# Patient Record
Sex: Female | Born: 1950
Health system: Southern US, Community
[De-identification: ages and names within clinical notes are randomized; demographics above are authoritative.]

## PROBLEM LIST (undated history)

## (undated) DIAGNOSIS — K859 Acute pancreatitis without necrosis or infection, unspecified: Secondary | ICD-10-CM

## (undated) DIAGNOSIS — Z8709 Personal history of other diseases of the respiratory system: Secondary | ICD-10-CM

## (undated) DIAGNOSIS — Z8719 Personal history of other diseases of the digestive system: Secondary | ICD-10-CM

## (undated) DIAGNOSIS — M1990 Unspecified osteoarthritis, unspecified site: Secondary | ICD-10-CM

## (undated) DIAGNOSIS — T7840XA Allergy, unspecified, initial encounter: Secondary | ICD-10-CM

## (undated) DIAGNOSIS — E119 Type 2 diabetes mellitus without complications: Secondary | ICD-10-CM

## (undated) DIAGNOSIS — G4730 Sleep apnea, unspecified: Secondary | ICD-10-CM

## (undated) DIAGNOSIS — W19XXXA Unspecified fall, initial encounter: Secondary | ICD-10-CM

## (undated) DIAGNOSIS — K219 Gastro-esophageal reflux disease without esophagitis: Secondary | ICD-10-CM

## (undated) DIAGNOSIS — M8580 Other specified disorders of bone density and structure, unspecified site: Secondary | ICD-10-CM

## (undated) DIAGNOSIS — F32A Depression, unspecified: Secondary | ICD-10-CM

## (undated) DIAGNOSIS — F329 Major depressive disorder, single episode, unspecified: Secondary | ICD-10-CM

## (undated) DIAGNOSIS — Z8781 Personal history of (healed) traumatic fracture: Secondary | ICD-10-CM

## (undated) DIAGNOSIS — F419 Anxiety disorder, unspecified: Secondary | ICD-10-CM

## (undated) DIAGNOSIS — Z973 Presence of spectacles and contact lenses: Secondary | ICD-10-CM

## (undated) DIAGNOSIS — R296 Repeated falls: Secondary | ICD-10-CM

## (undated) DIAGNOSIS — K635 Polyp of colon: Secondary | ICD-10-CM

## (undated) DIAGNOSIS — E785 Hyperlipidemia, unspecified: Secondary | ICD-10-CM

## (undated) DIAGNOSIS — I10 Essential (primary) hypertension: Secondary | ICD-10-CM

## (undated) DIAGNOSIS — K222 Esophageal obstruction: Secondary | ICD-10-CM

## (undated) DIAGNOSIS — R351 Nocturia: Secondary | ICD-10-CM

## (undated) HISTORY — DX: Sleep apnea, unspecified: G47.30

## (undated) HISTORY — PX: UPPER GI ENDOSCOPY: SHX6162

## (undated) HISTORY — DX: Esophageal obstruction: K22.2

## (undated) HISTORY — DX: Major depressive disorder, single episode, unspecified: F32.9

## (undated) HISTORY — PX: DILATION AND CURETTAGE OF UTERUS: SHX78

## (undated) HISTORY — DX: Anxiety disorder, unspecified: F41.9

## (undated) HISTORY — PX: BLADDER REPAIR: SHX76

## (undated) HISTORY — PX: JOINT REPLACEMENT: SHX530

## (undated) HISTORY — DX: Hyperlipidemia, unspecified: E78.5

## (undated) HISTORY — DX: Type 2 diabetes mellitus without complications: E11.9

## (undated) HISTORY — PX: DIAGNOSTIC LAPAROSCOPY: SUR761

## (undated) HISTORY — PX: COLONOSCOPY: SHX174

## (undated) HISTORY — DX: Depression, unspecified: F32.A

## (undated) HISTORY — DX: Gastro-esophageal reflux disease without esophagitis: K21.9

## (undated) HISTORY — DX: Acute pancreatitis without necrosis or infection, unspecified: K85.90

## (undated) HISTORY — DX: Allergy, unspecified, initial encounter: T78.40XA

## (undated) HISTORY — PX: MULTIPLE TOOTH EXTRACTIONS: SHX2053

## (undated) HISTORY — PX: TUBAL LIGATION: SHX77

## (undated) HISTORY — PX: TONSILLECTOMY: SUR1361

## (undated) HISTORY — DX: Essential (primary) hypertension: I10

## (undated) HISTORY — PX: ESOPHAGEAL DILATION: SHX303

## (undated) HISTORY — DX: Other specified disorders of bone density and structure, unspecified site: M85.80

## (undated) HISTORY — PX: UPPER GASTROINTESTINAL ENDOSCOPY: SHX188

---

## 1998-10-26 ENCOUNTER — Other Ambulatory Visit: Admission: RE | Admit: 1998-10-26 | Discharge: 1998-10-26 | Payer: Self-pay | Admitting: Family Medicine

## 1998-12-01 ENCOUNTER — Encounter: Admission: RE | Admit: 1998-12-01 | Discharge: 1998-12-01 | Payer: Self-pay | Admitting: Family Medicine

## 1998-12-01 ENCOUNTER — Encounter: Payer: Self-pay | Admitting: Family Medicine

## 1999-11-24 ENCOUNTER — Other Ambulatory Visit: Admission: RE | Admit: 1999-11-24 | Discharge: 1999-11-24 | Payer: Self-pay | Admitting: Family Medicine

## 1999-12-29 ENCOUNTER — Encounter: Admission: RE | Admit: 1999-12-29 | Discharge: 1999-12-29 | Payer: Self-pay | Admitting: Family Medicine

## 1999-12-29 ENCOUNTER — Encounter: Payer: Self-pay | Admitting: Family Medicine

## 2000-01-02 DIAGNOSIS — K5730 Diverticulosis of large intestine without perforation or abscess without bleeding: Secondary | ICD-10-CM | POA: Insufficient documentation

## 2001-01-09 ENCOUNTER — Encounter: Payer: Self-pay | Admitting: Family Medicine

## 2001-01-09 ENCOUNTER — Encounter: Admission: RE | Admit: 2001-01-09 | Discharge: 2001-01-09 | Payer: Self-pay | Admitting: Family Medicine

## 2001-12-05 ENCOUNTER — Other Ambulatory Visit: Admission: RE | Admit: 2001-12-05 | Discharge: 2001-12-05 | Payer: Self-pay | Admitting: Family Medicine

## 2002-01-10 ENCOUNTER — Encounter: Payer: Self-pay | Admitting: Family Medicine

## 2002-01-10 ENCOUNTER — Encounter: Admission: RE | Admit: 2002-01-10 | Discharge: 2002-01-10 | Payer: Self-pay | Admitting: Family Medicine

## 2002-04-15 ENCOUNTER — Encounter: Payer: Self-pay | Admitting: Family Medicine

## 2002-04-15 ENCOUNTER — Encounter: Admission: RE | Admit: 2002-04-15 | Discharge: 2002-04-15 | Payer: Self-pay | Admitting: Family Medicine

## 2002-05-16 ENCOUNTER — Encounter: Payer: Self-pay | Admitting: Gastroenterology

## 2002-05-16 ENCOUNTER — Ambulatory Visit (HOSPITAL_COMMUNITY): Admission: RE | Admit: 2002-05-16 | Discharge: 2002-05-16 | Payer: Self-pay | Admitting: Gastroenterology

## 2003-02-27 ENCOUNTER — Encounter: Admission: RE | Admit: 2003-02-27 | Discharge: 2003-02-27 | Payer: Self-pay | Admitting: Family Medicine

## 2003-03-03 ENCOUNTER — Other Ambulatory Visit: Admission: RE | Admit: 2003-03-03 | Discharge: 2003-03-03 | Payer: Self-pay | Admitting: Family Medicine

## 2003-12-22 ENCOUNTER — Ambulatory Visit: Payer: Self-pay | Admitting: Family Medicine

## 2004-01-31 DIAGNOSIS — Z8781 Personal history of (healed) traumatic fracture: Secondary | ICD-10-CM

## 2004-01-31 HISTORY — DX: Personal history of (healed) traumatic fracture: Z87.81

## 2004-03-02 ENCOUNTER — Encounter: Admission: RE | Admit: 2004-03-02 | Discharge: 2004-03-02 | Payer: Self-pay | Admitting: Family Medicine

## 2004-04-04 ENCOUNTER — Other Ambulatory Visit: Admission: RE | Admit: 2004-04-04 | Discharge: 2004-04-04 | Payer: Self-pay | Admitting: Family Medicine

## 2004-04-04 ENCOUNTER — Ambulatory Visit: Payer: Self-pay | Admitting: Family Medicine

## 2004-04-19 ENCOUNTER — Ambulatory Visit: Payer: Self-pay | Admitting: Family Medicine

## 2004-11-19 ENCOUNTER — Emergency Department: Payer: Self-pay | Admitting: General Practice

## 2005-03-07 ENCOUNTER — Ambulatory Visit: Payer: Self-pay | Admitting: Gastroenterology

## 2005-03-21 ENCOUNTER — Ambulatory Visit: Payer: Self-pay | Admitting: Gastroenterology

## 2005-03-21 LAB — HM COLONOSCOPY: HM Colonoscopy: NORMAL

## 2005-03-27 ENCOUNTER — Encounter: Admission: RE | Admit: 2005-03-27 | Discharge: 2005-03-27 | Payer: Self-pay | Admitting: Family Medicine

## 2005-04-13 ENCOUNTER — Other Ambulatory Visit: Admission: RE | Admit: 2005-04-13 | Discharge: 2005-04-13 | Payer: Self-pay | Admitting: Family Medicine

## 2005-04-13 ENCOUNTER — Ambulatory Visit: Payer: Self-pay | Admitting: Family Medicine

## 2005-04-13 ENCOUNTER — Encounter: Payer: Self-pay | Admitting: Family Medicine

## 2005-04-25 ENCOUNTER — Encounter: Payer: Self-pay | Admitting: Family Medicine

## 2005-04-27 ENCOUNTER — Ambulatory Visit: Payer: Self-pay | Admitting: Family Medicine

## 2005-06-13 ENCOUNTER — Ambulatory Visit: Payer: Self-pay | Admitting: Family Medicine

## 2005-07-04 ENCOUNTER — Ambulatory Visit: Payer: Self-pay | Admitting: Gastroenterology

## 2005-07-14 ENCOUNTER — Ambulatory Visit: Payer: Self-pay | Admitting: Gastroenterology

## 2005-09-22 ENCOUNTER — Ambulatory Visit: Payer: Self-pay | Admitting: Family Medicine

## 2006-01-01 ENCOUNTER — Ambulatory Visit: Payer: Self-pay | Admitting: Family Medicine

## 2006-03-29 ENCOUNTER — Ambulatory Visit: Payer: Self-pay | Admitting: Family Medicine

## 2006-03-29 LAB — CONVERTED CEMR LAB
Alkaline Phosphatase: 74 units/L (ref 39–117)
Direct LDL: 141.1 mg/dL
Total CHOL/HDL Ratio: 4.2
Triglycerides: 195 mg/dL — ABNORMAL HIGH (ref 0–149)

## 2006-04-02 ENCOUNTER — Ambulatory Visit: Payer: Self-pay | Admitting: Family Medicine

## 2006-04-18 ENCOUNTER — Encounter: Payer: Self-pay | Admitting: Family Medicine

## 2006-04-18 DIAGNOSIS — K644 Residual hemorrhoidal skin tags: Secondary | ICD-10-CM | POA: Insufficient documentation

## 2006-04-18 DIAGNOSIS — R748 Abnormal levels of other serum enzymes: Secondary | ICD-10-CM | POA: Insufficient documentation

## 2006-04-18 DIAGNOSIS — R319 Hematuria, unspecified: Secondary | ICD-10-CM | POA: Insufficient documentation

## 2006-04-18 DIAGNOSIS — F418 Other specified anxiety disorders: Secondary | ICD-10-CM | POA: Insufficient documentation

## 2006-04-18 DIAGNOSIS — Z8619 Personal history of other infectious and parasitic diseases: Secondary | ICD-10-CM

## 2006-04-18 DIAGNOSIS — Z8719 Personal history of other diseases of the digestive system: Secondary | ICD-10-CM | POA: Insufficient documentation

## 2006-04-18 DIAGNOSIS — N318 Other neuromuscular dysfunction of bladder: Secondary | ICD-10-CM | POA: Insufficient documentation

## 2006-04-18 DIAGNOSIS — E785 Hyperlipidemia, unspecified: Secondary | ICD-10-CM | POA: Insufficient documentation

## 2006-04-18 DIAGNOSIS — K219 Gastro-esophageal reflux disease without esophagitis: Secondary | ICD-10-CM

## 2006-04-18 DIAGNOSIS — K449 Diaphragmatic hernia without obstruction or gangrene: Secondary | ICD-10-CM | POA: Insufficient documentation

## 2006-06-01 ENCOUNTER — Encounter: Admission: RE | Admit: 2006-06-01 | Discharge: 2006-06-01 | Payer: Self-pay | Admitting: Family Medicine

## 2006-06-04 ENCOUNTER — Encounter: Payer: Self-pay | Admitting: Family Medicine

## 2006-07-05 ENCOUNTER — Ambulatory Visit: Payer: Self-pay | Admitting: Family Medicine

## 2006-07-06 LAB — CONVERTED CEMR LAB
AST: 29 units/L (ref 0–37)
Direct LDL: 128.9 mg/dL
Total CHOL/HDL Ratio: 4.4
Triglycerides: 189 mg/dL — ABNORMAL HIGH (ref 0–149)

## 2006-08-22 ENCOUNTER — Ambulatory Visit: Payer: Self-pay | Admitting: Family Medicine

## 2006-08-22 DIAGNOSIS — N39498 Other specified urinary incontinence: Secondary | ICD-10-CM | POA: Insufficient documentation

## 2006-09-13 ENCOUNTER — Encounter: Payer: Self-pay | Admitting: Family Medicine

## 2006-10-04 ENCOUNTER — Ambulatory Visit: Payer: Self-pay | Admitting: Family Medicine

## 2006-10-05 ENCOUNTER — Ambulatory Visit: Payer: Self-pay | Admitting: Internal Medicine

## 2006-10-05 ENCOUNTER — Encounter: Payer: Self-pay | Admitting: Family Medicine

## 2006-10-08 LAB — CONVERTED CEMR LAB
ALT: 30 units/L (ref 0–35)
AST: 27 units/L (ref 0–37)
Triglycerides: 146 mg/dL (ref 0–149)
VLDL: 29 mg/dL (ref 0–40)

## 2006-10-22 DIAGNOSIS — M899 Disorder of bone, unspecified: Secondary | ICD-10-CM

## 2006-10-22 DIAGNOSIS — M949 Disorder of cartilage, unspecified: Secondary | ICD-10-CM

## 2006-12-03 ENCOUNTER — Telehealth: Payer: Self-pay | Admitting: Family Medicine

## 2006-12-07 ENCOUNTER — Ambulatory Visit: Payer: Self-pay | Admitting: Family Medicine

## 2006-12-25 ENCOUNTER — Ambulatory Visit (HOSPITAL_BASED_OUTPATIENT_CLINIC_OR_DEPARTMENT_OTHER): Admission: RE | Admit: 2006-12-25 | Discharge: 2006-12-25 | Payer: Self-pay | Admitting: Urology

## 2007-04-04 ENCOUNTER — Telehealth: Payer: Self-pay | Admitting: Family Medicine

## 2007-07-26 ENCOUNTER — Encounter: Admission: RE | Admit: 2007-07-26 | Discharge: 2007-07-26 | Payer: Self-pay | Admitting: Family Medicine

## 2007-07-30 ENCOUNTER — Encounter (INDEPENDENT_AMBULATORY_CARE_PROVIDER_SITE_OTHER): Payer: Self-pay | Admitting: *Deleted

## 2007-10-22 ENCOUNTER — Telehealth: Payer: Self-pay | Admitting: Family Medicine

## 2007-11-05 ENCOUNTER — Ambulatory Visit: Payer: Self-pay | Admitting: Family Medicine

## 2008-01-16 ENCOUNTER — Ambulatory Visit: Payer: Self-pay | Admitting: Family Medicine

## 2008-04-02 ENCOUNTER — Telehealth: Payer: Self-pay | Admitting: Family Medicine

## 2008-04-16 ENCOUNTER — Ambulatory Visit: Payer: Self-pay | Admitting: Family Medicine

## 2008-04-16 ENCOUNTER — Other Ambulatory Visit: Admission: RE | Admit: 2008-04-16 | Discharge: 2008-04-16 | Payer: Self-pay | Admitting: Family Medicine

## 2008-04-16 ENCOUNTER — Encounter: Payer: Self-pay | Admitting: Family Medicine

## 2008-04-16 ENCOUNTER — Telehealth: Payer: Self-pay | Admitting: Gastroenterology

## 2008-04-17 DIAGNOSIS — K222 Esophageal obstruction: Secondary | ICD-10-CM

## 2008-04-17 LAB — CONVERTED CEMR LAB
ALT: 40 units/L — ABNORMAL HIGH (ref 0–35)
AST: 37 units/L (ref 0–37)
BUN: 10 mg/dL (ref 6–23)
Direct LDL: 132.2 mg/dL
Eosinophils Relative: 2.5 % (ref 0.0–5.0)
GFR calc non Af Amer: 60.17 mL/min (ref >60)
HCT: 40.1 % (ref 36.0–46.0)
HDL: 59.4 mg/dL (ref >39.00)
Hemoglobin: 13.7 g/dL (ref 12.0–15.0)
Lymphocytes Relative: 43 % (ref 12.0–46.0)
MCHC: 34.1 g/dL (ref 30.0–36.0)
MCV: 89.7 fL (ref 78.0–100.0)
RBC: 4.47 M/uL (ref 3.87–5.11)
Total Bilirubin: 0.7 mg/dL (ref 0.3–1.2)
Total Protein: 7.5 g/dL (ref 6.0–8.3)
WBC: 4.6 10*3/uL (ref 4.5–10.5)

## 2008-04-20 ENCOUNTER — Ambulatory Visit: Payer: Self-pay | Admitting: Gastroenterology

## 2008-04-20 LAB — CONVERTED CEMR LAB: Vit D, 25-Hydroxy: 31 ng/mL (ref 30–89)

## 2008-04-21 ENCOUNTER — Encounter (INDEPENDENT_AMBULATORY_CARE_PROVIDER_SITE_OTHER): Payer: Self-pay | Admitting: *Deleted

## 2008-04-23 ENCOUNTER — Encounter: Payer: Self-pay | Admitting: Gastroenterology

## 2008-04-30 ENCOUNTER — Ambulatory Visit: Payer: Self-pay | Admitting: Gastroenterology

## 2008-07-16 ENCOUNTER — Ambulatory Visit: Payer: Self-pay | Admitting: Family Medicine

## 2008-07-17 LAB — CONVERTED CEMR LAB
ALT: 22 units/L (ref 0–35)
Cholesterol: 192 mg/dL (ref 0–200)
Glucose, Bld: 96 mg/dL (ref 70–99)

## 2008-10-16 ENCOUNTER — Ambulatory Visit: Payer: Self-pay | Admitting: Internal Medicine

## 2008-10-16 ENCOUNTER — Encounter: Payer: Self-pay | Admitting: Family Medicine

## 2008-10-16 LAB — CONVERTED CEMR LAB
Basophils Relative: 0.2 % (ref 0.0–3.0)
CRP, High Sensitivity: 2.4 (ref 0.00–5.00)
Eosinophils Relative: 2.4 % (ref 0.0–5.0)
HCT: 41.1 % (ref 36.0–46.0)
Hemoglobin: 13.9 g/dL (ref 12.0–15.0)
MCV: 90.7 fL (ref 78.0–100.0)
Monocytes Relative: 8.5 % (ref 3.0–12.0)
Neutro Abs: 2.8 10*3/uL (ref 1.4–7.7)
Platelets: 230 10*3/uL (ref 150.0–400.0)
RDW: 13.3 % (ref 11.5–14.6)
Uric Acid, Serum: 4.9 mg/dL (ref 2.4–7.0)
WBC: 5.3 10*3/uL (ref 4.5–10.5)

## 2008-11-10 ENCOUNTER — Ambulatory Visit: Payer: Self-pay | Admitting: Family Medicine

## 2008-11-13 ENCOUNTER — Encounter: Admission: RE | Admit: 2008-11-13 | Discharge: 2008-11-13 | Payer: Self-pay | Admitting: Family Medicine

## 2008-11-13 ENCOUNTER — Ambulatory Visit: Payer: Self-pay | Admitting: Internal Medicine

## 2008-11-13 ENCOUNTER — Encounter: Payer: Self-pay | Admitting: Family Medicine

## 2008-11-17 ENCOUNTER — Encounter (INDEPENDENT_AMBULATORY_CARE_PROVIDER_SITE_OTHER): Payer: Self-pay | Admitting: *Deleted

## 2008-12-07 ENCOUNTER — Encounter (INDEPENDENT_AMBULATORY_CARE_PROVIDER_SITE_OTHER): Payer: Self-pay | Admitting: *Deleted

## 2009-05-03 ENCOUNTER — Encounter: Payer: Self-pay | Admitting: Family Medicine

## 2009-05-03 ENCOUNTER — Telehealth: Payer: Self-pay | Admitting: Family Medicine

## 2009-08-19 ENCOUNTER — Ambulatory Visit: Payer: Self-pay | Admitting: Family Medicine

## 2009-09-10 ENCOUNTER — Encounter: Payer: Self-pay | Admitting: Family Medicine

## 2009-10-15 ENCOUNTER — Encounter: Payer: Self-pay | Admitting: Family Medicine

## 2009-11-24 ENCOUNTER — Ambulatory Visit: Payer: Self-pay | Admitting: Family Medicine

## 2009-12-27 ENCOUNTER — Telehealth: Payer: Self-pay | Admitting: Family Medicine

## 2010-01-05 ENCOUNTER — Telehealth: Payer: Self-pay | Admitting: Family Medicine

## 2010-01-05 ENCOUNTER — Ambulatory Visit: Payer: Self-pay | Admitting: Family Medicine

## 2010-01-19 ENCOUNTER — Ambulatory Visit: Payer: Self-pay | Admitting: Family Medicine

## 2010-01-25 ENCOUNTER — Ambulatory Visit
Admission: RE | Admit: 2010-01-25 | Discharge: 2010-01-25 | Payer: Self-pay | Source: Home / Self Care | Attending: Internal Medicine | Admitting: Internal Medicine

## 2010-03-01 NOTE — Assessment & Plan Note (Signed)
Summary: hip pain/alc   Vital Signs:  Patient profile:   60 year old female Height:      65.5 inches Weight:      179.50 pounds BMI:     29.52 Temp:     98.3 degrees F oral Pulse rate:   88 / minute Pulse rhythm:   regular BP sitting:   130 / 84  (left arm) Cuff size:   regular  Vitals Entered By: Lewanda Rife LPN (August 19, 2009 10:21 AM) CC: Rt hip pain started in Feb. Pain on and off. Sometimes pain is sharp and sometimes dull. Now pain level is 5.   History of Present Illness: r hip started hurting bad to walk with feeling of instability  some groint pain or buttock pain (less) also gets numb down to ankle at times varies day to day better in the spring and then worse again  no new exercise is caring for a new grandchild   sometimes when she walks - leg feels a bit weak  no problems with back in her past  hip problems all of her life- hips hurt with pregnancy has some arthritis in hands   never had x ray   has been taking some 600 mg ibup- no GI problems , take 3 times per day not helping a whole lot   is very stiff after inactivity  Allergies (verified): No Known Drug Allergies  Past History:  Past Medical History: Last updated: 05/02/2008 Depression GERD Hyperlipidemia Pancreatitis, hx of osteopenia esophageal stricture   GI- Dr Arlyce Dice  Past Surgical History: Last updated: 05/02/2008 Tubal ligation Tonsillectomy EGD 09/99, 01/02, 06/07 colonoscopy 11/01, 02/07 DEXA 12/02,  02/05 Fractured shoulder 2006 CT of abd, pelvis 05/04- negative ultrasound 03/04- fatty liver Bladder Repair 3/10 EGD- esoph stricture/dilated   Family History: Last updated: Sep 06, 2006 Father: Deceased, cirrhosis of the liver Mother: Colon cancer, HTN, CVA Siblings: One sister, three brothers son- drug addiction  Social History: Last updated: 04/20/2008 Marital Status: Married Children:1  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 Eyes:  Denies blurring and eye irritation. CV:  Denies chest pain or discomfort, lightheadness, and palpitations. Resp:  Denies cough, shortness of breath, and wheezing. GI:  Denies abdominal pain, diarrhea, indigestion, and nausea. MS:  Complains of joint pain, low back pain, and stiffness; denies joint redness, joint swelling, and muscle weakness. Derm:  Denies itching, lesion(s), poor wound healing, and rash. Neuro:  Denies numbness and tingling. Psych:  Denies anxiety and depression. Heme:  Denies abnormal bruising.  Physical Exam  General:  overweight but generally well appearing  Head:  normocephalic, atraumatic, and no abnormalities observed.   Mouth:  pharynx pink and moist.   Neck:  supple with full rom and no masses or thyromegally, no JVD or carotid bruit  Lungs:  Normal respiratory effort, chest expands symmetrically. Lungs are clear to auscultation, no crackles or wheezes. Heart:  Normal rate and regular rhythm. S1 and S2 normal without gallop, murmur, click, rub or other extra sounds. Abdomen:  no suprapubic tenderness or fullness felt  Msk:  R hip - no troch tenderness painful to flex and int/ ext rotate with pain in groin area slr causes buttock but not leg pain on that side  L hip nl  some mild R perilumbar muscular tenderness over SI area  gait favors  the L leg nl rom LS  Pulses:  R and L carotid,radial,femoral,dorsalis pedis and posterior tibial pulses are full and equal bilaterally Extremities:  No clubbing, cyanosis, edema, or deformity noted with normal full range of motion of all joints.   Neurologic:  strength normal in all extremities, sensation intact to light touch, gait normal, and DTRs symmetrical and normal.   Skin:  Intact without suspicious lesions or rashes Inguinal Nodes:  No significant adenopathy Psych:   normal affect, talkative and pleasant    Impression & Recommendations:  Problem # 1:  HIP PAIN, RIGHT (ICD-719.45) Assessment New with pain on flexation and rotation- with rad to groin and no troch tenderness suspect poss hip OA x ray today and update  change nsaid to mobic in meantime to see if this helps more  rec relative rest - then will rec further  The following medications were removed from the medication list:    Ibuprofen 600 Mg Tabs (Ibuprofen) ..... One tablet by mouth every eight hours as needed for pain. Her updated medication list for this problem includes:    Mobic 15 Mg Tabs (Meloxicam) .Marland Kitchen... 1 by mouth once daily with food as needed pain - stop if any gi upset  Orders: T-Hip Comp Right Min 2 views (73510TC) T-Lumbar Spine 2 Views (72100TC) Prescription Created Electronically [phone])  Problem # 2:  BACK PAIN, LUMBAR (ICD-724.2) Assessment: New this is mild and seems to be assoc with the hip pain- but also with some poss radicular symptoms nl rom  will x ray LS as well and udate The following medications were removed from the medication list:    Ibuprofen 600 Mg Tabs (Ibuprofen) ..... One tablet by mouth every eight hours as needed for pain. Her updated medication list for this problem includes:    Mobic 15 Mg Tabs (Meloxicam) .Marland Kitchen... 1 by mouth once daily with food as needed pain - stop if any gi upset  Orders: T-Hip Comp Right Min 2 views (73510TC) T-Lumbar Spine 2 Views (72100TC) Prescription Created Electronically [phone])  Complete Medication List: 1)  Prozac 20 Mg Caps (Fluoxetine hcl) .Marland Kitchen.. 1 by mouth once daily 2)  Pravachol 80 Mg Tabs (Pravastatin sodium) .Marland Kitchen.. 1 by mouth once daily 3)  Calcium 500/d 500-200 Mg-unit Tabs (Calcium carbonate-vitamin d) .... Once daily 4)  Benadryl Allergy/sinus Headach 12.5-5-325 Mg Tabs (Diphenhydramine-pe-apap) .... At bedtime 5)  Vitamin D (ergocalciferol) 50000 Unit Caps (Ergocalciferol) .... Take 1 by mouth once  weekly 6)  Protonix 40 Mg Tbec (Pantoprazole sodium) .... Take 1 tablet by mouth once a day 7)  Mobic 15 Mg Tabs (Meloxicam) .Marland Kitchen.. 1 by mouth once daily with food as needed pain - stop if any gi upset  Patient Instructions: 1)  x rays today on way out  2)  we will call to update you with results  3)  use mobic for pain with food (stop the ibuprofen) Prescriptions: MOBIC 15 MG TABS (MELOXICAM) 1 by mouth once daily with food as needed pain - stop if any GI upset  #30 x 1   Entered and Authorized by:   Judith Part MD   Signed by:   Judith Part MD on 08/19/2009   Method used:   Electronically to        [facility] [address] (retail)       [address], [address]       [place], [place]  [address]  Ph: [phone]       Fax: [phone]   RxID:   [id number]   Current Allergies (reviewed today): No known allergies

## 2010-03-01 NOTE — Assessment & Plan Note (Signed)
Summary: ?SINUS INFECTION/CLE   Vital Signs:  Patient profile:   60 year old female Height:      65.5 inches Weight:      176.75 pounds BMI:     29.07 Temp:     97.7 degrees F oral Pulse rate:   84 / minute Pulse rhythm:   regular BP sitting:   136 / 82  (left arm) Cuff size:   regular  Vitals Entered By: Lewanda Rife LPN (November 24, 2009 10:22 AM) CC: ?sinus infection, head congested and nonproductive cough   History of Present Illness: started symptoms 1 week ago (grandson had it) now is dizzy  has a hard / deep cough - non prod  this am R side of face is hurting and pressure  really congested -- cannot get out  all post nasal drip- has a yellow/ green color  no fever now  a little yesterday - got hot   otc cough med not helping   Allergies (verified): No Known Drug Allergies  Past History:  Past Medical History: Last updated: 05/02/2008 Depression GERD Hyperlipidemia Pancreatitis, hx of osteopenia esophageal stricture   GI- Dr Arlyce Dice  Past Surgical History: Last updated: 05/02/2008 Tubal ligation Tonsillectomy EGD 09/99, 01/02, 06/07 colonoscopy 11/01, 02/07 DEXA 12/02,  02/05 Fractured shoulder 2006 CT of abd, pelvis 05/04- negative ultrasound 03/04- fatty liver Bladder Repair 3/10 EGD- esoph stricture/dilated   Family History: Last updated: August 24, 2006 Father: Deceased, cirrhosis of the liver Mother: Colon cancer, HTN, CVA Siblings: One sister, three brothers son- drug addiction  Social History: Last updated: 04/20/2008 Marital Status: Married Children:1  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 Eyes:  Denies blurring and eye irritation. ENT:  Complains of nasal congestion, postnasal drainage, sinus pressure, and sore throat. CV:  Denies chest  pain or discomfort and lightheadness. Resp:  Complains of cough and sputum productive; denies pleuritic and shortness of breath. GI:  Denies abdominal pain, diarrhea, nausea, and vomiting. Derm:  Denies rash.  Physical Exam  General:  overweight but generally well appearing  Head:  mild max sinus tenderness normocephalic, atraumatic, and no abnormalities observed.   Eyes:  vision grossly intact, pupils equal, pupils round, pupils reactive to light, and no injection.   Ears:  R ear normal and L ear normal.   Nose:  nares are injected and congested bilaterally  Mouth:  pharynx pink and moist, no erythema, and no exudates.  some clear post nasal drip  Neck:  supple with full rom and no masses or thyromegally, no JVD or carotid bruit  Chest Wall:  No deformities, masses, or tenderness noted. Lungs:  harsh bs throughout with wheeze on forced exp occ faint rhonchi, no rales  Heart:  Normal rate and regular rhythm. S1 and S2 normal without gallop, murmur, click, rub or other extra sounds. Skin:  Intact without suspicious lesions or rashes Cervical Nodes:  No lymphadenopathy noted Psych:  normal affect, talkative and pleasant    Impression & Recommendations:  Problem # 1:  BRONCHITIS- ACUTE (ICD-466.0) Assessment New  with worsening cough after a week will cover with zithromax  chertussin as needed with caution proair if wheeze recommend sympt care- see pt instructions   pt advised to update me if symptoms worsen or do not improve  Her updated medication list for this problem  includes:    Benadryl Allergy/sinus Headach 12.5-5-325 Mg Tabs (Diphenhydramine-pe-apap) .Marland Kitchen... At bedtime    Zithromax Z-pak 250 Mg Tabs (Azithromycin) .Marland Kitchen... Take by mouth as directed    Cheratussin Ac 100-10 Mg/48ml Syrp (Guaifenesin-codeine) .Marland Kitchen... 1-2 teaspoons by mouth up to every 4-6 hours as needed cough    Proair Hfa 108 (90 Base) Mcg/act Aers (Albuterol sulfate) .Marland Kitchen... 2 puffs up to every 4 hours as needed  wheeze  Orders: Prescription Created Electronically [phone])  Complete Medication List: 1)  Prozac 20 Mg Caps (Fluoxetine hcl) .Marland Kitchen.. 1 by mouth once daily 2)  Pravachol 80 Mg Tabs (Pravastatin sodium) .Marland Kitchen.. 1 by mouth once daily 3)  Calcium 500/d 500-200 Mg-unit Tabs (Calcium carbonate-vitamin d) .... Once daily 4)  Benadryl Allergy/sinus Headach 12.5-5-325 Mg Tabs (Diphenhydramine-pe-apap) .... At bedtime 5)  Vitamin D (ergocalciferol) 50000 Unit Caps (Ergocalciferol) .... Take 1 by mouth once weekly 6)  Protonix 40 Mg Tbec (Pantoprazole sodium) .... Take 1 tablet by mouth once a day 7)  Mobic 15 Mg Tabs (Meloxicam) .Marland Kitchen.. 1 by mouth once daily with food as needed pain - stop if any gi upset 8)  Zithromax Z-pak 250 Mg Tabs (Azithromycin) .... Take by mouth as directed 9)  Cheratussin Ac 100-10 Mg/61ml Syrp (Guaifenesin-codeine) .Marland Kitchen.. 1-2 teaspoons by mouth up to every 4-6 hours as needed cough 10)  Proair Hfa 108 (90 Base) Mcg/act Aers (Albuterol sulfate) .... 2 puffs up to every 4 hours as needed wheeze  Patient Instructions: 1)  nasal saline spray for congestion 2)  tylenol over the counter as directed may help with aches, headache and fever 3)  call if symptoms worsen or if not improved in 4-5 days  4)  take zithromax as directed  5)  use chertussin with caution for cough  6)  use the inhaler only if you need it for wheezing  Prescriptions: PROAIR HFA 108 (90 BASE) MCG/ACT AERS (ALBUTEROL SULFATE) 2 puffs up to every 4 hours as needed wheeze  #1 mdi x 0   Entered and Authorized by:   Judith Part MD   Signed by:   Judith Part MD on 11/24/2009   Method used:   Print then Give to Patient   RxID:   [id number] CHERATUSSIN AC 100-10 MG/5ML SYRP (GUAIFENESIN-CODEINE) 1-2 teaspoons by mouth up to every 4-6 hours as needed cough  #120cc x 0   Entered and Authorized by:   Judith Part MD   Signed by:   Judith Part MD on 11/24/2009   Method used:   Print then Give to Patient    RxID:   [id number] ZITHROMAX Z-PAK 250 MG TABS (AZITHROMYCIN) take by mouth as directed  #1 pack x 0   Entered and Authorized by:   Judith Part MD   Signed by:   Judith Part MD on 11/24/2009   Method used:   Electronically to        [facility] [address] (retail)       [address], [address]       [place], [place]  [address]       Ph: [phone]       Fax: [phone]   RxID:   [id number]    Orders Added: 1)  Est. Patient Level III [[address]] 2)  Prescription Created Electronically [phone]    Current Allergies (reviewed today): No known allergies

## 2010-03-01 NOTE — Assessment & Plan Note (Signed)
Summary: FLU SHOT / LFW   Nurse Visit   Allergies: No Known Drug Allergies  Orders Added: 1)  Admin 1st Vaccine [90471] 2)  Flu Vaccine 78yrs + [94174]          Flu Vaccine Consent Questions     Do you have a history of severe allergic reactions to this vaccine? no    Any prior history of allergic reactions to egg and/or gelatin? no    Do you have a sensitivity to the preservative Thimersol? no    Do you have a past history of Guillan-Barre Syndrome? no    Do you currently have an acute febrile illness? no    Have you ever had a severe reaction to latex? no    Vaccine information given and explained to patient? yes    Are you currently pregnant? no    Lot Number:[id number]   Exp Date:07/30/2010   Site Given  Left Deltoid IM Lewanda Rife LPN  January 05, 2010 10:19 AM

## 2010-03-01 NOTE — Progress Notes (Signed)
Summary: Prozac and Protonix refill  Phone Note Refill Request Call back at [phone] Message from:  [facility] [address] on May 03, 2009 10:48 AM  Refills Requested: Medication #1:  PROZAC 20 MG CAPS 1 by mouth once daily   Last Refilled: 03/30/2009 [facility] [address] request refill on Prozac and Protonix 40mg  take one by mouth each am. Lat refilled 03/30/2009. Protonix is not on med list. Dr. Arlyce Dice put on Zegerid 04/20/08. Please advise.    Method Requested: Telephone to Pharmacy Initial call taken by: Lewanda Rife LPN,  May 03, 2009 10:50 AM  Follow-up for Phone Call        px written on EMR for call in for prozac  clarify protonix issue- I think she is no longer on it   Follow-up by: Judith Part MD,  May 03, 2009 11:39 AM  Additional Follow-up for Phone Call Additional follow up Details #1::        Pt said she stopped Zegerid because needs generic med and Pantoprazole 40mg  works good for her and is less expensive. I added to med list. Lewanda Rife LPN  May 04, 4538 12:02 PM   thanks px written on EMR for call in  Additional Follow-up by: Judith Part MD,  May 03, 2009 12:15 PM    Additional Follow-up for Phone Call Additional follow up Details #2::    Medication phoned to [facility] [address] pharmacy as instructed. Lewanda Rife LPN  May 03, 9809 2:55 PM   New/Updated Medications: PROZAC 20 MG CAPS (FLUOXETINE HCL) 1 by mouth once daily Prescriptions: PROTONIX 40 MG TBEC (PANTOPRAZOLE SODIUM) Take 1 tablet by mouth once a day  #30 x 11   Entered and Authorized by:   Judith Part MD   Signed by:   Lewanda Rife LPN on 91/47/8295   Method used:   Telephoned to ...       [facility] [address] ([facility]ail)       [address]       [place], [place]  [address]       Ph: [phone]       Fax: [phone]   RxID:   [id number] PROZAC 20 MG CAPS (FLUOXETINE HCL) 1 by mouth once daily  #30 x 11   Entered and Authorized by:    Judith Part MD   Signed by:   Lewanda Rife LPN on 42/59/5638   Method used:   Telephoned to ...       [facility] [address] ([facility]ail)       [address]       [place], [place]  [address]       Ph: [phone]       Fax: [phone]   RxID:   [id number]

## 2010-03-01 NOTE — Letter (Signed)
Summary: [facility]  [facility]   Imported By: Lanelle Bal 10/26/2009 08:50:31  _____________________________________________________________________  External Attachment:    Type:   Image     Comment:   External Document

## 2010-03-01 NOTE — Letter (Signed)
Summary: [facility]  [facility]   Imported By: Maryln Gottron 09/17/2009 10:11:26  _____________________________________________________________________  External Attachment:    Type:   Image     Comment:   External Document

## 2010-03-01 NOTE — Progress Notes (Signed)
Summary: pt has vomiting and diarrhea  Phone Note Call from Patient Call back at [phone]   Caller: Patient Call For: Judith Part MD Summary of Call: Pt has had vomiting and diarrhea since this morning.  No fever, no bad abd pain.  Advised pt to take frequent small sips of clear fluids, no solid foods, no dairy products.  She will call back tomorrow morning if not better. Initial call taken by: Lowella Petties CMA, AAMA,  December 27, 2009 3:33 PM  Follow-up for Phone Call        agree with above  if high fever or abdominal pain after hours - go to ER (also signs of dehydration -- dry mouth/ rapid heartbeat)  Follow-up by: Judith Part MD,  December 27, 2009 4:14 PM  Additional Follow-up for Phone Call Additional follow up Details #1::        Patient notified as instructed by telephone. Lewanda Rife LPN  December 27, 2009 4:53 PM

## 2010-03-01 NOTE — Progress Notes (Signed)
Summary: Meloxicam 15mg   Phone Note Refill Request Call back at [phone] Message from:  [facility] [address] on January 05, 2010 9:30 AM  Refills Requested: Medication #1:  MOBIC 15 MG TABS 1 by mouth once daily with food as needed pain - stop if any GI upset   Last Refilled: 09/16/2009 [facility] [address] electronically request refill on Meloxicam 15mg . Last refill date 09/16/09.Please advise.    Method Requested: Electronic Initial call taken by: Lewanda Rife LPN,  January 05, 2010 9:31 AM  Follow-up for Phone Call        Medication phoned to [facility] [address] pharmacy as instructed. Lewanda Rife LPN  January 05, 2010 10:02 AM     Prescriptions: MOBIC 15 MG TABS (MELOXICAM) 1 by mouth once daily with food as needed pain - stop if any GI upset  #30 x 11   Entered and Authorized by:   Judith Part MD   Signed by:   Lewanda Rife LPN on 78/24/2353   Method used:   Telephoned to ...       [facility] [address] (retail)       [address]       [place], [place]  [address]       Ph: [phone]       Fax: [phone]   RxID:   [id number]

## 2010-03-01 NOTE — Miscellaneous (Signed)
Summary: protonix update  Medications Added PROTONIX 40 MG TBEC (PANTOPRAZOLE SODIUM) Take 1 tablet by mouth once a day       Clinical Lists Changes  Medications: Added new medication of PROTONIX 40 MG TBEC (PANTOPRAZOLE SODIUM) Take 1 tablet by mouth once a day Removed medication of ZEGERID 40-1100 MG CAPS (OMEPRAZOLE-SODIUM BICARBONATE) Take one tab daily     Current Allergies: No known allergies

## 2010-03-03 NOTE — Assessment & Plan Note (Signed)
Summary: COUGH/CLE   Vital Signs:  Patient profile:   60 year old female Weight:      176.75 pounds Temp:     99.0 degrees F oral Pulse rate:   88 / minute Pulse rhythm:   regular BP sitting:   142 / 86  (left arm) Cuff size:   regular  Vitals Entered By: Selena Batten Dance CMA Duncan Dull) (January 25, 2010 2:01 PM) CC: Cough is no better   History of Present Illness: CC: f/u cough  seen 7 days ago with dx viral bronchitis and some RAD component, treated with albuterol and cheratussin.  Cheratussin isn't really helping cough.  Finished bottle as well as delsym.  Albuterol seems to help some.  + HA with cough.  Feels tired from coughing so much.  + hoarse.  Cough seems dry.  total  ~13 d illness.  Bad coughing fit christmas.  post tussive emesis.  + fever blister.  No abd pain, nausea, diarrhea, rashes, myalgias, arthralgias, CP/tightness, SOB.  No fevers/chills.  Has 85mo granddaughter and 8yo grandson at home babysitting today.  + sick contacts.  No smokers at home.  No asthma/allergies.  Current Medications (verified): 1)  Prozac 20 Mg Caps (Fluoxetine Hcl) .Marland Kitchen.. 1 By Mouth Once Daily 2)  Pravachol 80 Mg  Tabs (Pravastatin Sodium) .Marland Kitchen.. 1 By Mouth Once Daily 3)  Calcium 500/d 500-200 Mg-Unit Tabs (Calcium Carbonate-Vitamin D) .... Once Daily 4)  Benadryl Allergy/sinus Headach 12.5-5-325 Mg Tabs (Diphenhydramine-Pe-Apap) .... At Bedtime 5)  Protonix 40 Mg Tbec (Pantoprazole Sodium) .... Take 1 Tablet By Mouth Once A Day 6)  Mobic 15 Mg Tabs (Meloxicam) .Marland Kitchen.. 1 By Mouth Once Daily With Food As Needed Pain - Stop If Any Gi Upset 7)  Proair Hfa 108 (90 Base) Mcg/act Aers (Albuterol Sulfate) .... 2 Puffs Up To Every 4 Hours As Needed Wheeze 8)  Delsym 30 Mg/33ml Lqcr (Dextromethorphan Polistirex) .... Otc As Directed.  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  + dry cough Head:  normocephalic, atraumatic, and no abnormalities observed.  no sinus tenderness Eyes:  vision grossly intact, pupils equal, pupils round, and pupils reactive to light.  no conjunctival pallor, injection or icterus  Ears:  R ear normal and L ear normal.   Nose:  nares are boggy and congested  Mouth:  erythematous pharynx.  no exudates Neck:  supple with full rom and no masses or thyromegally, no JVD or carotid bruit  Lungs:  Normal respiratory effort, chest expands symmetrically. Lungs are clear to auscultation, no crackles or wheezes.  harsh cough Heart:  Normal rate and regular rhythm. S1 and S2 normal without gallop, murmur, click, rub or other extra sounds. Pulses:  2+ rad pulses Extremities:  No clubbing, cyanosis, edema, or deformity noted with normal full range of motion of all joints.   Skin:  Intact without suspicious lesions or rashes   Impression & Recommendations:  Problem # 1:  BRONCHITIS- ACUTE (ICD-466.0) continued despite alb and cheratussin.  titrate up to hydromet.  no findings on exam or story to suggest PNA.  treat  with Azithro to cover bronchitis as going on for >12days now.  advised to update if red flags (fevers, worsening cough or productive cough, trouble breathing, or more SOB.)  continue albuterol.  The following medications were removed from the medication list:    Cheratussin Ac 100-10 Mg/41ml Syrp (Guaifenesin-codeine) .Marland Kitchen... 1-2 teaspoons by mouth up to every 4-6 hours as needed cough Her updated medication list for this problem includes:    Benadryl Allergy/sinus Headach 12.5-5-325 Mg Tabs (Diphenhydramine-pe-apap) .Marland Kitchen... At bedtime     Proair Hfa 108 (90 Base) Mcg/act Aers (Albuterol sulfate) .Marland Kitchen... 2 puffs up to every 4 hours as needed wheeze    Delsym 30 Mg/42ml Lqcr (Dextromethorphan polistirex) ..... Otc as directed.    Zithromax Z-pak 250 Mg Tabs (Azithromycin) ..... Use as directed    Hydromet 5-1.5 Mg/40ml Syrp (Hydrocodone-homatropine) ..... One teaspoon at bedtime as needed cough  Complete Medication List: 1)  Prozac 20 Mg Caps (Fluoxetine hcl) .Marland Kitchen.. 1 by mouth once daily 2)  Pravachol 80 Mg Tabs (Pravastatin sodium) .Marland Kitchen.. 1 by mouth once daily 3)  Calcium 500/d 500-200 Mg-unit Tabs (Calcium carbonate-vitamin d) .... Once daily 4)  Benadryl Allergy/sinus Headach 12.5-5-325 Mg Tabs (Diphenhydramine-pe-apap) .... At bedtime 5)  Protonix 40 Mg Tbec (Pantoprazole sodium) .... Take 1 tablet by mouth once a day 6)  Mobic 15 Mg Tabs (Meloxicam) .Marland Kitchen.. 1 by mouth once daily with food as needed pain - stop if any gi upset 7)  Proair Hfa 108 (90 Base) Mcg/act Aers (Albuterol sulfate) .... 2 puffs up to every 4 hours as needed wheeze 8)  Delsym 30 Mg/62ml Lqcr (Dextromethorphan polistirex) .... Otc as directed. 9)  Zithromax Z-pak 250 Mg Tabs (Azithromycin) .... Use as directed 10)  Hydromet 5-1.5 Mg/21ml Syrp (Hydrocodone-homatropine) .... One teaspoon at bedtime as needed cough  Patient Instructions: 1)  continued bronchitis.   2)  use hydromet for cough at night.  3)  a zpack for cough as well as going on for so long. 4)  update [place] if not improved. 5)  if you develop much worse productive cough or fever - please let me know  6)  continue inhaler. Prescriptions: HYDROMET 5-1.5 MG/5ML SYRP (HYDROCODONE-HOMATROPINE) one teaspoon at bedtime as needed cough  #100cc x 0   Entered and Authorized by:   Eustaquio Boyden  MD   Signed by:   Eustaquio Boyden  MD on 01/25/2010   Method used:   Print then Give to Patient   RxID:   [id number] JYNWGNFAO Z-PAK 250 MG TABS (AZITHROMYCIN) use as directed  #1 x 0   Entered and Authorized  by:   Eustaquio Boyden  MD   Signed by:   Eustaquio Boyden  MD on 01/25/2010   Method used:   Electronically to        [facility] [address] (retail)       [address], [address]       [place], [place]  [address]       Ph: [phone]       Fax: [phone]   RxID:   [id number]    Orders Added: 1)  Est. Patient Level III [59563]    Current Allergies (reviewed today): No known allergies   Appended Document: COUGH/CLE pulse ox 95% on RA.

## 2010-03-03 NOTE — Assessment & Plan Note (Signed)
Summary: COLD/CLE   Vital Signs:  Patient profile:   60 year old female Height:      65.5 inches Weight:      177 pounds BMI:     29.11 Temp:     98.3 degrees F oral Pulse rate:   80 / minute Pulse rhythm:   regular BP sitting:   130 / 76  (left arm) Cuff size:   regular  Vitals Entered By: Lewanda Rife LPN (January 19, 2010 12:03 PM) CC: cold, non productive cough, Back Pain   History of Present Illness: has a cold and really bad cough again  started friday afternoon -- and was not too bad got better and then worse  now much worse with bad cough- dry and not productive  nasal congestion with clear d/c - runs at times  no fever -- no chills or aches   is drinking lots of water   ears feel ok but feels a bit off balance  throat is not hurting   no wheezing or sob   is taking delsym   son is sick now - with bronchitis      Allergies (verified): No Known Drug Allergies  Past History:  Past Medical History: Last updated: 05/02/2008 Depression GERD Hyperlipidemia Pancreatitis, hx of osteopenia esophageal stricture   GI- Dr Arlyce Dice  Past Surgical History: Last updated: 05/02/2008 Tubal ligation Tonsillectomy EGD 09/99, 01/02, 06/07 colonoscopy 11/01, 02/07 DEXA 12/02,  02/05 Fractured shoulder 2006 CT of abd, pelvis 05/04- negative ultrasound 03/04- fatty liver Bladder Repair 3/10 EGD- esoph stricture/dilated   Family History: Last updated: 08/26/2006 Father: Deceased, cirrhosis of the liver Mother: Colon cancer, HTN, CVA Siblings: One sister, three brothers son- drug addiction  Social History: Last updated: 04/20/2008 Marital Status: Married Children:1  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 Eyes:  Denies blurring and eye irritation. CV:   Denies chest pain or discomfort and palpitations. Resp:  Complains of cough; denies pleuritic, shortness of breath, sputum productive, and wheezing. GI:  Denies abdominal pain, change in bowel habits, and indigestion. GU:  Denies dysuria and urinary frequency. Derm:  Denies rash.  Physical Exam  General:  overweight but generally well appearing  Head:  normocephalic, atraumatic, and no abnormalities observed.  no sinus tenderness Eyes:  vision grossly intact, pupils equal, pupils round, and pupils reactive to light.  no conjunctival pallor, injection or icterus  Ears:  R ear normal and L ear normal.   Nose:  nares are boggy and congested  Mouth:  pharynx pink and moist.   Neck:  supple with full rom and no masses or thyromegally, no JVD or carotid bruit  Chest Wall:  No deformities, masses, or tenderness noted. Lungs:  harsh bs at bases with scant wheeze on forced exp no rales or rhonchi  overall good air exch harsh dry cough Heart:  Normal rate and regular rhythm. S1 and S2 normal without gallop, murmur, click, rub or other extra sounds. Msk:  No deformity or scoliosis noted of thoracic or lumbar spine.   Skin:  Intact without suspicious lesions or rashes Cervical Nodes:  No lymphadenopathy noted Psych:  normal affect, talkative and pleasant    Impression & Recommendations:  Problem # 1:  BRONCHITIS- ACUTE (ICD-466.0) Assessment New following viral uri / also believe this to be viral  some very mild reactive airways  will px cheratussin and proair as needed  pt advised to update me if symptoms worsen or do not improve  The following medications were removed from the medication list:    Zithromax Z-pak 250 Mg Tabs (Azithromycin) .Marland Kitchen... Take by mouth as directed Her updated medication list for this problem includes:    Benadryl Allergy/sinus Headach 12.5-5-325 Mg Tabs (Diphenhydramine-pe-apap) .Marland Kitchen... At bedtime    Cheratussin Ac 100-10 Mg/38ml Syrp (Guaifenesin-codeine) .Marland Kitchen... 1-2  teaspoons by mouth up to every 4-6 hours as needed cough    Proair Hfa 108 (90 Base) Mcg/act Aers (Albuterol sulfate) .Marland Kitchen... 2 puffs up to every 4 hours as needed wheeze    Delsym 30 Mg/62ml Lqcr (Dextromethorphan polistirex) ..... Otc as directed.  Complete Medication List: 1)  Prozac 20 Mg Caps (Fluoxetine hcl) .Marland Kitchen.. 1 by mouth once daily 2)  Pravachol 80 Mg Tabs (Pravastatin sodium) .Marland Kitchen.. 1 by mouth once daily 3)  Calcium 500/d 500-200 Mg-unit Tabs (Calcium carbonate-vitamin d) .... Once daily 4)  Benadryl Allergy/sinus Headach 12.5-5-325 Mg Tabs (Diphenhydramine-pe-apap) .... At bedtime 5)  Protonix 40 Mg Tbec (Pantoprazole sodium) .... Take 1 tablet by mouth once a day 6)  Mobic 15 Mg Tabs (Meloxicam) .Marland Kitchen.. 1 by mouth once daily with food as needed pain - stop if any gi upset 7)  Cheratussin Ac 100-10 Mg/30ml Syrp (Guaifenesin-codeine) .Marland Kitchen.. 1-2 teaspoons by mouth up to every 4-6 hours as needed cough 8)  Proair Hfa 108 (90 Base) Mcg/act Aers (Albuterol sulfate) .... 2 puffs up to every 4 hours as needed wheeze 9)  Delsym 30 Mg/34ml Lqcr (Dextromethorphan polistirex) .... Otc as directed.  Patient Instructions: 1)  you can try the chertussin for cough and nasal saline spray for congestion 2)  tylenol over the counter as directed may help with aches, headache and fever 3)  call if symptoms worsen or if not improved in 4-5 days  4)  if you develop much worse productive cough or fever - please let me know  5)  use inhaler for wheezing - also update me if wheezing worsens  Prescriptions: PROAIR HFA 108 (90 BASE) MCG/ACT AERS (ALBUTEROL SULFATE) 2 puffs up to every 4 hours as needed wheeze  #1 mdi x 3   Entered and Authorized by:   Judith Part MD   Signed by:   Judith Part MD on 01/19/2010   Method used:   Print then Give to Patient   RxID:   [id number] CHERATUSSIN AC 100-10 MG/5ML SYRP (GUAIFENESIN-CODEINE) 1-2 teaspoons by mouth up to every 4-6 hours as needed cough  #120cc x 0    Entered and Authorized by:   Judith Part MD   Signed by:   Judith Part MD on 01/19/2010   Method used:   Print then Give to Patient   RxID:   [id number]    Orders Added: 1)  Est. Patient Level III [95284]    Current Allergies (reviewed today): No known allergies

## 2010-03-21 ENCOUNTER — Encounter: Payer: Self-pay | Admitting: Gastroenterology

## 2010-03-29 NOTE — Letter (Signed)
Summary: Colonoscopy Letter   Gastroenterology  [address] [place], [place] [address]   Phone: [phone]  Fax: [phone]      March 21, 2010 MRN: [id number]   [facility] [address] [place], [place]  [address]   Dear Connie Rowe,   According to your medical record, it is time for you to schedule a Colonoscopy. The American Cancer Society recommends this procedure as a method to detect early colon cancer. Patients with a family history of colon cancer, or a personal history of colon polyps or inflammatory bowel disease are at increased risk.  This letter has been generated based on the recommendations made at the time of your procedure. If you feel that in your particular situation this may no longer apply, please contact our office.  Please call our office at [phone] to schedule this appointment or to update your records at your earliest convenience.  Thank you for cooperating with [place] to provide you with the very best care possible.   Sincerely,  Barbette Hair. Arlyce Dice, M.D.  [facility] Gastroenterology Division [phone]

## 2010-05-24 ENCOUNTER — Other Ambulatory Visit: Payer: Self-pay | Admitting: Family Medicine

## 2010-05-25 NOTE — Telephone Encounter (Signed)
Pt needs to call for appt. 

## 2010-06-14 NOTE — Op Note (Signed)
[name], Connie Rowe               ACCOUNT NO.:  [id number]   MEDICAL RECORD NO.:  [id number]          PATIENT TYPE:  AMB   LOCATION:  [facility]                         FACILITY:  [facility]   PHYSICIAN:  Martina Sinner, MD DATE OF BIRTH:  [birth year]   DATE OF PROCEDURE:  12/25/2006  DATE OF DISCHARGE:                               OPERATIVE REPORT   PREOPERATIVE DIAGNOSIS:  Urinary stress incontinence.   POSTOPERATIVE DIAGNOSES:  Urinary stress incontinence.   PROCEDURE:  Sling cystourethropexy [facility]) plus cystoscopy.   INDICATIONS FOR PROCEDURE:  The patient has mixed urge and stress  incontinence.  She has failed medical therapy.   DESCRIPTION OF PROCEDURE:  The patient is prepped and draped in the  usual sterile fashion.  Two 1 cm incisions were made one fingerbreadth  above the symphysis pubis 1.5 cm lateral to the midline.  I then made a  2 cm incision overlying the mid urethra after instilling 6 mL of  lidocaine epinephrine mixture.  I made a thick incision allowing for a  thick pubocervical flap.  I dissected sharply to the urethrovesical  angle bilaterally.   I passed the [facility] needle on top of and along the back of the symphysis  pubis parallel to the midline along the pulp of my index finger  bilaterally.   I cystoscoped the patient and there was no injury to the bladder or  urethra.  With wiggling of the trocar, there was no indentation of the  bladder.  There was efflux of indigo Carmine from both ureteral  orifices.   With the bladder empty, I attached a SPARC sling and brought it up to  the retropubic space.  I tented it over the mid urethra.  I cut below  the blue dots, irrigated the sheath and removed the sheath.  I was very  happy with the position and tension of the sling.  There was no spring-  back effect.  There was hypermobility of the sling.   Irrigation was utilized.  I closed the anterior vaginal wall with  running 2-0 Vicryl followed by two interrupted  sutures.  There was a  little bit of tearing of the incision due to her narrow vagina and small  incision.  This was addressed at the closure.  I was very happy with the  closure.  I cut the sling below the skin and closed the skin with 4-0  Vicryl subcuticular and Dermabond.   It is important to note that the patient had a very narrow introitus and  in my opinion it would be very difficult to do vaginal vault surgery or  an anterior and posterior repair transvaginally in the future.   Hopefully this operation will reach her treatment goal.           ______________________________  Martina Sinner, MD  Electronically Signed     SAM/MEDQ  D:  12/25/2006  T:  12/25/2006  Job:  [id number]

## 2010-08-29 ENCOUNTER — Other Ambulatory Visit: Payer: Self-pay | Admitting: Family Medicine

## 2010-08-31 NOTE — Telephone Encounter (Signed)
[facility] [address] electronically request refill on Fluoxetine 20mg  . Please advise.

## 2010-08-31 NOTE — Telephone Encounter (Signed)
[facility] [address] sent electronic refill request for Protonix 40mg  #30 x0 refills and Pravastatin 80mg  #30 x0 refills. Pt has not had lab since 2010. Pt needs to call for appt.

## 2010-09-01 ENCOUNTER — Telehealth: Payer: Self-pay | Admitting: *Deleted

## 2010-09-01 DIAGNOSIS — Z1231 Encounter for screening mammogram for malignant neoplasm of breast: Secondary | ICD-10-CM | POA: Insufficient documentation

## 2010-09-01 MED ORDER — PRAVASTATIN SODIUM 80 MG PO TABS: 80.0000 mg | ORAL_TABLET | Freq: Every day | ORAL | Status: DC

## 2010-09-01 MED ORDER — PANTOPRAZOLE SODIUM 40 MG PO TBEC: 40.0000 mg | DELAYED_RELEASE_TABLET | Freq: Every day | ORAL | Status: DC

## 2010-09-01 NOTE — Telephone Encounter (Signed)
Will do there referral

## 2010-09-01 NOTE — Telephone Encounter (Signed)
She really needs to make appt for physical - please do that  (any 30 min visit time is fine and she needs labs prior)-thanks I can refil her until she comes  Will do that electronically

## 2010-09-01 NOTE — Telephone Encounter (Signed)
Patient notified as instructed by telephone. 

## 2010-09-01 NOTE — Telephone Encounter (Signed)
Pt has been having [facility]ograms [facility] but she wants to switch to [place] because she doesn't want to drive to [employer].  She is asking that we send them an order.  Advised her to check with them mid next week, order should be there by then and she can schedule her appt.

## 2010-09-01 NOTE — Telephone Encounter (Signed)
Patient notified as instructed by telephone. Connie Rowe is making pt CPX appt with labs prior now.

## 2010-09-10 ENCOUNTER — Telehealth: Payer: Self-pay | Admitting: Family Medicine

## 2010-09-10 DIAGNOSIS — Z0000 Encounter for general adult medical examination without abnormal findings: Secondary | ICD-10-CM

## 2010-09-10 DIAGNOSIS — M899 Disorder of bone, unspecified: Secondary | ICD-10-CM

## 2010-09-10 DIAGNOSIS — E785 Hyperlipidemia, unspecified: Secondary | ICD-10-CM

## 2010-09-10 NOTE — Telephone Encounter (Signed)
Message copied by Judy Pimple on Sat Sep 10, 2010 10:03 PM ------      Message from: Baldomero Lamy      Created: Wed Sep 07, 2010 11:03 AM      Regarding: cpx labs wed 8/15       Please order  future cpx labs for pt's upcomming lab appt.      Thanks      Rodney Booze

## 2010-09-14 ENCOUNTER — Other Ambulatory Visit (INDEPENDENT_AMBULATORY_CARE_PROVIDER_SITE_OTHER): Payer: BC Managed Care – PPO | Admitting: Family Medicine

## 2010-09-14 DIAGNOSIS — E785 Hyperlipidemia, unspecified: Secondary | ICD-10-CM

## 2010-09-14 DIAGNOSIS — M949 Disorder of cartilage, unspecified: Secondary | ICD-10-CM

## 2010-09-14 DIAGNOSIS — Z0000 Encounter for general adult medical examination without abnormal findings: Secondary | ICD-10-CM

## 2010-09-14 DIAGNOSIS — M899 Disorder of bone, unspecified: Secondary | ICD-10-CM

## 2010-09-14 LAB — COMPREHENSIVE METABOLIC PANEL
ALT: 21 U/L (ref 0–35)
AST: 20 U/L (ref 0–37)
Albumin: 4.3 g/dL (ref 3.5–5.2)
CO2: 28 mEq/L (ref 19–32)
Calcium: 9.1 mg/dL (ref 8.4–10.5)
Chloride: 107 mEq/L (ref 96–112)
Creatinine, Ser: 1.1 mg/dL (ref 0.4–1.2)
Glucose, Bld: 103 mg/dL — ABNORMAL HIGH (ref 70–99)
Potassium: 4.1 mEq/L (ref 3.5–5.1)
Sodium: 142 mEq/L (ref 135–145)
Total Protein: 7.7 g/dL (ref 6.0–8.3)

## 2010-09-14 LAB — CBC WITH DIFFERENTIAL/PLATELET
Eosinophils Relative: 2.9 % (ref 0.0–5.0)
Lymphs Abs: 1.9 10*3/uL (ref 0.7–4.0)
MCV: 90.2 fl (ref 78.0–100.0)
Monocytes Absolute: 0.4 10*3/uL (ref 0.1–1.0)

## 2010-09-14 LAB — LIPID PANEL: Cholesterol: 208 mg/dL — ABNORMAL HIGH (ref 0–200)

## 2010-09-15 LAB — VITAMIN D 25 HYDROXY (VIT D DEFICIENCY, FRACTURES): Vit D, 25-Hydroxy: 42 ng/mL (ref 30–89)

## 2010-09-19 ENCOUNTER — Ambulatory Visit (INDEPENDENT_AMBULATORY_CARE_PROVIDER_SITE_OTHER): Payer: BC Managed Care – PPO | Admitting: Family Medicine

## 2010-09-19 ENCOUNTER — Encounter: Payer: Self-pay | Admitting: Family Medicine

## 2010-09-19 DIAGNOSIS — Z0000 Encounter for general adult medical examination without abnormal findings: Secondary | ICD-10-CM

## 2010-09-22 NOTE — Progress Notes (Signed)
  Subjective:    Patient ID: Connie Rowe, female    DOB: [birth year], 60 y.o.   MRN: [id number]  HPI This pt was cancellation/ no show    Review of Systems     Objective:   Physical Exam        Assessment & Plan:

## 2010-10-25 ENCOUNTER — Ambulatory Visit: Payer: Self-pay | Admitting: Family Medicine

## 2010-11-01 ENCOUNTER — Encounter: Payer: Self-pay | Admitting: Family Medicine

## 2010-11-04 ENCOUNTER — Encounter: Payer: Self-pay | Admitting: *Deleted

## 2010-11-08 LAB — CBC
HCT: 36.2
Hemoglobin: 12.9
MCHC: 34.5
MCV: 87.3
MCV: 87.8
RBC: 4.25
RDW: 14.1
WBC: 4.3
WBC: 5

## 2010-11-08 LAB — TYPE AND SCREEN
ABO/RH(D): B POS
Antibody Screen: NEGATIVE

## 2010-11-08 LAB — ABO/RH: ABO/RH(D): B POS

## 2010-11-08 LAB — PROTIME-INR: INR: 0.9

## 2010-11-17 ENCOUNTER — Ambulatory Visit (INDEPENDENT_AMBULATORY_CARE_PROVIDER_SITE_OTHER): Payer: BC Managed Care – PPO

## 2010-11-17 DIAGNOSIS — Z23 Encounter for immunization: Secondary | ICD-10-CM

## 2010-12-06 ENCOUNTER — Ambulatory Visit (INDEPENDENT_AMBULATORY_CARE_PROVIDER_SITE_OTHER): Payer: BC Managed Care – PPO | Admitting: Family Medicine

## 2010-12-06 ENCOUNTER — Encounter: Payer: Self-pay | Admitting: Family Medicine

## 2010-12-06 VITALS — BP 138/88 | HR 88 | Temp 98.9°F | Ht 64.25 in | Wt 180.0 lb

## 2010-12-06 DIAGNOSIS — M899 Disorder of bone, unspecified: Secondary | ICD-10-CM

## 2010-12-06 DIAGNOSIS — E785 Hyperlipidemia, unspecified: Secondary | ICD-10-CM

## 2010-12-06 DIAGNOSIS — L680 Hirsutism: Secondary | ICD-10-CM

## 2010-12-06 DIAGNOSIS — Z0000 Encounter for general adult medical examination without abnormal findings: Secondary | ICD-10-CM

## 2010-12-06 DIAGNOSIS — M949 Disorder of cartilage, unspecified: Secondary | ICD-10-CM

## 2010-12-06 MED ORDER — PRAVASTATIN SODIUM 80 MG PO TABS: 80.0000 mg | ORAL_TABLET | Freq: Every day | ORAL | Status: DC

## 2010-12-06 MED ORDER — FLUOXETINE HCL 20 MG PO CAPS: 20.0000 mg | ORAL_CAPSULE | Freq: Every day | ORAL | Status: DC

## 2010-12-06 MED ORDER — PANTOPRAZOLE SODIUM 40 MG PO TBEC: 40.0000 mg | DELAYED_RELEASE_TABLET | Freq: Every day | ORAL | Status: DC

## 2010-12-06 MED ORDER — EFLORNITHINE HCL 13.9 % EX CREA: TOPICAL_CREAM | CUTANEOUS | Status: DC

## 2010-12-06 NOTE — Patient Instructions (Addendum)
If you are interested in a shingles/zoster vaccine - call your insurance to check on coverage, and you should not get it within 1 month of other vaccines, then call us for a px for it to take to a pharmacy that gives the vaccine  Try vaniqa for hair growth Avoid red meat/ fried foods/ egg yolks/ fatty breakfast meats/ butter, cheese and high fat dairy/ and shellfish   Keep walking  We will schedule bone density test at check out

## 2010-12-06 NOTE — Assessment & Plan Note (Signed)
With menopause - disc fact that this is not app reason for HRT Disc use of vaniqa and she wants to try it  Will update

## 2010-12-06 NOTE — Assessment & Plan Note (Addendum)
Reviewed health habits including diet and exercise and skin cancer prevention Also reviewed health mt list, fam hx and immunizations   Rev wellness lab in detail Pt will call ins about zoster vaccine

## 2010-12-06 NOTE — Progress Notes (Signed)
Subjective:    Patient ID: Connie Rowe, female    DOB: [birth year], 60 y.o.   MRN: [id number]  HPI Here for annual health mt exam and to disc chronic health problems  Is feeling good in general  One problem - is hair growth - on arms/ face -- since menopause  Was wondering about hrt  No hot flashes or night sweats   Would be open to trying vaniqa   138/88 bp today  No ha or other symptoms   Wt is up 4 lb with bmi of 30 She does not weigh- but clothes  Has a hard time loosing weight  Her appetite is big   Zoster status- never had the disease or vaccine    Pap 3/10 Has not had any abnormal paps No new partners  No gyn symtoms except dryness  Will wait one more year on pap   Flu shot up to date Td 08  Mam 9/12 nl  Self exam no lumps or changes   Labs Glucose 103   Lipids on pravachol and diet  Lab Results  Component Value Date   CHOL 208* 09/14/2010   CHOL 192 07/16/2008   CHOL 208* 04/16/2008   Lab Results  Component Value Date   HDL 64.70 09/14/2010   HDL 08.65 07/16/2008   HDL 78.46 04/16/2008   Lab Results  Component Value Date   LDLCALC 112* 07/16/2008   LDLCALC 92 10/04/2006   Lab Results  Component Value Date   TRIG 158.0* 09/14/2010   TRIG 127.0 07/16/2008   TRIG 95.0 04/16/2008   Lab Results  Component Value Date   CHOLHDL 3 09/14/2010   CHOLHDL 4 07/16/2008   CHOLHDL 4 04/16/2008   Lab Results  Component Value Date   LDLDIRECT 122.4 09/14/2010   LDLDIRECT 132.2 04/16/2008   LDLDIRECT 128.9 07/05/2006   really tries to watch her diet - but sometimes cheats  She really does like pravachol   Osteopenia - vit D level is 42 Is taking her ca and vit D Has been 3 y since her dexa   Is retired and more time to herself  Her son moved out  Mother passed away  Brother had a stroke -- DM and did not take care of himself - she takes care of him too   Patient Active Problem List  Diagnoses  . HELICOBACTER PYLORI INFECTION  . HYPERLIPIDEMIA  .  DEPRESSION  . EXTERNAL HEMORRHOIDS  . ESOPHAGEAL STRICTURE  . GERD  . HIATAL HERNIA  . DIVERTICULOSIS, COLON  . OVERACTIVE BLADDER  . HEMATURIA, MICROSCOPIC  . OSTEOPENIA  . URINARY INCONTINENCE, STRESS  . GAMMA GLUTAMYL TRANSFERASE, SERUM, ELEVATED  . PANCREATITIS, HX OF  . Other screening mammogram  . Routine general medical examination at a health care facility  . Hirsutism   Past Medical History  Diagnosis Date  . Depression   . GERD (gastroesophageal reflux disease)   . Hyperlipidemia   . Pancreatitis     History of  . Osteopenia   . Esophageal stricture    Past Surgical History  Procedure Date  . Tubal ligation   . Tonsillectomy   . Bladder repair    History  Substance Use Topics  . Smoking status: Never Smoker   . Smokeless tobacco: Not on file  . Alcohol Use: No   Family History  Problem Relation Age of Onset  . Stroke Mother   . Hypertension Mother   . Cancer Mother  colon CA  . Drug abuse Son   . Stroke Brother    No Known Allergies Current Outpatient Prescriptions on File Prior to Visit  Medication Sig Dispense Refill  . Calcium Carbonate-Vitamin D (CALCIUM-VITAMIN D) 500-200 MG-UNIT per tablet Take 1 tablet by mouth daily.        . meloxicam (MOBIC) 15 MG tablet Take 15 mg by mouth daily. with food as needed. Stop if any GI upset.       Marland Kitchen albuterol (PROAIR HFA) 108 (90 BASE) MCG/ACT inhaler Inhale 2 puffs into the lungs every 4 (four) hours as needed.            Review of Systems Review of Systems  Constitutional: Negative for fever, appetite change, fatigue and unexpected weight change.  Eyes: Negative for pain and visual disturbance.  Respiratory: Negative for cough and shortness of breath.   Cardiovascular: Negative for cp or palpitations    Gastrointestinal: Negative for nausea, diarrhea and constipation.  Genitourinary: Negative for urgency and frequency.  Skin: Negative for pallor or rash   Neurological: Negative for weakness,  light-headedness, numbness and headaches.  Hematological: Negative for adenopathy. Does not bruise/bleed easily.  Psychiatric/Behavioral: Negative for dysphoric mood. The patient is not nervous/anxious.  does have lots of stress         Objective:   Physical Exam  Constitutional: She appears well-developed and well-nourished. No distress.       overwt and well appearing   HENT:  Head: Normocephalic and atraumatic.  Right Ear: External ear normal.  Left Ear: External ear normal.  Mouth/Throat: Oropharynx is clear and moist. No oropharyngeal exudate.  Eyes: Conjunctivae and EOM are normal. Pupils are equal, round, and reactive to light. No scleral icterus.  Neck: Normal range of motion. Neck supple. No tracheal deviation present. No thyromegaly present.  Cardiovascular: Normal rate, regular rhythm, normal heart sounds and intact distal pulses.  Exam reveals no gallop.   Pulmonary/Chest: Effort normal and breath sounds normal. No respiratory distress. She has no wheezes. She exhibits no tenderness.  Abdominal: Soft. Bowel sounds are normal. She exhibits no distension, no abdominal bruit and no mass. There is no tenderness.  Genitourinary: No breast swelling, tenderness, discharge or bleeding.  Musculoskeletal: Normal range of motion. She exhibits no edema and no tenderness.  Lymphadenopathy:    She has no cervical adenopathy.  Neurological: She is alert. She has normal reflexes. No cranial nerve deficit. She exhibits normal muscle tone. Coordination normal.  Skin: Skin is warm and dry. No rash noted. No erythema. No pallor.  Psychiatric: She has a normal mood and affect.          Assessment & Plan:

## 2010-12-06 NOTE — Assessment & Plan Note (Signed)
Due for dexa- will schedule that  Disc ca and D and imp of exercise No fx Vit D level is theraputic- urged to keep taking it

## 2010-12-06 NOTE — Assessment & Plan Note (Signed)
Fair control with pravachol (which does not aff liver tests) Urged more tight diet control Disc goals for lipids and reasons to control them Rev labs with pt Rev low sat fat diet in detail

## 2010-12-13 ENCOUNTER — Inpatient Hospital Stay (INDEPENDENT_AMBULATORY_CARE_PROVIDER_SITE_OTHER)
Admission: RE | Admit: 2010-12-13 | Discharge: 2010-12-13 | Disposition: A | Discharge status: Home / Self Care | Payer: BC Managed Care – PPO | Source: Ambulatory Visit | Attending: Family Medicine | Admitting: Family Medicine

## 2010-12-13 DIAGNOSIS — M949 Disorder of cartilage, unspecified: Secondary | ICD-10-CM

## 2010-12-13 DIAGNOSIS — M899 Disorder of bone, unspecified: Secondary | ICD-10-CM

## 2011-01-02 ENCOUNTER — Encounter: Payer: Self-pay | Admitting: Family Medicine

## 2011-03-06 ENCOUNTER — Other Ambulatory Visit: Payer: Self-pay | Admitting: Family Medicine

## 2011-03-08 NOTE — Telephone Encounter (Signed)
[facility] [address] request refill Meloxicam 15 mg. Pt last seen by Dr Milinda Antis 12/06/10.Please advise.

## 2011-03-09 NOTE — Telephone Encounter (Signed)
Will refill electronically  

## 2011-09-18 ENCOUNTER — Other Ambulatory Visit: Payer: Self-pay | Admitting: Family Medicine

## 2011-09-18 ENCOUNTER — Other Ambulatory Visit: Payer: Self-pay

## 2011-10-17 ENCOUNTER — Encounter: Payer: Self-pay | Admitting: Gastroenterology

## 2011-12-14 ENCOUNTER — Ambulatory Visit (INDEPENDENT_AMBULATORY_CARE_PROVIDER_SITE_OTHER): Payer: BC Managed Care – PPO

## 2011-12-14 DIAGNOSIS — Z23 Encounter for immunization: Secondary | ICD-10-CM

## 2011-12-23 ENCOUNTER — Other Ambulatory Visit: Payer: Self-pay | Admitting: Family Medicine

## 2011-12-25 NOTE — Telephone Encounter (Signed)
appt scheduled for 01/15/12 an meds refilled

## 2011-12-25 NOTE — Telephone Encounter (Signed)
Ok to refill? No future appt., no recent appt or labs

## 2011-12-25 NOTE — Telephone Encounter (Signed)
Please schedule a winter f/u and refil meds until then, thanks

## 2012-01-15 ENCOUNTER — Ambulatory Visit: Payer: BC Managed Care – PPO | Admitting: Family Medicine

## 2012-01-16 ENCOUNTER — Encounter: Payer: Self-pay | Admitting: Family Medicine

## 2012-01-16 ENCOUNTER — Ambulatory Visit (INDEPENDENT_AMBULATORY_CARE_PROVIDER_SITE_OTHER): Payer: BC Managed Care – PPO | Admitting: Family Medicine

## 2012-01-16 VITALS — BP 138/80 | HR 80 | Temp 98.3°F | Ht 64.25 in | Wt 179.5 lb

## 2012-01-16 DIAGNOSIS — E785 Hyperlipidemia, unspecified: Secondary | ICD-10-CM

## 2012-01-16 DIAGNOSIS — F329 Major depressive disorder, single episode, unspecified: Secondary | ICD-10-CM

## 2012-01-16 LAB — COMPREHENSIVE METABOLIC PANEL
AST: 19 U/L (ref 0–37)
Albumin: 4.2 g/dL (ref 3.5–5.2)
BUN: 18 mg/dL (ref 6–23)
CO2: 26 mEq/L (ref 19–32)
Calcium: 9.3 mg/dL (ref 8.4–10.5)
Chloride: 103 mEq/L (ref 96–112)
Creatinine, Ser: 1.1 mg/dL (ref 0.4–1.2)
GFR: 54.97 mL/min — ABNORMAL LOW (ref >60.00)
Potassium: 4.2 mEq/L (ref 3.5–5.1)

## 2012-01-16 LAB — LIPID PANEL
Cholesterol: 181 mg/dL (ref 0–200)
HDL: 52.9 mg/dL (ref >39.00)
LDL Cholesterol: 103 mg/dL — ABNORMAL HIGH (ref 0–99)
Total CHOL/HDL Ratio: 3
Triglycerides: 126 mg/dL (ref 0.0–149.0)
VLDL: 25.2 mg/dL (ref 0.0–40.0)

## 2012-01-16 MED ORDER — PRAVASTATIN SODIUM 80 MG PO TABS: 80.0000 mg | ORAL_TABLET | Freq: Every day | ORAL | Status: DC

## 2012-01-16 MED ORDER — PANTOPRAZOLE SODIUM 40 MG PO TBEC: 40.0000 mg | DELAYED_RELEASE_TABLET | Freq: Every day | ORAL | Status: DC

## 2012-01-16 MED ORDER — FLUOXETINE HCL 20 MG PO CAPS: 20.0000 mg | ORAL_CAPSULE | Freq: Every day | ORAL | Status: DC

## 2012-01-16 MED ORDER — MELOXICAM 15 MG PO TABS: 15.0000 mg | ORAL_TABLET | Freq: Every day | ORAL | Status: DC | PRN

## 2012-01-16 NOTE — Assessment & Plan Note (Signed)
Doing well with prozac Less stress Enc more exercise Good coping skills and attitude and support

## 2012-01-16 NOTE — Progress Notes (Signed)
Subjective:    Patient ID: Connie Rowe, female    DOB: 06/24/1950, 61 y.o.   MRN: [id number]  HPI Here for f/u of chronic medical problems  Is doing pretty well No problems   Had some hip pain- worked itself out  In buttock/ outer hip Some exercise - walking daily   Hyperlipidemia Lab Results  Component Value Date   CHOL 208* 09/14/2010   HDL 64.70 09/14/2010   LDLCALC 112* 07/16/2008   LDLDIRECT 122.4 09/14/2010   TRIG 158.0* 09/14/2010   CHOLHDL 3 09/14/2010  is overdue to check cholesterol -- was lost to f/u On pravastatin Diet- just eats what she likes Tries to watch chol/ no fried foods  Eats out a lot - trying to [employer] is stable with bmi of 30  Depression- mood is fine  On prozac- no changes  Not quite as much stress-does not work outside home- 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 HELICOBACTER PYLORI INFECTION  . HYPERLIPIDEMIA  . DEPRESSION  . EXTERNAL HEMORRHOIDS  . ESOPHAGEAL STRICTURE  . GERD  . HIATAL HERNIA  . DIVERTICULOSIS, COLON  . OVERACTIVE BLADDER  . HEMATURIA, MICROSCOPIC  . OSTEOPENIA  . URINARY INCONTINENCE, STRESS  . GAMMA GLUTAMYL TRANSFERASE, SERUM, ELEVATED  . PANCREATITIS, HX OF  . Other screening mammogram  . Routine general medical examination at a health care facility  . Hirsutism   Past Medical History  Diagnosis Date  . Depression   . GERD (gastroesophageal reflux disease)   . Hyperlipidemia   . Pancreatitis     History of  . Osteopenia   . Esophageal stricture    Past Surgical History  Procedure Date  . Tubal ligation   . Tonsillectomy   . Bladder repair    History  Substance Use Topics  . Smoking status: Never Smoker   . Smokeless tobacco: Not on file  . Alcohol Use: No   Family History  Problem Relation Age of Onset  . Stroke Mother   . Hypertension Mother   . Cancer Mother     colon CA  . Drug abuse Son   . Stroke Brother     No Known Allergies Current Outpatient Prescriptions on File Prior to Visit  Medication Sig Dispense Refill  . Calcium Carbonate-Vitamin D (CALCIUM-VITAMIN D) 500-200 MG-UNIT per tablet Take 1 tablet by mouth daily.        . diphenhydrAMINE (BENADRYL) 25 MG tablet Take 25 mg by mouth every 6 (six) hours as needed.        Marland Kitchen FLUoxetine (PROZAC) 20 MG capsule TAKE ONE CAPSULE BY MOUTH EVERY DAY  30 capsule  0  . meloxicam (MOBIC) 15 MG tablet TAKE ONE TABLET BY MOUTH EVERY DAY WITH FOOD AS NEEDED FOR PAIN (HOLD FOR GI UPSET)  30 tablet  5  . pantoprazole (PROTONIX) 40 MG tablet TAKE ONE TABLET BY MOUTH EVERY DAY  30 tablet  0  . pravastatin (PRAVACHOL) 80 MG tablet TAKE ONE TABLET BY MOUTH EVERY DAY  30 tablet  0  . PROAIR HFA 108 (90 BASE) MCG/ACT inhaler INHALE TWO PUFFS EVERY 4 HOURS AS NEEDED FOR WHEEZING  9 g  2     Review of Systems Review of Systems  Constitutional: Negative for fever, appetite change, fatigue and unexpected weight change.  Eyes: Negative for pain and visual disturbance.  Respiratory: Negative for cough and shortness of  breath.   Cardiovascular: Negative for cp or palpitations    Gastrointestinal: Negative for nausea, diarrhea and constipation.  Genitourinary: Negative for urgency and frequency.  Skin: Negative for pallor or rash   Neurological: Negative for weakness, light-headedness, numbness and headaches.  Hematological: Negative for adenopathy. Does not bruise/bleed easily.  Psychiatric/Behavioral: Negative for dysphoric mood. The patient is not nervous/anxious.  pos for stressors        Objective:   Physical Exam  Constitutional: She appears well-developed and well-nourished. No distress.  HENT:  Head: Normocephalic and atraumatic.  Mouth/Throat: Oropharynx is clear and moist.  Eyes: Conjunctivae normal and EOM are normal. Pupils are equal, round, and reactive to light. Right eye exhibits no discharge. Left eye exhibits no discharge. No scleral icterus.   Neck: Normal range of motion. Neck supple. No JVD present. No thyromegaly present.  Cardiovascular: Normal rate, regular rhythm, normal heart sounds and intact distal pulses.  Exam reveals no gallop.   Pulmonary/Chest: Effort normal and breath sounds normal. No respiratory distress. She has no wheezes.  Abdominal: Soft. Bowel sounds are normal. She exhibits no distension and no mass. There is no tenderness.  Musculoskeletal: She exhibits no edema and no tenderness.  Lymphadenopathy:    She has no cervical adenopathy.  Neurological: She is alert. She has normal reflexes. No cranial nerve deficit. She exhibits normal muscle tone. Coordination normal.  Skin: Skin is warm and dry. No rash noted. No erythema. No pallor.  Psychiatric: She has a normal mood and affect.          Assessment & Plan:

## 2012-01-16 NOTE — Patient Instructions (Signed)
Call the [facility] breast center at [facility] to schedule mammogram  Labs today Avoid red meat/ fried foods/ egg yolks/ fatty breakfast meats/ butter, cheese and high fat dairy/ and shellfish   Keep exercising - try to mix up what you are doing  No change in medicine

## 2012-01-16 NOTE — Assessment & Plan Note (Signed)
Lipids today Rev diet -low sat fat / and need for exercise Refilled pravachol

## 2012-01-22 ENCOUNTER — Other Ambulatory Visit (INDEPENDENT_AMBULATORY_CARE_PROVIDER_SITE_OTHER): Payer: BC Managed Care – PPO

## 2012-01-22 DIAGNOSIS — R7309 Other abnormal glucose: Secondary | ICD-10-CM

## 2012-01-22 DIAGNOSIS — R739 Hyperglycemia, unspecified: Secondary | ICD-10-CM

## 2012-01-22 LAB — HEMOGLOBIN A1C: Hgb A1c MFr Bld: 6.3 % (ref 4.6–6.5)

## 2012-01-25 ENCOUNTER — Encounter: Payer: Self-pay | Admitting: *Deleted

## 2012-03-27 ENCOUNTER — Encounter: Payer: Self-pay | Admitting: Family Medicine

## 2012-03-27 ENCOUNTER — Ambulatory Visit (INDEPENDENT_AMBULATORY_CARE_PROVIDER_SITE_OTHER): Payer: BC Managed Care – PPO | Admitting: Family Medicine

## 2012-03-27 VITALS — BP 132/88 | HR 84 | Temp 99.3°F | Ht 64.25 in | Wt 179.2 lb

## 2012-03-27 DIAGNOSIS — J0100 Acute maxillary sinusitis, unspecified: Secondary | ICD-10-CM

## 2012-03-27 MED ORDER — AMOXICILLIN-POT CLAVULANATE 875-125 MG PO TABS: 1.0000 | ORAL_TABLET | Freq: Two times a day (BID) | ORAL | Status: DC

## 2012-03-27 MED ORDER — GUAIFENESIN-CODEINE 100-10 MG/5ML PO SYRP: 5.0000 mL | ORAL_SOLUTION | Freq: Four times a day (QID) | ORAL | Status: DC | PRN

## 2012-03-27 NOTE — Patient Instructions (Addendum)
For sinus infection- take the augmentin as directed Fluids/ rest/nasal saline spray Try the cough medicine at night Update if not starting to improve in a week or if worsening

## 2012-03-27 NOTE — Progress Notes (Signed)
Subjective:    Patient ID: Connie Rowe, female    DOB: 01-Jul-1950, 62 y.o.   MRN: [id number]  HPI Here for uri - symptoms for over 2 weeks Bad headaches every day- facial pain with pain in teeth  Cough is all night long - non prod A lot of post nasal drainage Low grade temp  Cannot get a lot out of her nose- too congested   Got a little better and then worse again   Taking delsym Benadryl for sinus pain formula   Patient Active Problem List  Diagnosis  . HELICOBACTER PYLORI INFECTION  . HYPERLIPIDEMIA  . DEPRESSION  . EXTERNAL HEMORRHOIDS  . ESOPHAGEAL STRICTURE  . GERD  . HIATAL HERNIA  . DIVERTICULOSIS, COLON  . OVERACTIVE BLADDER  . HEMATURIA, MICROSCOPIC  . OSTEOPENIA  . URINARY INCONTINENCE, STRESS  . GAMMA GLUTAMYL TRANSFERASE, SERUM, ELEVATED  . PANCREATITIS, HX OF  . Other screening mammogram  . Routine general medical examination at a health care facility  . Hirsutism   Past Medical History  Diagnosis Date  . Depression   . GERD (gastroesophageal reflux disease)   . Hyperlipidemia   . Pancreatitis     History of  . Osteopenia   . Esophageal stricture    Past Surgical History  Procedure Laterality Date  . Tubal ligation    . Tonsillectomy    . Bladder repair     History  Substance Use Topics  . Smoking status: Never Smoker   . Smokeless tobacco: Not on file  . Alcohol Use: No   Family History  Problem Relation Age of Onset  . Stroke Mother   . Hypertension Mother   . Cancer Mother     colon CA  . Drug abuse Son   . Stroke Brother    No Known Allergies Current Outpatient Prescriptions on File Prior to Visit  Medication Sig Dispense Refill  . Calcium Carbonate-Vitamin D (CALCIUM-VITAMIN D) 500-200 MG-UNIT per tablet Take 1 tablet by mouth daily.        . diphenhydrAMINE (BENADRYL) 25 MG tablet Take 25 mg by mouth every 6 (six) hours as needed.        Marland Kitchen FLUoxetine (PROZAC) 20 MG capsule Take 1 capsule (20 mg total) by mouth daily.  30  capsule  11  . meloxicam (MOBIC) 15 MG tablet Take 1 tablet (15 mg total) by mouth daily as needed for pain.  30 tablet  11  . pantoprazole (PROTONIX) 40 MG tablet Take 1 tablet (40 mg total) by mouth daily.  30 tablet  11  . pravastatin (PRAVACHOL) 80 MG tablet Take 1 tablet (80 mg total) by mouth daily.  30 tablet  11  . PROAIR HFA 108 (90 BASE) MCG/ACT inhaler INHALE TWO PUFFS EVERY 4 HOURS AS NEEDED FOR WHEEZING  9 g  2   No current facility-administered medications on file prior to visit.      Review of Systems Review of Systems  Constitutional: Negative for, appetite change,  and unexpected weight change.  ENT neg for ear pain or drainage, neg for facial swelling  Eyes: Negative for pain and visual disturbance.  Respiratory: Negative for wheeze and shortness of breath.   Cardiovascular: Negative for cp or palpitations    Gastrointestinal: Negative for nausea, diarrhea and constipation.  Genitourinary: Negative for urgency and frequency.  Skin: Negative for pallor or rash   Neurological: Negative for weakness, light-headedness, numbness and headaches.  Hematological: Negative for adenopathy. Does not bruise/bleed easily.  Psychiatric/Behavioral: Negative for dysphoric mood. The patient is not nervous/anxious.         Objective:   Physical Exam  Constitutional: She appears well-developed and well-nourished. No distress.  HENT:  Head: Normocephalic and atraumatic.  Right Ear: External ear normal.  Left Ear: External ear normal.  Mouth/Throat: Oropharynx is clear and moist. No oropharyngeal exudate.  Nares are injected and congested  bilat maxillary sinus tenderness  Eyes: Conjunctivae and EOM are normal. Pupils are equal, round, and reactive to light. Right eye exhibits no discharge. Left eye exhibits no discharge.  Neck: Normal range of motion. Neck supple.  Cardiovascular: Normal rate and regular rhythm.   Pulmonary/Chest: Effort normal and breath sounds normal. No  respiratory distress. She has no wheezes. She has no rales.  Lymphadenopathy:    She has no cervical adenopathy.  Neurological: She is alert.  Skin: Skin is warm and dry. No rash noted.  Psychiatric: She has a normal mood and affect.          Assessment & Plan:

## 2012-03-27 NOTE — Assessment & Plan Note (Signed)
Bacterial  / after viral uri Disc symptomatic care - see instructions on AVS tx with augmentin Robitussin ac for cough  Update if not starting to improve in a week or if worsening

## 2012-11-26 ENCOUNTER — Ambulatory Visit: Payer: BC Managed Care – PPO

## 2012-12-17 ENCOUNTER — Ambulatory Visit (INDEPENDENT_AMBULATORY_CARE_PROVIDER_SITE_OTHER): Payer: BC Managed Care – PPO

## 2012-12-17 DIAGNOSIS — Z23 Encounter for immunization: Secondary | ICD-10-CM

## 2013-01-21 ENCOUNTER — Encounter: Payer: Self-pay | Admitting: Family Medicine

## 2013-01-21 ENCOUNTER — Ambulatory Visit (INDEPENDENT_AMBULATORY_CARE_PROVIDER_SITE_OTHER): Payer: BC Managed Care – PPO | Admitting: Family Medicine

## 2013-01-21 VITALS — BP 118/76 | HR 81 | Temp 98.0°F | Ht 64.25 in | Wt 183.8 lb

## 2013-01-21 DIAGNOSIS — R05 Cough: Secondary | ICD-10-CM

## 2013-01-21 DIAGNOSIS — K219 Gastro-esophageal reflux disease without esophagitis: Secondary | ICD-10-CM

## 2013-01-21 DIAGNOSIS — R059 Cough, unspecified: Secondary | ICD-10-CM | POA: Insufficient documentation

## 2013-01-21 MED ORDER — GUAIFENESIN-CODEINE 100-10 MG/5ML PO SYRP: 5.0000 mL | ORAL_SOLUTION | Freq: Four times a day (QID) | ORAL | Status: DC | PRN

## 2013-01-21 MED ORDER — PANTOPRAZOLE SODIUM 40 MG PO TBEC: 40.0000 mg | DELAYED_RELEASE_TABLET | Freq: Two times a day (BID) | ORAL | Status: DC

## 2013-01-21 MED ORDER — BENZONATATE 200 MG PO CAPS: 200.0000 mg | ORAL_CAPSULE | Freq: Three times a day (TID) | ORAL | Status: DC | PRN

## 2013-01-21 NOTE — Progress Notes (Signed)
Subjective:    Patient ID: Connie Rowe, female    DOB: 07-22-1950, 62 y.o.   MRN: [id number]  HPI Here with a bad cough with uri symptoms  Started a month ago- she treated it otc -- got better/ ? If she had a cold  A week later it came back - with a vengence   Cough is dry and continuous and worse at night  Little runny nose  A little nasal cong Mod post nasal drip  Ears itch and feel stopped up -no pain No ST  A little dizzy  Some pain under eyes -on and off   No colored mucous at this time   Taking mucinex DM or delsym  Benadryl at night  Does not have any px cough med   Takes protonix - daily  otc zantac generic (she thinks)  Still having heartburn despite that - at night   meloxicam- just re started due to chronic hip pain   Patient Active Problem List   Diagnosis Date Noted  . Sinusitis, acute maxillary 03/27/2012  . Hirsutism 12/06/2010  . Routine general medical examination at a health care facility 09/10/2010  . Other screening mammogram 09/01/2010  . ESOPHAGEAL STRICTURE 04/17/2008  . OSTEOPENIA 10/22/2006  . URINARY INCONTINENCE, STRESS 08/22/2006  . HELICOBACTER PYLORI INFECTION 04/18/2006  . HYPERLIPIDEMIA 04/18/2006  . DEPRESSION 04/18/2006  . EXTERNAL HEMORRHOIDS 04/18/2006  . GERD 04/18/2006  . HIATAL HERNIA 04/18/2006  . OVERACTIVE BLADDER 04/18/2006  . HEMATURIA, MICROSCOPIC 04/18/2006  . GAMMA GLUTAMYL TRANSFERASE, SERUM, ELEVATED 04/18/2006  . PANCREATITIS, HX OF 04/18/2006  . DIVERTICULOSIS, COLON 01/02/2000   Past Medical History  Diagnosis Date  . Depression   . GERD (gastroesophageal reflux disease)   . Hyperlipidemia   . Pancreatitis     History of  . Osteopenia   . Esophageal stricture    Past Surgical History  Procedure Laterality Date  . Tubal ligation    . Tonsillectomy    . Bladder repair     History  Substance Use Topics  . Smoking status: Never Smoker   . Smokeless tobacco: Not on file  . Alcohol Use: Yes    Comment: rare/socially   Family History  Problem Relation Age of Onset  . Stroke Mother   . Hypertension Mother   . Cancer Mother     colon CA  . Drug abuse Son   . Stroke Brother    No Known Allergies Current Outpatient Prescriptions on File Prior to Visit  Medication Sig Dispense Refill  . Calcium Carbonate-Vitamin D (CALCIUM-VITAMIN D) 500-200 MG-UNIT per tablet Take 1 tablet by mouth daily.        . diphenhydrAMINE (BENADRYL) 25 MG tablet Take 25 mg by mouth every 6 (six) hours as needed.        Marland Kitchen FLUoxetine (PROZAC) 20 MG capsule Take 1 capsule (20 mg total) by mouth daily.  30 capsule  11  . meloxicam (MOBIC) 15 MG tablet Take 1 tablet (15 mg total) by mouth daily as needed for pain.  30 tablet  11  . pantoprazole (PROTONIX) 40 MG tablet Take 1 tablet (40 mg total) by mouth daily.  30 tablet  11  . pravastatin (PRAVACHOL) 80 MG tablet Take 1 tablet (80 mg total) by mouth daily.  30 tablet  11  . PROAIR HFA 108 (90 BASE) MCG/ACT inhaler INHALE TWO PUFFS EVERY 4 HOURS AS NEEDED FOR WHEEZING  9 g  2  . guaiFENesin-codeine (ROBITUSSIN AC) 100-10  MG/5ML syrup Take 5 mLs by mouth 4 (four) times daily as needed for cough.  120 mL  0   No current facility-administered medications on file prior to visit.     Review of Systems Review of Systems  Constitutional: Negative for fever, appetite change, fatigue and unexpected weight change.  ENT pos for rhinorrhea and drip / neg for sinus pain  Eyes: Negative for pain and visual disturbance.  Respiratory: Negative for wheeze  and shortness of breath.   Cardiovascular: Negative for cp or palpitations    Gastrointestinal: Negative for nausea, diarrhea and constipation. pos for acid reflux  Genitourinary: Negative for urgency and frequency.  Skin: Negative for pallor or rash   Neurological: Negative for weakness, light-headedness, numbness and headaches.  Hematological: Negative for adenopathy. Does not bruise/bleed easily.    Psychiatric/Behavioral: Negative for dysphoric mood. The patient is not nervous/anxious.         Objective:   Physical Exam  Constitutional: She appears well-developed and well-nourished. No distress.  obese and well appearing   HENT:  Head: Normocephalic and atraumatic.  Right Ear: External ear normal.  Left Ear: External ear normal.  Mouth/Throat: Oropharynx is clear and moist. No oropharyngeal exudate.  Nares are boggy but clear   Eyes: Conjunctivae and EOM are normal. Pupils are equal, round, and reactive to light. Right eye exhibits no discharge. Left eye exhibits no discharge. No scleral icterus.  Neck: Normal range of motion. Neck supple.  Cardiovascular: Normal rate, regular rhythm, normal heart sounds and intact distal pulses.  Exam reveals no gallop.   Pulmonary/Chest: Effort normal and breath sounds normal. No respiratory distress. She has no wheezes. She has no rales.  No rales or rhonchi  Abdominal: Soft. Bowel sounds are normal. She exhibits no distension and no mass. There is no tenderness. There is no rebound and no guarding.  Lymphadenopathy:    She has no cervical adenopathy.  Neurological: She is alert. She has normal reflexes.  Skin: Skin is warm and dry. No rash noted.  Psychiatric: She has a normal mood and affect.          Assessment & Plan:

## 2013-01-21 NOTE — Assessment & Plan Note (Signed)
Uncontrolled and likely worsening cough Increase protonix to bid and f/u planned Ranitidine if needed Anti reflux diet  Update if no improvement

## 2013-01-21 NOTE — Progress Notes (Signed)
Pre-visit discussion using our clinic review tool. No additional management support is needed unless otherwise documented below in the visit note.  

## 2013-01-21 NOTE — Assessment & Plan Note (Signed)
Likely multifactorial - with uri viral and also out of control GERD Tessalon tid prn  Robitussin AC prn when not working or driving to stop cough cycle  Update if not starting to improve in a week or if worsening

## 2013-01-21 NOTE — Patient Instructions (Signed)
For cough and reflux -increase your protonix to twice daily  Use ranitidine over the counter if needed  Try tessalon pills - up to three times daily for cough  Codeine cough syrup when needed with caution  Update if not starting to improve in a week or if worsening  Follow up with me in 1-2 months

## 2013-02-10 ENCOUNTER — Other Ambulatory Visit: Payer: Self-pay | Admitting: Family Medicine

## 2013-02-11 NOTE — Telephone Encounter (Signed)
Electronic refill request, please advise  

## 2013-02-11 NOTE — Telephone Encounter (Signed)
Please schedule f/u for spring and refill until then  Thanks

## 2013-02-12 NOTE — Telephone Encounter (Signed)
F/u scheduled for 04/15/13 and med refilled

## 2013-03-06 ENCOUNTER — Ambulatory Visit: Payer: Self-pay | Admitting: Family Medicine

## 2013-03-07 ENCOUNTER — Encounter: Payer: Self-pay | Admitting: Family Medicine

## 2013-03-10 ENCOUNTER — Encounter: Payer: Self-pay | Admitting: *Deleted

## 2013-04-15 ENCOUNTER — Encounter: Payer: Self-pay | Admitting: Family Medicine

## 2013-04-15 ENCOUNTER — Ambulatory Visit (INDEPENDENT_AMBULATORY_CARE_PROVIDER_SITE_OTHER): Payer: BC Managed Care – PPO | Admitting: Family Medicine

## 2013-04-15 VITALS — BP 125/80 | HR 74 | Temp 98.4°F | Ht 64.25 in | Wt 183.0 lb

## 2013-04-15 DIAGNOSIS — E785 Hyperlipidemia, unspecified: Secondary | ICD-10-CM

## 2013-04-15 DIAGNOSIS — F3289 Other specified depressive episodes: Secondary | ICD-10-CM

## 2013-04-15 DIAGNOSIS — F329 Major depressive disorder, single episode, unspecified: Secondary | ICD-10-CM

## 2013-04-15 DIAGNOSIS — K219 Gastro-esophageal reflux disease without esophagitis: Secondary | ICD-10-CM

## 2013-04-15 LAB — COMPREHENSIVE METABOLIC PANEL
ALK PHOS: 79 U/L (ref 39–117)
ALT: 22 U/L (ref 0–35)
AST: 19 U/L (ref 0–37)
Albumin: 4.4 g/dL (ref 3.5–5.2)
BILIRUBIN TOTAL: 0.6 mg/dL (ref 0.3–1.2)
BUN: 13 mg/dL (ref 6–23)
CO2: 27 mEq/L (ref 19–32)
CREATININE: 1.1 mg/dL (ref 0.4–1.2)
Calcium: 9.2 mg/dL (ref 8.4–10.5)
Chloride: 102 mEq/L (ref 96–112)
GFR: 54.75 mL/min — ABNORMAL LOW (ref >60.00)
GLUCOSE: 126 mg/dL — AB (ref 70–99)
Potassium: 4.3 mEq/L (ref 3.5–5.1)
SODIUM: 138 meq/L (ref 135–145)
TOTAL PROTEIN: 7.6 g/dL (ref 6.0–8.3)

## 2013-04-15 LAB — CBC WITH DIFFERENTIAL/PLATELET
BASOS ABS: 0 10*3/uL (ref 0.0–0.1)
Basophils Relative: 0.4 % (ref 0.0–3.0)
EOS ABS: 0.1 10*3/uL (ref 0.0–0.7)
Eosinophils Relative: 2.9 % (ref 0.0–5.0)
HCT: 40.7 % (ref 36.0–46.0)
Hemoglobin: 13.4 g/dL (ref 12.0–15.0)
LYMPHS PCT: 39.8 % (ref 12.0–46.0)
Lymphs Abs: 2 10*3/uL (ref 0.7–4.0)
MCHC: 33 g/dL (ref 30.0–36.0)
MCV: 90.3 fl (ref 78.0–100.0)
MONO ABS: 0.4 10*3/uL (ref 0.1–1.0)
Monocytes Relative: 8.6 % (ref 3.0–12.0)
NEUTROS PCT: 48.3 % (ref 43.0–77.0)
Neutro Abs: 2.5 10*3/uL (ref 1.4–7.7)
PLATELETS: 242 10*3/uL (ref 150.0–400.0)
RBC: 4.51 Mil/uL (ref 3.87–5.11)
RDW: 14.9 % — AB (ref 11.5–14.6)
WBC: 5.1 10*3/uL (ref 4.5–10.5)

## 2013-04-15 LAB — LIPID PANEL
CHOLESTEROL: 192 mg/dL (ref 0–200)
HDL: 61.7 mg/dL (ref >39.00)
LDL CALC: 96 mg/dL (ref 0–99)
TRIGLYCERIDES: 170 mg/dL — AB (ref 0.0–149.0)
Total CHOL/HDL Ratio: 3
VLDL: 34 mg/dL (ref 0.0–40.0)

## 2013-04-15 MED ORDER — PANTOPRAZOLE SODIUM 40 MG PO TBEC: 40.0000 mg | DELAYED_RELEASE_TABLET | Freq: Two times a day (BID) | ORAL | Status: DC

## 2013-04-15 MED ORDER — FLUOXETINE HCL 20 MG PO CAPS: ORAL_CAPSULE | ORAL | Status: DC

## 2013-04-15 MED ORDER — PRAVASTATIN SODIUM 80 MG PO TABS: ORAL_TABLET | ORAL | Status: DC

## 2013-04-15 NOTE — Progress Notes (Signed)
Pre visit review using our clinic review tool, if applicable. No additional management support is needed unless otherwise documented below in the visit note. 

## 2013-04-15 NOTE — Patient Instructions (Signed)
Keep up healthy diet and exercise  Lab today

## 2013-04-15 NOTE — Progress Notes (Signed)
Subjective:    Patient ID: Connie Rowe, female    DOB: 1950-05-04, 63 y.o.   MRN: [id number]  HPI Here for f/u of chronic medical problems   BP Readings from Last 3 Encounters:  04/15/13 142/90  01/21/13 118/76  03/27/12 132/88   pt thinks she is not used to getting up this early and she is tired -did not sleep well  Mother had high blood pressure  Wt is stable with bmi of 31  Is eating well and getting exercise  Walks and uses some stretch bands (has an exercise hula hoop also)  In Feb was sitting with husband in the hospital after his surgery  He had to have a kidney removed (had cancer)- did not spread   Hyperlipidemia Lab Results  Component Value Date   CHOL 181 01/16/2012   HDL 52.90 01/16/2012   LDLCALC 103* 01/16/2012   LDLDIRECT 122.4 09/14/2010   TRIG 126.0 01/16/2012   CHOLHDL 3 01/16/2012   due for labs  On pravastatin and diet She stays away from fatty foods most of the time    GERD Stricture in past  On protonix- a whole lot better since she started taking 2 per day (one a day does not do it) Also no mobic - and that made a difference   Depression On prozac -that still works fairly well  She has a lot of stress- raising grandson - husband's health problems also   Patient Active Problem List   Diagnosis Date Noted  . Cough 01/21/2013  . Sinusitis, acute maxillary 03/27/2012  . Hirsutism 12/06/2010  . Routine general medical examination at a health care facility 09/10/2010  . Other screening mammogram 09/01/2010  . ESOPHAGEAL STRICTURE 04/17/2008  . OSTEOPENIA 10/22/2006  . URINARY INCONTINENCE, STRESS 08/22/2006  . HELICOBACTER PYLORI INFECTION 04/18/2006  . HYPERLIPIDEMIA 04/18/2006  . DEPRESSION 04/18/2006  . EXTERNAL HEMORRHOIDS 04/18/2006  . GERD 04/18/2006  . HIATAL HERNIA 04/18/2006  . OVERACTIVE BLADDER 04/18/2006  . HEMATURIA, MICROSCOPIC 04/18/2006  . GAMMA GLUTAMYL TRANSFERASE, SERUM, ELEVATED 04/18/2006  . PANCREATITIS, HX OF  04/18/2006  . DIVERTICULOSIS, COLON 01/02/2000   Past Medical History  Diagnosis Date  . Depression   . GERD (gastroesophageal reflux disease)   . Hyperlipidemia   . Pancreatitis     History of  . Osteopenia   . Esophageal stricture    Past Surgical History  Procedure Laterality Date  . Tubal ligation    . Tonsillectomy    . Bladder repair     History  Substance Use Topics  . Smoking status: Never Smoker   . Smokeless tobacco: Not on file  . Alcohol Use: Yes     Comment: rare/socially   Family History  Problem Relation Age of Onset  . Stroke Mother   . Hypertension Mother   . Cancer Mother     colon CA  . Drug abuse Son   . Stroke Brother    No Known Allergies Current Outpatient Prescriptions on File Prior to Visit  Medication Sig Dispense Refill  . benzonatate (TESSALON) 200 MG capsule Take 1 capsule (200 mg total) by mouth 3 (three) times daily as needed for cough (swallow whole).  30 capsule  1  . Calcium Carbonate-Vitamin D (CALCIUM-VITAMIN D) 500-200 MG-UNIT per tablet Take 1 tablet by mouth daily.        . diphenhydrAMINE (BENADRYL) 25 MG tablet Take 25 mg by mouth every 6 (six) hours as needed.        Marland Kitchen  FLUoxetine (PROZAC) 20 MG capsule TAKE ONE CAPSULE BY MOUTH EVERY DAY  30 capsule  2  . guaiFENesin-codeine (ROBITUSSIN AC) 100-10 MG/5ML syrup Take 5 mLs by mouth 4 (four) times daily as needed for cough.  120 mL  0  . guaiFENesin-codeine (ROBITUSSIN AC) 100-10 MG/5ML syrup Take 5 mLs by mouth 4 (four) times daily as needed for cough.  120 mL  0  . pantoprazole (PROTONIX) 40 MG tablet Take 1 tablet (40 mg total) by mouth 2 (two) times daily.  60 tablet  11  . pravastatin (PRAVACHOL) 80 MG tablet TAKE ONE TABLET BY MOUTH EVERY DAY  30 tablet  2  . PROAIR HFA 108 (90 BASE) MCG/ACT inhaler INHALE TWO PUFFS EVERY 4 HOURS AS NEEDED FOR WHEEZING  9 g  2   No current facility-administered medications on file prior to visit.     Review of Systems Review of Systems    Constitutional: Negative for fever, appetite change, fatigue and unexpected weight change.  Eyes: Negative for pain and visual disturbance.  Respiratory: Negative for cough and shortness of breath.   Cardiovascular: Negative for cp or palpitations    Gastrointestinal: Negative for nausea, diarrhea and constipation.  Genitourinary: Negative for urgency and frequency.  Skin: Negative for pallor or rash   Neurological: Negative for weakness, light-headedness, numbness and headaches.  Hematological: Negative for adenopathy. Does not bruise/bleed easily.  Psychiatric/Behavioral: Negative for dysphoric mood. The patient is not nervous/anxious.         Objective:   Physical Exam  Constitutional: She appears well-developed and well-nourished. No distress.  HENT:  Head: Normocephalic and atraumatic.  Right Ear: External ear normal.  Left Ear: External ear normal.  Nose: Nose normal.  Mouth/Throat: Oropharynx is clear and moist.  Eyes: Conjunctivae and EOM are normal. Pupils are equal, round, and reactive to light. Right eye exhibits no discharge. Left eye exhibits no discharge. No scleral icterus.  Neck: Normal range of motion. Neck supple. No JVD present. Carotid bruit is not present. No thyromegaly present.  Cardiovascular: Normal rate, regular rhythm, normal heart sounds and intact distal pulses.  Exam reveals no gallop.   Pulmonary/Chest: Effort normal and breath sounds normal. No respiratory distress. She has no wheezes. She has no rales.  Abdominal: Soft. Bowel sounds are normal. She exhibits no distension, no abdominal bruit and no mass. There is no tenderness.  Musculoskeletal: She exhibits no edema and no tenderness.  Lymphadenopathy:    She has no cervical adenopathy.  Neurological: She is alert. She has normal reflexes. No cranial nerve deficit. She exhibits normal muscle tone. Coordination normal.  Skin: Skin is warm and dry. No rash noted. No erythema. No pallor.  Psychiatric:  She has a normal mood and affect.          Assessment & Plan:

## 2013-04-16 ENCOUNTER — Ambulatory Visit: Payer: BC Managed Care – PPO

## 2013-04-16 DIAGNOSIS — R7309 Other abnormal glucose: Secondary | ICD-10-CM

## 2013-04-16 LAB — HEMOGLOBIN A1C: HEMOGLOBIN A1C: 6.5 % (ref 4.6–6.5)

## 2013-04-16 NOTE — Assessment & Plan Note (Signed)
With hx of stricture in the past  Will continue to tx with protonix bid (did not get symptom relief with daily tx)  Rev gerd diet

## 2013-04-16 NOTE — Assessment & Plan Note (Signed)
Doing well on prozac  Stress has decreased Enc exercise and socialization

## 2013-04-16 NOTE — Assessment & Plan Note (Signed)
Lipids today Disc goal for lipids Rev low sat fat diet  Taking pravastatin

## 2013-04-17 ENCOUNTER — Encounter: Payer: Self-pay | Admitting: Family Medicine

## 2013-04-17 DIAGNOSIS — R7303 Prediabetes: Secondary | ICD-10-CM | POA: Insufficient documentation

## 2013-04-17 DIAGNOSIS — E119 Type 2 diabetes mellitus without complications: Secondary | ICD-10-CM | POA: Insufficient documentation

## 2013-04-21 ENCOUNTER — Encounter: Payer: Self-pay | Admitting: *Deleted

## 2013-07-07 ENCOUNTER — Encounter: Payer: Self-pay | Admitting: Family Medicine

## 2013-07-07 ENCOUNTER — Ambulatory Visit (INDEPENDENT_AMBULATORY_CARE_PROVIDER_SITE_OTHER): Payer: Commercial Managed Care - HMO | Admitting: Family Medicine

## 2013-07-07 VITALS — BP 96/62 | HR 82 | Temp 98.3°F | Ht 64.25 in | Wt 168.0 lb

## 2013-07-07 DIAGNOSIS — J069 Acute upper respiratory infection, unspecified: Secondary | ICD-10-CM

## 2013-07-07 DIAGNOSIS — B9789 Other viral agents as the cause of diseases classified elsewhere: Principal | ICD-10-CM

## 2013-07-07 MED ORDER — PANTOPRAZOLE SODIUM 40 MG PO TBEC: 40.0000 mg | DELAYED_RELEASE_TABLET | Freq: Two times a day (BID) | ORAL | Status: DC

## 2013-07-07 MED ORDER — AZITHROMYCIN 250 MG PO TABS: ORAL_TABLET | ORAL | Status: DC

## 2013-07-07 MED ORDER — FLUOXETINE HCL 20 MG PO CAPS: ORAL_CAPSULE | ORAL | Status: DC

## 2013-07-07 MED ORDER — ALBUTEROL SULFATE HFA 108 (90 BASE) MCG/ACT IN AERS: INHALATION_SPRAY | RESPIRATORY_TRACT | Status: DC

## 2013-07-07 MED ORDER — PRAVASTATIN SODIUM 80 MG PO TABS: ORAL_TABLET | ORAL | Status: DC

## 2013-07-07 MED ORDER — GUAIFENESIN-CODEINE 100-10 MG/5ML PO SYRP: 5.0000 mL | ORAL_SOLUTION | Freq: Four times a day (QID) | ORAL | Status: DC | PRN

## 2013-07-07 NOTE — Patient Instructions (Signed)
Drink lots of fluids and rest  Try the robitussin ac for cough-watch out for sedation  I sent inhaler to your pharmacy  Fill the zithromax in 2-3 days if no further improvement or if worse Keep me posted

## 2013-07-07 NOTE — Assessment & Plan Note (Signed)
Disc symptomatic care - see instructions on AVS  Px robitussin ac for use with caution of sedation  Albuterol mdi prn  If no imp in 2-3 d or worse-will update and fill px for zpak Update if not starting to improve in a week or if worsening

## 2013-07-07 NOTE — Progress Notes (Signed)
Subjective:    Patient ID: Connie Rowe, female    DOB: 04-02-1950, 63 y.o.   MRN: [id number]  HPI Here for uri symptoms  About a week Getting worse instead of better  Cough is dry - but junky sounding - and she can't get mucous all the way up  Has a fever blister on lip also  Ears-pressure / no pain  Throat hurt at first - not now  Nasal congestion with some headaches   No fever   Grandson had it first   Patient Active Problem List   Diagnosis Date Noted  . Hyperglycemia 04/17/2013  . Hirsutism 12/06/2010  . Routine general medical examination at a health care facility 09/10/2010  . Other screening mammogram 09/01/2010  . OSTEOPENIA 10/22/2006  . URINARY INCONTINENCE, STRESS 08/22/2006  . History of Helicobacter pylori infection 04/18/2006  . HYPERLIPIDEMIA 04/18/2006  . DEPRESSION 04/18/2006  . EXTERNAL HEMORRHOIDS 04/18/2006  . GERD 04/18/2006  . HIATAL HERNIA 04/18/2006  . OVERACTIVE BLADDER 04/18/2006  . HEMATURIA, MICROSCOPIC 04/18/2006  . GAMMA GLUTAMYL TRANSFERASE, SERUM, ELEVATED 04/18/2006  . PANCREATITIS, HX OF 04/18/2006  . DIVERTICULOSIS, COLON 01/02/2000   Past Medical History  Diagnosis Date  . Depression   . GERD (gastroesophageal reflux disease)   . Hyperlipidemia   . Pancreatitis     History of  . Osteopenia   . Esophageal stricture    Past Surgical History  Procedure Laterality Date  . Tubal ligation    . Tonsillectomy    . Bladder repair     History  Substance Use Topics  . Smoking status: Never Smoker   . Smokeless tobacco: Not on file  . Alcohol Use: Yes     Comment: rare/socially   Family History  Problem Relation Age of Onset  . Stroke Mother   . Hypertension Mother   . Cancer Mother     colon CA  . Drug abuse Son   . Stroke Brother    No Known Allergies Current Outpatient Prescriptions on File Prior to Visit  Medication Sig Dispense Refill  . Acetaminophen (TYLENOL ARTHRITIS PAIN PO) Take 2 tablets by mouth as  needed.      . Calcium Carbonate-Vitamin D (CALCIUM-VITAMIN D) 500-200 MG-UNIT per tablet Take 1 tablet by mouth daily.        . diphenhydrAMINE (BENADRYL) 25 MG tablet Take 25 mg by mouth every 6 (six) hours as needed.        Marland Kitchen FLUoxetine (PROZAC) 20 MG capsule TAKE ONE CAPSULE BY MOUTH EVERY DAY  90 capsule  3  . pantoprazole (PROTONIX) 40 MG tablet Take 1 tablet (40 mg total) by mouth 2 (two) times daily.  180 tablet  3  . pravastatin (PRAVACHOL) 80 MG tablet TAKE ONE TABLET BY MOUTH EVERY DAY  90 tablet  3  . PROAIR HFA 108 (90 BASE) MCG/ACT inhaler INHALE TWO PUFFS EVERY 4 HOURS AS NEEDED FOR WHEEZING  9 g  2   No current facility-administered medications on file prior to visit.    Review of Systems Review of Systems  Constitutional: Negative for fever, appetite change,  and unexpected weight change. pos for fatigue ENT pos for congestion and rhinorrhea and drip/ neg for sinus pain  Eyes: Negative for pain and visual disturbance.  Respiratory: Negative for shortness of breath.   Cardiovascular: Negative for cp or palpitations    Gastrointestinal: Negative for nausea, diarrhea and constipation.  Genitourinary: Negative for urgency and frequency.  Skin: Negative for pallor  or rash   Neurological: Negative for weakness, light-headedness, numbness and headaches.  Hematological: Negative for adenopathy. Does not bruise/bleed easily.  Psychiatric/Behavioral: Negative for dysphoric mood. The patient is not nervous/anxious.         Objective:   Physical Exam  Constitutional: She appears well-developed and well-nourished. No distress.  HENT:  Head: Normocephalic and atraumatic.  Right Ear: External ear normal.  Left Ear: External ear normal.  Mouth/Throat: Oropharynx is clear and moist. No oropharyngeal exudate.  Nares are injected and congested  No sinus tenderness  Healing cold sore on mid lower lip  Eyes: Conjunctivae and EOM are normal. Pupils are equal, round, and reactive to  light. Right eye exhibits no discharge. Left eye exhibits no discharge.  Neck: Normal range of motion. Neck supple.  Cardiovascular: Normal rate and regular rhythm.   Pulmonary/Chest: Effort normal and breath sounds normal. No respiratory distress. She has no wheezes. She has no rales.  Few scattered rhonchi Good air exch  Lymphadenopathy:    She has no cervical adenopathy.  Neurological: She is alert.  Skin: Skin is warm and dry. No rash noted.  Psychiatric: She has a normal mood and affect.          Assessment & Plan:   Problem List Items Addressed This Visit     Respiratory   Viral URI with cough - Primary     Disc symptomatic care - see instructions on AVS  Px robitussin ac for use with caution of sedation  Albuterol mdi prn  If no imp in 2-3 d or worse-will update and fill px for zpak Update if not starting to improve in a week or if worsening      Relevant Medications      azithromycin (ZITHROMAX) tablet

## 2013-07-07 NOTE — Progress Notes (Signed)
Pre visit review using our clinic review tool, if applicable. No additional management support is needed unless otherwise documented below in the visit note. 

## 2013-08-26 ENCOUNTER — Telehealth: Payer: Self-pay

## 2013-08-26 NOTE — Telephone Encounter (Signed)
Does she want me to send it to [facility]? Can they give it to her there or does she prefer another pharmacy?

## 2013-08-26 NOTE — Telephone Encounter (Signed)
Pt left v/m requesting written rx for shingles vaccine;pts ins co said less expensive if receive at a pharmacy.

## 2013-08-27 MED ORDER — ZOSTER VACCINE LIVE 19400 UNT/0.65ML ~~LOC~~ SOLR: 0.6500 mL | Freq: Once | SUBCUTANEOUS | Status: DC

## 2013-08-27 NOTE — Telephone Encounter (Signed)
Pt notified Rx for vaccine ready for pick up

## 2013-08-27 NOTE — Telephone Encounter (Signed)
Px printed for pick up in IN box  

## 2013-08-27 NOTE — Telephone Encounter (Signed)
Pt said she did not want it sent electronically she would like a paper Rx for the vaccine, pt request call back once it's ready for pick-up

## 2013-11-11 ENCOUNTER — Encounter: Payer: Self-pay | Admitting: Gastroenterology

## 2013-11-26 ENCOUNTER — Telehealth: Payer: Self-pay | Admitting: Family Medicine

## 2013-11-26 DIAGNOSIS — M5430 Sciatica, unspecified side: Secondary | ICD-10-CM

## 2013-11-26 NOTE — Telephone Encounter (Signed)
Pt called requesting referral to chiropractor. She already has an appt to see Dr. Annita Brod tomorrow, 11/27/13 @ 10 am. Spoke to [place] an all she needs is a dx code. Pt states her sciatica is bothering her.

## 2013-12-02 ENCOUNTER — Telehealth: Payer: Self-pay | Admitting: Family Medicine

## 2013-12-02 NOTE — Telephone Encounter (Signed)
Connie Rowe called checking on [employer] referral for [name] She saw them 10/29

## 2013-12-03 NOTE — Telephone Encounter (Signed)
[employer] Referral was completed.

## 2013-12-12 ENCOUNTER — Ambulatory Visit: Payer: Commercial Managed Care - HMO

## 2013-12-16 ENCOUNTER — Ambulatory Visit (INDEPENDENT_AMBULATORY_CARE_PROVIDER_SITE_OTHER): Payer: Commercial Managed Care - HMO

## 2013-12-16 DIAGNOSIS — Z23 Encounter for immunization: Secondary | ICD-10-CM

## 2014-02-05 DIAGNOSIS — R51 Headache: Secondary | ICD-10-CM | POA: Diagnosis not present

## 2014-02-05 DIAGNOSIS — M9902 Segmental and somatic dysfunction of thoracic region: Secondary | ICD-10-CM | POA: Diagnosis not present

## 2014-02-05 DIAGNOSIS — M5414 Radiculopathy, thoracic region: Secondary | ICD-10-CM | POA: Diagnosis not present

## 2014-02-05 DIAGNOSIS — M9901 Segmental and somatic dysfunction of cervical region: Secondary | ICD-10-CM | POA: Diagnosis not present

## 2014-02-10 ENCOUNTER — Ambulatory Visit (INDEPENDENT_AMBULATORY_CARE_PROVIDER_SITE_OTHER): Payer: Commercial Managed Care - HMO | Admitting: Family Medicine

## 2014-02-10 ENCOUNTER — Encounter: Payer: Self-pay | Admitting: Family Medicine

## 2014-02-10 VITALS — BP 126/84 | HR 82 | Temp 98.7°F | Ht 64.25 in | Wt 155.5 lb

## 2014-02-10 DIAGNOSIS — J0100 Acute maxillary sinusitis, unspecified: Secondary | ICD-10-CM

## 2014-02-10 DIAGNOSIS — J019 Acute sinusitis, unspecified: Secondary | ICD-10-CM | POA: Insufficient documentation

## 2014-02-10 DIAGNOSIS — M25551 Pain in right hip: Secondary | ICD-10-CM | POA: Insufficient documentation

## 2014-02-10 MED ORDER — AMOXICILLIN-POT CLAVULANATE 875-125 MG PO TABS: 1.0000 | ORAL_TABLET | Freq: Two times a day (BID) | ORAL | Status: DC

## 2014-02-10 MED ORDER — GUAIFENESIN-CODEINE 100-10 MG/5ML PO SYRP: 5.0000 mL | ORAL_SOLUTION | Freq: Four times a day (QID) | ORAL | Status: DC | PRN

## 2014-02-10 MED ORDER — MELOXICAM 15 MG PO TABS: 15.0000 mg | ORAL_TABLET | Freq: Every day | ORAL | Status: DC

## 2014-02-10 NOTE — Patient Instructions (Signed)
Drink lots of fluids  Take augmentin for sinus infection  Robitussin with codeine for cough as needed - caution of sedation   Take meloxicam for hip pain as needed -always take with food   Update if not starting to improve in a week or if worsening

## 2014-02-10 NOTE — Progress Notes (Signed)
Pre visit review using our clinic review tool, if applicable. No additional management support is needed unless otherwise documented below in the visit note. 

## 2014-02-10 NOTE — Assessment & Plan Note (Signed)
Cover with augmentin  Disc symptomatic care - see instructions on AVS  Robitussin AC for cough   Rest and fluids  Update if not starting to improve in a week or if worsening

## 2014-02-10 NOTE — Progress Notes (Signed)
Subjective:    Patient ID: Connie Rowe, female    DOB: 24-Jan-1951, 64 y.o.   MRN: [id number]  HPI Here with uri symptoms  Whole family has been sick   Started 2 weeks ago  Bad head congestion - comes and goes- much worse this week  Bad cough- dry hacking  Hoarse  A lot of headaches and facial pressure  Cannot get much out of her nose - it runs occasionally   otc - she takes benadryl and tylenol  Delsym for cough  No fever   She also needs a refill of meloxicam  For chronic R hip pain and back pain  Does not bother her stomach  Last took it a year ago    Lab Results  Component Value Date   CREATININE 1.1 04/15/2013   BUN 13 04/15/2013   NA 138 04/15/2013   K 4.3 04/15/2013   CL 102 04/15/2013   CO2 27 04/15/2013     Patient Active Problem List   Diagnosis Date Noted  . Sciatica 11/26/2013  . Viral URI with cough 07/07/2013  . Hyperglycemia 04/17/2013  . Hirsutism 12/06/2010  . Routine general medical examination at a health care facility 09/10/2010  . Other screening mammogram 09/01/2010  . OSTEOPENIA 10/22/2006  . URINARY INCONTINENCE, STRESS 08/22/2006  . History of Helicobacter pylori infection 04/18/2006  . HYPERLIPIDEMIA 04/18/2006  . DEPRESSION 04/18/2006  . EXTERNAL HEMORRHOIDS 04/18/2006  . GERD 04/18/2006  . HIATAL HERNIA 04/18/2006  . OVERACTIVE BLADDER 04/18/2006  . HEMATURIA, MICROSCOPIC 04/18/2006  . GAMMA GLUTAMYL TRANSFERASE, SERUM, ELEVATED 04/18/2006  . PANCREATITIS, HX OF 04/18/2006  . DIVERTICULOSIS, COLON 01/02/2000   Past Medical History  Diagnosis Date  . Depression   . GERD (gastroesophageal reflux disease)   . Hyperlipidemia   . Pancreatitis     History of  . Osteopenia   . Esophageal stricture    Past Surgical History  Procedure Laterality Date  . Tubal ligation    . Tonsillectomy    . Bladder repair     History  Substance Use Topics  . Smoking status: Never Smoker   . Smokeless tobacco: Not on file  . Alcohol  Use: 0.0 oz/week    0 Not specified per week     Comment: rare/socially   Family History  Problem Relation Age of Onset  . Stroke Mother   . Hypertension Mother   . Cancer Mother     colon CA  . Drug abuse Son   . Stroke Brother    No Known Allergies Current Outpatient Prescriptions on File Prior to Visit  Medication Sig Dispense Refill  . Acetaminophen (TYLENOL ARTHRITIS PAIN PO) Take 2 tablets by mouth as needed.    Marland Kitchen albuterol (PROAIR HFA) 108 (90 BASE) MCG/ACT inhaler INHALE TWO PUFFS EVERY 4 HOURS AS NEEDED FOR WHEEZING 9 g 5  . Calcium Carbonate-Vitamin D (CALCIUM-VITAMIN D) 500-200 MG-UNIT per tablet Take 1 tablet by mouth daily.      . diphenhydrAMINE (BENADRYL) 25 MG tablet Take 25 mg by mouth every 6 (six) hours as needed.      Marland Kitchen FLUoxetine (PROZAC) 20 MG capsule TAKE ONE CAPSULE BY MOUTH EVERY DAY 90 capsule 3  . pantoprazole (PROTONIX) 40 MG tablet Take 1 tablet (40 mg total) by mouth 2 (two) times daily. 180 tablet 3  . pravastatin (PRAVACHOL) 80 MG tablet TAKE ONE TABLET BY MOUTH EVERY DAY 90 tablet 3   No current facility-administered medications on file prior to  visit.      Review of Systems Review of Systems  Constitutional: Negative for fever, appetite change, and unexpected weight change.  ENT pos for cong and rhinorrhea and sinus pain  Eyes: Negative for pain and visual disturbance.  Respiratory: Negative for wheeze and shortness of breath.   Cardiovascular: Negative for cp or palpitations    Gastrointestinal: Negative for nausea, diarrhea and constipation.  Genitourinary: Negative for urgency and frequency.  Skin: Negative for pallor or rash   MSK pos for hip and low back pain  Neurological: Negative for weakness, light-headedness, numbness and headaches.  Hematological: Negative for adenopathy. Does not bruise/bleed easily.  Psychiatric/Behavioral: Negative for dysphoric mood. The patient is not nervous/anxious.         Objective:   Physical Exam    Constitutional: She appears well-developed and well-nourished. No distress.  HENT:  Head: Normocephalic and atraumatic.  Right Ear: External ear normal.  Left Ear: External ear normal.  Mouth/Throat: Oropharynx is clear and moist. No oropharyngeal exudate.  Nares are injected and congested  bilat maxillary sinus tenderness Throat clear   Eyes: Conjunctivae and EOM are normal. Pupils are equal, round, and reactive to light. Right eye exhibits no discharge. Left eye exhibits no discharge.  Neck: Normal range of motion. Neck supple.  Cardiovascular: Normal rate, regular rhythm and normal heart sounds.   Pulmonary/Chest: Effort normal and breath sounds normal. No respiratory distress. She has no wheezes. She has no rales.  Lymphadenopathy:    She has no cervical adenopathy.  Neurological: She is alert. No cranial nerve deficit.  Skin: Skin is warm and dry. No rash noted.  Psychiatric: She has a normal mood and affect.          Assessment & Plan:   Problem List Items Addressed This Visit      Respiratory   Acute maxillary sinusitis - Primary    Cover with augmentin  Disc symptomatic care - see instructions on AVS  Robitussin AC for cough   Rest and fluids  Update if not starting to improve in a week or if worsening        Relevant Medications   amoxicillin-clavulanate (AUGMENTIN) tablet 875-125 mg   guaiFENesin-codeine (ROBITUSSIN AC) 100-10 MG/5ML syrup     Other   Right hip pain

## 2014-03-13 ENCOUNTER — Telehealth: Payer: Self-pay | Admitting: Family Medicine

## 2014-03-13 DIAGNOSIS — M25551 Pain in right hip: Secondary | ICD-10-CM

## 2014-03-13 DIAGNOSIS — M5431 Sciatica, right side: Secondary | ICD-10-CM

## 2014-03-13 NOTE — Telephone Encounter (Signed)
I can directly refer - ask her what her symptoms are please and send back to me Does she have a particular doctor or practice she prefers?

## 2014-03-13 NOTE — Telephone Encounter (Signed)
Pt called in wanting to know if she could get a referral to an orthopedic doctor or if she needs an appointment to talk to Dr about it.  She sates that she has been going to chiropractor for 3 months with no improvement and was prescribed Mobic, but can not take it due to side effects.  Please advice if I need to schedule an appointment for a referral.

## 2014-03-13 NOTE — Telephone Encounter (Signed)
Referral done

## 2014-03-13 NOTE — Telephone Encounter (Signed)
Spoken to patient. She stated that she has a bad hip for years. Now she has sharp pain with movement down to the legs. She explained as like a tooth ache that comes and goes. It is mainly her right leg. There is some numbness at times. She does not have any particular doctor or office but don't want to drive to [place].

## 2014-03-24 ENCOUNTER — Other Ambulatory Visit: Payer: Self-pay | Admitting: Sports Medicine

## 2014-03-24 DIAGNOSIS — M5136 Other intervertebral disc degeneration, lumbar region: Secondary | ICD-10-CM | POA: Diagnosis not present

## 2014-03-24 DIAGNOSIS — S139XXA Sprain of joints and ligaments of unspecified parts of neck, initial encounter: Secondary | ICD-10-CM | POA: Diagnosis not present

## 2014-03-24 DIAGNOSIS — M5441 Lumbago with sciatica, right side: Secondary | ICD-10-CM | POA: Diagnosis not present

## 2014-03-24 DIAGNOSIS — M545 Low back pain: Secondary | ICD-10-CM

## 2014-04-11 ENCOUNTER — Ambulatory Visit
Admission: RE | Admit: 2014-04-11 | Discharge: 2014-04-11 | Disposition: A | Discharge status: Home / Self Care | Payer: Commercial Managed Care - HMO | Source: Ambulatory Visit | Attending: Sports Medicine | Admitting: Sports Medicine

## 2014-04-11 DIAGNOSIS — M47817 Spondylosis without myelopathy or radiculopathy, lumbosacral region: Secondary | ICD-10-CM | POA: Diagnosis not present

## 2014-04-11 DIAGNOSIS — M4806 Spinal stenosis, lumbar region: Secondary | ICD-10-CM | POA: Diagnosis not present

## 2014-04-11 DIAGNOSIS — M5126 Other intervertebral disc displacement, lumbar region: Secondary | ICD-10-CM | POA: Diagnosis not present

## 2014-04-11 DIAGNOSIS — M545 Low back pain: Secondary | ICD-10-CM

## 2014-05-11 DIAGNOSIS — M5136 Other intervertebral disc degeneration, lumbar region: Secondary | ICD-10-CM | POA: Diagnosis not present

## 2014-05-11 DIAGNOSIS — M5441 Lumbago with sciatica, right side: Secondary | ICD-10-CM | POA: Diagnosis not present

## 2014-05-29 DIAGNOSIS — M5136 Other intervertebral disc degeneration, lumbar region: Secondary | ICD-10-CM | POA: Diagnosis not present

## 2014-06-03 ENCOUNTER — Encounter: Payer: Self-pay | Admitting: Family Medicine

## 2014-06-03 ENCOUNTER — Ambulatory Visit (INDEPENDENT_AMBULATORY_CARE_PROVIDER_SITE_OTHER): Payer: Commercial Managed Care - HMO | Admitting: Family Medicine

## 2014-06-03 VITALS — BP 168/92 | HR 75 | Temp 98.4°F | Ht 64.0 in | Wt 158.5 lb

## 2014-06-03 DIAGNOSIS — Z1211 Encounter for screening for malignant neoplasm of colon: Secondary | ICD-10-CM | POA: Diagnosis not present

## 2014-06-03 DIAGNOSIS — Z23 Encounter for immunization: Secondary | ICD-10-CM

## 2014-06-03 DIAGNOSIS — R739 Hyperglycemia, unspecified: Secondary | ICD-10-CM | POA: Diagnosis not present

## 2014-06-03 DIAGNOSIS — Z0000 Encounter for general adult medical examination without abnormal findings: Secondary | ICD-10-CM | POA: Diagnosis not present

## 2014-06-03 DIAGNOSIS — M899 Disorder of bone, unspecified: Secondary | ICD-10-CM

## 2014-06-03 DIAGNOSIS — M949 Disorder of cartilage, unspecified: Secondary | ICD-10-CM

## 2014-06-03 DIAGNOSIS — E785 Hyperlipidemia, unspecified: Secondary | ICD-10-CM | POA: Diagnosis not present

## 2014-06-03 MED ORDER — PANTOPRAZOLE SODIUM 40 MG PO TBEC: 40.0000 mg | DELAYED_RELEASE_TABLET | Freq: Two times a day (BID) | ORAL | Status: DC

## 2014-06-03 MED ORDER — FLUOXETINE HCL 20 MG PO CAPS: ORAL_CAPSULE | ORAL | Status: DC

## 2014-06-03 MED ORDER — PRAVASTATIN SODIUM 80 MG PO TABS: ORAL_TABLET | ORAL | Status: DC

## 2014-06-03 NOTE — Patient Instructions (Signed)
Labs today  Start checking blood pressure at home (when relaxed) and follow up in 2-3 weeks with meter and log book of bp readings  We will call about referral for colonoscopy prevnar vaccine today

## 2014-06-03 NOTE — Progress Notes (Signed)
Subjective:    Patient ID: Connie Rowe, female    DOB: 02-Aug-1950, 64 y.o.   MRN: [id number]  HPI Here for annual medicare wellness visit as well as chronic/acute medical problems   I have personally reviewed the Medicare Annual Wellness questionnaire and have noted 1. The patient's medical and social history 2. Their use of alcohol, tobacco or illicit drugs 3. Their current medications and supplements 4. The patient's functional ability including ADL's, fall risks, home safety risks and hearing or visual             impairment. 5. Diet and physical activities 6. Evidence for depression or mood disorders  The patients weight, height, BMI have been recorded in the chart and visual acuity is per eye clinic.  I have made referrals, counseling and provided education to the patient based review of the above and I have provided the pt with a written personalized care plan for preventive services. Reviewed and updated provider list, see scanned forms.  Is doing very well overall except for back problems  Had a shot (epidural inj) on Friday- and it is helping a little bit - will have to have another   See scanned forms.  Routine anticipatory guidance given to patient.  See health maintenance. Colon cancer screening colonoscopy in 2007 - overdue for colonosc with fam hx of colon cancer -needs the referral  Breast cancer screening - 2/15- has a letter to schedule (goes to [facility]) Self breast exam  No lumps or changes No gyn problems/ no abn paps and no new partners or exposures  Flu vaccine 11/15  Tetanus vaccine 9/08 Pneumovax -will get prevnar today  Zoster vaccine had 8/15  Bone density test 11/12 - mild osteopenia - FN -1.3 , (one fall-dog pulled her down )- had to get rid of the dog, has not broken any bones - wants to wait a year on it , she is taking calcium and vit D for bones  Advance directive- does not have, given materials to work on , she is familiar with the process    Cognitive function addressed- see scanned forms- and if abnormal then additional documentation follows.  No concerns about memory     PMH and SH reviewed  Meds, vitals, and allergies reviewed.   ROS: See HPI.  Otherwise negative.    bp is up today-has not usually been that high Has a hx of whitecoat HTN Husband has a monitor   Due for labs  Hyperglycemia  Lab Results  Component Value Date   HGBA1C 6.5 04/16/2013    Hx of hyperlipidemia Lab Results  Component Value Date   CHOL 192 04/15/2013   HDL 61.70 04/15/2013   LDLCALC 96 04/15/2013   LDLDIRECT 122.4 09/14/2010   TRIG 170.0* 04/15/2013   CHOLHDL 3 04/15/2013    Did go to a wt loss clinic for a while - lost some (had "vitamin" shots)- then quit and gained wt back  Does not eat fatty or fried foods  Also exercising at [facility] 3-4 times per week   Declines screen for hep C and HIV   Patient Active Problem List   Diagnosis Date Noted  . Encounter for Medicare annual wellness exam 06/03/2014  . Acute maxillary sinusitis 02/10/2014  . Right hip pain 02/10/2014  . Sciatica 11/26/2013  . Viral URI with cough 07/07/2013  . Hyperglycemia 04/17/2013  . Hirsutism 12/06/2010  . Routine general medical examination at a health care facility 09/10/2010  . Other screening  mammogram 09/01/2010  . Disorder of bone and cartilage 10/22/2006  . URINARY INCONTINENCE, STRESS 08/22/2006  . History of Helicobacter pylori infection 04/18/2006  . Hyperlipidemia 04/18/2006  . DEPRESSION 04/18/2006  . EXTERNAL HEMORRHOIDS 04/18/2006  . GERD 04/18/2006  . HIATAL HERNIA 04/18/2006  . OVERACTIVE BLADDER 04/18/2006  . HEMATURIA, MICROSCOPIC 04/18/2006  . GAMMA GLUTAMYL TRANSFERASE, SERUM, ELEVATED 04/18/2006  . PANCREATITIS, HX OF 04/18/2006  . DIVERTICULOSIS, COLON 01/02/2000   Past Medical History  Diagnosis Date  . Depression   . GERD (gastroesophageal reflux disease)   . Hyperlipidemia   . Pancreatitis     History of  .  Osteopenia   . Esophageal stricture    Past Surgical History  Procedure Laterality Date  . Tubal ligation    . Tonsillectomy    . Bladder repair     History  Substance Use Topics  . Smoking status: Never Smoker   . Smokeless tobacco: Never Used  . Alcohol Use: 0.0 oz/week    0 Standard drinks or equivalent per week     Comment: rare/socially   Family History  Problem Relation Age of Onset  . Stroke Mother   . Hypertension Mother   . Cancer Mother     colon CA  . Drug abuse Son   . Stroke Brother    No Known Allergies Current Outpatient Prescriptions on File Prior to Visit  Medication Sig Dispense Refill  . Acetaminophen (TYLENOL ARTHRITIS PAIN PO) Take 2 tablets by mouth as needed.    Marland Kitchen amoxicillin-clavulanate (AUGMENTIN) 875-125 MG per tablet Take 1 tablet by mouth 2 (two) times daily. 14 tablet 0  . Calcium Carbonate-Vitamin D (CALCIUM-VITAMIN D) 500-200 MG-UNIT per tablet Take 1 tablet by mouth daily.      . diphenhydrAMINE (BENADRYL) 25 MG tablet Take 25 mg by mouth every 6 (six) hours as needed.      Marland Kitchen FLUoxetine (PROZAC) 20 MG capsule TAKE ONE CAPSULE BY MOUTH EVERY DAY 90 capsule 3  . pantoprazole (PROTONIX) 40 MG tablet Take 1 tablet (40 mg total) by mouth 2 (two) times daily. 180 tablet 3  . pravastatin (PRAVACHOL) 80 MG tablet TAKE ONE TABLET BY MOUTH EVERY DAY 90 tablet 3   No current facility-administered medications on file prior to visit.         Review of Systems Review of Systems  Constitutional: Negative for fever, appetite change, fatigue and unexpected weight change.  Eyes: Negative for pain and visual disturbance.  Respiratory: Negative for cough and shortness of breath.   Cardiovascular: Negative for cp or palpitations    Gastrointestinal: Negative for nausea, diarrhea and constipation.  Genitourinary: Negative for urgency and frequency.  Skin: Negative for pallor or rash   Neurological: Negative for weakness, light-headedness, numbness and  headaches.  Hematological: Negative for adenopathy. Does not bruise/bleed easily.  Psychiatric/Behavioral: Negative for dysphoric mood. The patient is not nervous/anxious.         Objective:   Physical Exam  Constitutional: She appears well-developed and well-nourished. No distress.  overwt and well app  HENT:  Head: Normocephalic and atraumatic.  Right Ear: External ear normal.  Left Ear: External ear normal.  Mouth/Throat: Oropharynx is clear and moist.  Eyes: Conjunctivae and EOM are normal. Pupils are equal, round, and reactive to light. No scleral icterus.  Neck: Normal range of motion. Neck supple. No JVD present. Carotid bruit is not present. No thyromegaly present.  Cardiovascular: Normal rate, regular rhythm, normal heart sounds and intact distal pulses.  Exam reveals no gallop.   Pulmonary/Chest: Effort normal and breath sounds normal. No respiratory distress. She has no wheezes. She exhibits no tenderness.  Abdominal: Soft. Bowel sounds are normal. She exhibits no distension, no abdominal bruit and no mass. There is no tenderness.  Genitourinary: No breast swelling, tenderness, discharge or bleeding.  Musculoskeletal: Normal range of motion. She exhibits no edema or tenderness.  No kyphosis   Lymphadenopathy:    She has no cervical adenopathy.  Neurological: She is alert. She has normal reflexes. No cranial nerve deficit. She exhibits normal muscle tone. Coordination normal.  Skin: Skin is warm and dry. No rash noted. No erythema. No pallor.  Psychiatric: She has a normal mood and affect.          Assessment & Plan:   Problem List Items Addressed This Visit      Musculoskeletal and Integument   Disorder of bone and cartilage    dexa 11/12  Declines dexa for another year since she has to get a colonoscopy  Disc need for calcium/ vitamin D/ wt bearing exercise and bone density test every 2 y to monitor Disc safety/ fracture risk in detail   D level today       Relevant Orders   Vit D  25 hydroxy (rtn osteoporosis monitoring)     Other   Colon cancer screening    Refer for screening colonoscopy       Relevant Orders   Ambulatory referral to Gastroenterology   Encounter for Medicare annual wellness exam - Primary    Reviewed health habits including diet and exercise and skin cancer prevention Reviewed appropriate screening tests for age  Also reviewed health mt list, fam hx and immunization status , as well as social and family history   See HPI Labs today  Start checking blood pressure at home (when relaxed) and follow up in 2-3 weeks with meter and log book of bp readings  We will call about referral for colonoscopy prevnar vaccine today       Relevant Orders   CBC with Differential/Platelet   TSH   Hyperglycemia    A1C today Disc imp of diet and exercise to prevent DM No symptoms       Relevant Orders   Hemoglobin A1c   Hyperlipidemia    Lab today On pravastatin  Rev low sat fat diet       Relevant Medications   pravastatin (PRAVACHOL) 80 MG tablet   Other Relevant Orders   TSH   Comprehensive metabolic panel   Lipid panel    Other Visit Diagnoses    Need for prophylactic vaccination against Streptococcus pneumoniae (pneumococcus)        Relevant Orders    Pneumococcal conjugate vaccine 13-valent IM (Completed)

## 2014-06-03 NOTE — Progress Notes (Signed)
Pre visit review using our clinic review tool, if applicable. No additional management support is needed unless otherwise documented below in the visit note. 

## 2014-06-04 LAB — COMPREHENSIVE METABOLIC PANEL
ALT: 18 U/L (ref 0–35)
AST: 15 U/L (ref 0–37)
Albumin: 4.3 g/dL (ref 3.5–5.2)
Alkaline Phosphatase: 81 U/L (ref 39–117)
BILIRUBIN TOTAL: 0.4 mg/dL (ref 0.2–1.2)
BUN: 16 mg/dL (ref 6–23)
CO2: 30 mEq/L (ref 19–32)
Calcium: 9.8 mg/dL (ref 8.4–10.5)
Chloride: 101 mEq/L (ref 96–112)
Creatinine, Ser: 1.07 mg/dL (ref 0.40–1.20)
GFR: 54.56 mL/min — ABNORMAL LOW (ref >60.00)
GLUCOSE: 80 mg/dL (ref 70–99)
Potassium: 4.4 mEq/L (ref 3.5–5.1)
SODIUM: 137 meq/L (ref 135–145)
Total Protein: 7.5 g/dL (ref 6.0–8.3)

## 2014-06-04 LAB — LIPID PANEL
CHOL/HDL RATIO: 3
Cholesterol: 240 mg/dL — ABNORMAL HIGH (ref 0–200)
HDL: 72.3 mg/dL (ref >39.00)
NONHDL: 167.7
Triglycerides: 228 mg/dL — ABNORMAL HIGH (ref 0.0–149.0)
VLDL: 45.6 mg/dL — ABNORMAL HIGH (ref 0.0–40.0)

## 2014-06-04 LAB — CBC WITH DIFFERENTIAL/PLATELET
Basophils Absolute: 0 10*3/uL (ref 0.0–0.1)
Basophils Relative: 0.6 % (ref 0.0–3.0)
EOS PCT: 4.2 % (ref 0.0–5.0)
Eosinophils Absolute: 0.3 10*3/uL (ref 0.0–0.7)
HCT: 41.4 % (ref 36.0–46.0)
Hemoglobin: 13.9 g/dL (ref 12.0–15.0)
LYMPHS PCT: 37.2 % (ref 12.0–46.0)
Lymphs Abs: 2.7 10*3/uL (ref 0.7–4.0)
MCHC: 33.6 g/dL (ref 30.0–36.0)
MCV: 87.4 fl (ref 78.0–100.0)
MONOS PCT: 8.8 % (ref 3.0–12.0)
Monocytes Absolute: 0.6 10*3/uL (ref 0.1–1.0)
NEUTROS PCT: 49.2 % (ref 43.0–77.0)
Neutro Abs: 3.5 10*3/uL (ref 1.4–7.7)
Platelets: 296 10*3/uL (ref 150.0–400.0)
RBC: 4.73 Mil/uL (ref 3.87–5.11)
RDW: 13.7 % (ref 11.5–15.5)
WBC: 7.2 10*3/uL (ref 4.0–10.5)

## 2014-06-04 LAB — LDL CHOLESTEROL, DIRECT: LDL DIRECT: 126 mg/dL

## 2014-06-04 LAB — TSH: TSH: 0.83 u[IU]/mL (ref 0.35–4.50)

## 2014-06-04 LAB — HEMOGLOBIN A1C: HEMOGLOBIN A1C: 5.7 % (ref 4.6–6.5)

## 2014-06-04 LAB — VITAMIN D 25 HYDROXY (VIT D DEFICIENCY, FRACTURES): VITD: 21.68 ng/mL — AB (ref 30.00–100.00)

## 2014-06-04 NOTE — Assessment & Plan Note (Signed)
Lab today On pravastatin  Rev low sat fat diet

## 2014-06-04 NOTE — Assessment & Plan Note (Signed)
Refer for screening colonoscopy 

## 2014-06-04 NOTE — Assessment & Plan Note (Signed)
Reviewed health habits including diet and exercise and skin cancer prevention Reviewed appropriate screening tests for age  Also reviewed health mt list, fam hx and immunization status , as well as social and family history   See HPI Labs today  Start checking blood pressure at home (when relaxed) and follow up in 2-3 weeks with meter and log book of bp readings  We will call about referral for colonoscopy prevnar vaccine today

## 2014-06-04 NOTE — Assessment & Plan Note (Signed)
A1C today Disc imp of diet and exercise to prevent DM No symptoms

## 2014-06-04 NOTE — Assessment & Plan Note (Signed)
dexa 11/12  Declines dexa for another year since she has to get a colonoscopy  Disc need for calcium/ vitamin D/ wt bearing exercise and bone density test every 2 y to monitor Disc safety/ fracture risk in detail   D level today

## 2014-06-05 ENCOUNTER — Encounter: Payer: Self-pay | Admitting: *Deleted

## 2014-06-24 DIAGNOSIS — M5136 Other intervertebral disc degeneration, lumbar region: Secondary | ICD-10-CM | POA: Diagnosis not present

## 2014-06-26 ENCOUNTER — Encounter: Payer: Self-pay | Admitting: Family Medicine

## 2014-06-26 ENCOUNTER — Ambulatory Visit (INDEPENDENT_AMBULATORY_CARE_PROVIDER_SITE_OTHER): Payer: Commercial Managed Care - HMO | Admitting: Family Medicine

## 2014-06-26 VITALS — BP 156/96 | HR 82 | Temp 98.6°F | Ht 64.0 in | Wt 164.5 lb

## 2014-06-26 DIAGNOSIS — I10 Essential (primary) hypertension: Secondary | ICD-10-CM | POA: Diagnosis not present

## 2014-06-26 MED ORDER — LISINOPRIL-HYDROCHLOROTHIAZIDE 10-12.5 MG PO TABS: 1.0000 | ORAL_TABLET | Freq: Every day | ORAL | Status: DC

## 2014-06-26 NOTE — Patient Instructions (Signed)
Start lisinopril hct 10-12.5 one pill daily  Take care of yourself Avoid excess sodium  Get back to [facility] when you can Follow up with me in 4-5 weeks  If any side effects or problems please let me know

## 2014-06-26 NOTE — Assessment & Plan Note (Addendum)
New dx with family hx  Disc process of dz and risks  Start lisinopril hct 10-12.5 -rev poss side eff Lifestyle change /DASH diet/exercise rev F/u 4-5 wk for visit and will check lab also   Rev last labs

## 2014-06-26 NOTE — Progress Notes (Signed)
Pre visit review using our clinic review tool, if applicable. No additional management support is needed unless otherwise documented below in the visit note. 

## 2014-06-26 NOTE — Progress Notes (Signed)
Subjective:    Patient ID: Connie Rowe, female    DOB: May 07, 1950, 64 y.o.   MRN: [id number]  HPI Here for f/u of elevated bp   Had another shot in her back this week and it is really helping her   BP is staying high  BP Readings from Last 3 Encounters:  06/26/14 156/96  06/03/14 168/92  02/10/14 126/84    Range home 140s-160s /70s-90s   Has a lot of headaches- usually the back of her head  No cp or sob   Wt is up 6 lb with bmi of 28   Does not add salt Buys salt free products  [facility]es to [facility] for exercise  3-4 times a week  Family hx - mother had HTN     Chemistry      Component Value Date/Time   NA 137 06/03/2014 1843   K 4.4 06/03/2014 1843   CL 101 06/03/2014 1843   CO2 30 06/03/2014 1843   BUN 16 06/03/2014 1843   CREATININE 1.07 06/03/2014 1843      Component Value Date/Time   CALCIUM 9.8 06/03/2014 1843   ALKPHOS 81 06/03/2014 1843   AST 15 06/03/2014 1843   ALT 18 06/03/2014 1843   BILITOT 0.4 06/03/2014 1843      Lab Results  Component Value Date   CHOL 240* 06/03/2014   HDL 72.30 06/03/2014   LDLCALC 96 04/15/2013   LDLDIRECT 126.0 06/03/2014   TRIG 228.0* 06/03/2014   CHOLHDL 3 06/03/2014   Patient Active Problem List   Diagnosis Date Noted  . Encounter for Medicare annual wellness exam 06/03/2014  . Colon cancer screening 06/03/2014  . Acute maxillary sinusitis 02/10/2014  . Right hip pain 02/10/2014  . Sciatica 11/26/2013  . Viral URI with cough 07/07/2013  . Hyperglycemia 04/17/2013  . Hirsutism 12/06/2010  . Routine general medical examination at a health care facility 09/10/2010  . Other screening mammogram 09/01/2010  . Disorder of bone and cartilage 10/22/2006  . URINARY INCONTINENCE, STRESS 08/22/2006  . History of Helicobacter pylori infection 04/18/2006  . Hyperlipidemia 04/18/2006  . DEPRESSION 04/18/2006  . EXTERNAL HEMORRHOIDS 04/18/2006  . GERD 04/18/2006  . HIATAL HERNIA 04/18/2006  . OVERACTIVE BLADDER  04/18/2006  . HEMATURIA, MICROSCOPIC 04/18/2006  . GAMMA GLUTAMYL TRANSFERASE, SERUM, ELEVATED 04/18/2006  . PANCREATITIS, HX OF 04/18/2006  . DIVERTICULOSIS, COLON 01/02/2000   Past Medical History  Diagnosis Date  . Depression   . GERD (gastroesophageal reflux disease)   . Hyperlipidemia   . Pancreatitis     History of  . Osteopenia   . Esophageal stricture    Past Surgical History  Procedure Laterality Date  . Tubal ligation    . Tonsillectomy    . Bladder repair     History  Substance Use Topics  . Smoking status: Never Smoker   . Smokeless tobacco: Never Used  . Alcohol Use: 0.0 oz/week    0 Standard drinks or equivalent per week     Comment: rare/socially   Family History  Problem Relation Age of Onset  . Stroke Mother   . Hypertension Mother   . Cancer Mother     colon CA  . Drug abuse Son   . Stroke Brother    No Known Allergies Current Outpatient Prescriptions on File Prior to Visit  Medication Sig Dispense Refill  . Acetaminophen (TYLENOL ARTHRITIS PAIN PO) Take 2 tablets by mouth as needed.    . Calcium Carbonate-Vitamin D (CALCIUM-VITAMIN D)  500-200 MG-UNIT per tablet Take 1 tablet by mouth daily.      . diphenhydrAMINE (BENADRYL) 25 MG tablet Take 25 mg by mouth every 6 (six) hours as needed.      Marland Kitchen FLUoxetine (PROZAC) 20 MG capsule TAKE ONE CAPSULE BY MOUTH EVERY DAY 90 capsule 3  . pantoprazole (PROTONIX) 40 MG tablet Take 1 tablet (40 mg total) by mouth 2 (two) times daily. 180 tablet 3  . pravastatin (PRAVACHOL) 80 MG tablet TAKE ONE TABLET BY MOUTH EVERY DAY 90 tablet 3   No current facility-administered medications on file prior to visit.      Review of Systems    Review of Systems  Constitutional: Negative for fever, appetite change, fatigue and unexpected weight change.  Eyes: Negative for pain and visual disturbance.  Respiratory: Negative for cough and shortness of breath.   Cardiovascular: Negative for cp or palpitations      Gastrointestinal: Negative for nausea, diarrhea and constipation.  Genitourinary: Negative for urgency and frequency.  Skin: Negative for pallor or rash   Neurological: Negative for weakness, light-headedness, numbness and headaches.  Hematological: Negative for adenopathy. Does not bruise/bleed easily.  Psychiatric/Behavioral: Negative for dysphoric mood. The patient is not nervous/anxious.      Objective:   Physical Exam  Constitutional: She appears well-developed and well-nourished. No distress.  Overwt and well app  HENT:  Head: Normocephalic and atraumatic.  Mouth/Throat: Oropharynx is clear and moist.  Eyes: Conjunctivae and EOM are normal. Pupils are equal, round, and reactive to light.  Neck: Normal range of motion. Neck supple. No JVD present. Carotid bruit is not present. No thyromegaly present.  Cardiovascular: Normal rate, regular rhythm, normal heart sounds and intact distal pulses.  Exam reveals no gallop.   Pulmonary/Chest: Effort normal and breath sounds normal. No respiratory distress. She has no wheezes. She has no rales.  No crackles  Abdominal: Soft. Bowel sounds are normal. She exhibits no distension, no abdominal bruit and no mass. There is no tenderness.  Musculoskeletal: She exhibits no edema.  Lymphadenopathy:    She has no cervical adenopathy.  Neurological: She is alert. She has normal reflexes.  Skin: Skin is warm and dry. No rash noted.  Psychiatric: She has a normal mood and affect.          Assessment & Plan:   Problem List Items Addressed This Visit    Essential hypertension - Primary    New dx with family hx  Disc process of dz and risks  Start lisinopril hct 10-12.5 -rev poss side eff Lifestyle change /DASH diet/exercise rev F/u 4-5 wk for visit and will check lab also   Rev last labs       Relevant Medications   lisinopril-hydrochlorothiazide (PRINZIDE,ZESTORETIC) 10-12.5 MG per tablet

## 2014-07-07 ENCOUNTER — Encounter: Payer: Self-pay | Admitting: Gastroenterology

## 2014-07-28 ENCOUNTER — Ambulatory Visit (INDEPENDENT_AMBULATORY_CARE_PROVIDER_SITE_OTHER): Payer: Commercial Managed Care - HMO | Admitting: Family Medicine

## 2014-07-28 ENCOUNTER — Encounter: Payer: Self-pay | Admitting: Family Medicine

## 2014-07-28 VITALS — BP 122/76 | HR 82 | Temp 98.0°F | Ht 64.0 in | Wt 166.8 lb

## 2014-07-28 DIAGNOSIS — I10 Essential (primary) hypertension: Secondary | ICD-10-CM

## 2014-07-28 LAB — BASIC METABOLIC PANEL
BUN: 17 mg/dL (ref 6–23)
CALCIUM: 9.4 mg/dL (ref 8.4–10.5)
CO2: 30 mEq/L (ref 19–32)
Chloride: 101 mEq/L (ref 96–112)
Creatinine, Ser: 1.02 mg/dL (ref 0.40–1.20)
GFR: 57.63 mL/min — ABNORMAL LOW (ref >60.00)
GLUCOSE: 107 mg/dL — AB (ref 70–99)
Potassium: 4.3 mEq/L (ref 3.5–5.1)
SODIUM: 138 meq/L (ref 135–145)

## 2014-07-28 MED ORDER — LISINOPRIL-HYDROCHLOROTHIAZIDE 10-12.5 MG PO TABS: 1.0000 | ORAL_TABLET | Freq: Every day | ORAL | Status: DC

## 2014-07-28 NOTE — Progress Notes (Signed)
Subjective:    Patient ID: Connie Rowe, female    DOB: 12-09-1950, 64 y.o.   MRN: [id number]  HPI Here for f/u of HTN  Started lisinopril hct 10-12.5 last visit   bp is stable today  No cp or palpitations or headaches or edema  No side effects to medicines  BP Readings from Last 3 Encounters:  07/28/14 122/76  06/26/14 156/96  06/03/14 168/92     New med is not bothering her at all   Taking care of herself  Is trying to exercise and decrease sodium   Patient Active Problem List   Diagnosis Date Noted  . Essential hypertension 06/26/2014  . Encounter for Medicare annual wellness exam 06/03/2014  . Colon cancer screening 06/03/2014  . Acute maxillary sinusitis 02/10/2014  . Right hip pain 02/10/2014  . Sciatica 11/26/2013  . Viral URI with cough 07/07/2013  . Hyperglycemia 04/17/2013  . Hirsutism 12/06/2010  . Routine general medical examination at a health care facility 09/10/2010  . Other screening mammogram 09/01/2010  . Disorder of bone and cartilage 10/22/2006  . URINARY INCONTINENCE, STRESS 08/22/2006  . History of Helicobacter pylori infection 04/18/2006  . Hyperlipidemia 04/18/2006  . DEPRESSION 04/18/2006  . EXTERNAL HEMORRHOIDS 04/18/2006  . GERD 04/18/2006  . HIATAL HERNIA 04/18/2006  . OVERACTIVE BLADDER 04/18/2006  . HEMATURIA, MICROSCOPIC 04/18/2006  . GAMMA GLUTAMYL TRANSFERASE, SERUM, ELEVATED 04/18/2006  . PANCREATITIS, HX OF 04/18/2006  . DIVERTICULOSIS, COLON 01/02/2000   Past Medical History  Diagnosis Date  . Depression   . GERD (gastroesophageal reflux disease)   . Hyperlipidemia   . Pancreatitis     History of  . Osteopenia   . Esophageal stricture    Past Surgical History  Procedure Laterality Date  . Tubal ligation    . Tonsillectomy    . Bladder repair     History  Substance Use Topics  . Smoking status: Never Smoker   . Smokeless tobacco: Never Used  . Alcohol Use: 0.0 oz/week    0 Standard drinks or equivalent per  week     Comment: rare/socially   Family History  Problem Relation Age of Onset  . Stroke Mother   . Hypertension Mother   . Cancer Mother     colon CA  . Drug abuse Son   . Stroke Brother    No Known Allergies Current Outpatient Prescriptions on File Prior to Visit  Medication Sig Dispense Refill  . Acetaminophen (TYLENOL ARTHRITIS PAIN PO) Take 2 tablets by mouth as needed.    . Calcium Carbonate-Vitamin D (CALCIUM-VITAMIN D) 500-200 MG-UNIT per tablet Take 1 tablet by mouth daily.      . diphenhydrAMINE (BENADRYL) 25 MG tablet Take 25 mg by mouth every 6 (six) hours as needed.      Marland Kitchen FLUoxetine (PROZAC) 20 MG capsule TAKE ONE CAPSULE BY MOUTH EVERY DAY 90 capsule 3  . lisinopril-hydrochlorothiazide (PRINZIDE,ZESTORETIC) 10-12.5 MG per tablet Take 1 tablet by mouth daily. 30 tablet 11  . pantoprazole (PROTONIX) 40 MG tablet Take 1 tablet (40 mg total) by mouth 2 (two) times daily. 180 tablet 3  . pravastatin (PRAVACHOL) 80 MG tablet TAKE ONE TABLET BY MOUTH EVERY DAY 90 tablet 3   No current facility-administered medications on file prior to visit.     Review of Systems    Review of Systems  Constitutional: Negative for fever, appetite change, fatigue and unexpected weight change.  Eyes: Negative for pain and visual disturbance.  Respiratory: Negative  for cough and shortness of breath.   Cardiovascular: Negative for cp or palpitations    Gastrointestinal: Negative for nausea, diarrhea and constipation.  Genitourinary: Negative for urgency and frequency.  Skin: Negative for pallor or rash   Neurological: Negative for weakness, light-headedness, numbness and headaches.  Hematological: Negative for adenopathy. Does not bruise/bleed easily.  Psychiatric/Behavioral: Negative for dysphoric mood. The patient is not nervous/anxious.      Objective:   Physical Exam  Constitutional: She appears well-developed and well-nourished. No distress.  overwt and well appearing   HENT:    Head: Normocephalic and atraumatic.  Mouth/Throat: Oropharynx is clear and moist.  Eyes: Conjunctivae and EOM are normal. Pupils are equal, round, and reactive to light.  Neck: Normal range of motion. Neck supple. No JVD present. Carotid bruit is not present. No thyromegaly present.  Cardiovascular: Normal rate, regular rhythm, normal heart sounds and intact distal pulses.  Exam reveals no gallop.   Pulmonary/Chest: Effort normal and breath sounds normal. No respiratory distress. She has no wheezes. She has no rales.  No crackles  Abdominal: Soft. Bowel sounds are normal. She exhibits no distension, no abdominal bruit and no mass. There is no tenderness.  Musculoskeletal: She exhibits no edema.  Lymphadenopathy:    She has no cervical adenopathy.  Neurological: She is alert. She has normal reflexes.  Skin: Skin is warm and dry. No rash noted.  Psychiatric: She has a normal mood and affect.          Assessment & Plan:   Problem List Items Addressed This Visit    Essential hypertension - Primary    Well controlled with lisinopril hct Lab today for bmet Rev DASH diet/ exercise  Enc wt loss       Relevant Medications   lisinopril-hydrochlorothiazide (PRINZIDE,ZESTORETIC) 10-12.5 MG per tablet   Other Relevant Orders   Basic metabolic panel

## 2014-07-28 NOTE — Progress Notes (Signed)
Pre visit review using our clinic review tool, if applicable. No additional management support is needed unless otherwise documented below in the visit note. 

## 2014-07-28 NOTE — Assessment & Plan Note (Signed)
Well controlled with lisinopril hct Lab today for bmet Rev DASH diet/ exercise  Enc wt loss

## 2014-07-28 NOTE — Patient Instructions (Signed)
Labs today  Your blood pressure medicine is working well  Keep working on [employer] and exercise

## 2014-07-29 ENCOUNTER — Encounter: Payer: Self-pay | Admitting: *Deleted

## 2014-07-29 DIAGNOSIS — M545 Low back pain: Secondary | ICD-10-CM | POA: Diagnosis not present

## 2014-08-13 ENCOUNTER — Ambulatory Visit (INDEPENDENT_AMBULATORY_CARE_PROVIDER_SITE_OTHER): Payer: Commercial Managed Care - HMO | Admitting: Internal Medicine

## 2014-08-13 ENCOUNTER — Encounter: Payer: Self-pay | Admitting: Internal Medicine

## 2014-08-13 VITALS — BP 118/84 | HR 88 | Temp 98.6°F | Wt 169.0 lb

## 2014-08-13 DIAGNOSIS — J069 Acute upper respiratory infection, unspecified: Secondary | ICD-10-CM | POA: Diagnosis not present

## 2014-08-13 DIAGNOSIS — R05 Cough: Secondary | ICD-10-CM

## 2014-08-13 DIAGNOSIS — R0982 Postnasal drip: Secondary | ICD-10-CM

## 2014-08-13 DIAGNOSIS — R059 Cough, unspecified: Secondary | ICD-10-CM

## 2014-08-13 MED ORDER — HYDROCODONE-HOMATROPINE 5-1.5 MG/5ML PO SYRP: 5.0000 mL | ORAL_SOLUTION | Freq: Three times a day (TID) | ORAL | Status: DC | PRN

## 2014-08-13 MED ORDER — METHYLPREDNISOLONE ACETATE 80 MG/ML IJ SUSP
80.0000 mg | Freq: Once | INTRAMUSCULAR | Status: AC
  Administered 2014-08-13: 80 mg via INTRAMUSCULAR

## 2014-08-13 NOTE — Progress Notes (Signed)
HPI  Pt presents to the clinic today with c/o headache, runny nose and cough. She reports this started 2 weeks ago but has gotten worse over the last week. She is blowing clear mucous out of her nose. The cough is dry and nonproductive. She coughs all day and all night but the cough is interfering with her sleep. She denies fever, chills or body aches. She has tried Delsym and Mucinex with minimal relief. She has no history of allergies or breathing problems. She has had contacts with people who have similar symptoms.  Review of Systems      Past Medical History  Diagnosis Date  . Depression   . GERD (gastroesophageal reflux disease)   . Hyperlipidemia   . Pancreatitis     History of  . Osteopenia   . Esophageal stricture     Family History  Problem Relation Age of Onset  . Stroke Mother   . Hypertension Mother   . Cancer Mother     colon CA  . Drug abuse Son   . Stroke Brother     History   Social History  . Marital Status: Married    Spouse Name: N/A  . Number of Children: N/A  . Years of Education: N/A   Occupational History  . Not on file.   Social History Main Topics  . Smoking status: Never Smoker   . Smokeless tobacco: Never Used  . Alcohol Use: 0.0 oz/week    0 Standard drinks or equivalent per week     Comment: rare/socially  . Drug Use: No  . Sexual Activity: Not on file   Other Topics Concern  . Not on file   Social History Narrative    No Known Allergies   Constitutional: Positive headache. Denies fatigue, fever or abrupt weight changes.  HEENT:  Positive runny nose. Denies eye redness, eye pain, pressure behind the eyes, facial pain, nasal congestion, ear pain, ringing in the ears, wax buildup, or sore throat. Respiratory: Positive cough. Denies difficulty breathing or shortness of breath.  Cardiovascular: Denies chest pain, chest tightness, palpitations or swelling in the hands or feet.   No other specific complaints in a complete review of  systems (except as listed in HPI above).  Objective:   BP 118/84 mmHg  Pulse 88  Temp(Src) 98.6 F (37 C) (Oral)  Wt 169 lb (76.658 kg)  SpO2 99% Wt Readings from Last 3 Encounters:  08/13/14 169 lb (76.658 kg)  07/28/14 166 lb 12 oz (75.637 kg)  06/26/14 164 lb 8 oz (74.617 kg)     General: Appears her stated age, well developed, well nourished in NAD. HEENT: Head: normal shape and size, no sinus tenderness noted; Eyes: sclera white, no icterus, conjunctiva pink, PERRLA and EOMs intact; Ears: Tm's gray and intact, normal light reflex; Nose: mucosa  boggy and moist, septum midline; Throat/Mouth: + PND. Teeth present, mucosa pink and moist, no exudate noted, no lesions or ulcerations noted.  Neck: No lymphadenopathy.  Cardiovascular: Normal rate and rhythm. S1,S2 noted.  No murmur, rubs or gallops noted.  Pulmonary/Chest: Normal effort and positive vesicular breath sounds. No respiratory distress. No wheezes, rales or ronchi noted.      Assessment & Plan:   Cough secondary to PND:  Get some rest and drink plenty of water 80 mg Depo IM today Start Zyrtec daily x 7-10 days eRx for Hycodan for cough  RTC as needed or if symptoms persist.

## 2014-08-13 NOTE — Progress Notes (Signed)
Pre visit review using our clinic review tool, if applicable. No additional management support is needed unless otherwise documented below in the visit note. 

## 2014-08-13 NOTE — Patient Instructions (Signed)
Cough, Adult  A cough is a reflex that helps clear your throat and airways. It can help heal the body or may be a reaction to an irritated airway. A cough may only last 2 or 3 weeks (acute) or may last more than 8 weeks (chronic).  CAUSES Acute cough:  Viral or bacterial infections. Chronic cough:  Infections.  Allergies.  Asthma.  Post-nasal drip.  Smoking.  Heartburn or acid reflux.  Some medicines.  Chronic lung problems (COPD).  Cancer. SYMPTOMS   Cough.  Fever.  Chest pain.  Increased breathing rate.  High-pitched whistling sound when breathing (wheezing).  Colored mucus that you cough up (sputum). TREATMENT   A bacterial cough may be treated with antibiotic medicine.  A viral cough must run its course and will not respond to antibiotics.  Your caregiver may recommend other treatments if you have a chronic cough. HOME CARE INSTRUCTIONS   Only take over-the-counter or prescription medicines for pain, discomfort, or fever as directed by your caregiver. Use cough suppressants only as directed by your caregiver.  Use a cold steam vaporizer or humidifier in your bedroom or home to help loosen secretions.  Sleep in a semi-upright position if your cough is worse at night.  Rest as needed.  Stop smoking if you smoke. SEEK IMMEDIATE MEDICAL CARE IF:   You have pus in your sputum.  Your cough starts to worsen.  You cannot control your cough with suppressants and are losing sleep.  You begin coughing up blood.  You have difficulty breathing.  You develop pain which is getting worse or is uncontrolled with medicine.  You have a fever. MAKE SURE YOU:   Understand these instructions.  Will watch your condition.  Will get help right away if you are not doing well or get worse. Document Released: 07/15/2010 Document Revised: 04/10/2011 Document Reviewed: 07/15/2010 Exit[employer] Patient Information 2015 [employer]. This information is not intended  to replace advice given to you by your health care provider. Make sure you discuss any questions you have with your health care provider.  

## 2014-08-20 ENCOUNTER — Encounter: Payer: Self-pay | Admitting: Family Medicine

## 2014-08-20 ENCOUNTER — Ambulatory Visit (INDEPENDENT_AMBULATORY_CARE_PROVIDER_SITE_OTHER): Payer: Commercial Managed Care - HMO | Admitting: Family Medicine

## 2014-08-20 VITALS — BP 142/86 | HR 88 | Temp 98.7°F | Wt 165.5 lb

## 2014-08-20 DIAGNOSIS — R05 Cough: Secondary | ICD-10-CM

## 2014-08-20 DIAGNOSIS — R059 Cough, unspecified: Secondary | ICD-10-CM

## 2014-08-20 MED ORDER — AZITHROMYCIN 250 MG PO TABS: ORAL_TABLET | ORAL | Status: DC

## 2014-08-20 MED ORDER — BENZONATATE 200 MG PO CAPS: 200.0000 mg | ORAL_CAPSULE | Freq: Three times a day (TID) | ORAL | Status: DC | PRN

## 2014-08-20 MED ORDER — HYDROCODONE-HOMATROPINE 5-1.5 MG/5ML PO SYRP: 5.0000 mL | ORAL_SOLUTION | Freq: Three times a day (TID) | ORAL | Status: DC | PRN

## 2014-08-20 NOTE — Progress Notes (Signed)
Pre visit review using our clinic review tool, if applicable. No additional management support is needed unless otherwise documented below in the visit note.  Sick for about 3 weeks now.  Seen 1 week ago, given steroid injection and zyrtec.  Still with cough.  No FCNAVD.  Still with coughing fits that last for a while.  The cough medicine will help a little.  Voice is hoarse.  Taking delsym w/o relief.  Chest is sore from coughing.  No sputum.  No ST. No ear pain.  L ear feels stuffy but not painful.    Meds, vitals, and allergies reviewed.   ROS: See HPI.  Otherwise, noncontributory.  GEN: nad, alert and oriented HEENT: mucous membranes moist, tm w/o erythema, nasal exam w/o erythema, clear discharge noted,  OP with cobblestoning NECK: supple w/o LA CV: rrr.   PULM: ctab, no inc wob, dry cough noted.  EXT: no edema SKIN: no acute rash

## 2014-08-20 NOTE — Patient Instructions (Signed)
Start taking tessalon for the cough and start the antibiotics today.  This should gradually get better.  If not, then let me know.  Take care.  Glad to see you.

## 2014-08-21 DIAGNOSIS — R059 Cough, unspecified: Secondary | ICD-10-CM | POA: Insufficient documentation

## 2014-08-21 DIAGNOSIS — R05 Cough: Secondary | ICD-10-CM | POA: Insufficient documentation

## 2014-08-21 NOTE — Assessment & Plan Note (Signed)
Given duration, would treat for atypical PNA/walking PNA.  D/w pt.  Nontoxic.  Still ctab, okay for outpatient f/u.  Use tessalon for cough in daytime if needed, can use hycodan if needed- sedation caution.  Supportive care o/w.  She agrees.  F/u prn.

## 2014-08-26 ENCOUNTER — Ambulatory Visit (AMBULATORY_SURGERY_CENTER): Payer: Self-pay

## 2014-08-26 VITALS — Ht 65.0 in | Wt 168.4 lb

## 2014-08-26 DIAGNOSIS — Z800 Family history of malignant neoplasm of digestive organs: Secondary | ICD-10-CM

## 2014-08-26 MED ORDER — SUPREP BOWEL PREP KIT 17.5-3.13-1.6 GM/177ML PO SOLN: 1.0000 | Freq: Once | ORAL | Status: DC

## 2014-08-26 NOTE — Progress Notes (Signed)
No allergies to eggs or soy No past problems with anesthesia No diet/weight loss meds No home oxygen  Refused emmi video.  Stated,"the less i know the better off i am plus i've had this before"

## 2014-08-28 ENCOUNTER — Other Ambulatory Visit: Payer: Self-pay | Admitting: Family Medicine

## 2014-08-28 NOTE — Telephone Encounter (Signed)
Electronic refill request. Last Filled:    120 mL 0 Rf on 08/20/2014  Please advise.

## 2014-08-28 NOTE — Telephone Encounter (Signed)
Please ask how she is feeling re: cough and other symptoms

## 2014-08-31 NOTE — Telephone Encounter (Signed)
I am worried that she may have a residual ace cough from lisinopril   She is on lisinopril hct  Please tell her to stop it  Please call in just the hctz 12.5 mg 1 po qd # 30 3 refills   Let me know if no imp in cough in 3-4 days  Then f/u in 2-3 wk to see how bp is

## 2014-08-31 NOTE — Telephone Encounter (Signed)
Pt said she is still coughing bad pt said it's been going on for about 5 week pt said she feels like she is going to get better but then the cough comes right back, and its a dry non productive cough

## 2014-09-01 NOTE — Telephone Encounter (Signed)
Pt left v/m requesting cb about cough.

## 2014-09-01 NOTE — Telephone Encounter (Signed)
Yes- please deny the cough med for now - if she does not improve after stopping the ace we can re consider

## 2014-09-01 NOTE — Telephone Encounter (Signed)
Patient notified as instructed by telephone and verbalized understanding. Follow-up appointment scheduled with Dr. Glori Bickers as instructed. Rx called to [facility]. Medication added to med sheet.

## 2014-09-01 NOTE — Telephone Encounter (Signed)
Please confirm if cough medication is to be denied.

## 2014-09-04 ENCOUNTER — Ambulatory Visit (INDEPENDENT_AMBULATORY_CARE_PROVIDER_SITE_OTHER): Payer: Commercial Managed Care - HMO | Admitting: Family Medicine

## 2014-09-04 ENCOUNTER — Ambulatory Visit (INDEPENDENT_AMBULATORY_CARE_PROVIDER_SITE_OTHER)
Admission: RE | Admit: 2014-09-04 | Discharge: 2014-09-04 | Disposition: A | Discharge status: Home / Self Care | Payer: Commercial Managed Care - HMO | Source: Ambulatory Visit | Attending: Family Medicine | Admitting: Family Medicine

## 2014-09-04 ENCOUNTER — Telehealth: Payer: Self-pay | Admitting: *Deleted

## 2014-09-04 ENCOUNTER — Encounter: Payer: Self-pay | Admitting: Family Medicine

## 2014-09-04 VITALS — BP 130/80 | HR 80 | Temp 98.7°F | Ht 64.0 in | Wt 168.5 lb

## 2014-09-04 DIAGNOSIS — R05 Cough: Secondary | ICD-10-CM

## 2014-09-04 DIAGNOSIS — R059 Cough, unspecified: Secondary | ICD-10-CM

## 2014-09-04 MED ORDER — PREDNISONE 10 MG PO TABS: ORAL_TABLET | ORAL | Status: DC

## 2014-09-04 MED ORDER — HYDROCODONE-HOMATROPINE 5-1.5 MG/5ML PO SYRP: 5.0000 mL | ORAL_SOLUTION | Freq: Three times a day (TID) | ORAL | Status: DC | PRN

## 2014-09-04 MED ORDER — AZITHROMYCIN 250 MG PO TABS: ORAL_TABLET | ORAL | Status: DC

## 2014-09-04 NOTE — Patient Instructions (Signed)
I think you have a cyclic cough Do not start the blood pressure medicine back until you have been cough free for at least a week  Chest xray today  Drink fluids Hycodan for cough with caution  Take prednisone as directed

## 2014-09-04 NOTE — Telephone Encounter (Signed)
-----   Message from Abner Greenspan, MD sent at 09/04/2014  2:10 PM EDT ----- Chest xray shows some atelectasis (that means an area of air loss)- likely from coughing so much - and this can turn into a pneumonia so we need to watch it  I would like to repeat the zpack you took just to be on the safe side (please call that in-it is in her med list from previous)  I think this will improve with improvement in cough but let me know if not getting better Thanks

## 2014-09-04 NOTE — Progress Notes (Signed)
Subjective:    Patient ID: Connie Rowe, female    DOB: 1950-05-23, 64 y.o.   MRN: [id number]  HPI Here for cough   Was tx with uri Steroid shot/ zyrtec Hycodan and tessalon  Then zpack   Called and told to hold her ace -in case of ace cough   Has been off of the ace only 3 days  Just a little improvement  Has to get up all night long  Is sore over the anterior chest   Non productive  Is a barky cough- like it is coming from up high   No wheezing   BP Readings from Last 3 Encounters:  09/04/14 130/80  08/20/14 142/86  08/13/14 118/84    Patient Active Problem List   Diagnosis Date Noted  . Cough 08/21/2014  . Essential hypertension 06/26/2014  . Encounter for Medicare annual wellness exam 06/03/2014  . Colon cancer screening 06/03/2014  . Acute maxillary sinusitis 02/10/2014  . Right hip pain 02/10/2014  . Sciatica 11/26/2013  . Hyperglycemia 04/17/2013  . Hirsutism 12/06/2010  . Routine general medical examination at a health care facility 09/10/2010  . Other screening mammogram 09/01/2010  . Disorder of bone and cartilage 10/22/2006  . URINARY INCONTINENCE, STRESS 08/22/2006  . History of Helicobacter pylori infection 04/18/2006  . Hyperlipidemia 04/18/2006  . DEPRESSION 04/18/2006  . EXTERNAL HEMORRHOIDS 04/18/2006  . GERD 04/18/2006  . HIATAL HERNIA 04/18/2006  . OVERACTIVE BLADDER 04/18/2006  . HEMATURIA, MICROSCOPIC 04/18/2006  . GAMMA GLUTAMYL TRANSFERASE, SERUM, ELEVATED 04/18/2006  . PANCREATITIS, HX OF 04/18/2006  . DIVERTICULOSIS, COLON 01/02/2000   Past Medical History  Diagnosis Date  . Depression   . GERD (gastroesophageal reflux disease)   . Hyperlipidemia   . Pancreatitis     History of  . Osteopenia   . Esophageal stricture   . Hypertension    Past Surgical History  Procedure Laterality Date  . Tubal ligation    . Tonsillectomy    . Bladder repair     History  Substance Use Topics  . Smoking status: Never Smoker   .  Smokeless tobacco: Never Used  . Alcohol Use: 0.0 oz/week    0 Standard drinks or equivalent per week     Comment: rare/socially   Family History  Problem Relation Age of Onset  . Stroke Mother   . Hypertension Mother   . Cancer Mother     colon CA  . Colon cancer Mother   . Drug abuse Son   . Stroke Brother   . Colon polyps Neg Hx   . Crohn's disease Neg Hx   . Pancreatic cancer Neg Hx   . Rectal cancer Neg Hx   . Stomach cancer Neg Hx   . Ulcerative colitis Neg Hx    No Known Allergies Current Outpatient Prescriptions on File Prior to Visit  Medication Sig Dispense Refill  . Acetaminophen (TYLENOL ARTHRITIS PAIN PO) Take 2 tablets by mouth as needed.    . benzonatate (TESSALON) 200 MG capsule Take 1 capsule (200 mg total) by mouth 3 (three) times daily as needed. 30 capsule 1  . Calcium Carbonate-Vitamin D (CALCIUM-VITAMIN D) 500-200 MG-UNIT per tablet Take 1 tablet by mouth daily.      . diphenhydrAMINE (BENADRYL) 25 MG tablet Take 25 mg by mouth every 6 (six) hours as needed.      Marland Kitchen FLUoxetine (PROZAC) 20 MG capsule TAKE ONE CAPSULE BY MOUTH EVERY DAY 90 capsule 3  . hydrochlorothiazide (MICROZIDE)  12.5 MG capsule Take 12.5 mg by mouth daily.    Marland Kitchen HYDROcodone-homatropine (HYCODAN) 5-1.5 MG/5ML syrup Take 5 mLs by mouth every 8 (eight) hours as needed for cough. 120 mL 0  . pantoprazole (PROTONIX) 40 MG tablet Take 1 tablet (40 mg total) by mouth 2 (two) times daily. 180 tablet 3  . pravastatin (PRAVACHOL) 80 MG tablet TAKE ONE TABLET BY MOUTH EVERY DAY 90 tablet 3  . SUPREP BOWEL PREP SOLN Take 1 kit by mouth once. 354 mL 0   No current facility-administered medications on file prior to visit.    Review of Systems Review of Systems  Constitutional: Negative for fever, appetite change, fatigue and unexpected weight change.  Eyes: Negative for pain and visual disturbance.  ENT pos for post nasal drip / neg for sinus pain  Respiratory: Negative for wheeze and shortness of  breath.  pos for chest wall soreness  Cardiovascular: Negative for cp or palpitations    Gastrointestinal: Negative for nausea, diarrhea and constipation.  Genitourinary: Negative for urgency and frequency.  Skin: Negative for pallor or rash   Neurological: Negative for weakness, light-headedness, numbness and headaches.  Hematological: Negative for adenopathy. Does not bruise/bleed easily.  Psychiatric/Behavioral: Negative for dysphoric mood. The patient is not nervous/anxious.         Objective:   Physical Exam  Constitutional: She appears well-developed and well-nourished. No distress.  overwt and well app  HENT:  Head: Normocephalic and atraumatic.  Right Ear: External ear normal.  Left Ear: External ear normal.  Mouth/Throat: Oropharynx is clear and moist.  Nares are boggy No sinus tenderness Clear rhinorrhea and post nasal drip   Eyes: Conjunctivae and EOM are normal. Pupils are equal, round, and reactive to light. Right eye exhibits no discharge. Left eye exhibits no discharge.  Neck: Normal range of motion. Neck supple.  Cardiovascular: Normal rate and normal heart sounds.   Pulmonary/Chest: Effort normal and breath sounds normal. No respiratory distress. She has no wheezes. She has no rales. She exhibits no tenderness.  No rales or rhonchi No wheeze on forced exp Good air exch Hacking loud cough  Lymphadenopathy:    She has no cervical adenopathy.  Neurological: She is alert.  Skin: Skin is warm and dry. No rash noted.  Psychiatric: She has a normal mood and affect.          Assessment & Plan:   Problem List Items Addressed This Visit    Cough - Primary    Cyclic cough after uri over a month slt imp off ace-continue to hold Hycodan refill with caution  Prednisone taper cxr today Re assuring exam  Has f/u in 2 wk      Relevant Orders   DG Chest 2 View (Completed)

## 2014-09-04 NOTE — Assessment & Plan Note (Signed)
Cyclic cough after uri over a month slt imp off ace-continue to hold Hycodan refill with caution  Prednisone taper cxr today Re assuring exam  Has f/u in 2 wk

## 2014-09-04 NOTE — Progress Notes (Signed)
Pre visit review using our clinic review tool, if applicable. No additional management support is needed unless otherwise documented below in the visit note. 

## 2014-09-04 NOTE — Telephone Encounter (Signed)
Pt notified of xray results and Dr. Marliss Coots comments. Rx sent to pharmacy

## 2014-09-07 ENCOUNTER — Other Ambulatory Visit: Payer: Self-pay | Admitting: Neurological Surgery

## 2014-09-07 DIAGNOSIS — M4806 Spinal stenosis, lumbar region: Secondary | ICD-10-CM | POA: Diagnosis not present

## 2014-09-09 ENCOUNTER — Encounter: Payer: Commercial Managed Care - HMO | Admitting: Gastroenterology

## 2014-09-15 ENCOUNTER — Encounter (HOSPITAL_COMMUNITY): Payer: Self-pay

## 2014-09-15 ENCOUNTER — Encounter (HOSPITAL_COMMUNITY)
Admission: RE | Admit: 2014-09-15 | Discharge: 2014-09-15 | Disposition: A | Discharge status: Home / Self Care | Payer: Commercial Managed Care - HMO | Source: Ambulatory Visit | Attending: Neurological Surgery | Admitting: Neurological Surgery

## 2014-09-15 ENCOUNTER — Ambulatory Visit (HOSPITAL_COMMUNITY)
Admission: RE | Admit: 2014-09-15 | Discharge: 2014-09-15 | Disposition: A | Discharge status: Home / Self Care | Payer: Commercial Managed Care - HMO | Source: Ambulatory Visit | Attending: Neurological Surgery | Admitting: Neurological Surgery

## 2014-09-15 DIAGNOSIS — Z01818 Encounter for other preprocedural examination: Secondary | ICD-10-CM | POA: Diagnosis not present

## 2014-09-15 DIAGNOSIS — Z01812 Encounter for preprocedural laboratory examination: Secondary | ICD-10-CM | POA: Insufficient documentation

## 2014-09-15 DIAGNOSIS — M4806 Spinal stenosis, lumbar region: Secondary | ICD-10-CM | POA: Insufficient documentation

## 2014-09-15 DIAGNOSIS — R05 Cough: Secondary | ICD-10-CM | POA: Insufficient documentation

## 2014-09-15 DIAGNOSIS — M48061 Spinal stenosis, lumbar region without neurogenic claudication: Secondary | ICD-10-CM

## 2014-09-15 DIAGNOSIS — Z01810 Encounter for preprocedural cardiovascular examination: Secondary | ICD-10-CM | POA: Insufficient documentation

## 2014-09-15 HISTORY — DX: Personal history of (healed) traumatic fracture: Z87.81

## 2014-09-15 HISTORY — DX: Personal history of other diseases of the digestive system: Z87.19

## 2014-09-15 HISTORY — DX: Unspecified osteoarthritis, unspecified site: M19.90

## 2014-09-15 LAB — BASIC METABOLIC PANEL
ANION GAP: 12 (ref 5–15)
BUN: 13 mg/dL (ref 6–20)
CALCIUM: 9.8 mg/dL (ref 8.9–10.3)
CO2: 28 mmol/L (ref 22–32)
CREATININE: 1.1 mg/dL — AB (ref 0.44–1.00)
Chloride: 100 mmol/L — ABNORMAL LOW (ref 101–111)
GFR calc Af Amer: 59 mL/min — ABNORMAL LOW (ref >60)
GFR calc non Af Amer: 51 mL/min — ABNORMAL LOW (ref >60)
GLUCOSE: 83 mg/dL (ref 65–99)
Potassium: 3.8 mmol/L (ref 3.5–5.1)
Sodium: 140 mmol/L (ref 135–145)

## 2014-09-15 LAB — CBC WITH DIFFERENTIAL/PLATELET
BASOS PCT: 0 % (ref 0–1)
Basophils Absolute: 0 10*3/uL (ref 0.0–0.1)
Eosinophils Absolute: 0.2 10*3/uL (ref 0.0–0.7)
Eosinophils Relative: 2 % (ref 0–5)
HEMATOCRIT: 44.4 % (ref 36.0–46.0)
Hemoglobin: 14.1 g/dL (ref 12.0–15.0)
LYMPHS ABS: 3 10*3/uL (ref 0.7–4.0)
Lymphocytes Relative: 42 % (ref 12–46)
MCH: 29.9 pg (ref 26.0–34.0)
MCHC: 31.8 g/dL (ref 30.0–36.0)
MCV: 94.1 fL (ref 78.0–100.0)
MONO ABS: 0.6 10*3/uL (ref 0.1–1.0)
MONOS PCT: 9 % (ref 3–12)
NEUTROS ABS: 3.4 10*3/uL (ref 1.7–7.7)
Neutrophils Relative %: 47 % (ref 43–77)
Platelets: 254 10*3/uL (ref 150–400)
RBC: 4.72 MIL/uL (ref 3.87–5.11)
RDW: 14.8 % (ref 11.5–15.5)
WBC: 7.1 10*3/uL (ref 4.0–10.5)

## 2014-09-15 LAB — SURGICAL PCR SCREEN
MRSA, PCR: NEGATIVE
Staphylococcus aureus: NEGATIVE

## 2014-09-15 LAB — PROTIME-INR
INR: 0.91 (ref 0.00–1.49)
Prothrombin Time: 12.5 seconds (ref 11.6–15.2)

## 2014-09-15 NOTE — Progress Notes (Signed)
Patient's PCP is Dr. Glori Bickers at [facility]).  Patient has F/U scheduled for URI this Friday at PCP.  Patient denies having ever seen a cardiologist.

## 2014-09-15 NOTE — Pre-Procedure Instructions (Signed)
    GINNI EICHLER  09/15/2014      [facility] [address], [place] - [address] [place] [address] Phone: [phone] Fax: [place], [place] - [address] [place] [address] Phone: [phone] Fax: [phone]    Your procedure is scheduled on Wednesday August 24th 2016.  Report to [facility] Admitting at 1100 A.M.  Call this number if you have problems in the days leading up to your surgery:  [phone]  Call this number if you have problems the morning of surgery:  [phone]   Remember:  Do not eat food or drink liquids after midnight Tuesday August 23rd.  Take these medicines the morning of surgery with A SIP OF WATER Acetaminophen (Tylenol) if needed, Fluoxetine (Prozac), Pantoprazole (Protonix), Pravastatin (Pravachol)  STOP: ALL Vitamins, Supplements, Effient and Herbal Medications, Fish Oils, Aspirins, NSAIDs (Nonsteroidal Anti-inflammatories such as Ibuprofen, Aleve, or Advil), and Goody's/BC Powders 7 days prior to surgery, until after surgery as directed by your physician. This includes Calcium-Vitamin D supplement.   Do not wear jewelry, make-up or nail polish.  Do not wear lotions, powders, or perfumes.  You may wear deodorant.  Do not shave 48 hours prior to surgery.    Do not bring valuables to the hospital.  [facility] is not responsible for any belongings or valuables.  Contacts, dentures or bridgework may not be worn into surgery.  Leave your suitcase in the car.  After surgery it may be brought to your room.  For patients admitted to the hospital, discharge time will be determined by your treatment team.  Patients discharged the day of surgery will not be allowed to drive home.   Special instructions:  Please follow these instructions carefully:  1. Shower with CHG Soap the night before surgery and the morning of  Surgery. 2. If you choose to wash your hair, wash your hair first as usual with your normal shampoo. 3. After you shampoo, rinse your hair and body thoroughly to remove the Shampoo. 4. Use CHG as you would any other liquid soap. You can apply chg directly to the skin and wash gently with scrungie or a clean washcloth. 5. Apply the CHG Soap to your body ONLY FROM THE NECK DOWN. Do not use on open wounds or open sores. Avoid contact with your eyes, ears, mouth and genitals (private parts). Wash genitals (private parts) with your normal soap. 6. Wash thoroughly, paying special attention to the area where your surgery will be performed. 7. Thoroughly rinse your body with warm water from the neck down. 8. DO NOT shower/wash with your normal soap after using and rinsing off the CHG Soap. 9. Pat yourself dry with a clean towel.  10. Wear clean pajamas.  11. Place clean sheets on your bed the night of your first shower and do not sleep with pets.  Day of Surgery  Do not apply any lotions/deodorants the morning of surgery. Please wear clean clothes to the hospital/surgery center.    Please read over the following fact sheets that you were given. Pain Booklet, Coughing and Deep Breathing, MRSA Information and Surgical Site Infection Prevention

## 2014-09-15 NOTE — Progress Notes (Signed)
   09/15/14 0910  OBSTRUCTIVE SLEEP APNEA  Have you ever been diagnosed with sleep apnea through a sleep study? No  Do you snore loudly (loud enough to be heard through closed doors)?  1  Do you often feel tired, fatigued, or sleepy during the daytime? 1  Has anyone observed you stop breathing during your sleep? 0  Do you have, or are you being treated for high blood pressure? 1  BMI more than 35 kg/m2? 0  Age over 66 years old? 1  Neck circumference greater than 40 cm/16 inches? (14.5)  Gender: 0

## 2014-09-18 ENCOUNTER — Ambulatory Visit (INDEPENDENT_AMBULATORY_CARE_PROVIDER_SITE_OTHER): Payer: Commercial Managed Care - HMO | Admitting: Family Medicine

## 2014-09-18 ENCOUNTER — Encounter: Payer: Self-pay | Admitting: Family Medicine

## 2014-09-18 VITALS — BP 136/84 | HR 80 | Temp 98.6°F | Ht 64.0 in | Wt 169.5 lb

## 2014-09-18 DIAGNOSIS — I10 Essential (primary) hypertension: Secondary | ICD-10-CM | POA: Diagnosis not present

## 2014-09-18 NOTE — Patient Instructions (Signed)
No change in medicine Blood pressure is good  Good luck with surgery  If cough returns let me know

## 2014-09-18 NOTE — Assessment & Plan Note (Addendum)
BP is stable off ace and cough is better  Will continue to follow Enc good diet /low sodium/ and exercise habits

## 2014-09-18 NOTE — Progress Notes (Signed)
Pre visit review using our clinic review tool, if applicable. No additional management support is needed unless otherwise documented below in the visit note. 

## 2014-09-18 NOTE — Progress Notes (Signed)
Subjective:    Patient ID: Connie Rowe, female    DOB: 06-Jun-1950, 64 y.o.   MRN: [id number]  HPI Here for f/u of cough   Much better than it was  A little bit now and then - better than it was   Last visit we d/c her ACE  Still on hctz alone 12.5 mg   BP Readings from Last 3 Encounters:  09/18/14 136/84  09/15/14 133/78  09/04/14 130/80    No changes in bp  She has been to the back doctor - and it is always good !   Patient Active Problem List   Diagnosis Date Noted  . Cough 08/21/2014  . Essential hypertension 06/26/2014  . Encounter for Medicare annual wellness exam 06/03/2014  . Colon cancer screening 06/03/2014  . Right hip pain 02/10/2014  . Sciatica 11/26/2013  . Hyperglycemia 04/17/2013  . Hirsutism 12/06/2010  . Routine general medical examination at a health care facility 09/10/2010  . Other screening mammogram 09/01/2010  . Disorder of bone and cartilage 10/22/2006  . URINARY INCONTINENCE, STRESS 08/22/2006  . History of Helicobacter pylori infection 04/18/2006  . Hyperlipidemia 04/18/2006  . DEPRESSION 04/18/2006  . EXTERNAL HEMORRHOIDS 04/18/2006  . GERD 04/18/2006  . HIATAL HERNIA 04/18/2006  . OVERACTIVE BLADDER 04/18/2006  . HEMATURIA, MICROSCOPIC 04/18/2006  . GAMMA GLUTAMYL TRANSFERASE, SERUM, ELEVATED 04/18/2006  . PANCREATITIS, HX OF 04/18/2006  . DIVERTICULOSIS, COLON 01/02/2000   Past Medical History  Diagnosis Date  . Depression   . GERD (gastroesophageal reflux disease)   . Hyperlipidemia   . Pancreatitis     History of  . Osteopenia   . Esophageal stricture   . Hypertension   . History of hiatal hernia   . Arthritis   . H/O fracture of humerus 2006   Past Surgical History  Procedure Laterality Date  . Tubal ligation    . Tonsillectomy    . Bladder repair    . Colonoscopy    . Upper gi endoscopy    . Esophageal dilation    . Diagnostic laparoscopy      tubal ligation  . Dilation and curettage of uterus    . Multiple  tooth extractions     Social History  Substance Use Topics  . Smoking status: Never Smoker   . Smokeless tobacco: Never Used  . Alcohol Use: 0.0 oz/week    0 Standard drinks or equivalent per week     Comment: rare/socially   Family History  Problem Relation Age of Onset  . Stroke Mother   . Hypertension Mother   . Cancer Mother     colon CA  . Colon cancer Mother   . Drug abuse Son   . Stroke Brother   . Colon polyps Neg Hx   . Crohn's disease Neg Hx   . Pancreatic cancer Neg Hx   . Rectal cancer Neg Hx   . Stomach cancer Neg Hx   . Ulcerative colitis Neg Hx    No Known Allergies Current Outpatient Prescriptions on File Prior to Visit  Medication Sig Dispense Refill  . acetaminophen (TYLENOL) 650 MG CR tablet Take 1,300 mg by mouth every 8 (eight) hours as needed for pain.    . Calcium Carbonate-Vitamin D (CALCIUM-VITAMIN D) 500-200 MG-UNIT per tablet Take 1 tablet by mouth daily.      . diphenhydrAMINE (BENADRYL) 25 MG tablet Take 25 mg by mouth at bedtime as needed.     Marland Kitchen FLUoxetine (PROZAC) 20 MG  capsule TAKE ONE CAPSULE BY MOUTH EVERY DAY 90 capsule 3  . hydrochlorothiazide (MICROZIDE) 12.5 MG capsule Take 12.5 mg by mouth daily.    . pantoprazole (PROTONIX) 40 MG tablet Take 1 tablet (40 mg total) by mouth 2 (two) times daily. 180 tablet 3  . pravastatin (PRAVACHOL) 80 MG tablet TAKE ONE TABLET BY MOUTH EVERY DAY 90 tablet 3   No current facility-administered medications on file prior to visit.      Review of Systems Review of Systems  Constitutional: Negative for fever, appetite change, fatigue and unexpected weight change.  Eyes: Negative for pain and visual disturbance.  Respiratory: Negative for wheeze  and shortness of breath.   Cardiovascular: Negative for cp or palpitations    Gastrointestinal: Negative for nausea, diarrhea and constipation.  Genitourinary: Negative for urgency and frequency.  Skin: Negative for pallor or rash   Neurological: Negative  for weakness, light-headedness, numbness and headaches.  Hematological: Negative for adenopathy. Does not bruise/bleed easily.  Psychiatric/Behavioral: Negative for dysphoric mood. The patient is not nervous/anxious.         Objective:   Physical Exam  Constitutional: She appears well-developed and well-nourished. No distress.  overwt and well app  HENT:  Head: Normocephalic and atraumatic.  Mouth/Throat: Oropharynx is clear and moist.  Eyes: Conjunctivae and EOM are normal. Pupils are equal, round, and reactive to light.  Neck: Normal range of motion. Neck supple. No JVD present. Carotid bruit is not present. No thyromegaly present.  Cardiovascular: Normal rate, regular rhythm, normal heart sounds and intact distal pulses.  Exam reveals no gallop.   Pulmonary/Chest: Effort normal and breath sounds normal. No respiratory distress. She has no wheezes. She has no rales.  No crackles  Abdominal: Soft. Bowel sounds are normal. She exhibits no distension, no abdominal bruit and no mass. There is no tenderness.  Musculoskeletal: She exhibits no edema.  Lymphadenopathy:    She has no cervical adenopathy.  Neurological: She is alert. She has normal reflexes.  Skin: Skin is warm and dry. No rash noted.  Psychiatric: She has a normal mood and affect.          Assessment & Plan:   Problem List Items Addressed This Visit    Essential hypertension - Primary    BP is stable off ace and cough is better  Will continue to follow Enc good diet /low sodium/ and exercise habits

## 2014-09-22 MED ORDER — CEFAZOLIN SODIUM-DEXTROSE 2-3 GM-% IV SOLR
2.0000 g | INTRAVENOUS | Status: AC
  Administered 2014-09-23: 2 g via INTRAVENOUS
  Filled 2014-09-22: qty 50

## 2014-09-22 MED ORDER — DEXAMETHASONE SODIUM PHOSPHATE 10 MG/ML IJ SOLN
10.0000 mg | INTRAMUSCULAR | Status: AC
  Administered 2014-09-23: 10 mg via INTRAVENOUS
  Filled 2014-09-22: qty 1

## 2014-09-22 NOTE — Progress Notes (Signed)
Spoke with patient regarding time change in surgery.  Patient verbalized understanding that she is to be here by 730AM tomorrow morning.

## 2014-09-23 ENCOUNTER — Ambulatory Visit (HOSPITAL_COMMUNITY): Payer: Commercial Managed Care - HMO | Admitting: Anesthesiology

## 2014-09-23 ENCOUNTER — Encounter (HOSPITAL_COMMUNITY): Payer: Self-pay | Admitting: *Deleted

## 2014-09-23 ENCOUNTER — Encounter (HOSPITAL_COMMUNITY)
Admission: RE | Disposition: A | Discharge status: Home / Self Care | Payer: Self-pay | Source: Ambulatory Visit | Attending: Neurological Surgery

## 2014-09-23 ENCOUNTER — Observation Stay (HOSPITAL_COMMUNITY)
Admission: RE | Admit: 2014-09-23 | Discharge: 2014-09-23 | Disposition: A | Discharge status: Home / Self Care | Payer: Commercial Managed Care - HMO | Source: Ambulatory Visit | Attending: Neurological Surgery | Admitting: Neurological Surgery

## 2014-09-23 ENCOUNTER — Ambulatory Visit (HOSPITAL_COMMUNITY): Payer: Commercial Managed Care - HMO

## 2014-09-23 DIAGNOSIS — Z79899 Other long term (current) drug therapy: Secondary | ICD-10-CM | POA: Diagnosis not present

## 2014-09-23 DIAGNOSIS — M4316 Spondylolisthesis, lumbar region: Secondary | ICD-10-CM | POA: Diagnosis not present

## 2014-09-23 DIAGNOSIS — I10 Essential (primary) hypertension: Secondary | ICD-10-CM | POA: Insufficient documentation

## 2014-09-23 DIAGNOSIS — M4806 Spinal stenosis, lumbar region: Principal | ICD-10-CM | POA: Insufficient documentation

## 2014-09-23 DIAGNOSIS — M8580 Other specified disorders of bone density and structure, unspecified site: Secondary | ICD-10-CM | POA: Diagnosis not present

## 2014-09-23 DIAGNOSIS — K219 Gastro-esophageal reflux disease without esophagitis: Secondary | ICD-10-CM | POA: Insufficient documentation

## 2014-09-23 DIAGNOSIS — E785 Hyperlipidemia, unspecified: Secondary | ICD-10-CM | POA: Insufficient documentation

## 2014-09-23 DIAGNOSIS — Z419 Encounter for procedure for purposes other than remedying health state, unspecified: Secondary | ICD-10-CM

## 2014-09-23 DIAGNOSIS — M1990 Unspecified osteoarthritis, unspecified site: Secondary | ICD-10-CM | POA: Diagnosis not present

## 2014-09-23 DIAGNOSIS — Z9889 Other specified postprocedural states: Secondary | ICD-10-CM

## 2014-09-23 HISTORY — PX: LUMBAR LAMINECTOMY/DECOMPRESSION MICRODISCECTOMY: SHX5026

## 2014-09-23 SURGERY — LUMBAR LAMINECTOMY/DECOMPRESSION MICRODISCECTOMY 1 LEVEL
Anesthesia: General | Site: Back | Laterality: Right

## 2014-09-23 MED ORDER — PROPOFOL 10 MG/ML IV BOLUS
INTRAVENOUS | Status: AC
  Filled 2014-09-23: qty 20

## 2014-09-23 MED ORDER — 0.9 % SODIUM CHLORIDE (POUR BTL) OPTIME
TOPICAL | Status: DC | PRN
  Administered 2014-09-23: 1000 mL

## 2014-09-23 MED ORDER — PHENOL 1.4 % MT LIQD: 1.0000 | OROMUCOSAL | Status: DC | PRN

## 2014-09-23 MED ORDER — PROMETHAZINE HCL 25 MG/ML IJ SOLN: 6.2500 mg | INTRAMUSCULAR | Status: DC | PRN

## 2014-09-23 MED ORDER — POTASSIUM CHLORIDE IN NACL 20-0.9 MEQ/L-% IV SOLN
INTRAVENOUS | Status: DC
  Filled 2014-09-23 (×2): qty 1000

## 2014-09-23 MED ORDER — MORPHINE SULFATE (PF) 2 MG/ML IV SOLN: 1.0000 mg | INTRAVENOUS | Status: DC | PRN

## 2014-09-23 MED ORDER — FENTANYL CITRATE (PF) 100 MCG/2ML IJ SOLN
INTRAMUSCULAR | Status: AC
  Filled 2014-09-23: qty 2

## 2014-09-23 MED ORDER — PANTOPRAZOLE SODIUM 40 MG PO TBEC
40.0000 mg | DELAYED_RELEASE_TABLET | Freq: Two times a day (BID) | ORAL | Status: DC
  Filled 2014-09-23: qty 1

## 2014-09-23 MED ORDER — CEFAZOLIN SODIUM 1-5 GM-% IV SOLN
1.0000 g | Freq: Three times a day (TID) | INTRAVENOUS | Status: DC
  Administered 2014-09-23: 1 g via INTRAVENOUS
  Filled 2014-09-23 (×2): qty 50

## 2014-09-23 MED ORDER — MIDAZOLAM HCL 2 MG/2ML IJ SOLN
INTRAMUSCULAR | Status: AC
  Filled 2014-09-23: qty 4

## 2014-09-23 MED ORDER — ACETAMINOPHEN 650 MG RE SUPP: 650.0000 mg | RECTAL | Status: DC | PRN

## 2014-09-23 MED ORDER — HYDROCHLOROTHIAZIDE 12.5 MG PO CAPS
12.5000 mg | ORAL_CAPSULE | Freq: Every day | ORAL | Status: DC
  Filled 2014-09-23: qty 1

## 2014-09-23 MED ORDER — FENTANYL CITRATE (PF) 100 MCG/2ML IJ SOLN
INTRAMUSCULAR | Status: DC | PRN
  Administered 2014-09-23: 100 ug via INTRAVENOUS
  Administered 2014-09-23: 50 ug via INTRAVENOUS

## 2014-09-23 MED ORDER — MIDAZOLAM HCL 5 MG/5ML IJ SOLN
INTRAMUSCULAR | Status: DC | PRN
  Administered 2014-09-23: 2 mg via INTRAVENOUS

## 2014-09-23 MED ORDER — SUGAMMADEX SODIUM 200 MG/2ML IV SOLN
INTRAVENOUS | Status: AC
  Filled 2014-09-23: qty 2

## 2014-09-23 MED ORDER — ONDANSETRON HCL 4 MG/2ML IJ SOLN
INTRAMUSCULAR | Status: DC | PRN
  Administered 2014-09-23: 4 mg via INTRAVENOUS

## 2014-09-23 MED ORDER — SODIUM CHLORIDE 0.9 % IR SOLN
Status: DC | PRN
  Administered 2014-09-23: 10:00:00

## 2014-09-23 MED ORDER — LACTATED RINGERS IV SOLN
INTRAVENOUS | Status: DC | PRN
  Administered 2014-09-23 (×2): via INTRAVENOUS

## 2014-09-23 MED ORDER — ACETAMINOPHEN 325 MG PO TABS: 650.0000 mg | ORAL_TABLET | ORAL | Status: DC | PRN

## 2014-09-23 MED ORDER — FENTANYL CITRATE (PF) 250 MCG/5ML IJ SOLN
INTRAMUSCULAR | Status: AC
  Filled 2014-09-23: qty 5

## 2014-09-23 MED ORDER — HYDROCODONE-ACETAMINOPHEN 5-325 MG PO TABS
1.0000 | ORAL_TABLET | ORAL | Status: DC | PRN
  Administered 2014-09-23: 2 via ORAL
  Filled 2014-09-23: qty 2

## 2014-09-23 MED ORDER — PHENYLEPHRINE HCL 10 MG/ML IJ SOLN
INTRAMUSCULAR | Status: DC | PRN
  Administered 2014-09-23: 120 ug via INTRAVENOUS
  Administered 2014-09-23 (×3): 80 ug via INTRAVENOUS
  Administered 2014-09-23: 120 ug via INTRAVENOUS

## 2014-09-23 MED ORDER — PROPOFOL 10 MG/ML IV BOLUS
INTRAVENOUS | Status: DC | PRN
  Administered 2014-09-23: 150 mg via INTRAVENOUS

## 2014-09-23 MED ORDER — BUPIVACAINE HCL (PF) 0.25 % IJ SOLN
INTRAMUSCULAR | Status: DC | PRN
  Administered 2014-09-23: 4 mL

## 2014-09-23 MED ORDER — SODIUM CHLORIDE 0.9 % IJ SOLN: 3.0000 mL | INTRAMUSCULAR | Status: DC | PRN

## 2014-09-23 MED ORDER — METHOCARBAMOL 500 MG PO TABS: 500.0000 mg | ORAL_TABLET | Freq: Four times a day (QID) | ORAL | Status: DC | PRN

## 2014-09-23 MED ORDER — FENTANYL CITRATE (PF) 100 MCG/2ML IJ SOLN
25.0000 ug | INTRAMUSCULAR | Status: DC | PRN
  Administered 2014-09-23: 50 ug via INTRAVENOUS

## 2014-09-23 MED ORDER — SUGAMMADEX SODIUM 200 MG/2ML IV SOLN
INTRAVENOUS | Status: DC | PRN
  Administered 2014-09-23: 200 mg via INTRAVENOUS

## 2014-09-23 MED ORDER — HEMOSTATIC AGENTS (NO CHARGE) OPTIME
TOPICAL | Status: DC | PRN
  Administered 2014-09-23: 1 via TOPICAL

## 2014-09-23 MED ORDER — ROCURONIUM BROMIDE 100 MG/10ML IV SOLN
INTRAVENOUS | Status: DC | PRN
  Administered 2014-09-23: 50 mg via INTRAVENOUS

## 2014-09-23 MED ORDER — METHOCARBAMOL 1000 MG/10ML IJ SOLN
500.0000 mg | Freq: Four times a day (QID) | INTRAVENOUS | Status: DC | PRN
  Filled 2014-09-23: qty 5

## 2014-09-23 MED ORDER — SODIUM CHLORIDE 0.9 % IJ SOLN: 3.0000 mL | Freq: Two times a day (BID) | INTRAMUSCULAR | Status: DC

## 2014-09-23 MED ORDER — THROMBIN 5000 UNITS EX SOLR
CUTANEOUS | Status: DC | PRN
  Administered 2014-09-23 (×2): 5000 [IU] via TOPICAL

## 2014-09-23 MED ORDER — MENTHOL 3 MG MT LOZG: 1.0000 | LOZENGE | OROMUCOSAL | Status: DC | PRN

## 2014-09-23 MED ORDER — SODIUM CHLORIDE 0.9 % IV SOLN: 250.0000 mL | INTRAVENOUS | Status: DC

## 2014-09-23 MED ORDER — EPHEDRINE SULFATE 50 MG/ML IJ SOLN
INTRAMUSCULAR | Status: DC | PRN
  Administered 2014-09-23: 10 mg via INTRAVENOUS
  Administered 2014-09-23 (×4): 5 mg via INTRAVENOUS

## 2014-09-23 MED ORDER — FLUOXETINE HCL 20 MG PO CAPS
20.0000 mg | ORAL_CAPSULE | Freq: Every day | ORAL | Status: DC
  Filled 2014-09-23: qty 1

## 2014-09-23 MED ORDER — LIDOCAINE HCL (CARDIAC) 20 MG/ML IV SOLN
INTRAVENOUS | Status: DC | PRN
  Administered 2014-09-23: 50 mg via INTRATRACHEAL
  Administered 2014-09-23: 80 mg via INTRAVENOUS

## 2014-09-23 MED ORDER — ONDANSETRON HCL 4 MG/2ML IJ SOLN: 4.0000 mg | INTRAMUSCULAR | Status: DC | PRN

## 2014-09-23 SURGICAL SUPPLY — 44 items
BAG DECANTER FOR FLEXI CONT (MISCELLANEOUS) ×3 IMPLANT
BENZOIN TINCTURE PRP APPL 2/3 (GAUZE/BANDAGES/DRESSINGS) ×3 IMPLANT
BUR MATCHSTICK NEURO 3.0 LAGG (BURR) ×3 IMPLANT
CANISTER SUCT 3000ML PPV (MISCELLANEOUS) ×3 IMPLANT
CLOSURE WOUND 1/2 X4 (GAUZE/BANDAGES/DRESSINGS) ×1
DRAPE LAPAROTOMY 100X72X124 (DRAPES) ×3 IMPLANT
DRAPE MICROSCOPE LEICA (MISCELLANEOUS) ×3 IMPLANT
DRAPE POUCH INSTRU U-SHP 10X18 (DRAPES) ×3 IMPLANT
DRAPE SURG 17X23 STRL (DRAPES) ×3 IMPLANT
DRSG OPSITE POSTOP 3X4 (GAUZE/BANDAGES/DRESSINGS) ×3 IMPLANT
DURAPREP 26ML APPLICATOR (WOUND CARE) ×3 IMPLANT
ELECT REM PT RETURN 9FT ADLT (ELECTROSURGICAL) ×3
ELECTRODE REM PT RTRN 9FT ADLT (ELECTROSURGICAL) ×1 IMPLANT
GAUZE SPONGE 4X4 16PLY XRAY LF (GAUZE/BANDAGES/DRESSINGS) IMPLANT
GLOVE BIO SURGEON STRL SZ8 (GLOVE) ×3 IMPLANT
GLOVE BIOGEL PI IND STRL 7.0 (GLOVE) ×1 IMPLANT
GLOVE BIOGEL PI IND STRL 7.5 (GLOVE) ×1 IMPLANT
GLOVE BIOGEL PI INDICATOR 7.0 (GLOVE) ×2
GLOVE BIOGEL PI INDICATOR 7.5 (GLOVE) ×2
GLOVE ECLIPSE 6.5 STRL STRAW (GLOVE) ×3 IMPLANT
GOWN STRL REUS W/ TWL LRG LVL3 (GOWN DISPOSABLE) IMPLANT
GOWN STRL REUS W/ TWL XL LVL3 (GOWN DISPOSABLE) ×1 IMPLANT
GOWN STRL REUS W/TWL 2XL LVL3 (GOWN DISPOSABLE) IMPLANT
GOWN STRL REUS W/TWL LRG LVL3 (GOWN DISPOSABLE)
GOWN STRL REUS W/TWL XL LVL3 (GOWN DISPOSABLE) ×2
HEMOSTAT POWDER KIT SURGIFOAM (HEMOSTASIS) IMPLANT
KIT BASIN OR (CUSTOM PROCEDURE TRAY) ×3 IMPLANT
KIT ROOM TURNOVER OR (KITS) ×3 IMPLANT
NEEDLE HYPO 25X1 1.5 SAFETY (NEEDLE) ×3 IMPLANT
NEEDLE SPNL 20GX3.5 QUINCKE YW (NEEDLE) ×3 IMPLANT
NS IRRIG 1000ML POUR BTL (IV SOLUTION) ×3 IMPLANT
PACK LAMINECTOMY NEURO (CUSTOM PROCEDURE TRAY) ×3 IMPLANT
PAD ARMBOARD 7.5X6 YLW CONV (MISCELLANEOUS) ×9 IMPLANT
RUBBERBAND STERILE (MISCELLANEOUS) ×6 IMPLANT
SPONGE SURGIFOAM ABS GEL SZ50 (HEMOSTASIS) ×3 IMPLANT
STRIP CLOSURE SKIN 1/2X4 (GAUZE/BANDAGES/DRESSINGS) ×2 IMPLANT
SUT VIC AB 0 CT1 18XCR BRD8 (SUTURE) ×1 IMPLANT
SUT VIC AB 0 CT1 8-18 (SUTURE) ×2
SUT VIC AB 2-0 CP2 18 (SUTURE) ×3 IMPLANT
SUT VIC AB 3-0 SH 8-18 (SUTURE) ×3 IMPLANT
SYR 20ML ECCENTRIC (SYRINGE) ×3 IMPLANT
TOWEL OR 17X24 6PK STRL BLUE (TOWEL DISPOSABLE) ×3 IMPLANT
TOWEL OR 17X26 10 PK STRL BLUE (TOWEL DISPOSABLE) ×3 IMPLANT
WATER STERILE IRR 1000ML POUR (IV SOLUTION) ×3 IMPLANT

## 2014-09-23 NOTE — Transfer of Care (Signed)
Immediate Anesthesia Transfer of Care Note  Patient: Connie Rowe  Procedure(s) Performed: Procedure(s): Laminectomy and Foraminotomy - Lumbar three- lumbar four - right  (Right)  Patient Location: PACU  Anesthesia Type:General  Level of Consciousness: awake, alert , oriented and patient cooperative  Airway & Oxygen Therapy: Patient Spontanous Breathing and Patient connected to nasal cannula oxygen  Post-op Assessment: Report given to RN, Post -op Vital signs reviewed and stable, Patient moving all extremities and Patient moving all extremities X 4  Post vital signs: Reviewed and stable  Last Vitals:  Filed Vitals:   09/23/14 0743  BP: 172/83  Pulse: 75  Temp: 36.4 C  Resp: 20    Complications: No apparent anesthesia complications

## 2014-09-23 NOTE — Anesthesia Postprocedure Evaluation (Signed)
  Anesthesia Post-op Note  Patient: Connie Rowe  Procedure(s) Performed: Procedure(s) (LRB): Laminectomy and Foraminotomy - Lumbar three- lumbar four - right  (Right)  Patient Location: PACU  Anesthesia Type: General  Level of Consciousness: awake and alert   Airway and Oxygen Therapy: Patient Spontanous Breathing  Post-op Pain: mild  Post-op Assessment: Post-op Vital signs reviewed, Patient's Cardiovascular Status Stable, Respiratory Function Stable, Patent Airway and No signs of Nausea or vomiting  Last Vitals:  Filed Vitals:   09/23/14 1115  BP: 151/69  Pulse: 86  Temp: 36.4 C  Resp: 22    Post-op Vital Signs: stable   Complications: No apparent anesthesia complications

## 2014-09-23 NOTE — Anesthesia Preprocedure Evaluation (Addendum)
Anesthesia Evaluation  Patient identified by MRN, date of birth, ID band Patient awake    Reviewed: Allergy & Precautions, H&P , NPO status , Patient's Chart, lab work & pertinent test results  History of Anesthesia Complications Negative for: history of anesthetic complications  Airway Mallampati: III  TM Distance: >3 FB Neck ROM: full    Dental no notable dental hx.    Pulmonary neg pulmonary ROS,  breath sounds clear to auscultation  Pulmonary exam normal       Cardiovascular hypertension, Pt. on medications negative cardio ROS Normal cardiovascular examRhythm:regular Rate:Normal     Neuro/Psych Depression  Neuromuscular disease    GI/Hepatic Neg liver ROS, hiatal hernia, GERD-  ,Hx of esophageal stricture   Endo/Other  negative endocrine ROS  Renal/GU negative Renal ROS     Musculoskeletal  (+) Arthritis -,   Abdominal   Peds  Hematology negative hematology ROS (+)   Anesthesia Other Findings   Reproductive/Obstetrics negative OB ROS                            Anesthesia Physical Anesthesia Plan  ASA: II  Anesthesia Plan: General   Post-op Pain Management:    Induction: Intravenous  Airway Management Planned: Oral ETT  Additional Equipment:   Intra-op Plan:   Post-operative Plan: Extubation in OR  Informed Consent: I have reviewed the patients History and Physical, chart, labs and discussed the procedure including the risks, benefits and alternatives for the proposed anesthesia with the patient or authorized representative who has indicated his/her understanding and acceptance.     Plan Discussed with: Anesthesiologist, CRNA and Surgeon  Anesthesia Plan Comments:         Anesthesia Quick Evaluation

## 2014-09-23 NOTE — Addendum Note (Signed)
Addendum  created 09/23/14 1219 by Shirlyn Goltz, CRNA   Modules edited: Anesthesia Flowsheet

## 2014-09-23 NOTE — H&P (Signed)
Subjective: Patient is a 64 y.o. female admitted for R leg pain. Onset of symptoms was several months ago, gradually worsening since that time.  The pain is rated severe, and is located at the across the lower back and radiates to RLE. The pain is described as aching and occurs all day. The symptoms have been progressive. Symptoms are exacerbated by exercise. MRI or CT showed stenosis L3-4.   Past Medical History  Diagnosis Date  . Depression   . GERD (gastroesophageal reflux disease)   . Hyperlipidemia   . Pancreatitis     History of  . Osteopenia   . Esophageal stricture   . Hypertension   . History of hiatal hernia   . Arthritis   . H/O fracture of humerus 2006    Past Surgical History  Procedure Laterality Date  . Tubal ligation    . Tonsillectomy    . Bladder repair    . Colonoscopy    . Upper gi endoscopy    . Esophageal dilation    . Diagnostic laparoscopy      tubal ligation  . Dilation and curettage of uterus    . Multiple tooth extractions      Prior to Admission medications   Medication Sig Start Date End Date Taking? Authorizing Provider  Calcium Carbonate-Vitamin D (CALCIUM-VITAMIN D) 500-200 MG-UNIT per tablet Take 1 tablet by mouth daily.     Yes Historical Provider, MD  diphenhydrAMINE (BENADRYL) 25 MG tablet Take 25 mg by mouth at bedtime as needed.    Yes Historical Provider, MD  FLUoxetine (PROZAC) 20 MG capsule TAKE ONE CAPSULE BY MOUTH EVERY DAY 06/03/14  Yes Abner Greenspan, MD  hydrochlorothiazide (MICROZIDE) 12.5 MG capsule Take 12.5 mg by mouth daily.   Yes Historical Provider, MD  pantoprazole (PROTONIX) 40 MG tablet Take 1 tablet (40 mg total) by mouth 2 (two) times daily. 06/03/14  Yes Abner Greenspan, MD  pravastatin (PRAVACHOL) 80 MG tablet TAKE ONE TABLET BY MOUTH EVERY DAY 06/03/14  Yes Abner Greenspan, MD  acetaminophen (TYLENOL) 650 MG CR tablet Take 1,300 mg by mouth every 8 (eight) hours as needed for pain.    Historical Provider, MD   Allergies   Allergen Reactions  . Ace Inhibitors Cough    Social History  Substance Use Topics  . Smoking status: Never Smoker   . Smokeless tobacco: Never Used  . Alcohol Use: 0.0 oz/week    0 Standard drinks or equivalent per week     Comment: rare/socially    Family History  Problem Relation Age of Onset  . Stroke Mother   . Hypertension Mother   . Cancer Mother     colon CA  . Colon cancer Mother   . Drug abuse Son   . Stroke Brother   . Colon polyps Neg Hx   . Crohn's disease Neg Hx   . Pancreatic cancer Neg Hx   . Rectal cancer Neg Hx   . Stomach cancer Neg Hx   . Ulcerative colitis Neg Hx      Review of Systems  Positive ROS: neg  All other systems have been reviewed and were otherwise negative with the exception of those mentioned in the HPI and as above.  Objective: Vital signs in last 24 hours: Temp:  [97.6 F (36.4 C)] 97.6 F (36.4 C) (08/24 0743) Pulse Rate:  [75] 75 (08/24 0743) Resp:  [20] 20 (08/24 0743) BP: (172)/(83) 172/83 mmHg (08/24 0743) SpO2:  [96 %]  96 % (08/24 0743) Weight:  [169 lb (76.658 kg)] 169 lb (76.658 kg) (08/24 0743)  General Appearance: Alert, cooperative, no distress, appears stated age Head: Normocephalic, without obvious abnormality, atraumatic Eyes: PERRL, conjunctiva/corneas clear, EOM's intact    Neck: Supple, symmetrical, trachea midline Back: Symmetric, no curvature, ROM normal, no CVA tenderness Lungs:  respirations unlabored Heart: Regular rate and rhythm Abdomen: Soft, non-tender Extremities: Extremities normal, atraumatic, no cyanosis or edema Pulses: 2+ and symmetric all extremities Skin: Skin color, texture, turgor normal, no rashes or lesions  NEUROLOGIC:   Mental status: Alert and oriented x4,  no aphasia, good attention span, fund of knowledge, and memory Motor Exam - grossly normal Sensory Exam - grossly normal Reflexes: 1= Coordination - grossly normal Gait - grossly normal Balance - grossly normal Cranial  Nerves: I: smell Not tested  II: visual acuity  OS: nl    OD: nl  II: visual fields Full to confrontation  II: pupils Equal, round, reactive to light  III,VII: ptosis None  III,IV,VI: extraocular muscles  Full ROM  V: mastication Normal  V: facial light touch sensation  Normal  V,VII: corneal reflex  Present  VII: facial muscle function - upper  Normal  VII: facial muscle function - lower Normal  VIII: hearing Not tested  IX: soft palate elevation  Normal  IX,X: gag reflex Present  XI: trapezius strength  5/5  XI: sternocleidomastoid strength 5/5  XI: neck flexion strength  5/5  XII: tongue strength  Normal    Data Review Lab Results  Component Value Date   WBC 7.1 09/15/2014   HGB 14.1 09/15/2014   HCT 44.4 09/15/2014   MCV 94.1 09/15/2014   PLT 254 09/15/2014   Lab Results  Component Value Date   NA 140 09/15/2014   K 3.8 09/15/2014   CL 100* 09/15/2014   CO2 28 09/15/2014   BUN 13 09/15/2014   CREATININE 1.10* 09/15/2014   GLUCOSE 83 09/15/2014   Lab Results  Component Value Date   INR 0.91 09/15/2014    Assessment/Plan: Patient admitted for R l3-4 lam. Patient has failed a reasonable attempt at conservative therapy.  I explained the condition and procedure to the patient and answered any questions.  Patient wishes to proceed with procedure as planned. Understands risks/ benefits and typical outcomes of procedure.   , S 09/23/2014 9:39 AM

## 2014-09-23 NOTE — Progress Notes (Signed)
Pt is doing well. Pt and husband given D/C instructions with Rx's, verbal understanding was provided. Pt's incision is clean and dry with no sign of infection. Pt's IV was removed prior to D/C. Pt D/C'd home via wheelchair @ 1940 per MD order. Pt is stable @ D/C and has no other needs at this time. Holli Humbles, RN

## 2014-09-23 NOTE — Op Note (Signed)
09/23/2014  11:00 AM  PATIENT:  Connie Rowe  64 y.o. female  PRE-OPERATIVE DIAGNOSIS:  Lumbar spinal stenosis L3-4  POST-OPERATIVE DIAGNOSIS:  Same  PROCEDURE:  Right L3-4 hemi-laminectomy, medial facetectomy, and foraminotomy with partial sublaminar decompression for central canal decompression  SURGEON:  Sherley Bounds, MD  ASSISTANTS: Dr. Christella Noa  ANESTHESIA:   General  EBL: 50 ml  Total I/O In: 1000 [I.V.:1000] Out: 50 [Blood:50]  BLOOD ADMINISTERED:none  DRAINS: None   SPECIMEN:  No Specimen  INDICATION FOR PROCEDURE: This patient presented with right lower extremity pain. She had an MRI which showed spinal stenosis at L3-4. She tried medical management and injection therapy without relief. Her pain was debilitating. I recommended a right-sided decompression at L3-4. She also had a minimal spondylolisthesis at L4-5 but we felt her symptoms were coming from the stenosis at L3-4. Patient understood the risks, benefits, and alternatives and potential outcomes and wished to proceed.  PROCEDURE DETAILS: The patient was taken to the operating room and after induction of adequate generalized endotracheal anesthesia, the patient was rolled into the prone position on the Wilson frame and all pressure points were padded. The lumbar region was cleaned and then prepped with DuraPrep and draped in the usual sterile fashion. 5 cc of local anesthesia was injected and then a dorsal midline incision was made and carried down to the lumbo sacral fascia. The fascia was opened and the paraspinous musculature was taken down in a subperiosteal fashion to expose L3-4 on the right. Intraoperative x-ray confirmed my level, and then I used a combination of the high-speed drill and the Kerrison punches to perform a hemilaminectomy, medial facetectomy, and foraminotomy at L3-4 on the right. The underlying yellow ligament was opened and removed in a piecemeal fashion to expose the underlying dura and  exiting nerve root. I undercut the lateral recess and dissected down until I was medial to and distal to the pedicle. The nerve root was well decompressed. We then gently retracted the nerve root medially with a retractor, coagulated the epidural venous vasculature, and inspected the disc space. I found no significant disc herniation. I then palpated with a coronary dilator along the nerve root and into the foramen to assure adequate decompression. I felt no more compression of the nerve root. I irrigated with saline solution containing bacitracin. Achieved hemostasis with bipolar cautery, lined the dura with Gelfoam, and then closed the fascia with 0 Vicryl. I closed the subcutaneous tissues with 2-0 Vicryl and the subcuticular tissues with 3-0 Vicryl. The skin was then closed with benzoin and Steri-Strips. The drapes were removed, a sterile dressing was applied. The patient was awakened from general anesthesia and transferred to the recovery room in stable condition. At the end of the procedure all sponge, needle and instrument counts were correct.   PLAN OF CARE: Admit for overnight observation  PATIENT DISPOSITION:  PACU - hemodynamically stable.   Delay start of Pharmacological VTE agent (>24hrs) due to surgical blood loss or risk of bleeding:  yes

## 2014-09-23 NOTE — Anesthesia Procedure Notes (Signed)
Procedure Name: Intubation Date/Time: 09/23/2014 9:55 AM Performed by: Shirlyn Goltz Pre-anesthesia Checklist: Patient identified, Emergency Drugs available, Suction available and Patient being monitored Patient Re-evaluated:Patient Re-evaluated prior to inductionOxygen Delivery Method: Circle system utilized Preoxygenation: Pre-oxygenation with 100% oxygen Intubation Type: IV induction Ventilation: Mask ventilation without difficulty Laryngoscope Size: Mac and 3 Grade View: Grade I Tube type: Oral Tube size: 7.0 mm Number of attempts: 1 Airway Equipment and Method: Stylet and LTA kit utilized Placement Confirmation: ETT inserted through vocal cords under direct vision,  positive ETCO2 and breath sounds checked- equal and bilateral Secured at: 24 cm Tube secured with: Tape Dental Injury: Teeth and Oropharynx as per pre-operative assessment

## 2014-09-24 ENCOUNTER — Encounter (HOSPITAL_COMMUNITY): Payer: Self-pay | Admitting: Neurological Surgery

## 2014-09-24 NOTE — Discharge Summary (Signed)
Physician Discharge Summary  Patient ID: Connie Rowe MRN: [id number] DOB/AGE: 03/30/1950 64 y.o.  Admit date: 09/23/2014 Discharge date: 09/24/2014  Admission Diagnoses: lumbar stenosis    Discharge Diagnoses: same   Discharged Condition: good  Hospital Course: The patient was admitted on 09/23/2014 and taken to the operating room where the patient underwent lum lam. The patient tolerated the procedure well and was taken to the recovery room and then to the floor in stable condition. The hospital course was routine. There were no complications. The wound remained clean dry and intact. Pt had appropriate back soreness. No complaints of leg pain or new N/T/W. The patient remained afebrile with stable vital signs, and tolerated a regular diet. The patient continued to increase activities, and pain was well controlled with oral pain medications.   Consults: None  Significant Diagnostic Studies:  Results for orders placed or performed during the hospital encounter of 09/15/14  Surgical pcr screen  Result Value Ref Range   MRSA, PCR NEGATIVE NEGATIVE   Staphylococcus aureus NEGATIVE NEGATIVE    Chest 2 View  09/15/2014   CLINICAL DATA:  Preop for lumbar spine surgery, recent cough and cold  EXAM: CHEST  2 VIEW  COMPARISON:  Chest x-ray of 09/04/2014  FINDINGS: No active infiltrate or effusion is seen. Minimally prominent perihilar markings are present. No adenopathy is noted. The heart is within normal limits in size. No bony abnormality is seen.  IMPRESSION: No active lung disease.  Minimally prominent perihilar markings.   Electronically Signed   By: Ivar Drape M.D.   On: 09/15/2014 10:16   Dg Chest 2 View  09/04/2014   CLINICAL DATA:  Nonproductive cough, no wheezing  EXAM: CHEST  2 VIEW  COMPARISON:  None in PACs  FINDINGS: The lungs are adequately inflated. There is subtle increased density in the right infrahilar region which not clearly evident on the lateral image. The heart and  pulmonary vascularity are normal. The mediastinum is normal in width. There is no pleural effusion. The bony thorax exhibits no acute abnormality.  IMPRESSION: Subsegmental atelectasis in the right infrahilar region is suspected. This is an area of overlap with anterior and posterior ribs and costal cartilages however.  Follow-up radiographs following anticipated antibiotic therapy are recommended to reassess this region.   Electronically Signed   By:   [place] M.D.   On: 09/04/2014 11:34   Dg Lumbar Spine 2-3 Views  09/23/2014   CLINICAL DATA:  L3-L4 laminectomy  EXAM: LUMBAR SPINE - 2-3 VIEW  COMPARISON:  04/11/2014  FINDINGS: Two lateral views of the lumbar spine submitted. On the first view there is a posterior localization needle at L3-L4 disc space level. On the second view there is a metallic instrument at F7-P1 level.  IMPRESSION: Posterior localization at L3-L4 level.   Electronically Signed   By: Lahoma Crocker M.D.   On: 09/23/2014 10:44    Antibiotics:  Anti-infectives    Start     Dose/Rate Route Frequency Ordered Stop   09/23/14 1800  ceFAZolin (ANCEF) IVPB 1 g/50 mL premix  Status:  Discontinued     1 g 100 mL/hr over 30 Minutes Intravenous Every 8 hours 09/23/14 1233 09/23/14 2241   09/23/14 0924  bacitracin 50,000 Units in sodium chloride irrigation 0.9 % 500 mL irrigation  Status:  Discontinued       As needed 09/23/14 0924 09/23/14 1101   09/23/14 0900  ceFAZolin (ANCEF) IVPB 2 g/50 mL premix     2 g  100 mL/hr over 30 Minutes Intravenous To [facility] 09/22/14 1205 09/23/14 1001      Discharge Exam: Blood pressure 133/56, pulse 101, temperature 98 F (36.7 C), temperature source Oral, resp. rate 16, weight 169 lb (76.658 kg), SpO2 95 %. Neurologic: Grossly normal Incision CDI  Discharge Medications:     Medication List    ASK your doctor about these medications        acetaminophen 650 MG CR tablet  Commonly known as:  TYLENOL  Take 1,300 mg by mouth every  8 (eight) hours as needed for pain.     calcium-vitamin D 500-200 MG-UNIT per tablet  Take 1 tablet by mouth daily.     diphenhydrAMINE 25 MG tablet  Commonly known as:  BENADRYL  Take 25 mg by mouth at bedtime as needed.     FLUoxetine 20 MG capsule  Commonly known as:  PROZAC  TAKE ONE CAPSULE BY MOUTH EVERY DAY     hydrochlorothiazide 12.5 MG capsule  Commonly known as:  MICROZIDE  Take 12.5 mg by mouth daily.     pantoprazole 40 MG tablet  Commonly known as:  PROTONIX  Take 1 tablet (40 mg total) by mouth 2 (two) times daily.     pravastatin 80 MG tablet  Commonly known as:  PRAVACHOL  TAKE ONE TABLET BY MOUTH EVERY DAY        Disposition: home   Final Dx: lumbar lam       Signed: , S 09/24/2014, 12:36 PM

## 2014-11-16 ENCOUNTER — Encounter: Payer: Commercial Managed Care - HMO | Admitting: Gastroenterology

## 2014-12-23 ENCOUNTER — Encounter: Payer: Self-pay | Admitting: Gastroenterology

## 2014-12-29 ENCOUNTER — Telehealth: Payer: Self-pay

## 2014-12-29 NOTE — Telephone Encounter (Signed)
Left message for patient regarding flu shot vaccination.

## 2014-12-31 ENCOUNTER — Ambulatory Visit (INDEPENDENT_AMBULATORY_CARE_PROVIDER_SITE_OTHER): Payer: Commercial Managed Care - HMO

## 2014-12-31 DIAGNOSIS — Z23 Encounter for immunization: Secondary | ICD-10-CM

## 2015-01-01 ENCOUNTER — Other Ambulatory Visit: Payer: Self-pay | Admitting: Family Medicine

## 2015-01-01 NOTE — Telephone Encounter (Signed)
That is fine - she takes it  Will refill electronically

## 2015-01-01 NOTE — Telephone Encounter (Signed)
Electronic refill request, I don't think you have prescribed this Rx for pt in the past, please advise

## 2015-01-07 ENCOUNTER — Other Ambulatory Visit: Payer: Self-pay | Admitting: Neurological Surgery

## 2015-01-07 DIAGNOSIS — M48061 Spinal stenosis, lumbar region without neurogenic claudication: Secondary | ICD-10-CM

## 2015-01-09 ENCOUNTER — Ambulatory Visit
Admission: RE | Admit: 2015-01-09 | Discharge: 2015-01-09 | Disposition: A | Discharge status: Home / Self Care | Payer: Commercial Managed Care - HMO | Source: Ambulatory Visit | Attending: Neurological Surgery | Admitting: Neurological Surgery

## 2015-01-09 DIAGNOSIS — M48061 Spinal stenosis, lumbar region without neurogenic claudication: Secondary | ICD-10-CM

## 2015-01-09 DIAGNOSIS — M4806 Spinal stenosis, lumbar region: Secondary | ICD-10-CM | POA: Diagnosis not present

## 2015-01-09 MED ORDER — GADOBENATE DIMEGLUMINE 529 MG/ML IV SOLN
14.0000 mL | Freq: Once | INTRAVENOUS | Status: AC | PRN
  Administered 2015-01-09: 14 mL via INTRAVENOUS

## 2015-01-12 DIAGNOSIS — M5416 Radiculopathy, lumbar region: Secondary | ICD-10-CM | POA: Diagnosis not present

## 2015-01-12 DIAGNOSIS — I10 Essential (primary) hypertension: Secondary | ICD-10-CM | POA: Diagnosis not present

## 2015-01-12 DIAGNOSIS — Z6829 Body mass index (BMI) 29.0-29.9, adult: Secondary | ICD-10-CM | POA: Diagnosis not present

## 2015-02-02 ENCOUNTER — Ambulatory Visit (AMBULATORY_SURGERY_CENTER): Payer: Self-pay

## 2015-02-02 VITALS — Ht 65.0 in | Wt 177.2 lb

## 2015-02-02 DIAGNOSIS — Z800 Family history of malignant neoplasm of digestive organs: Secondary | ICD-10-CM

## 2015-02-02 NOTE — Progress Notes (Signed)
No allergies to eggs or soy No home oxygen No diet/weight loss meds No past problems with anesthesia  Has email and internet; refused emmi

## 2015-02-08 ENCOUNTER — Encounter: Payer: Self-pay | Admitting: Family Medicine

## 2015-02-08 ENCOUNTER — Ambulatory Visit (INDEPENDENT_AMBULATORY_CARE_PROVIDER_SITE_OTHER): Payer: Commercial Managed Care - HMO | Admitting: Family Medicine

## 2015-02-08 ENCOUNTER — Ambulatory Visit (INDEPENDENT_AMBULATORY_CARE_PROVIDER_SITE_OTHER)
Admission: RE | Admit: 2015-02-08 | Discharge: 2015-02-08 | Disposition: A | Discharge status: Home / Self Care | Payer: Commercial Managed Care - HMO | Source: Ambulatory Visit | Attending: Family Medicine | Admitting: Family Medicine

## 2015-02-08 ENCOUNTER — Ambulatory Visit: Payer: Commercial Managed Care - HMO | Admitting: Family Medicine

## 2015-02-08 VITALS — BP 130/86 | HR 88 | Temp 97.5°F | Ht 65.0 in | Wt 174.5 lb

## 2015-02-08 DIAGNOSIS — M25551 Pain in right hip: Secondary | ICD-10-CM

## 2015-02-08 DIAGNOSIS — M1611 Unilateral primary osteoarthritis, right hip: Secondary | ICD-10-CM | POA: Diagnosis not present

## 2015-02-08 NOTE — Progress Notes (Signed)
Pre visit review using our clinic review tool, if applicable. No additional management support is needed unless otherwise documented below in the visit note. 

## 2015-02-08 NOTE — Patient Instructions (Signed)

## 2015-02-08 NOTE — Progress Notes (Signed)
Dr. Frederico Hamman T. , MD, [facility] [place], [address] Phone: [phone] Fax: [phone]  02/08/2015  Patient: Connie [facility], MRN: [id number], DOB: 29-Mar-1950, 65 y.o.  Primary Physician:  Loura Pardon, MD   Chief Complaint  Patient presents with  . Hip Pain    Right x years   Subjective:   TAHREEM Rowe is a 65 y.o. very pleasant female patient who presents with the following:  R hip pain: Patient of Dr. Glori Bickers with multiyear history of right-sided hip pain.  She was treated over the last year with what was presumed to be lumbar pain and radicular pain with some pain going down into her right leg with complaints about her hip.  She had 2 epidural steroid injections and had an L3-4 microdiscectomy done in August 2016 by Dr. Ronnald Ramp.  The pain that she has been having in her hip has not been improved by any of these interventions.  She describes deep pain in her groin region and limitation with motion.  She also has pain posteriorly and also somewhat laterally.  Had 2 shots in her back - saw Tye Maryland. Did not help at all.  Had a L 3/4 microdiskectomy.  Now some pain in the knee and in the shin. Aches like a tooth ache.   Went to [employer] and [facility]  Past Medical History, Surgical History, Social History, Family History, Problem List, Medications, and Allergies have been reviewed and updated if relevant.  Patient Active Problem List   Diagnosis Date Noted  . S/P lumbar laminectomy 09/23/2014  . Essential hypertension 06/26/2014  . Encounter for Medicare annual wellness exam 06/03/2014  . Colon cancer screening 06/03/2014  . Right hip pain 02/10/2014  . Sciatica 11/26/2013  . Hyperglycemia 04/17/2013  . Hirsutism 12/06/2010  . Routine general medical examination at a health [facility] facility 09/10/2010  . Other screening mammogram 09/01/2010  . Disorder of bone and cartilage 10/22/2006  . URINARY INCONTINENCE,  STRESS 08/22/2006  . History of Helicobacter pylori infection 04/18/2006  . Hyperlipidemia 04/18/2006  . DEPRESSION 04/18/2006  . EXTERNAL HEMORRHOIDS 04/18/2006  . GERD 04/18/2006  . HIATAL HERNIA 04/18/2006  . OVERACTIVE BLADDER 04/18/2006  . HEMATURIA, MICROSCOPIC 04/18/2006  . GAMMA GLUTAMYL TRANSFERASE, SERUM, ELEVATED 04/18/2006  . PANCREATITIS, HX OF 04/18/2006  . DIVERTICULOSIS, COLON 01/02/2000    Past Medical History  Diagnosis Date  . Depression   . GERD (gastroesophageal reflux disease)   . Hyperlipidemia   . Pancreatitis     History of  . Osteopenia   . Esophageal stricture   . Hypertension   . History of hiatal hernia   . Arthritis   . H/O fracture of humerus 2006    Past Surgical History  Procedure Laterality Date  . Tubal ligation    . Tonsillectomy    . Bladder repair    . Colonoscopy    . Upper gi endoscopy    . Esophageal dilation    . Diagnostic laparoscopy      tubal ligation  . Dilation and curettage of uterus    . Multiple tooth extractions    . Lumbar laminectomy/decompression microdiscectomy Right 09/23/2014    Procedure: Laminectomy and Foraminotomy - Lumbar three- lumbar four - right ;  Surgeon: Eustace Moore, MD;  Location: [facility];  Service: Neurosurgery;  Laterality: Right;    Social History   Social History  . Marital Status: Married  Spouse Name: N/A  . Number of Children: N/A  . Years of Education: N/A   Occupational History  . Not on file.   Social History Main Topics  . Smoking status: Never Smoker   . Smokeless tobacco: Never Used  . Alcohol Use: 0.0 oz/week    0 Standard drinks or equivalent per week     Comment: rare/socially  . Drug Use: No  . Sexual Activity: Not on file   Other Topics Concern  . Not on file   Social History Narrative    Family History  Problem Relation Age of Onset  . Stroke Mother   . Hypertension Mother   . Cancer Mother     colon CA  . Colon cancer Mother   . Drug abuse Son    . Stroke Brother   . Colon polyps Neg Hx   . Crohn's disease Neg Hx   . Pancreatic cancer Neg Hx   . Rectal cancer Neg Hx   . Stomach cancer Neg Hx   . Ulcerative colitis Neg Hx     Allergies  Allergen Reactions  . Ace Inhibitors Cough    Medication list reviewed and updated in full in [facility].  GEN: No fevers, chills. Nontoxic. Primarily MSK c/o today. MSK: Detailed in the HPI GI: tolerating PO intake without difficulty Neuro: No numbness, parasthesias, or tingling associated. Otherwise the pertinent positives of the ROS are noted above.   Objective:   BP 130/86 mmHg  Pulse 88  Temp(Src) 97.5 F (36.4 C) (Oral)  Ht 5\' 5"  (1.651 m)  Wt 174 lb 8 oz (79.153 kg)  BMI 29.04 kg/m2   GEN: WDWN, NAD, Non-toxic, Alert & Oriented x 3 HEENT: Atraumatic, Normocephalic.  Ears and Nose: No external deformity. EXTR: No clubbing/cyanosis/edema NEURO: antalgic gait.  PSYCH: Normally interactive. Conversant. Not depressed or anxious appearing.  Calm demeanor.   HIP EXAM: SIDE: R ROM: Abduction, Flexion, Internal and External range of motion: significantly restricted abduction, approximately 15.  The patient is not able to fully flex her hip to 90.  Rotational movements are markedly limited with only about 10-15 of total motion.  Terminal internal range of motion is markedly painful. Pain with terminal IROM and EROM: yes GTB: mild on the r SLR: NEG Knees: No effusion FABER: NT REVERSE FABER: NT, neg Piriformis: NT at direct palpation Str: flexion: 5/5 abduction: 5/5 adduction: 5/5 Strength testing non-tender  Radiology: Dg Hip Unilat With Pelvis 2-3 Views Right  02/09/2015  CLINICAL DATA:  Right hip pain, probable osteoarthritis EXAM: DG HIP (WITH OR WITHOUT PELVIS) 2-3V RIGHT COMPARISON:  None. FINDINGS: No evidence of acute fracture or malalignment. There is advanced degenerative osteoarthritis with narrowing of the superolateral joint space and bone-on-bone  contact. Productive osteophyte formation is noted diffusely. No lytic or blastic osseous lesion. Of note, degenerative changes are also present at the right hip and are of at least moderate severity. IMPRESSION: 1. Advanced right hip joint osteoarthritis with near bone-on-bone contact in the superolateral aspect of the joint. 2. Moderate left hip joint osteoarthritis. Electronically Signed   By: Jacqulynn Cadet M.D.   On: 02/09/2015 08:01    The radiological images were independently reviewed by myself in the office and results were reviewed with the patient. My independent interpretation of images:  Overall advanced osteoarthritis of the right hip, particularly superiorly with bone-on-bone pathology in multiple views.  No fracture or dislocation.  The left hip appears to have moderate osteoarthritic changes overall. Electronically  Signed  By: Owens Loffler, MD On: 02/09/2015 8:43 AM   Assessment and Plan:   Primary osteoarthritis of right hip  Right hip pain - Plan: DG HIP UNILAT WITH PELVIS 2-3 VIEWS RIGHT, Ambulatory referral to Orthopedic Surgery  The patient appears to have advanced osteoarthritis of the right hip radiographically and clinically  With dramatic loss of motion, pain, and functional impairment.  The patient has bone on bone superior hip pathology radiographically. Minor troch bursitis is secondary.  I'm doubtful that anything short of the total hip arthroplasty will bring this patient significant relief.  We discussed this, and I'm going to consult Dr. Ricki Rodriguez or Dr. Alvan Dame for their expertise.   Follow-up: No Follow-up on file.  Orders Placed This Encounter  Procedures  . DG HIP UNILAT WITH PELVIS 2-3 VIEWS RIGHT  . Ambulatory referral to Orthopedic Surgery    Signed,  Frederico Hamman T. , MD   Patient's Medications  New Prescriptions   No medications on file  Previous Medications   ACETAMINOPHEN (TYLENOL) 650 MG CR TABLET    Take 1,300 mg by mouth every 8 (eight)  hours as needed for pain.   CALCIUM CARBONATE-VITAMIN D (CALCIUM-VITAMIN D) 500-200 MG-UNIT PER TABLET    Take 1 tablet by mouth daily.     DIPHENHYDRAMINE (BENADRYL) 25 MG TABLET    Take 25 mg by mouth at bedtime as needed.    FLUOXETINE (PROZAC) 20 MG CAPSULE    TAKE ONE CAPSULE BY MOUTH EVERY DAY   HYDROCHLOROTHIAZIDE (MICROZIDE) 12.5 MG CAPSULE    TAKE 1 CAPSULE BY MOUTH ONCE DAILY   PANTOPRAZOLE (PROTONIX) 40 MG TABLET    Take 1 tablet (40 mg total) by mouth 2 (two) times daily.   PRAVASTATIN (PRAVACHOL) 80 MG TABLET    TAKE ONE TABLET BY MOUTH EVERY DAY  Modified Medications   No medications on file  Discontinued Medications   No medications on file

## 2015-02-15 ENCOUNTER — Ambulatory Visit (AMBULATORY_SURGERY_CENTER): Payer: Commercial Managed Care - HMO | Admitting: Gastroenterology

## 2015-02-15 ENCOUNTER — Encounter: Payer: Self-pay | Admitting: Gastroenterology

## 2015-02-15 VITALS — BP 112/72 | HR 72 | Temp 99.2°F | Resp 13 | Ht 65.0 in | Wt 177.0 lb

## 2015-02-15 DIAGNOSIS — Z800 Family history of malignant neoplasm of digestive organs: Secondary | ICD-10-CM | POA: Diagnosis not present

## 2015-02-15 DIAGNOSIS — Z1211 Encounter for screening for malignant neoplasm of colon: Secondary | ICD-10-CM | POA: Diagnosis not present

## 2015-02-15 DIAGNOSIS — K6389 Other specified diseases of intestine: Secondary | ICD-10-CM | POA: Diagnosis not present

## 2015-02-15 DIAGNOSIS — K635 Polyp of colon: Secondary | ICD-10-CM

## 2015-02-15 DIAGNOSIS — D125 Benign neoplasm of sigmoid colon: Secondary | ICD-10-CM

## 2015-02-15 DIAGNOSIS — K5730 Diverticulosis of large intestine without perforation or abscess without bleeding: Secondary | ICD-10-CM | POA: Diagnosis not present

## 2015-02-15 DIAGNOSIS — Z86010 Personal history of colonic polyps: Secondary | ICD-10-CM

## 2015-02-15 DIAGNOSIS — I10 Essential (primary) hypertension: Secondary | ICD-10-CM | POA: Diagnosis not present

## 2015-02-15 MED ORDER — SODIUM CHLORIDE 0.9 % IV SOLN: 500.0000 mL | INTRAVENOUS | Status: DC

## 2015-02-15 NOTE — Op Note (Signed)
[facility]  [address] [place], [address]   COLONOSCOPY PROCEDURE REPORT  PATIENT: Connie, Rowe  MR#: [id number] BIRTHDATE: Dec 21, 1950 , 76  yrs. old GENDER: female ENDOSCOPIST: Harl Bowie, MD REFERRED [id number] Vernell Morgans, M.D. PROCEDURE DATE:  02/15/2015 PROCEDURE:   Colonoscopy, screening, Colonoscopy with biopsy, and Colonoscopy with snare polypectomy First Screening Colonoscopy - Avg.  risk and is 50 yrs.  old or older - No.  Prior Negative Screening - Now for repeat screening. 10 or more years since last screening  History of Adenoma - Now for follow-up colonoscopy & has been > or = to 3 yrs.  N/A  Polyps removed today? Yes ASA CLASS:   Class II INDICATIONS:Screening for colonic neoplasia and Colorectal Neoplasm Risk Assessment for this procedure is average risk. MEDICATIONS: Propofol 200 mg IV  DESCRIPTION OF PROCEDURE:   After the risks benefits and alternatives of the procedure were thoroughly explained, informed consent was obtained.  The digital rectal exam revealed no abnormalities of the rectum.   The LB [id number]  endoscope was introduced through the anus and advanced to the cecum, which was identified by both the appendix and ileocecal valve. No adverse events experienced.   The quality of the prep was good.  The instrument was then slowly withdrawn as the colon was fully examined. Estimated blood loss is zero unless otherwise noted in this procedure report.  COLON FINDINGS: Melanosis coli was found in the transverse colon, ascending colon, and at the cecum.  Multiple biopsies were performed using cold forceps.   There was mild diverticulosis noted in the sigmoid colon with associated petechiae and angulation. Small internal hemorrhoids were found. 5 mm sessile polyp in sigmoid colon removed by cold snare. Retroflexed views revealed internal hemorrhoids. The time to cecum = 5.1 Withdrawal time = 8.8 The scope was withdrawn and  the procedure completed. COMPLICATIONS: There were no immediate complications.  ENDOSCOPIC IMPRESSION: 1.   Melanosis coli was found in the transverse colon, ascending colon, and at the cecum; multiple biopsies were performed using cold forceps 2.   There was mild diverticulosis noted in the sigmoid colon 3.   Small internal hemorrhoids 4.small benign-appearing sessile polyp removed from sigmoid colon  RECOMMENDATIONS: If the polyp(s) removed today are proven to be adenomatous (pre-cancerous) polyps, you will need a repeat colonoscopy in 5 years.  Otherwise you should continue to follow colorectal cancer screening guidelines for "routine risk" patients with colonoscopy in 10 years.  You will receive a letter within 1-2 weeks with the results of your biopsy as well as final recommendations.  Please call my office if you have not received a letter after 3 weeks.  eSigned:  Harl Bowie, MD 02/15/2015 10:57 AM     PATIENT NAME:  Connie, [facility]e MR#: [id number]

## 2015-02-15 NOTE — Patient Instructions (Signed)
Colon polyp x 1 removed. Handouts given on Hemorrhoids,diverticulosis and colon polyps. Result letter will come in your mail in 1-2 weeks. Thank you!  YOU HAD AN ENDOSCOPIC PROCEDURE TODAY AT [facility]:   Refer to the procedure report that was given to you for any specific questions about what was found during the examination.  If the procedure report does not answer your questions, please call your gastroenterologist to clarify.  If you requested that your care partner not be given the details of your procedure findings, then the procedure report has been included in a sealed envelope for you to review at your convenience later.  YOU SHOULD EXPECT: Some feelings of bloating in the abdomen. Passage of more gas than usual.  Walking can help get rid of the air that was put into your GI tract during the procedure and reduce the bloating. If you had a lower endoscopy (such as a colonoscopy or flexible sigmoidoscopy) you may notice spotting of blood in your stool or on the toilet paper. If you underwent a bowel prep for your procedure, you may not have a normal bowel movement for a few days.  Please Note:  You might notice some irritation and congestion in your nose or some drainage.  This is from the oxygen used during your procedure.  There is no need for concern and it should clear up in a day or so.  SYMPTOMS TO REPORT IMMEDIATELY:   Following lower endoscopy (colonoscopy or flexible sigmoidoscopy):  Excessive amounts of blood in the stool  Significant tenderness or worsening of abdominal pains  Swelling of the abdomen that is new, acute  Fever of 100F or higher   For urgent or emergent issues, a gastroenterologist can be reached at any hour by calling [phone].   DIET: Your first meal following the procedure should be a small meal and then it is ok to progress to your normal diet. Heavy or fried foods are harder to digest and may make you feel nauseous or bloated.   Likewise, meals heavy in dairy and vegetables can increase bloating.  Drink plenty of fluids but you should avoid alcoholic beverages for 24 hours.  ACTIVITY:  You should plan to take it easy for the rest of today and you should NOT DRIVE or use heavy machinery until tomorrow (because of the sedation medicines used during the test).    FOLLOW UP: Our staff will call the number listed on your records the next business day following your procedure to check on you and address any questions or concerns that you may have regarding the information given to you following your procedure. If we do not reach you, we will leave a message.  However, if you are feeling well and you are not experiencing any problems, there is no need to return our call.  We will assume that you have returned to your regular daily activities without incident.  If any biopsies were taken you will be contacted by phone or by letter within the next 1-3 weeks.  Please call us at [phone] if you have not heard about the biopsies in 3 weeks.    SIGNATURES/CONFIDENTIALITY: You and/or your care partner have signed paperwork which will be entered into your electronic medical record.  These signatures attest to the fact that that the information above on your After Visit Summary has been reviewed and is understood.  Full responsibility of the confidentiality of this discharge information lies with you and/or your care-partner.

## 2015-02-15 NOTE — Progress Notes (Signed)
Called to room to assist during endoscopic procedure.  Patient ID and intended procedure confirmed with present staff. Received instructions for my participation in the procedure from the performing physician.  

## 2015-02-15 NOTE — Progress Notes (Signed)
To recovery, repoort to Doyle Askew, RN, VSS.

## 2015-02-16 ENCOUNTER — Telehealth: Payer: Self-pay

## 2015-02-16 NOTE — Telephone Encounter (Signed)
  Follow up Call-  Call back number 02/15/2015  Post procedure Call Back phone  # [phone]  Permission to leave phone message Yes     Patient questions:  Do you have a fever, pain , or abdominal swelling? No. Pain Score  0 *  Have you tolerated food without any problems? Yes.    Have you been able to return to your normal activities? Yes.    Do you have any questions about your discharge instructions: Diet   No. Medications  No. Follow up visit  No.  Do you have questions or concerns about your Care? No.  Actions: * If pain score is 4 or above: No action needed, pain <4.

## 2015-02-22 DIAGNOSIS — M25551 Pain in right hip: Secondary | ICD-10-CM | POA: Diagnosis not present

## 2015-02-22 DIAGNOSIS — M1611 Unilateral primary osteoarthritis, right hip: Secondary | ICD-10-CM | POA: Diagnosis not present

## 2015-02-24 ENCOUNTER — Ambulatory Visit (INDEPENDENT_AMBULATORY_CARE_PROVIDER_SITE_OTHER): Payer: Commercial Managed Care - HMO | Admitting: Family Medicine

## 2015-02-24 ENCOUNTER — Encounter: Payer: Self-pay | Admitting: Gastroenterology

## 2015-02-24 VITALS — BP 126/72 | HR 97 | Temp 98.2°F | Ht 65.0 in | Wt 171.0 lb

## 2015-02-24 DIAGNOSIS — Z01810 Encounter for preprocedural cardiovascular examination: Secondary | ICD-10-CM | POA: Diagnosis not present

## 2015-02-24 DIAGNOSIS — I10 Essential (primary) hypertension: Secondary | ICD-10-CM | POA: Diagnosis not present

## 2015-02-24 DIAGNOSIS — Z01818 Encounter for other preprocedural examination: Secondary | ICD-10-CM | POA: Diagnosis not present

## 2015-02-24 NOTE — Progress Notes (Signed)
Subjective:    Patient ID: [facility], female    DOB: [birth year], 65 y.o.   MRN: [id number]  HPI Here for upcoming medical clearance for orthopedic surgery   Has R hip OA end stage Dr Alvan Dame  Feb 28th  Hip replacement planned   Will send her home -no rehab facility Will have home PT for a while     Hx of osteopenia- takes her ca and D  No blood thinners or asa  Takes tylenol occas  No nsaids  Hx of DDD with Lumbar laminectomy in the past -no trouble with anesthesia or the surgery   Planning on local/epidural anesthesia    Gets cough from ACE No med allergies  No hx of anaphylaxis from anything   bp is stable today  No cp or palpitations or headaches or edema  No side effects to medicines  BP Readings from Last 3 Encounters:  02/24/15 126/72  02/15/15 112/72  02/08/15 130/86       Chemistry      Component Value Date/Time   NA 140 09/15/2014 1346   K 3.8 09/15/2014 1346   CL 100* 09/15/2014 1346   CO2 28 09/15/2014 1346   BUN 13 09/15/2014 1346   CREATININE 1.10* 09/15/2014 1346      Component Value Date/Time   CALCIUM 9.8 09/15/2014 1346   ALKPHOS 81 06/03/2014 1843   AST 15 06/03/2014 1843   ALT 18 06/03/2014 1843   BILITOT 0.4 06/03/2014 1843      Lab Results  Component Value Date   WBC 7.1 09/15/2014   HGB 14.1 09/15/2014   HCT 44.4 09/15/2014   MCV 94.1 09/15/2014   PLT 254 09/15/2014    No lung or heart dz known  EKG NSR with rate of 84 and no acute changes   Patient Active Problem List   Diagnosis Date Noted  . S/P lumbar laminectomy 09/23/2014  . Essential hypertension 06/26/2014  . Encounter for Medicare annual wellness exam 06/03/2014  . Colon cancer screening 06/03/2014  . Right hip pain 02/10/2014  . Sciatica 11/26/2013  . Hyperglycemia 04/17/2013  . Hirsutism 12/06/2010  . Routine general medical examination at a health care facility 09/10/2010  . Other screening mammogram 09/01/2010  . Disorder of bone and cartilage  10/22/2006  . URINARY INCONTINENCE, STRESS 08/22/2006  . History of Helicobacter pylori infection 04/18/2006  . Hyperlipidemia 04/18/2006  . DEPRESSION 04/18/2006  . EXTERNAL HEMORRHOIDS 04/18/2006  . GERD 04/18/2006  . HIATAL HERNIA 04/18/2006  . OVERACTIVE BLADDER 04/18/2006  . HEMATURIA, MICROSCOPIC 04/18/2006  . GAMMA GLUTAMYL TRANSFERASE, SERUM, ELEVATED 04/18/2006  . PANCREATITIS, HX OF 04/18/2006  . DIVERTICULOSIS, COLON 01/02/2000   Past Medical History  Diagnosis Date  . Depression   . GERD (gastroesophageal reflux disease)   . Hyperlipidemia   . Pancreatitis     History of  . Osteopenia   . Esophageal stricture   . Hypertension   . History of hiatal hernia   . Arthritis   . H/O fracture of humerus 2006   Past Surgical History  Procedure Laterality Date  . Tubal ligation    . Tonsillectomy    . Bladder repair    . Colonoscopy    . Upper gi endoscopy    . Esophageal dilation    . Diagnostic laparoscopy      tubal ligation  . Dilation and curettage of uterus    . Multiple tooth extractions    . Lumbar laminectomy/decompression microdiscectomy Right 09/23/2014  Procedure: Laminectomy and Foraminotomy - Lumbar three- lumbar four - right ;  Surgeon: Eustace Moore, MD;  Location: [facility];  Service: Neurosurgery;  Laterality: Right;   Social History  Substance Use Topics  . Smoking status: Never Smoker   . Smokeless tobacco: Never Used  . Alcohol Use: 0.0 oz/week    0 Standard drinks or equivalent per week     Comment: rare/socially   Family History  Problem Relation Age of Onset  . Stroke Mother   . Hypertension Mother   . Cancer Mother     colon CA  . Colon cancer Mother   . Drug abuse Son   . Stroke Brother   . Colon polyps Neg Hx   . Crohn's disease Neg Hx   . Pancreatic cancer Neg Hx   . Rectal cancer Neg Hx   . Stomach cancer Neg Hx   . Ulcerative colitis Neg Hx    Allergies  Allergen Reactions  . Ace Inhibitors Cough   Current  Outpatient Prescriptions on File Prior to Visit  Medication Sig Dispense Refill  . acetaminophen (TYLENOL) 650 MG CR tablet Take 1,300 mg by mouth every 8 (eight) hours as needed for pain.    . Calcium Carbonate-Vitamin D (CALCIUM-VITAMIN D) 500-200 MG-UNIT per tablet Take 1 tablet by mouth daily.      . diphenhydrAMINE (BENADRYL) 25 MG tablet Take 25 mg by mouth at bedtime as needed.     . diphenhydrAMINE (SOMINEX) 25 MG tablet Take 25 mg by mouth at bedtime as needed for sleep.    Marland Kitchen FLUoxetine (PROZAC) 20 MG capsule TAKE ONE CAPSULE BY MOUTH EVERY DAY 90 capsule 3  . hydrochlorothiazide (MICROZIDE) 12.5 MG capsule TAKE 1 CAPSULE BY MOUTH ONCE DAILY 30 capsule 11  . pantoprazole (PROTONIX) 40 MG tablet Take 1 tablet (40 mg total) by mouth 2 (two) times daily. 180 tablet 3  . pravastatin (PRAVACHOL) 80 MG tablet TAKE ONE TABLET BY MOUTH EVERY DAY 90 tablet 3   No current facility-administered medications on file prior to visit.      Review of Systems Review of Systems  Constitutional: Negative for fever, appetite change, fatigue and unexpected weight change.  Eyes: Negative for pain and visual disturbance.  Respiratory: Negative for cough and shortness of breath.   Cardiovascular: Negative for cp or palpitations    Gastrointestinal: Negative for nausea, diarrhea and constipation.  Genitourinary: Negative for urgency and frequency.  Skin: Negative for pallor or rash   MSK pos for hip pain  Neurological: Negative for weakness, light-headedness, numbness and headaches.  Hematological: Negative for adenopathy. Does not bruise/bleed easily.  Psychiatric/Behavioral: Negative for dysphoric mood. The patient is not nervous/anxious.         Objective:   Physical Exam  Constitutional: She appears well-developed and well-nourished. No distress.  overwt and well appearing   HENT:  Head: Normocephalic and atraumatic.  Mouth/Throat: Oropharynx is clear and moist.  Eyes: Conjunctivae and EOM  are normal. Pupils are equal, round, and reactive to light.  Neck: Normal range of motion. Neck supple. No JVD present. Carotid bruit is not present. No thyromegaly present.  Cardiovascular: Normal rate, regular rhythm, normal heart sounds and intact distal pulses.  Exam reveals no gallop.   Pulmonary/Chest: Effort normal and breath sounds normal. No respiratory distress. She has no wheezes. She has no rales.  No crackles  Abdominal: Soft. Bowel sounds are normal. She exhibits no distension, no abdominal bruit and no mass. There is  no tenderness.  Musculoskeletal: She exhibits no edema.  Lymphadenopathy:    She has no cervical adenopathy.  Neurological: She is alert. She has normal reflexes.  Skin: Skin is warm and dry. No rash noted.  Psychiatric: She has a normal mood and affect.          Assessment & Plan:   Problem List Items Addressed This Visit      Cardiovascular and Mediastinum   Essential hypertension    bp in fair control at this time  BP Readings from Last 1 Encounters:  02/24/15 126/72   No changes needed Disc lifstyle change with low sodium diet and exercise   [facility] for upcoming surgery        Other   Pre-operative examination    No restrictions for upcoming hip replacement  bp is well controlled Nl EKG , no cardiac or pulmonary history  No nsaids or blood thinners currently  Is medically clear        Other Visit Diagnoses    Pre-operative cardiovascular examination    -  Primary    Relevant Orders    EKG 12-Lead (Completed)

## 2015-02-24 NOTE — Patient Instructions (Signed)
No restrictions for upcoming surgery We will send note to your surgeon  Good luck with everything  Blood pressure is well controlled

## 2015-02-24 NOTE — Progress Notes (Signed)
Pre visit review using our clinic review tool, if applicable. No additional management support is needed unless otherwise documented below in the visit note. 

## 2015-02-25 NOTE — Assessment & Plan Note (Signed)
No restrictions for upcoming hip replacement  bp is well controlled Nl EKG , no cardiac or pulmonary history  No nsaids or blood thinners currently  Is medically clear

## 2015-02-25 NOTE — Assessment & Plan Note (Signed)
bp in fair control at this time  BP Readings from Last 1 Encounters:  02/24/15 126/72   No changes needed Disc lifstyle change with low sodium diet and exercise   [facility] for upcoming surgery

## 2015-03-18 NOTE — H&P (Signed)
TOTAL HIP ADMISSION H&P  Patient is admitted for right total hip arthroplasty, anterior approach.  Subjective:  Chief Complaint:    Right hip primary OA / pain  HPI: Connie Rowe, 65 y.o. female, has a history of pain and functional disability in the right hip(s) due to arthritis and patient has failed non-surgical conservative treatments for greater than 12 weeks to include NSAID's and/or analgesics and activity modification.  Onset of symptoms was gradual starting 4+ years ago with gradually worsening course since that time.The patient noted no past surgery on the right hip(s).  Patient currently rates pain in the right hip at 10 out of 10 with activity. Patient has night pain, worsening of pain with activity and weight bearing, trendelenberg gait, pain that interfers with activities of daily living and pain with passive range of motion. Patient has evidence of periarticular osteophytes and joint space narrowing by imaging studies. This condition presents safety issues increasing the risk of falls.   There is no current active infection.  Risks, benefits and expectations were discussed with the patient.  Risks including but not limited to the risk of anesthesia, blood clots, nerve damage, blood vessel damage, failure of the prosthesis, infection and up to and including death.  Patient understand the risks, benefits and expectations and wishes to proceed with surgery.   PCP: Loura Pardon, MD  D/C Plans:      Home with HHPT  Post-op Meds:       No Rx given   Tranexamic Acid:      To be given - IV    Decadron:      Is to be given  FYI:     ASA  Norco    Patient Active Problem List   Diagnosis Date Noted  . Pre-operative examination 02/24/2015  . S/P lumbar laminectomy 09/23/2014  . Essential hypertension 06/26/2014  . Encounter for Medicare annual wellness exam 06/03/2014  . Colon cancer screening 06/03/2014  . Right hip pain 02/10/2014  . Sciatica 11/26/2013  . Hyperglycemia  04/17/2013  . Hirsutism 12/06/2010  . Routine general medical examination at a health care facility 09/10/2010  . Other screening mammogram 09/01/2010  . Disorder of bone and cartilage 10/22/2006  . URINARY INCONTINENCE, STRESS 08/22/2006  . History of Helicobacter pylori infection 04/18/2006  . Hyperlipidemia 04/18/2006  . DEPRESSION 04/18/2006  . EXTERNAL HEMORRHOIDS 04/18/2006  . GERD 04/18/2006  . HIATAL HERNIA 04/18/2006  . OVERACTIVE BLADDER 04/18/2006  . HEMATURIA, MICROSCOPIC 04/18/2006  . GAMMA GLUTAMYL TRANSFERASE, SERUM, ELEVATED 04/18/2006  . PANCREATITIS, HX OF 04/18/2006  . DIVERTICULOSIS, COLON 01/02/2000   Past Medical History  Diagnosis Date  . Depression   . GERD (gastroesophageal reflux disease)   . Hyperlipidemia   . Pancreatitis     History of  . Osteopenia   . Esophageal stricture   . Hypertension   . History of hiatal hernia   . Arthritis   . H/O fracture of humerus 2006    Past Surgical History  Procedure Laterality Date  . Tubal ligation    . Tonsillectomy    . Bladder repair    . Colonoscopy    . Upper gi endoscopy    . Esophageal dilation    . Diagnostic laparoscopy      tubal ligation  . Dilation and curettage of uterus    . Multiple tooth extractions    . Lumbar laminectomy/decompression microdiscectomy Right 09/23/2014    Procedure: Laminectomy and Foraminotomy - Lumbar three- lumbar four -  right ;  Surgeon: Eustace Moore, MD;  Location: [facility];  Service: Neurosurgery;  Laterality: Right;    No prescriptions prior to admission   Allergies  Allergen Reactions  . Ace Inhibitors Cough    Social History  Substance Use Topics  . Smoking status: Never Smoker   . Smokeless tobacco: Never Used  . Alcohol Use: 0.0 oz/week    0 Standard drinks or equivalent per week     Comment: rare/socially    Family History  Problem Relation Age of Onset  . Stroke Mother   . Hypertension Mother   . Cancer Mother     colon CA  . Colon  cancer Mother   . Drug abuse Son   . Stroke Brother   . Colon polyps Neg Hx   . Crohn's disease Neg Hx   . Pancreatic cancer Neg Hx   . Rectal cancer Neg Hx   . Stomach cancer Neg Hx   . Ulcerative colitis Neg Hx      Review of Systems  Constitutional: Negative.   HENT: Negative.   Eyes: Negative.   Respiratory: Negative.   Cardiovascular: Negative.   Gastrointestinal: Positive for heartburn.  Genitourinary: Negative.   Musculoskeletal: Positive for joint pain.  Skin: Negative.   Neurological: Negative.   Endo/Heme/Allergies: Negative.   Psychiatric/Behavioral: Positive for depression.    Objective:  Physical Exam  Constitutional: She is oriented to person, place, and time. She appears well-developed.  HENT:  Head: Normocephalic.  Eyes: Pupils are equal, round, and reactive to light.  Neck: Neck supple. No JVD present. No tracheal deviation present. No thyromegaly present.  Cardiovascular: Normal rate, regular rhythm, normal heart sounds and intact distal pulses.   Respiratory: Effort normal and breath sounds normal. No stridor. No respiratory distress. She has no wheezes.  GI: Soft. There is no tenderness. There is no guarding.  Musculoskeletal:       Right hip: She exhibits decreased range of motion, decreased strength, tenderness and bony tenderness. She exhibits no swelling, no deformity and no laceration.  Lymphadenopathy:    She has no cervical adenopathy.  Neurological: She is alert and oriented to person, place, and time.  Skin: Skin is warm and dry.  Psychiatric: She has a normal mood and affect.      Labs:  Estimated body mass index is 29.45 kg/(m^2) as calculated from the following:   Height as of 02/15/15: 5\' 5"  (1.651 m).   Weight as of 02/15/15: 80.287 kg (177 lb).   Imaging Review Plain radiographs demonstrate severe degenerative joint disease of the right hip(s). The bone quality appears to be good for age and reported activity  level.  Assessment/Plan:  End stage arthritis, right hip(s)  The patient history, physical examination, clinical judgement of the provider and imaging studies are consistent with end stage degenerative joint disease of the right hip(s) and total hip arthroplasty is deemed medically necessary. The treatment options including medical management, injection therapy, arthroscopy and arthroplasty were discussed at length. The risks and benefits of total hip arthroplasty were presented and reviewed. The risks due to aseptic loosening, infection, stiffness, dislocation/subluxation,  thromboembolic complications and other imponderables were discussed.  The patient acknowledged the explanation, agreed to proceed with the plan and consent was signed. Patient is being admitted for inpatient treatment for surgery, pain control, PT, OT, prophylactic antibiotics, VTE prophylaxis, progressive ambulation and ADL's and discharge planning.The patient is planning to be discharged home with home health services.  West Pugh    PA-C  03/18/2015, 9:56 AM

## 2015-03-22 ENCOUNTER — Encounter (HOSPITAL_COMMUNITY): Payer: Self-pay

## 2015-03-22 ENCOUNTER — Encounter (HOSPITAL_COMMUNITY)
Admission: RE | Admit: 2015-03-22 | Discharge: 2015-03-22 | Disposition: A | Discharge status: Home / Self Care | Payer: Commercial Managed Care - HMO | Source: Ambulatory Visit | Attending: Orthopedic Surgery | Admitting: Orthopedic Surgery

## 2015-03-22 DIAGNOSIS — Z01812 Encounter for preprocedural laboratory examination: Secondary | ICD-10-CM | POA: Diagnosis not present

## 2015-03-22 DIAGNOSIS — M1611 Unilateral primary osteoarthritis, right hip: Secondary | ICD-10-CM | POA: Insufficient documentation

## 2015-03-22 DIAGNOSIS — Z0183 Encounter for blood typing: Secondary | ICD-10-CM | POA: Diagnosis not present

## 2015-03-22 HISTORY — DX: Polyp of colon: K63.5

## 2015-03-22 HISTORY — DX: Personal history of other diseases of the respiratory system: Z87.09

## 2015-03-22 HISTORY — DX: Nocturia: R35.1

## 2015-03-22 HISTORY — DX: Unspecified fall, initial encounter: W19.XXXA

## 2015-03-22 HISTORY — DX: Repeated falls: R29.6

## 2015-03-22 LAB — CBC
HCT: 42.2 % (ref 36.0–46.0)
Hemoglobin: 13 g/dL (ref 12.0–15.0)
MCH: 28.8 pg (ref 26.0–34.0)
MCHC: 30.8 g/dL (ref 30.0–36.0)
MCV: 93.4 fL (ref 78.0–100.0)
PLATELETS: 250 10*3/uL (ref 150–400)
RBC: 4.52 MIL/uL (ref 3.87–5.11)
RDW: 14.2 % (ref 11.5–15.5)
WBC: 4.6 10*3/uL (ref 4.0–10.5)

## 2015-03-22 LAB — BASIC METABOLIC PANEL
ANION GAP: 8 (ref 5–15)
BUN: 13 mg/dL (ref 6–20)
CALCIUM: 9.4 mg/dL (ref 8.9–10.3)
CHLORIDE: 100 mmol/L — AB (ref 101–111)
CO2: 28 mmol/L (ref 22–32)
CREATININE: 1.03 mg/dL — AB (ref 0.44–1.00)
GFR, EST NON AFRICAN AMERICAN: 55 mL/min — AB (ref >60)
GLUCOSE: 123 mg/dL — AB (ref 65–99)
Potassium: 3.9 mmol/L (ref 3.5–5.1)
Sodium: 136 mmol/L (ref 135–145)

## 2015-03-22 LAB — URINALYSIS, ROUTINE W REFLEX MICROSCOPIC
Bilirubin Urine: NEGATIVE
GLUCOSE, UA: NEGATIVE mg/dL
Hgb urine dipstick: NEGATIVE
Ketones, ur: NEGATIVE mg/dL
LEUKOCYTES UA: NEGATIVE
NITRITE: NEGATIVE
PH: 5.5 (ref 5.0–8.0)
Protein, ur: NEGATIVE mg/dL
Specific Gravity, Urine: 1.008 (ref 1.005–1.030)

## 2015-03-22 LAB — SURGICAL PCR SCREEN
MRSA, PCR: NEGATIVE
STAPHYLOCOCCUS AUREUS: NEGATIVE

## 2015-03-22 LAB — PROTIME-INR
INR: 0.95 (ref 0.00–1.49)
PROTHROMBIN TIME: 12.8 s (ref 11.6–15.2)

## 2015-03-22 LAB — APTT: aPTT: 28 seconds (ref 24–37)

## 2015-03-22 NOTE — Progress Notes (Signed)
CXR in epic 09/15/2014 EKG in epic 02/24/2015  Clearance per epic per Dr Glori Bickers 02/24/2015

## 2015-03-22 NOTE — Patient Instructions (Signed)
Connie Rowe  03/22/2015   Your procedure is scheduled on: Tuesday March 30, 2015   Report to [facility]  Entrance take [place]  elevators to 3rd floor to  [place] at 7:00 AM.  Call this number if you have problems the morning of surgery [phone]   Remember: ONLY 1 PERSON MAY GO WITH YOU TO SHORT STAY TO GET  READY MORNING OF [place].  Do not eat food or drink liquids :After Midnight.     Take these medicines the morning of surgery with A SIP OF WATER: Pantoprazole (Protonix)                               You may not have any metal on your body including hair pins and              piercings  Do not wear jewelry, make-up, lotions, powders or perfumes, deodorant             Do not wear nail polish.  Do not shave  48 hours prior to surgery.             Do not bring valuables to the hospital. [place].  Contacts, dentures or bridgework may not be worn into surgery.  Leave suitcase in the car. After surgery it may be brought to your room.                Please read over the following fact sheets you were given:MRSA INFORMATION SHEET; INCENTIVE SPIROMETER; BLOOD TRANSFUSION INFORMATION SHEET  _____________________________________________________________________             [facility] - Preparing for Surgery Before surgery, you can play an important role.  Because skin is not sterile, your skin needs to be as free of germs as possible.  You can reduce the number of germs on your skin by washing with CHG (chlorahexidine gluconate) soap before surgery.  CHG is an antiseptic cleaner which kills germs and bonds with the skin to continue killing germs even after washing. Please DO NOT use if you have an allergy to CHG or antibacterial soaps.  If your skin becomes reddened/irritated stop using the CHG and inform your nurse when you arrive at Short Stay. Do not shave (including legs and  underarms) for at least 48 hours prior to the first CHG shower.  You may shave your face/neck. Please follow these instructions carefully:  1.  Shower with CHG Soap the night before surgery and the  morning of Surgery.  2.  If you choose to wash your hair, wash your hair first as usual with your  normal  shampoo.  3.  After you shampoo, rinse your hair and body thoroughly to remove the  shampoo.                           4.  Use CHG as you would any other liquid soap.  You can apply chg directly  to the skin and wash                       Gently with a scrungie or clean washcloth.  5.  Apply the  CHG Soap to your body ONLY FROM THE NECK DOWN.   Do not use on face/ open                           Wound or open sores. Avoid contact with eyes, ears mouth and genitals (private parts).                       Wash face,  Genitals (private parts) with your normal soap.             6.  Wash thoroughly, paying special attention to the area where your surgery  will be performed.  7.  Thoroughly rinse your body with warm water from the neck down.  8.  DO NOT shower/wash with your normal soap after using and rinsing off  the CHG Soap.                9.  Pat yourself dry with a clean towel.            10.  Wear clean pajamas.            11.  Place clean sheets on your bed the night of your first shower and do not  sleep with pets. Day of Surgery : Do not apply any lotions/deodorants the morning of surgery.  Please wear clean clothes to the hospital/surgery center.  FAILURE TO FOLLOW THESE INSTRUCTIONS MAY RESULT IN THE CANCELLATION OF YOUR SURGERY PATIENT SIGNATURE_________________________________  NURSE SIGNATURE__________________________________  ________________________________________________________________________   Connie Rowe  An incentive spirometer is a tool that can help keep your lungs clear and active. This tool measures how well you are filling your lungs with each breath. Taking  long deep breaths may help reverse or decrease the chance of developing breathing (pulmonary) problems (especially infection) following:  A long period of time when you are unable to move or be active. BEFORE THE PROCEDURE   If the spirometer includes an indicator to show your best effort, your nurse or respiratory therapist will set it to a desired goal.  If possible, sit up straight or lean slightly forward. Try not to slouch.  Hold the incentive spirometer in an upright position. INSTRUCTIONS FOR USE   Sit on the edge of your bed if possible, or sit up as far as you can in bed or on a chair.  Hold the incentive spirometer in an upright position.  Breathe out normally.  Place the mouthpiece in your mouth and seal your lips tightly around it.  Breathe in slowly and as deeply as possible, raising the piston or the ball toward the top of the column.  Hold your breath for 3-5 seconds or for as long as possible. Allow the piston or ball to fall to the bottom of the column.  Remove the mouthpiece from your mouth and breathe out normally.  Rest for a few seconds and repeat Steps 1 through 7 at least 10 times every 1-2 hours when you are awake. Take your time and take a few normal breaths between deep breaths.  The spirometer may include an indicator to show your best effort. Use the indicator as a goal to work toward during each repetition.  After each set of 10 deep breaths, practice coughing to be sure your lungs are clear. If you have an incision (the cut made at the time of surgery), support your incision when coughing by placing a pillow or rolled up  towels firmly against it. Once you are able to get out of bed, walk around indoors and cough well. You may stop using the incentive spirometer when instructed by your caregiver.  RISKS AND COMPLICATIONS  Take your time so you do not get dizzy or light-headed.  If you are in pain, you may need to take or ask for pain medication before  doing incentive spirometry. It is harder to take a deep breath if you are having pain. AFTER USE  Rest and breathe slowly and easily.  It can be helpful to keep track of a log of your progress. Your caregiver can provide you with a simple table to help with this. If you are using the spirometer at home, follow these instructions: [place] IF:   You are having difficultly using the spirometer.  You have trouble using the spirometer as often as instructed.  Your pain medication is not giving enough relief while using the spirometer.  You develop fever of 100.5 F (38.1 C) or higher. SEEK IMMEDIATE MEDICAL CARE IF:   You cough up bloody sputum that had not been present before.  You develop fever of 102 F (38.9 C) or greater.  You develop worsening pain at or near the incision site. MAKE SURE YOU:   Understand these instructions.  Will watch your condition.  Will get help right away if you are not doing well or get worse. Document Released: 05/29/2006 Document Revised: 04/10/2011 Document Reviewed: 07/30/2006 Exit[employer] Patient Information 2014 Exit[employer], [place].   ________________________________________________________________________  WHAT IS A BLOOD TRANSFUSION? Blood Transfusion Information  A transfusion is the replacement of blood or some of its parts. Blood is made up of multiple cells which provide different functions.  Red blood cells carry oxygen and are used for blood loss replacement.  White blood cells fight against infection.  Platelets control bleeding.  Plasma helps clot blood.  Other blood products are available for specialized needs, such as hemophilia or other clotting disorders. BEFORE THE TRANSFUSION  Who gives blood for transfusions?   Healthy volunteers who are fully evaluated to make sure their blood is safe. This is blood bank blood. Transfusion therapy is the safest it has ever been in the practice of medicine. Before blood is taken  from a donor, a complete history is taken to make sure that person has no history of diseases nor engages in risky social behavior (examples are intravenous drug use or sexual activity with multiple partners). The donor's travel history is screened to minimize risk of transmitting infections, such as malaria. The donated blood is tested for signs of infectious diseases, such as HIV and hepatitis. The blood is then tested to be sure it is compatible with you in order to minimize the chance of a transfusion reaction. If you or a relative donates blood, this is often done in anticipation of surgery and is not appropriate for emergency situations. It takes many days to process the donated blood. RISKS AND COMPLICATIONS Although transfusion therapy is very safe and saves many lives, the main dangers of transfusion include:   Getting an infectious disease.  Developing a transfusion reaction. This is an allergic reaction to something in the blood you were given. Every precaution is taken to prevent this. The decision to have a blood transfusion has been considered carefully by your caregiver before blood is given. Blood is not given unless the benefits outweigh the risks. AFTER THE TRANSFUSION  Right after receiving a blood transfusion, you will usually feel much  better and more energetic. This is especially true if your red blood cells have gotten low (anemic). The transfusion raises the level of the red blood cells which carry oxygen, and this usually causes an energy increase.  The nurse administering the transfusion will monitor you carefully for complications. HOME CARE INSTRUCTIONS  No special instructions are needed after a transfusion. You may find your energy is better. Speak with your caregiver about any limitations on activity for underlying diseases you may have. SEEK MEDICAL CARE IF:   Your condition is not improving after your transfusion.  You develop redness or irritation at the  intravenous (IV) site. SEEK IMMEDIATE MEDICAL CARE IF:  Any of the following symptoms occur over the next 12 hours:  Shaking chills.  You have a temperature by mouth above 102 F (38.9 C), not controlled by medicine.  Chest, back, or muscle pain.  People around you feel you are not acting correctly or are confused.  Shortness of breath or difficulty breathing.  Dizziness and fainting.  You get a rash or develop hives.  You have a decrease in urine output.  Your urine turns a dark color or changes to pink, red, or brown. Any of the following symptoms occur over the next 10 days:  You have a temperature by mouth above 102 F (38.9 C), not controlled by medicine.  Shortness of breath.  Weakness after normal activity.  The white part of the eye turns yellow (jaundice).  You have a decrease in the amount of urine or are urinating less often.  Your urine turns a dark color or changes to pink, red, or brown. Document Released: 01/14/2000 Document Revised: 04/10/2011 Document Reviewed: 09/02/2007 [facility] Patient Information 2014 [place], [place].  _______________________________________________________________________

## 2015-03-30 ENCOUNTER — Encounter (HOSPITAL_COMMUNITY): Payer: Self-pay | Admitting: *Deleted

## 2015-03-30 ENCOUNTER — Inpatient Hospital Stay (HOSPITAL_COMMUNITY): Payer: Commercial Managed Care - HMO

## 2015-03-30 ENCOUNTER — Inpatient Hospital Stay (HOSPITAL_COMMUNITY): Payer: Commercial Managed Care - HMO | Admitting: Anesthesiology

## 2015-03-30 ENCOUNTER — Inpatient Hospital Stay (HOSPITAL_COMMUNITY)
Admission: RE | Admit: 2015-03-30 | Discharge: 2015-03-31 | DRG: 470 | Disposition: A | Discharge status: Home Health | Payer: Commercial Managed Care - HMO | Source: Ambulatory Visit | Attending: Orthopedic Surgery | Admitting: Orthopedic Surgery

## 2015-03-30 ENCOUNTER — Encounter (HOSPITAL_COMMUNITY)
Admission: RE | Disposition: A | Discharge status: Home Health | Payer: Self-pay | Source: Ambulatory Visit | Attending: Orthopedic Surgery

## 2015-03-30 DIAGNOSIS — Z471 Aftercare following joint replacement surgery: Secondary | ICD-10-CM | POA: Diagnosis not present

## 2015-03-30 DIAGNOSIS — M8580 Other specified disorders of bone density and structure, unspecified site: Secondary | ICD-10-CM | POA: Diagnosis not present

## 2015-03-30 DIAGNOSIS — M1611 Unilateral primary osteoarthritis, right hip: Principal | ICD-10-CM | POA: Diagnosis present

## 2015-03-30 DIAGNOSIS — K449 Diaphragmatic hernia without obstruction or gangrene: Secondary | ICD-10-CM | POA: Diagnosis present

## 2015-03-30 DIAGNOSIS — K219 Gastro-esophageal reflux disease without esophagitis: Secondary | ICD-10-CM | POA: Diagnosis present

## 2015-03-30 DIAGNOSIS — I10 Essential (primary) hypertension: Secondary | ICD-10-CM | POA: Diagnosis not present

## 2015-03-30 DIAGNOSIS — Z96641 Presence of right artificial hip joint: Secondary | ICD-10-CM | POA: Diagnosis not present

## 2015-03-30 DIAGNOSIS — M25551 Pain in right hip: Secondary | ICD-10-CM

## 2015-03-30 DIAGNOSIS — Z96649 Presence of unspecified artificial hip joint: Secondary | ICD-10-CM

## 2015-03-30 DIAGNOSIS — Z01812 Encounter for preprocedural laboratory examination: Secondary | ICD-10-CM

## 2015-03-30 DIAGNOSIS — M169 Osteoarthritis of hip, unspecified: Secondary | ICD-10-CM | POA: Diagnosis not present

## 2015-03-30 HISTORY — PX: TOTAL HIP ARTHROPLASTY: SHX124

## 2015-03-30 LAB — TYPE AND SCREEN
ABO/RH(D): B POS
Antibody Screen: NEGATIVE

## 2015-03-30 SURGERY — ARTHROPLASTY, HIP, TOTAL, ANTERIOR APPROACH
Anesthesia: General | Site: Hip | Laterality: Right

## 2015-03-30 MED ORDER — ALUM & MAG HYDROXIDE-SIMETH 200-200-20 MG/5ML PO SUSP: 30.0000 mL | ORAL | Status: DC | PRN

## 2015-03-30 MED ORDER — METOCLOPRAMIDE HCL 5 MG/ML IJ SOLN: 5.0000 mg | Freq: Three times a day (TID) | INTRAMUSCULAR | Status: DC | PRN

## 2015-03-30 MED ORDER — HYDROCHLOROTHIAZIDE 12.5 MG PO CAPS
12.5000 mg | ORAL_CAPSULE | Freq: Every day | ORAL | Status: DC
  Filled 2015-03-30: qty 1

## 2015-03-30 MED ORDER — HYDROMORPHONE HCL 2 MG/ML IJ SOLN
INTRAMUSCULAR | Status: AC
  Filled 2015-03-30: qty 1

## 2015-03-30 MED ORDER — PROPOFOL 10 MG/ML IV BOLUS
INTRAVENOUS | Status: AC
  Filled 2015-03-30: qty 20

## 2015-03-30 MED ORDER — DEXAMETHASONE SODIUM PHOSPHATE 10 MG/ML IJ SOLN
INTRAMUSCULAR | Status: AC
  Filled 2015-03-30: qty 1

## 2015-03-30 MED ORDER — TRANEXAMIC ACID 1000 MG/10ML IV SOLN
1000.0000 mg | Freq: Once | INTRAVENOUS | Status: AC
  Administered 2015-03-30: 1000 mg via INTRAVENOUS
  Filled 2015-03-30: qty 10

## 2015-03-30 MED ORDER — MEPERIDINE HCL 50 MG/ML IJ SOLN: 6.2500 mg | INTRAMUSCULAR | Status: DC | PRN

## 2015-03-30 MED ORDER — PROPOFOL 10 MG/ML IV BOLUS
INTRAVENOUS | Status: DC | PRN
  Administered 2015-03-30: 150 mg via INTRAVENOUS

## 2015-03-30 MED ORDER — PROMETHAZINE HCL 25 MG/ML IJ SOLN: 6.2500 mg | INTRAMUSCULAR | Status: DC | PRN

## 2015-03-30 MED ORDER — HYDROMORPHONE HCL 1 MG/ML IJ SOLN
0.2500 mg | INTRAMUSCULAR | Status: DC | PRN
  Administered 2015-03-30 (×2): 0.5 mg via INTRAVENOUS

## 2015-03-30 MED ORDER — DEXAMETHASONE SODIUM PHOSPHATE 10 MG/ML IJ SOLN
10.0000 mg | Freq: Once | INTRAMUSCULAR | Status: AC
  Administered 2015-03-30: 10 mg via INTRAVENOUS

## 2015-03-30 MED ORDER — FENTANYL CITRATE (PF) 100 MCG/2ML IJ SOLN
INTRAMUSCULAR | Status: AC
  Filled 2015-03-30: qty 2

## 2015-03-30 MED ORDER — HYDROMORPHONE HCL 1 MG/ML IJ SOLN
INTRAMUSCULAR | Status: AC
  Filled 2015-03-30: qty 1

## 2015-03-30 MED ORDER — METHOCARBAMOL 1000 MG/10ML IJ SOLN
500.0000 mg | Freq: Four times a day (QID) | INTRAVENOUS | Status: DC | PRN
  Administered 2015-03-30: 500 mg via INTRAVENOUS
  Filled 2015-03-30 (×2): qty 5

## 2015-03-30 MED ORDER — HYDROMORPHONE HCL 1 MG/ML IJ SOLN
INTRAMUSCULAR | Status: DC | PRN
  Administered 2015-03-30: 1 mg via INTRAVENOUS

## 2015-03-30 MED ORDER — ONDANSETRON HCL 4 MG/2ML IJ SOLN
INTRAMUSCULAR | Status: DC | PRN
  Administered 2015-03-30: 4 mg via INTRAVENOUS

## 2015-03-30 MED ORDER — PHENYLEPHRINE HCL 10 MG/ML IJ SOLN
INTRAMUSCULAR | Status: DC | PRN
  Administered 2015-03-30: 40 ug via INTRAVENOUS

## 2015-03-30 MED ORDER — MIDAZOLAM HCL 2 MG/2ML IJ SOLN
INTRAMUSCULAR | Status: AC
Start: 2015-03-30 — End: 2015-03-30
  Filled 2015-03-30: qty 2

## 2015-03-30 MED ORDER — DIPHENHYDRAMINE HCL 25 MG PO CAPS
25.0000 mg | ORAL_CAPSULE | Freq: Four times a day (QID) | ORAL | Status: DC | PRN
  Administered 2015-03-31 (×2): 25 mg via ORAL
  Filled 2015-03-30 (×2): qty 1

## 2015-03-30 MED ORDER — PHENYLEPHRINE 40 MCG/ML (10ML) SYRINGE FOR IV PUSH (FOR BLOOD PRESSURE SUPPORT)
PREFILLED_SYRINGE | INTRAVENOUS | Status: AC
  Filled 2015-03-30: qty 10

## 2015-03-30 MED ORDER — LIDOCAINE HCL (CARDIAC) 20 MG/ML IV SOLN
INTRAVENOUS | Status: DC | PRN
  Administered 2015-03-30: 50 mg via INTRAVENOUS

## 2015-03-30 MED ORDER — LIDOCAINE HCL (CARDIAC) 20 MG/ML IV SOLN
INTRAVENOUS | Status: AC
  Filled 2015-03-30: qty 5

## 2015-03-30 MED ORDER — METOCLOPRAMIDE HCL 10 MG PO TABS: 5.0000 mg | ORAL_TABLET | Freq: Three times a day (TID) | ORAL | Status: DC | PRN

## 2015-03-30 MED ORDER — CEFAZOLIN SODIUM-DEXTROSE 2-3 GM-% IV SOLR
INTRAVENOUS | Status: AC
  Filled 2015-03-30: qty 50

## 2015-03-30 MED ORDER — HYDROMORPHONE HCL 1 MG/ML IJ SOLN
0.5000 mg | INTRAMUSCULAR | Status: DC | PRN
  Administered 2015-03-30: 1 mg via INTRAVENOUS
  Filled 2015-03-30: qty 1

## 2015-03-30 MED ORDER — BISACODYL 10 MG RE SUPP: 10.0000 mg | Freq: Every day | RECTAL | Status: DC | PRN

## 2015-03-30 MED ORDER — EPHEDRINE SULFATE 50 MG/ML IJ SOLN
INTRAMUSCULAR | Status: AC
  Filled 2015-03-30: qty 1

## 2015-03-30 MED ORDER — PRAVASTATIN SODIUM 80 MG PO TABS
80.0000 mg | ORAL_TABLET | Freq: Every day | ORAL | Status: DC
  Administered 2015-03-30: 80 mg via ORAL
  Filled 2015-03-30 (×2): qty 1

## 2015-03-30 MED ORDER — EPHEDRINE SULFATE 50 MG/ML IJ SOLN
INTRAMUSCULAR | Status: DC | PRN
  Administered 2015-03-30 (×2): 5 mg via INTRAVENOUS

## 2015-03-30 MED ORDER — SODIUM CHLORIDE 0.9 % IJ SOLN
INTRAMUSCULAR | Status: AC
  Filled 2015-03-30: qty 10

## 2015-03-30 MED ORDER — POLYETHYLENE GLYCOL 3350 17 G PO PACK
17.0000 g | PACK | Freq: Two times a day (BID) | ORAL | Status: DC
  Administered 2015-03-31: 17 g via ORAL

## 2015-03-30 MED ORDER — DIPHENHYDRAMINE HCL (SLEEP) 25 MG PO TABS: 25.0000 mg | ORAL_TABLET | Freq: Every evening | ORAL | Status: DC | PRN

## 2015-03-30 MED ORDER — METHOCARBAMOL 500 MG PO TABS
500.0000 mg | ORAL_TABLET | Freq: Four times a day (QID) | ORAL | Status: DC | PRN
  Administered 2015-03-30: 500 mg via ORAL
  Filled 2015-03-30: qty 1

## 2015-03-30 MED ORDER — SODIUM CHLORIDE 0.9 % IV SOLN
100.0000 mL/h | INTRAVENOUS | Status: DC
  Administered 2015-03-30: 100 mL/h via INTRAVENOUS
  Filled 2015-03-30 (×3): qty 1000

## 2015-03-30 MED ORDER — ONDANSETRON HCL 4 MG/2ML IJ SOLN: 4.0000 mg | Freq: Four times a day (QID) | INTRAMUSCULAR | Status: DC | PRN

## 2015-03-30 MED ORDER — DEXAMETHASONE SODIUM PHOSPHATE 10 MG/ML IJ SOLN
10.0000 mg | Freq: Once | INTRAMUSCULAR | Status: AC
  Administered 2015-03-31: 10 mg via INTRAVENOUS
  Filled 2015-03-30: qty 1

## 2015-03-30 MED ORDER — FERROUS SULFATE 325 (65 FE) MG PO TABS
325.0000 mg | ORAL_TABLET | Freq: Three times a day (TID) | ORAL | Status: DC
  Administered 2015-03-31 (×2): 325 mg via ORAL
  Filled 2015-03-30 (×4): qty 1

## 2015-03-30 MED ORDER — MENTHOL 3 MG MT LOZG: 1.0000 | LOZENGE | OROMUCOSAL | Status: DC | PRN

## 2015-03-30 MED ORDER — FENTANYL CITRATE (PF) 100 MCG/2ML IJ SOLN
25.0000 ug | INTRAMUSCULAR | Status: DC | PRN
  Administered 2015-03-30 (×2): 50 ug via INTRAVENOUS

## 2015-03-30 MED ORDER — ONDANSETRON HCL 4 MG PO TABS: 4.0000 mg | ORAL_TABLET | Freq: Four times a day (QID) | ORAL | Status: DC | PRN

## 2015-03-30 MED ORDER — SUGAMMADEX SODIUM 200 MG/2ML IV SOLN
INTRAVENOUS | Status: AC
  Filled 2015-03-30: qty 2

## 2015-03-30 MED ORDER — ONDANSETRON HCL 4 MG/2ML IJ SOLN
INTRAMUSCULAR | Status: AC
  Filled 2015-03-30: qty 2

## 2015-03-30 MED ORDER — PHENOL 1.4 % MT LIQD: 1.0000 | OROMUCOSAL | Status: DC | PRN

## 2015-03-30 MED ORDER — ROCURONIUM BROMIDE 100 MG/10ML IV SOLN
INTRAVENOUS | Status: DC | PRN
  Administered 2015-03-30: 40 mg via INTRAVENOUS

## 2015-03-30 MED ORDER — FENTANYL CITRATE (PF) 100 MCG/2ML IJ SOLN
INTRAMUSCULAR | Status: DC | PRN
  Administered 2015-03-30: 100 ug via INTRAVENOUS
  Administered 2015-03-30 (×2): 50 ug via INTRAVENOUS

## 2015-03-30 MED ORDER — DOCUSATE SODIUM 100 MG PO CAPS
100.0000 mg | ORAL_CAPSULE | Freq: Two times a day (BID) | ORAL | Status: DC
  Administered 2015-03-30 – 2015-03-31 (×2): 100 mg via ORAL

## 2015-03-30 MED ORDER — CHLORHEXIDINE GLUCONATE 4 % EX LIQD: 60.0000 mL | Freq: Once | CUTANEOUS | Status: DC

## 2015-03-30 MED ORDER — MAGNESIUM CITRATE PO SOLN
1.0000 | Freq: Once | ORAL | Status: DC | PRN
Start: 2015-03-30 — End: 2015-03-31

## 2015-03-30 MED ORDER — SUGAMMADEX SODIUM 200 MG/2ML IV SOLN
INTRAVENOUS | Status: DC | PRN
  Administered 2015-03-30: 200 mg via INTRAVENOUS

## 2015-03-30 MED ORDER — LACTATED RINGERS IV SOLN
INTRAVENOUS | Status: DC
  Administered 2015-03-30 (×2): via INTRAVENOUS
  Administered 2015-03-30: 1000 mL via INTRAVENOUS

## 2015-03-30 MED ORDER — CEFAZOLIN SODIUM-DEXTROSE 2-3 GM-% IV SOLR
2.0000 g | INTRAVENOUS | Status: AC
  Administered 2015-03-30: 2 g via INTRAVENOUS

## 2015-03-30 MED ORDER — FLUOXETINE HCL 20 MG PO CAPS
20.0000 mg | ORAL_CAPSULE | Freq: Every day | ORAL | Status: DC
  Administered 2015-03-30: 20 mg via ORAL
  Filled 2015-03-30 (×2): qty 1

## 2015-03-30 MED ORDER — SODIUM CHLORIDE 0.9 % IR SOLN
Status: DC | PRN
  Administered 2015-03-30: 1000 mL

## 2015-03-30 MED ORDER — HYDROCODONE-ACETAMINOPHEN 7.5-325 MG PO TABS
1.0000 | ORAL_TABLET | ORAL | Status: DC
  Administered 2015-03-30 (×2): 2 via ORAL
  Administered 2015-03-30 (×2): 1 via ORAL
  Administered 2015-03-31 (×3): 2 via ORAL
  Filled 2015-03-30 (×3): qty 2
  Filled 2015-03-30: qty 1
  Filled 2015-03-30 (×2): qty 2
  Filled 2015-03-30: qty 1

## 2015-03-30 MED ORDER — CELECOXIB 200 MG PO CAPS
200.0000 mg | ORAL_CAPSULE | Freq: Two times a day (BID) | ORAL | Status: DC
  Administered 2015-03-31: 200 mg via ORAL
  Filled 2015-03-30 (×2): qty 1

## 2015-03-30 MED ORDER — ASPIRIN EC 325 MG PO TBEC
325.0000 mg | DELAYED_RELEASE_TABLET | Freq: Two times a day (BID) | ORAL | Status: DC
Start: 2015-03-31 — End: 2015-03-31
  Administered 2015-03-31: 325 mg via ORAL
  Filled 2015-03-30 (×3): qty 1

## 2015-03-30 MED ORDER — MIDAZOLAM HCL 5 MG/5ML IJ SOLN
INTRAMUSCULAR | Status: DC | PRN
  Administered 2015-03-30: 2 mg via INTRAVENOUS

## 2015-03-30 MED ORDER — PANTOPRAZOLE SODIUM 40 MG PO TBEC
40.0000 mg | DELAYED_RELEASE_TABLET | Freq: Two times a day (BID) | ORAL | Status: DC
  Administered 2015-03-30 – 2015-03-31 (×2): 40 mg via ORAL
  Filled 2015-03-30 (×3): qty 1

## 2015-03-30 MED ORDER — LIP MEDEX EX OINT
TOPICAL_OINTMENT | CUTANEOUS | Status: AC
  Filled 2015-03-30: qty 7

## 2015-03-30 MED ORDER — CEFAZOLIN SODIUM-DEXTROSE 2-3 GM-% IV SOLR
2.0000 g | Freq: Four times a day (QID) | INTRAVENOUS | Status: AC
  Administered 2015-03-30 (×2): 2 g via INTRAVENOUS
  Filled 2015-03-30 (×2): qty 50

## 2015-03-30 SURGICAL SUPPLY — 34 items
BAG DECANTER FOR FLEXI CONT (MISCELLANEOUS) IMPLANT
BAG ZIPLOCK 12X15 (MISCELLANEOUS) IMPLANT
CAPT HIP TOTAL 2 ×3 IMPLANT
CLOTH BEACON ORANGE TIMEOUT ST (SAFETY) ×3 IMPLANT
COVER PERINEAL POST (MISCELLANEOUS) ×3 IMPLANT
DRAPE STERI IOBAN 125X83 (DRAPES) ×3 IMPLANT
DRAPE U-SHAPE 47X51 STRL (DRAPES) ×6 IMPLANT
DRSG AQUACEL AG ADV 3.5X10 (GAUZE/BANDAGES/DRESSINGS) ×3 IMPLANT
DURAPREP 26ML APPLICATOR (WOUND CARE) ×3 IMPLANT
ELECT REM PT RETURN 15FT ADLT (MISCELLANEOUS) IMPLANT
ELECT REM PT RETURN 9FT ADLT (ELECTROSURGICAL) ×3
ELECTRODE REM PT RTRN 9FT ADLT (ELECTROSURGICAL) ×1 IMPLANT
GLOVE BIOGEL M 7.0 STRL (GLOVE) IMPLANT
GLOVE BIOGEL PI IND STRL 7.5 (GLOVE) ×1 IMPLANT
GLOVE BIOGEL PI IND STRL 8.5 (GLOVE) ×1 IMPLANT
GLOVE BIOGEL PI INDICATOR 7.5 (GLOVE) ×2
GLOVE BIOGEL PI INDICATOR 8.5 (GLOVE) ×2
GLOVE ECLIPSE 8.0 STRL XLNG CF (GLOVE) ×6 IMPLANT
GLOVE ORTHO TXT STRL SZ7.5 (GLOVE) ×3 IMPLANT
GOWN STRL REUS W/TWL LRG LVL3 (GOWN DISPOSABLE) ×3 IMPLANT
GOWN STRL REUS W/TWL XL LVL3 (GOWN DISPOSABLE) ×3 IMPLANT
HOLDER FOLEY CATH W/STRAP (MISCELLANEOUS) ×3 IMPLANT
LIQUID BAND (GAUZE/BANDAGES/DRESSINGS) ×3 IMPLANT
PACK ANTERIOR HIP CUSTOM (KITS) ×3 IMPLANT
SAW OSC TIP CART 19.5X105X1.3 (SAW) ×3 IMPLANT
SUT MNCRL AB 4-0 PS2 18 (SUTURE) ×3 IMPLANT
SUT VIC AB 1 CT1 36 (SUTURE) ×9 IMPLANT
SUT VIC AB 2-0 CT1 27 (SUTURE) ×4
SUT VIC AB 2-0 CT1 TAPERPNT 27 (SUTURE) ×2 IMPLANT
SUT VLOC 180 0 24IN GS25 (SUTURE) ×3 IMPLANT
TRAY FOLEY W/METER SILVER 14FR (SET/KITS/TRAYS/PACK) ×3 IMPLANT
TRAY FOLEY W/METER SILVER 16FR (SET/KITS/TRAYS/PACK) IMPLANT
WATER STERILE IRR 1500ML POUR (IV SOLUTION) ×3 IMPLANT
YANKAUER SUCT BULB TIP 10FT TU (MISCELLANEOUS) ×3 IMPLANT

## 2015-03-30 NOTE — Anesthesia Preprocedure Evaluation (Signed)
Anesthesia Evaluation  Patient identified by MRN, date of birth, ID band Patient awake    Reviewed: Allergy & Precautions, NPO status , Patient's Chart, lab work & pertinent test results  Airway Mallampati: II  TM Distance: >3 FB Neck ROM: Full    Dental no notable dental hx.    Pulmonary neg pulmonary ROS,    Pulmonary exam normal breath sounds clear to auscultation       Cardiovascular hypertension, Normal cardiovascular exam Rhythm:Regular Rate:Normal     Neuro/Psych negative neurological ROS  negative psych ROS   GI/Hepatic Neg liver ROS, hiatal hernia, GERD  Medicated and Controlled,  Endo/Other  negative endocrine ROS  Renal/GU negative Renal ROS  negative genitourinary   Musculoskeletal negative musculoskeletal ROS (+)   Abdominal   Peds negative pediatric ROS (+)  Hematology negative hematology ROS (+)   Anesthesia Other Findings   Reproductive/Obstetrics negative OB ROS                             Anesthesia Physical Anesthesia Plan  ASA: II  Anesthesia Plan: General   Post-op Pain Management:    Induction: Intravenous  Airway Management Planned: Oral ETT  Additional Equipment:   Intra-op Plan:   Post-operative Plan: Extubation in OR  Informed Consent: I have reviewed the patients History and Physical, chart, labs and discussed the procedure including the risks, benefits and alternatives for the proposed anesthesia with the patient or authorized representative who has indicated his/her understanding and acceptance.   Dental advisory given  Plan Discussed with: CRNA  Anesthesia Plan Comments:         Anesthesia Quick Evaluation

## 2015-03-30 NOTE — Progress Notes (Signed)
Utilization review completed.  

## 2015-03-30 NOTE — Interval H&P Note (Signed)
History and Physical Interval Note:  03/30/2015 9:04 AM  Connie Rowe  has presented today for surgery, with the diagnosis of right hip osteoarthritis  The various methods of treatment have been discussed with the patient and family. After consideration of risks, benefits and other options for treatment, the patient has consented to  Procedure(s): RIGHT TOTAL HIP ARTHROPLASTY ANTERIOR APPROACH (Right) as a surgical intervention .  The patient's history has been reviewed, patient examined, no change in status, stable for surgery.  I have reviewed the patient's chart and labs.  Questions were answered to the patient's satisfaction.     Mauri Pole

## 2015-03-30 NOTE — Op Note (Signed)
NAME:  Connie Rowe.: [id number]      MEDICAL RECORD NO.: [id number]      FACILITY:  [facility]      PHYSICIAN:  Paralee Cancel D  DATE OF BIRTH:  December 31, 1950     DATE OF PROCEDURE:  03/30/2015                                 OPERATIVE REPORT         PREOPERATIVE DIAGNOSIS: Right  hip osteoarthritis.      POSTOPERATIVE DIAGNOSIS:  Right hip osteoarthritis.      PROCEDURE:  Right total hip replacement through an anterior approach   utilizing DePuy THR system, component size 80mm pinnacle cup, a size 36+4 neutral   Altrex liner, a size 0 Hi Tri Lock stem with a 36+5 delta ceramic   ball.      SURGEON:  Pietro Cassis. Alvan Dame, M.D.      ASSISTANT:  Danae Orleans, PA-C      ANESTHESIA:  Spinal.      SPECIMENS:  None.      COMPLICATIONS:  None.      BLOOD LOSS:  200 cc     DRAINS:  None      INDICATION OF THE PROCEDURE:  Connie Rowe is a 65 y.o. female who had   presented to office for evaluation of right hip pain.  Radiographs revealed   progressive degenerative changes with bone-on-bone   articulation to the  hip joint.  The patient had painful limited range of   motion significantly affecting their overall quality of life.  The patient was failing to    respond to conservative measures, and at this point was ready   to proceed with more definitive measures.  The patient has noted progressive   degenerative changes in his hip, progressive problems and dysfunction   with regarding the hip prior to surgery.  Consent was obtained for   benefit of pain relief.  Specific risk of infection, DVT, component   failure, dislocation, need for revision surgery, as well discussion of   the anterior versus posterior approach were reviewed.  Consent was   obtained for benefit of anterior pain relief through an anterior   approach.      PROCEDURE IN DETAIL:  The patient was brought to operative theater.   Once adequate anesthesia,  preoperative antibiotics, 2gm of Ancef, 1 gm of Tranexamic Acid, and 10 mg of Decadron administered.   The patient was positioned supine on the OSI Hanna table.  Once adequate   padding of boney process was carried out, we had predraped out the hip, and  used fluoroscopy to confirm orientation of the pelvis and position.      The right hip was then prepped and draped from proximal iliac crest to   mid thigh with shower curtain technique.      Time-out was performed identifying the patient, planned procedure, and   extremity.     An incision was then made 2 cm distal and lateral to the   anterior superior iliac spine extending over the orientation of the   tensor fascia lata muscle and sharp dissection was carried down to the   fascia of the muscle and protractor placed in the soft tissues.      The fascia  was then incised.  The muscle belly was identified and swept   laterally and retractor placed along the superior neck.  Following   cauterization of the circumflex vessels and removing some pericapsular   fat, a second cobra retractor was placed on the inferior neck.  A third   retractor was placed on the anterior acetabulum after elevating the   anterior rectus.  A L-capsulotomy was along the line of the   superior neck to the trochanteric fossa, then extended proximally and   distally.  Tag sutures were placed and the retractors were then placed   intracapsular.  We then identified the trochanteric fossa and   orientation of my neck cut, confirmed this radiographically   and then made a neck osteotomy with the femur on traction.  The femoral   head was removed without difficulty or complication.  Traction was let   off and retractors were placed posterior and anterior around the   acetabulum.      The labrum and foveal tissue were debrided.  I began reaming with a 25mm   reamer and reamed up to 49mm reamer with good bony bed preparation and a 15mm   cup was chosen.  The final 26mm  Pinnacle cup was then impacted under fluoroscopy  to confirm the depth of penetration and orientation with respect to   abduction.  A screw was placed followed by the hole eliminator.  The final   36+4 neutral Altrex liner was impacted with good visualized rim fit.  The cup was positioned anatomically within the acetabular portion of the pelvis.      At this point, the femur was rolled at 80 degrees.  Further capsule was   released off the inferior aspect of the femoral neck.  I then   released the superior capsule proximally.  The hook was placed laterally   along the femur and elevated manually and held in position with the bed   hook.  The leg was then extended and adducted with the leg rolled to 100   degrees of external rotation.  Once the proximal femur was fully   exposed, I used a box osteotome to set orientation.  I then began   broaching with the starting chili pepper broach and passed this by hand and then broached up to 0.  With the 0 broach in place I chose a high offset neck and did several trial reductions.  The offset was appropriate, leg lengths   appeared best matched with the +5 head ball trial, confirmed radiographically.  She does appear to have degenerative changes in the left hip that may need to be addressed at some point.  Given these findings, I went ahead and dislocated the hip, repositioned all   retractors and positioned the right hip in the extended and abducted position.  The final 0 Hi Tri Lock stem was   chosen and it was impacted down to the level of neck cut.  Based on this   and the trial reduction, a 36+5 delta ceramic ball was chosen and   impacted onto a clean and dry trunnion, and the hip was reduced.  The   hip had been irrigated throughout the case again at this point.  I did   reapproximate the superior capsular leaflet to the anterior leaflet   using #1 Vicryl.  The fascia of the   tensor fascia lata muscle was then reapproximated using #1 Vicryl and  #0 V-lock sutures.  The  remaining wound was closed with 2-0 Vicryl and running 4-0 Monocryl.   The hip was cleaned, dried, and dressed sterilely using Dermabond and   Aquacel dressing.  She was then brought   to recovery room in stable condition tolerating the procedure well.    Mattew Babish, PA-C was present for the entirety of the case involved from   preoperative positioning, perioperative retractor management, general   facilitation of the case, as well as primary wound closure as assistant.            Pietro Cassis Alvan Dame, M.D.        03/30/2015 2:57 PM

## 2015-03-30 NOTE — Transfer of Care (Signed)
Immediate Anesthesia Transfer of Care Note  Patient: Connie Rowe  Procedure(s) Performed: Procedure(s): RIGHT TOTAL HIP ARTHROPLASTY ANTERIOR APPROACH (Right)  Patient Location: PACU  Anesthesia Type:General  Level of Consciousness:  sedated, patient cooperative and responds to stimulation  Airway & Oxygen Therapy:Patient Spontanous Breathing and Patient connected to face mask oxgen  Post-op Assessment:  Report given to PACU RN and Post -op Vital signs reviewed and stable  Post vital signs:  Reviewed and stable  Last Vitals:  Filed Vitals:   03/30/15 0715  BP: 143/81  Pulse: 80  Temp: 36.8 C  Resp: 16    Complications: No apparent anesthesia complications

## 2015-03-30 NOTE — Evaluation (Signed)
Physical Therapy Evaluation Patient Details Name: Connie Rowe MRN: [id number] DOB: 12/20/1950 Today's Date: 03/30/2015   History of Present Illness  R THR  Clinical Impression  Pt s/p R THR presents with decreased R LE strength/ROM and post op pain limiting functional mobility.  Pt should progress well to dc home with family assist.    Follow Up Recommendations Home health PT    Equipment Recommendations  Rolling walker with 5" wheels    Recommendations for Other Services OT consult     Precautions / Restrictions Precautions Precautions: Fall Restrictions Weight Bearing Restrictions: No Other Position/Activity Restrictions: WBAT      Mobility  Bed Mobility Overal bed mobility: Needs Assistance Bed Mobility: Supine to Sit     Supine to sit: Min assist     General bed mobility comments: cues for sequence and use of L LE to self assist  Transfers Overall transfer level: Needs assistance Equipment used: Rolling walker (2 wheeled) Transfers: Sit to/from Stand Sit to Stand: Min assist         General transfer comment: cues for LE management and use of UEs to self assist  Ambulation/Gait Ambulation/Gait assistance: Min assist Ambulation Distance (Feet): 30 Feet Assistive device: Rolling walker (2 wheeled) Gait Pattern/deviations: Step-to pattern;Decreased step length - right;Decreased step length - left;Shuffle;Trunk flexed Gait velocity: decr   General Gait Details: cues for sequence, posture and position from [employer]            Wheelchair Mobility    Modified Rankin (Stroke Patients Only)       Balance                                             Pertinent Vitals/Pain Pain Assessment: 0-10 Pain Score: 5  Pain Location: R hip Pain Descriptors / Indicators: Aching;Sore Pain Intervention(s): Limited activity within patient's tolerance;Monitored during session;Premedicated before session;Ice applied    Home Living  Family/patient expects to be discharged to:: Private residence Living Arrangements: Spouse/significant other Available Help at Discharge: Family Type of Home: House Home Access: Stairs to enter Entrance Stairs-Rails: None Entrance Stairs-Number of Steps: 3 Home Layout: One level Home Equipment: Cane - single point      Prior Function Level of Independence: Independent;Independent with assistive device(s)               Hand Dominance        Extremity/Trunk Assessment   Upper Extremity Assessment: Overall WFL for tasks assessed           Lower Extremity Assessment: RLE deficits/detail      Cervical / Trunk Assessment: Normal  Communication   Communication: No difficulties  Cognition Arousal/Alertness: Awake/alert Behavior During Therapy: WFL for tasks assessed/performed Overall Cognitive Status: Within Functional Limits for tasks assessed                      General Comments      Exercises Total Joint Exercises Ankle Circles/Pumps: AROM;Both;15 reps;Supine      Assessment/Plan    PT Assessment Patient needs continued PT services  PT Diagnosis Difficulty walking   PT Problem List Decreased strength;Decreased range of motion;Decreased activity tolerance;Decreased mobility;Decreased knowledge of use of DME;Pain  PT Treatment Interventions DME instruction;Gait training;Stair training;Functional mobility training;Therapeutic activities;Therapeutic exercise;Patient/family education   PT Goals (Current goals can be found in the Care Plan section) Acute  Rehab PT Goals Patient Stated Goal: Regain IND  PT Goal Formulation: With patient Time For Goal Achievement: 04/02/15 Potential to Achieve Goals: Good    Frequency 7X/week   Barriers to discharge        Co-evaluation               End of Session Equipment Utilized During Treatment: Gait belt Activity Tolerance: Patient tolerated treatment well Patient left: in chair;with call  bell/phone within reach;with family/visitor present Nurse Communication: Mobility status         Time: [id number] PT Time Calculation (min) (ACUTE ONLY): 23 min   Charges:   PT Evaluation $PT Eval Low Complexity: 1 Procedure PT Treatments $Gait Training: 8-22 mins   PT G Codes:        , 04/19/15, 6:18 PM

## 2015-03-30 NOTE — Anesthesia Postprocedure Evaluation (Signed)
Anesthesia Post Note  Patient: Connie Rowe  Procedure(s) Performed: Procedure(s) (LRB): RIGHT TOTAL HIP ARTHROPLASTY ANTERIOR APPROACH (Right)  Patient location during evaluation: PACU Anesthesia Type: General Level of consciousness: awake and alert Pain management: pain level controlled Vital Signs Assessment: post-procedure vital signs reviewed and stable Respiratory status: spontaneous breathing, nonlabored ventilation, respiratory function stable and patient connected to nasal cannula oxygen Cardiovascular status: blood pressure returned to baseline and stable Postop Assessment: no signs of nausea or vomiting Anesthetic complications: no    Last Vitals:  Filed Vitals:   03/30/15 1230 03/30/15 1345  BP: 166/83   Pulse:    Temp:  36.8 C  Resp:      Last Pain:  Filed Vitals:   03/30/15 1348  PainSc: 3                  [name]

## 2015-03-30 NOTE — Anesthesia Procedure Notes (Signed)
Procedure Name: Intubation Date/Time: 03/30/2015 10:28 AM Performed by: Anne Fu Pre-anesthesia Checklist: Patient identified, Emergency Drugs available, Suction available, Patient being monitored and Timeout performed Patient Re-evaluated:Patient Re-evaluated prior to inductionOxygen Delivery Method: Circle system utilized Preoxygenation: Pre-oxygenation with 100% oxygen Intubation Type: IV induction Ventilation: Mask ventilation without difficulty Laryngoscope Size: Mac and 4 Grade View: Grade I Tube type: Oral Tube size: 7.5 mm Number of attempts: 1 Airway Equipment and Method: Stylet Placement Confirmation: ETT inserted through vocal cords under direct vision,  positive ETCO2,  CO2 detector and breath sounds checked- equal and bilateral Secured at: 23 cm Tube secured with: Tape Dental Injury: Teeth and Oropharynx as per pre-operative assessment

## 2015-03-31 LAB — CBC
HCT: 32.6 % — ABNORMAL LOW (ref 36.0–46.0)
Hemoglobin: 10.5 g/dL — ABNORMAL LOW (ref 12.0–15.0)
MCH: 29.6 pg (ref 26.0–34.0)
MCHC: 32.2 g/dL (ref 30.0–36.0)
MCV: 91.8 fL (ref 78.0–100.0)
PLATELETS: 218 10*3/uL (ref 150–400)
RBC: 3.55 MIL/uL — AB (ref 3.87–5.11)
RDW: 14.2 % (ref 11.5–15.5)
WBC: 8.1 10*3/uL (ref 4.0–10.5)

## 2015-03-31 LAB — BASIC METABOLIC PANEL
Anion gap: 9 (ref 5–15)
BUN: 13 mg/dL (ref 6–20)
CALCIUM: 8.7 mg/dL — AB (ref 8.9–10.3)
CO2: 27 mmol/L (ref 22–32)
CREATININE: 0.92 mg/dL (ref 0.44–1.00)
Chloride: 102 mmol/L (ref 101–111)
GFR calc Af Amer: >60 mL/min (ref >60)
Glucose, Bld: 175 mg/dL — ABNORMAL HIGH (ref 65–99)
Potassium: 4.5 mmol/L (ref 3.5–5.1)
SODIUM: 138 mmol/L (ref 135–145)

## 2015-03-31 MED ORDER — FERROUS SULFATE 325 (65 FE) MG PO TABS: 325.0000 mg | ORAL_TABLET | Freq: Three times a day (TID) | ORAL | Status: DC

## 2015-03-31 MED ORDER — HYDROCODONE-ACETAMINOPHEN 7.5-325 MG PO TABS: 1.0000 | ORAL_TABLET | ORAL | Status: DC | PRN

## 2015-03-31 MED ORDER — DOCUSATE SODIUM 100 MG PO CAPS: 100.0000 mg | ORAL_CAPSULE | Freq: Two times a day (BID) | ORAL | Status: DC

## 2015-03-31 MED ORDER — ASPIRIN 325 MG PO TBEC: 325.0000 mg | DELAYED_RELEASE_TABLET | Freq: Two times a day (BID) | ORAL | Status: AC

## 2015-03-31 MED ORDER — TIZANIDINE HCL 4 MG PO TABS: 4.0000 mg | ORAL_TABLET | Freq: Four times a day (QID) | ORAL | Status: DC | PRN

## 2015-03-31 MED ORDER — POLYETHYLENE GLYCOL 3350 17 G PO PACK: 17.0000 g | PACK | Freq: Two times a day (BID) | ORAL | Status: DC

## 2015-03-31 NOTE — Progress Notes (Signed)
Physical Therapy Treatment Patient Details Name: Connie Rowe MRN: [id number] DOB: 03/08/1950 Today's Date: 03/31/2015    History of Present Illness R THR    PT Comments    Pt progressing well with mobility and hopeful for dc home this pm.  Follow Up Recommendations  Home health PT     Equipment Recommendations  Rolling walker with 5" wheels    Recommendations for Other Services OT consult     Precautions / Restrictions Precautions Precautions: Fall Restrictions Weight Bearing Restrictions: No Other Position/Activity Restrictions: WBAT    Mobility  Bed Mobility Overal bed mobility: Needs Assistance Bed Mobility: Supine to Sit     Supine to sit: Min assist     General bed mobility comments: cues for sequence and use of R L LE to self assist  Transfers Overall transfer level: Needs assistance Equipment used: Rolling walker (2 wheeled) Transfers: Sit to/from Stand Sit to Stand: Min assist Stand pivot transfers: Min assist       General transfer comment: cues for LE management and use of UEs to self assist  Ambulation/Gait Ambulation/Gait assistance: Min assist;Min guard Ambulation Distance (Feet): 200 Feet Assistive device: Rolling walker (2 wheeled) Gait Pattern/deviations: Step-to pattern;Step-through pattern;Decreased step length - right;Decreased step length - left;Shuffle;Trunk flexed Gait velocity: decr   General Gait Details: cues for sequence, posture and position from [employer]            Wheelchair Mobility    Modified Rankin (Stroke Patients Only)       Balance                                    Cognition Arousal/Alertness: Awake/alert Behavior During Therapy: WFL for tasks assessed/performed Overall Cognitive Status: Within Functional Limits for tasks assessed                      Exercises Total Joint Exercises Ankle Circles/Pumps: AROM;Both;15 reps;Supine Quad Sets: AROM;Both;10  reps;Supine Heel Slides: AAROM;Right;20 reps;Supine Hip ABduction/ADduction: AAROM;Right;15 reps;Supine    General Comments        Pertinent Vitals/Pain Pain Assessment: 0-10 Pain Score: 3  Pain Location: R hip Pain Descriptors / Indicators: Aching;Sore Pain Intervention(s): Limited activity within patient's tolerance;Monitored during session;Premedicated before session;Ice applied    Home Living Family/patient expects to be discharged to:: Private residence Living Arrangements: Spouse/significant other Available Help at Discharge: Family Type of Home: House Home Access: Stairs to enter Entrance Stairs-Rails: None Home Layout: One level Home Equipment: Cane - single point      Prior Function Level of Independence: Independent;Independent with assistive device(s)          PT Goals (current goals can now be found in the care plan section) Acute Rehab PT Goals Patient Stated Goal: Regain IND  PT Goal Formulation: With patient Time For Goal Achievement: 04/02/15 Potential to Achieve Goals: Good Progress towards PT goals: Progressing toward goals    Frequency  7X/week    PT Plan Current plan remains appropriate    Co-evaluation             End of Session Equipment Utilized During Treatment: Gait belt Activity Tolerance: Patient tolerated treatment well Patient left: in chair;with call bell/phone within reach;with family/visitor present     Time: 0942-1009 PT Time Calculation (min) (ACUTE ONLY): 27 min  Charges:  $Gait Training: 8-22 mins $Therapeutic Exercise: 8-22 mins  G Codes:      , 04/14/15, 11:46 AM

## 2015-03-31 NOTE — Evaluation (Signed)
Occupational Therapy Evaluation Patient Details Name: Connie Rowe MRN: [id number] DOB: 1950/09/27 Today's Date: 03/31/2015    History of Present Illness R THR   Clinical Impression   OT education complete. No further OT needed    Follow Up Recommendations  No OT follow up    Equipment Recommendations  3 in 1 bedside comode    Recommendations for Other Services       Precautions / Restrictions Precautions Precautions: Fall Restrictions Weight Bearing Restrictions: No Other Position/Activity Restrictions: WBAT      Mobility Bed Mobility Overal bed mobility: Needs Assistance Bed Mobility: Supine to Sit     Supine to sit: Min assist     General bed mobility comments: cues for sequence and use of R L LE to self assist  Transfers Overall transfer level: Needs assistance Equipment used: Rolling walker (2 wheeled) Transfers: Sit to/from Stand Sit to Stand: Min assist Stand pivot transfers: Min assist       General transfer comment: cues for LE management and use of UEs to self assist    Balance                                            ADL Overall ADL's : Needs assistance/impaired     Grooming: Oral care;Standing;Supervision/safety       Lower Body Bathing: Minimal assistance;Sit to/from stand;Cueing for sequencing;Cueing for safety           Toilet Transfer: Min guard;RW;Comfort height toilet;Ambulation   Toileting- Clothing Manipulation and Hygiene: Min guard;Sit to/from stand;Cueing for sequencing;Cueing for safety     Tub/Shower Transfer Details (indicate cue type and reason): verbalized technique                   Pertinent Vitals/Pain Pain Assessment: 0-10 Pain Score: 3  Pain Location: R hip Pain Descriptors / Indicators: Aching;Sore Pain Intervention(s): Limited activity within patient's tolerance;Monitored during session;Premedicated before session;Ice applied     Hand Dominance     Extremity/Trunk  Assessment Upper Extremity Assessment Upper Extremity Assessment: Overall WFL for tasks assessed       Cervical / Trunk Assessment Cervical / Trunk Assessment: Normal   Communication Communication Communication: No difficulties   Cognition Arousal/Alertness: Awake/alert Behavior During Therapy: WFL for tasks assessed/performed Overall Cognitive Status: Within Functional Limits for tasks assessed                     General Comments       Exercises Exercises: Total Joint     Shoulder Instructions      Home Living Family/patient expects to be discharged to:: Private residence Living Arrangements: Spouse/significant other Available Help at Discharge: Family Type of Home: House Home Access: Stairs to enter Technical brewer of Steps: 3 Entrance Stairs-Rails: None Home Layout: One level               Home Equipment: Cane - single point          Prior Functioning/Environment Level of Independence: Independent;Independent with assistive device(s)                      OT Goals(Current goals can be found in the care plan section) Acute Rehab OT Goals Patient Stated Goal: Regain IND   OT Frequency:  End of Session Equipment Utilized During Treatment: Surveyor, mining Communication: Mobility status  Activity Tolerance: Patient tolerated treatment well Patient left: in chair;with call bell/phone within reach;with family/visitor present   Time: [id number] OT Time Calculation (min): 19 min Charges:  OT General Charges $OT Visit: 1 Procedure OT Evaluation $OT Eval Low Complexity: 1 Procedure G-Codes:    Betsy Pries April 29, 2015, 11:47 AM

## 2015-03-31 NOTE — Progress Notes (Signed)
[facility]  Delivered rw and commode to hospital room   Linward Headland 03/31/2015, 10:19 AM

## 2015-03-31 NOTE — Progress Notes (Signed)
     Subjective: 1 Day Post-Op Procedure(s) (LRB): RIGHT TOTAL HIP ARTHROPLASTY ANTERIOR APPROACH (Right)   Patient reports pain as mild, pain controlled.  No events throughout the night.  Ready to be discharged home.  Objective:   VITALS:   Filed Vitals:   03/30/15 2226 03/31/15 0450  BP: 129/59 115/59  Pulse: 90 88  Temp: 98.8 F (37.1 C) 98.2 F (36.8 C)  Resp: 16 16    Dorsiflexion/Plantar flexion intact Incision: dressing C/D/I No cellulitis present Compartment soft  LABS  Recent Labs  03/31/15 0355  HGB 10.5*  HCT 32.6*  WBC 8.1  PLT 218     Recent Labs  03/31/15 0355  NA 138  K 4.5  BUN 13  CREATININE 0.92  GLUCOSE 175*     Assessment/Plan: 1 Day Post-Op Procedure(s) (LRB): RIGHT TOTAL HIP ARTHROPLASTY ANTERIOR APPROACH (Right) Foley cath d/c'ed Advance diet Up with therapy D/C IV fluids Discharge home with home health         [place].    PAC  03/31/2015, 8:43 AM

## 2015-03-31 NOTE — Care Management Note (Signed)
Case Management Note  Patient Details  Name: Connie Rowe MRN: [id number] Date of Birth: 1950-04-01  Subjective/Objective:                  RIGHT TOTAL HIP ARTHROPLASTY ANTERIOR APPROACH (Right)  Action/Plan: Discharge planning Expected Discharge Date:  04/01/15               Expected Discharge Plan:  Home/Self Care  In-House Referral:     Discharge planning Services  CM Consult  Post Acute Care Choice:  Durable Medical Equipment, Home Health Choice offered to:  Patient  DME Arranged:  3-N-1, Walker rolling DME Agency:  NA  HH Arranged:  NA HH Agency:  NA  Status of Service:  Completed, signed off  Medicare Important Message Given:    Date Medicare IM Given:    Medicare IM give by:    Date Additional Medicare IM Given:    Additional Medicare Important Message give by:     If discussed at [place] of Stay Meetings, dates discussed:    Additional Comments: CM met with pt in room to discuss home health and pt declines all home health services.  CM called [facility] DME rep Lecretia to please deliver the 3n1 and rolling walker to room prior to discharge.  No other CM needs were communicated. Dellie Catholic, RN 03/31/2015, 12:46 PM

## 2015-03-31 NOTE — Progress Notes (Signed)
Physical Therapy Treatment Patient Details Name: Connie Rowe MRN: [id number] DOB: 07-Aug-1950 Today's Date: 03/31/2015    History of Present Illness R THR    PT Comments    Pt progressing well and eager for return home.  Reviewed home therex, stairs and car transfers.  Follow Up Recommendations  Home health PT     Equipment Recommendations  Rolling walker with 5" wheels    Recommendations for Other Services OT consult     Precautions / Restrictions Precautions Precautions: Fall Restrictions Weight Bearing Restrictions: No Other Position/Activity Restrictions: WBAT    Mobility  Bed Mobility Overal bed mobility: Needs Assistance Bed Mobility: Supine to Sit;Sit to Supine     Supine to sit: Min guard Sit to supine: Min guard   General bed mobility comments: cues for sequence and use of R L LE to self assist  Transfers Overall transfer level: Needs assistance Equipment used: Rolling walker (2 wheeled) Transfers: Sit to/from Stand Sit to Stand: Supervision         General transfer comment: cues for LE management and use of UEs to self assist  Ambulation/Gait Ambulation/Gait assistance: Min guard;Supervision Ambulation Distance (Feet): 200 Feet Assistive device: Rolling walker (2 wheeled) Gait Pattern/deviations: Step-to pattern;Step-through pattern;Decreased step length - right;Decreased step length - left;Shuffle;Antalgic;Trunk flexed Gait velocity: decr   General Gait Details: cues for initial sequence, posture and position from RW   Stairs Stairs: Yes Stairs assistance: Min assist Stair Management: No rails;Step to pattern;Backwards;With walker Number of Stairs: 4 General stair comments: 2 stairs twice with spouse assisting on 2nd attempt.  Cues for sequence and foot/RW placement  Wheelchair Mobility    Modified Rankin (Stroke Patients Only)       Balance                                    Cognition Arousal/Alertness:  Awake/alert Behavior During Therapy: WFL for tasks assessed/performed Overall Cognitive Status: Within Functional Limits for tasks assessed                      Exercises Total Joint Exercises Ankle Circles/Pumps: AROM;Both;15 reps;Supine Quad Sets: AROM;Both;10 reps;Supine Heel Slides: AAROM;Right;Supine;10 reps Hip ABduction/ADduction: AAROM;Right;Supine;10 reps Long Arc Quad: AROM;Right;10 reps;Seated    General Comments        Pertinent Vitals/Pain Pain Assessment: 0-10 Pain Score: 3  Pain Location: R hip Pain Descriptors / Indicators: Aching;Burning;Sore Pain Intervention(s): Limited activity within patient's tolerance;Monitored during session;Premedicated before session;Ice applied    Home Living                      Prior Function            PT Goals (current goals can now be found in the care plan section) Acute Rehab PT Goals Patient Stated Goal: Regain IND  PT Goal Formulation: With patient Time For Goal Achievement: 04/02/15 Potential to Achieve Goals: Good Progress towards PT goals: Progressing toward goals    Frequency  7X/week    PT Plan Current plan remains appropriate    Co-evaluation             End of Session Equipment Utilized During Treatment: Gait belt Activity Tolerance: Patient tolerated treatment well Patient left: in bed;with call bell/phone within reach;with family/visitor present     Time: 1345-1432 PT Time Calculation (min) (ACUTE ONLY): 47 min  Charges:  $Gait Training: 8-22 mins $  Therapeutic Exercise: 8-22 mins $Therapeutic Activity: 8-22 mins                    G Codes:      , 04-14-2015, 4:00 PM

## 2015-03-31 NOTE — Discharge Instructions (Signed)

## 2015-04-05 NOTE — Discharge Summary (Signed)
Physician Discharge Summary  Patient ID: Connie Rowe MRN: [id number] DOB/AGE: January 07, 1951 65 y.o.  Admit date: 03/30/2015 Discharge date: 03/31/2015   Procedures:  Procedure(s) (LRB): RIGHT TOTAL HIP ARTHROPLASTY ANTERIOR APPROACH (Right)  Attending Physician:  Dr. Paralee Cancel   Admission Diagnoses:   Right hip primary OA / pain  Discharge Diagnoses:  Principal Problem:   S/P right THA, AA  Past Medical History  Diagnosis Date  . Depression   . GERD (gastroesophageal reflux disease)   . Hyperlipidemia   . Pancreatitis     History of  . Osteopenia   . Esophageal stricture   . Hypertension   . History of hiatal hernia   . Arthritis   . H/O fracture of humerus 2006  . Falls   . History of bronchitis   . Nocturia   . Colon polyps     HPI:    Connie Rowe, 65 y.o. female, has a history of pain and functional disability in the right hip(s) due to arthritis and patient has failed non-surgical conservative treatments for greater than 12 weeks to include NSAID's and/or analgesics and activity modification. Onset of symptoms was gradual starting 4+ years ago with gradually worsening course since that time.The patient noted no past surgery on the right hip(s). Patient currently rates pain in the right hip at 10 out of 10 with activity. Patient has night pain, worsening of pain with activity and weight bearing, trendelenberg gait, pain that interfers with activities of daily living and pain with passive range of motion. Patient has evidence of periarticular osteophytes and joint space narrowing by imaging studies. This condition presents safety issues increasing the risk of falls. There is no current active infection. Risks, benefits and expectations were discussed with the patient. Risks including but not limited to the risk of anesthesia, blood clots, nerve damage, blood vessel damage, failure of the prosthesis, infection and up to and including death. Patient understand  the risks, benefits and expectations and wishes to proceed with surgery.   PCP: Loura Pardon, MD   Discharged Condition: good  Hospital Course:  Patient underwent the above stated procedure on 03/30/2015. Patient tolerated the procedure well and brought to the recovery room in good condition and subsequently to the floor.  POD #1 BP: 115/59 ; Pulse: 88 ; Temp: 98.2 F (36.8 C) ; Resp: 16 Patient reports pain as mild, pain controlled. No events throughout the night. Ready to be discharged home. Dorsiflexion/plantar flexion intact, incision: dressing C/D/I, no cellulitis present and compartment soft.   LABS  Basename    HGB     10.5  HCT     32.6    Discharge Exam: General appearance: alert, cooperative and no distress Extremities: Homans sign is negative, no sign of DVT, no edema, redness or tenderness in the calves or thighs and no ulcers, gangrene or trophic changes  Disposition: Home with follow up in 2 weeks   Follow-up Information    Follow up with Mauri Pole, MD. Schedule an appointment as soon as possible for a visit in 2 weeks.   Specialty:  Orthopedic Surgery   Contact information:   [address] [place] [address] [phone]       Follow up with [facility].   Why:  rolling walker and 3n1   Contact information:   [address] [place] [address] [phone]       Discharge Instructions    Call MD / Call  911    Complete by:  As directed   If you experience chest pain or shortness of breath, CALL 911 and be transported to the hospital emergency room.  If you develope a fever above 101 F, pus (white drainage) or increased drainage or redness at the wound, or calf pain, call your surgeon's office.     Change dressing    Complete by:  As directed   Maintain surgical dressing until follow up in the clinic. If the edges start to pull up, may reinforce with tape. If the dressing is no longer working, may remove  and cover with gauze and tape, but must keep the area dry and clean.  Call with any questions or concerns.     Constipation Prevention    Complete by:  As directed   Drink plenty of fluids.  Prune juice may be helpful.  You may use a stool softener, such as Colace (over the counter) 100 mg twice a day.  Use MiraLax (over the counter) for constipation as needed.     Diet - low sodium heart healthy    Complete by:  As directed      Discharge instructions    Complete by:  As directed   Maintain surgical dressing until follow up in the clinic. If the edges start to pull up, may reinforce with tape. If the dressing is no longer working, may remove and cover with gauze and tape, but must keep the area dry and clean.  Follow up in 2 weeks at [facility]. Call with any questions or concerns.     Increase activity slowly as tolerated    Complete by:  As directed   Weight bearing as tolerated with assist device (walker, cane, etc) as directed, use it as long as suggested by your surgeon or therapist, typically at least 4-6 weeks.     TED hose    Complete by:  As directed   Use stockings (TED hose) for 2 weeks on both leg(s).  You may remove them at night for sleeping.             Medication List    STOP taking these medications        acetaminophen 650 MG CR tablet  Commonly known as:  TYLENOL      TAKE these medications        aspirin 325 MG EC tablet  Take 1 tablet (325 mg total) by mouth 2 (two) times daily.     calcium-vitamin D 500-200 MG-UNIT tablet  Take 1 tablet by mouth daily.     diphenhydrAMINE 25 MG tablet  Commonly known as:  SOMINEX  Take 25 mg by mouth at bedtime as needed for sleep.     docusate sodium 100 MG capsule  Commonly known as:  COLACE  Take 1 capsule (100 mg total) by mouth 2 (two) times daily.     ferrous sulfate 325 (65 FE) MG tablet  Take 1 tablet (325 mg total) by mouth 3 (three) times daily after meals.     FLUoxetine 20 MG capsule    Commonly known as:  PROZAC  TAKE ONE CAPSULE BY MOUTH EVERY DAY     hydrochlorothiazide 12.5 MG capsule  Commonly known as:  MICROZIDE  TAKE 1 CAPSULE BY MOUTH ONCE DAILY     HYDROcodone-acetaminophen 7.5-325 MG tablet  Commonly known as:  NORCO  Take 1-2 tablets by mouth every 4 (four) hours as needed for moderate pain.  pantoprazole 40 MG tablet  Commonly known as:  PROTONIX  Take 1 tablet (40 mg total) by mouth 2 (two) times daily.     polyethylene glycol packet  Commonly known as:  MIRALAX / GLYCOLAX  Take 17 g by mouth 2 (two) times daily.     pravastatin 80 MG tablet  Commonly known as:  PRAVACHOL  TAKE ONE TABLET BY MOUTH EVERY DAY     tiZANidine 4 MG tablet  Commonly known as:  ZANAFLEX  Take 1 tablet (4 mg total) by mouth every 6 (six) hours as needed for muscle spasms.         Signed: [place] Pugh.    PA-C  04/05/2015, 10:28 AM

## 2015-05-19 DIAGNOSIS — Z96641 Presence of right artificial hip joint: Secondary | ICD-10-CM | POA: Diagnosis not present

## 2015-05-19 DIAGNOSIS — Z471 Aftercare following joint replacement surgery: Secondary | ICD-10-CM | POA: Diagnosis not present

## 2015-07-28 ENCOUNTER — Ambulatory Visit: Payer: Commercial Managed Care - HMO

## 2015-08-17 ENCOUNTER — Other Ambulatory Visit: Payer: Self-pay | Admitting: Family Medicine

## 2015-08-18 ENCOUNTER — Other Ambulatory Visit: Payer: Self-pay | Admitting: Family Medicine

## 2015-09-27 ENCOUNTER — Telehealth: Payer: Self-pay | Admitting: Family Medicine

## 2015-09-27 ENCOUNTER — Ambulatory Visit: Payer: Commercial Managed Care - HMO

## 2015-09-27 DIAGNOSIS — Z0000 Encounter for general adult medical examination without abnormal findings: Secondary | ICD-10-CM

## 2015-09-27 DIAGNOSIS — Z1159 Encounter for screening for other viral diseases: Secondary | ICD-10-CM | POA: Insufficient documentation

## 2015-09-27 NOTE — Telephone Encounter (Signed)
-----   Message from Eustace Pen, LPN sent at [id number]  4:49 PM EDT ----- Regarding: Lab Orders Dr. Glori Bickers,  Will you please add Hep C to any lab orders that you would like pt to complete prior to CPE? Will schedule with pt during AWV.

## 2015-10-15 ENCOUNTER — Ambulatory Visit (INDEPENDENT_AMBULATORY_CARE_PROVIDER_SITE_OTHER): Payer: Commercial Managed Care - HMO

## 2015-10-15 VITALS — BP 118/88 | HR 73 | Temp 98.4°F | Ht 64.0 in | Wt 171.8 lb

## 2015-10-15 DIAGNOSIS — Z0000 Encounter for general adult medical examination without abnormal findings: Secondary | ICD-10-CM

## 2015-10-15 DIAGNOSIS — Z1239 Encounter for other screening for malignant neoplasm of breast: Secondary | ICD-10-CM

## 2015-10-15 DIAGNOSIS — Z23 Encounter for immunization: Secondary | ICD-10-CM | POA: Diagnosis not present

## 2015-10-15 DIAGNOSIS — Z1159 Encounter for screening for other viral diseases: Secondary | ICD-10-CM | POA: Diagnosis not present

## 2015-10-15 LAB — CBC WITH DIFFERENTIAL/PLATELET
BASOS PCT: 0.8 % (ref 0.0–3.0)
Basophils Absolute: 0 10*3/uL (ref 0.0–0.1)
EOS ABS: 0.2 10*3/uL (ref 0.0–0.7)
EOS PCT: 3.1 % (ref 0.0–5.0)
HCT: 38.4 % (ref 36.0–46.0)
Hemoglobin: 12.8 g/dL (ref 12.0–15.0)
LYMPHS ABS: 2.1 10*3/uL (ref 0.7–4.0)
Lymphocytes Relative: 43.1 % (ref 12.0–46.0)
MCHC: 33.5 g/dL (ref 30.0–36.0)
MCV: 85.5 fl (ref 78.0–100.0)
MONO ABS: 0.4 10*3/uL (ref 0.1–1.0)
Monocytes Relative: 8 % (ref 3.0–12.0)
NEUTROS ABS: 2.2 10*3/uL (ref 1.4–7.7)
Neutrophils Relative %: 45 % (ref 43.0–77.0)
PLATELETS: 252 10*3/uL (ref 150.0–400.0)
RBC: 4.49 Mil/uL (ref 3.87–5.11)
RDW: 15.7 % — AB (ref 11.5–15.5)
WBC: 5 10*3/uL (ref 4.0–10.5)

## 2015-10-15 LAB — COMPREHENSIVE METABOLIC PANEL
ALT: 14 U/L (ref 0–35)
AST: 16 U/L (ref 0–37)
Albumin: 4.1 g/dL (ref 3.5–5.2)
Alkaline Phosphatase: 70 U/L (ref 39–117)
BUN: 14 mg/dL (ref 6–23)
CALCIUM: 8.9 mg/dL (ref 8.4–10.5)
CHLORIDE: 102 meq/L (ref 96–112)
CO2: 31 meq/L (ref 19–32)
CREATININE: 1.19 mg/dL (ref 0.40–1.20)
GFR: 48.06 mL/min — ABNORMAL LOW (ref >60.00)
Glucose, Bld: 107 mg/dL — ABNORMAL HIGH (ref 70–99)
POTASSIUM: 4 meq/L (ref 3.5–5.1)
SODIUM: 140 meq/L (ref 135–145)
Total Bilirubin: 0.4 mg/dL (ref 0.2–1.2)
Total Protein: 7 g/dL (ref 6.0–8.3)

## 2015-10-15 LAB — LIPID PANEL
CHOL/HDL RATIO: 3
CHOLESTEROL: 183 mg/dL (ref 0–200)
HDL: 56.7 mg/dL (ref >39.00)
LDL CALC: 97 mg/dL (ref 0–99)
NonHDL: 126.03
TRIGLYCERIDES: 145 mg/dL (ref 0.0–149.0)
VLDL: 29 mg/dL (ref 0.0–40.0)

## 2015-10-15 LAB — TSH: TSH: 1.86 u[IU]/mL (ref 0.35–4.50)

## 2015-10-15 NOTE — Patient Instructions (Signed)
Connie Rowe , Thank you for taking time to come for your Medicare Wellness Visit. I appreciate your ongoing commitment to your health goals. Please review the following plan we discussed and let me know if I can assist you in the future.   These are the goals we discussed: Goals    . Increase physical activity          Starting 10/15/2015, I will continue to exercise for at least 60 min 3 days per week.        This is a list of the screening recommended for you and due dates:  Health Maintenance  Topic Date Due  . Mammogram  01/30/2016*  . Tetanus Vaccine  10/03/2016  . Colon Cancer Screening  02/14/2025  . Flu Shot  Completed  . DEXA scan (bone density measurement)  Completed  . Shingles Vaccine  Completed  .  Hepatitis C: One time screening is recommended by Center for Disease Control  (CDC) for  adults born from [birth year] through 1967.   Completed  . Pneumonia vaccines  Completed  *Topic was postponed. The date shown is not the original due date.   Preventive Care for Adults  A healthy lifestyle and preventive care can promote health and wellness. Preventive health guidelines for adults include the following key practices.  . A routine yearly physical is a good way to check with your health care provider about your health and preventive screening. It is a chance to share any concerns and updates on your health and to receive a thorough exam.  . Visit your dentist for a routine exam and preventive care every 6 months. Brush your teeth twice a day and floss once a day. Good oral hygiene prevents tooth decay and gum disease.  . The frequency of eye exams is based on your age, health, family medical history, use  of contact lenses, and other factors. Follow your health care provider's ecommendations for frequency of eye exams.  . Eat a healthy diet. Foods like vegetables, fruits, whole grains, low-fat dairy products, and lean protein foods contain the nutrients you need without too many  calories. Decrease your intake of foods high in solid fats, added sugars, and salt. Eat the right amount of calories for you. Get information about a proper diet from your health care provider, if necessary.  . Regular physical exercise is one of the most important things you can do for your health. Most adults should get at least 150 minutes of moderate-intensity exercise (any activity that increases your heart rate and causes you to sweat) each week. In addition, most adults need muscle-strengthening exercises on 2 or more days a week.  Silver Sneakers may be a benefit available to you. To determine eligibility, you may visit the website: www.silversneakers.com or contact program at 8140774051 Mon-Fri between 8AM-8PM.   . Maintain a healthy weight. The body mass index (BMI) is a screening tool to identify possible weight problems. It provides an estimate of body fat based on height and weight. Your health care provider can find your BMI and can help you achieve or maintain a healthy weight.   For adults 20 years and older: ? A BMI below 18.5 is considered underweight. ? A BMI of 18.5 to 24.9 is normal. ? A BMI of 25 to 29.9 is considered overweight. ? A BMI of 30 and above is considered obese.   . Maintain normal blood lipids and cholesterol levels by exercising and minimizing your intake of saturated fat. Eat  a balanced diet with plenty of fruit and vegetables. Blood tests for lipids and cholesterol should begin at age 44 and be repeated every 5 years. If your lipid or cholesterol levels are high, you are over 50, or you are at high risk for heart disease, you may need your cholesterol levels checked more frequently. Ongoing high lipid and cholesterol levels should be treated with medicines if diet and exercise are not working.  . If you smoke, find out from your health care provider how to quit. If you do not use tobacco, please do not start.  . If you choose to drink alcohol, please do  not consume more than 2 drinks per day. One drink is considered to be 12 ounces (355 mL) of beer, 5 ounces (148 mL) of wine, or 1.5 ounces (44 mL) of liquor.  . If you are 61-41 years old, ask your health care provider if you should take aspirin to prevent strokes.  . Use sunscreen. Apply sunscreen liberally and repeatedly throughout the day. You should seek shade when your shadow is shorter than you. Protect yourself by wearing long sleeves, pants, a wide-brimmed hat, and sunglasses year round, whenever you are outdoors.  . Once a month, do a whole body skin exam, using a mirror to look at the skin on your back. Tell your health care provider of new moles, moles that have irregular borders, moles that are larger than a pencil eraser, or moles that have changed in shape or color.

## 2015-10-15 NOTE — Progress Notes (Signed)
PCP notes:   Health maintenance:  Mammogram - order generated; appt will be scheduled Flu vaccine - administered by clinical team lead [id number]- admininstered by clinical team lead  Abnormal screenings:   None  Patient concerns:   None  Nurse concerns:  None  Next PCP appt:   10/19/2015 @ 0900  I reviewed health advisor's note, was available for consultation, and agree with documentation and plan. Loura Pardon MD

## 2015-10-15 NOTE — Progress Notes (Signed)
Subjective:   Connie Rowe is a 65 y.o. female who presents for Medicare Annual (Subsequent) preventive examination.  Review of Systems:  N/A Cardiac Risk Factors include: advanced age (>70men, >50 women);hypertension;dyslipidemia     Objective:     Vitals: BP 118/88 (BP Location: Right Arm, Patient Position: Sitting, Cuff Size: Normal)   Pulse 73   Temp 98.4 F (36.9 C) (Oral)   Ht 5\' 4"  (1.626 m) Comment: no shoes  Wt 171 lb 12 oz (77.9 kg)   SpO2 96%   BMI 29.48 kg/m   Body mass index is 29.48 kg/m.   Tobacco History  Smoking Status  . Never Smoker  Smokeless Tobacco  . Never Used     Counseling given: No   Past Medical History:  Diagnosis Date  . Arthritis   . Colon polyps   . Depression   . Esophageal stricture   . Falls   . GERD (gastroesophageal reflux disease)   . H/O fracture of humerus 2006  . History of bronchitis   . History of hiatal hernia   . Hyperlipidemia   . Hypertension   . Nocturia   . Osteopenia   . Pancreatitis    History of   Past Surgical History:  Procedure Laterality Date  . BLADDER REPAIR    . COLONOSCOPY    . DIAGNOSTIC LAPAROSCOPY     tubal ligation  . DILATION AND CURETTAGE OF UTERUS    . ESOPHAGEAL DILATION    . LUMBAR LAMINECTOMY/DECOMPRESSION MICRODISCECTOMY Right 09/23/2014   Procedure: Laminectomy and Foraminotomy - Lumbar three- lumbar four - right ;  Surgeon: Eustace Moore, MD;  Location: [facility];  Service: Neurosurgery;  Laterality: Right;  . MULTIPLE TOOTH EXTRACTIONS    . TONSILLECTOMY    . TOTAL HIP ARTHROPLASTY Right 03/30/2015   Procedure: RIGHT TOTAL HIP ARTHROPLASTY ANTERIOR APPROACH;  Surgeon: Paralee Cancel, MD;  Location: [facility] ORS;  Service: Orthopedics;  Laterality: Right;  . TUBAL LIGATION    . UPPER GI ENDOSCOPY     Family History  Problem Relation Age of Onset  . Stroke Mother   . Hypertension Mother   . Cancer Mother     colon CA  . Colon cancer Mother   . Drug abuse Son   . Stroke  Brother   . Colon polyps Neg Hx   . Crohn's disease Neg Hx   . Pancreatic cancer Neg Hx   . Rectal cancer Neg Hx   . Stomach cancer Neg Hx   . Ulcerative colitis Neg Hx    History  Sexual Activity  . Sexual activity: Yes    Outpatient Encounter Prescriptions as of 10/15/2015  Medication Sig  . Calcium Carbonate-Vitamin D (CALCIUM-VITAMIN D) 500-200 MG-UNIT per tablet Take 1 tablet by mouth daily.    . diphenhydrAMINE (SOMINEX) 25 MG tablet Take 25 mg by mouth at bedtime as needed for sleep.  Marland Kitchen docusate sodium (COLACE) 100 MG capsule Take 1 capsule (100 mg total) by mouth 2 (two) times daily.  . ferrous sulfate 325 (65 FE) MG tablet Take 1 tablet (325 mg total) by mouth 3 (three) times daily after meals.  Marland Kitchen FLUoxetine (PROZAC) 20 MG capsule Take 1 capsule (20 mg total) by mouth daily. Needs to schedule physical for future refills  . hydrochlorothiazide (MICROZIDE) 12.5 MG capsule TAKE 1 CAPSULE BY MOUTH ONCE DAILY  . HYDROcodone-acetaminophen (NORCO) 7.5-325 MG tablet Take 1-2 tablets by mouth every 4 (four) hours as needed for moderate pain.  Marland Kitchen  pantoprazole (PROTONIX) 40 MG tablet Take 1 tablet (40 mg total) by mouth 2 (two) times daily. Needs to schedule physical for future refills  . polyethylene glycol (MIRALAX / GLYCOLAX) packet Take 17 g by mouth 2 (two) times daily.  . pravastatin (PRAVACHOL) 80 MG tablet Take 1 tablet (80 mg total) by mouth daily. Needs to schedule physical for future refills  . tiZANidine (ZANAFLEX) 4 MG tablet Take 1 tablet (4 mg total) by mouth every 6 (six) hours as needed for muscle spasms.   No facility-administered encounter medications on file as of 10/15/2015.     Activities of Daily Living In your present state of health, do you have any difficulty performing the following activities: 10/15/2015 03/30/2015  Hearing? N N  Vision? N N  Difficulty concentrating or making decisions? N N  Walking or climbing stairs? N Y  Dressing or bathing? N N  Doing  errands, shopping? N N  Preparing Food and eating ? N -  Using the Toilet? N -  In the past six months, have you accidently leaked urine? N -  Do you have problems with loss of bowel control? N -  Managing your Medications? N -  Managing your Finances? N -  Housekeeping or managing your Housekeeping? N -  Some recent data might be hidden    Patient Care Team: Abner Greenspan, MD as PCP - General    Assessment:     Visual Acuity Screening   Right eye Left eye Both eyes  Without correction: 20/20 20/20 20/20   With correction:        Exercise Activities and Dietary recommendations Current Exercise Habits: Home exercise routine, Type of exercise: stretching;strength training/weights, Time (Minutes): 60, Frequency (Times/Week): 3, Weekly Exercise (Minutes/Week): 180, Intensity: Moderate, Exercise limited by: None identified  Goals    . Increase physical activity          Starting 10/15/2015, I will continue to exercise for at least 60 min 3 days per week.       Fall Risk Fall Risk  10/15/2015 06/03/2014  Falls in the past year? No Yes  Number falls in past yr: - 1  Injury with Fall? - No   Depression Screen PHQ 2/9 Scores 10/15/2015 06/03/2014  PHQ - 2 Score 0 0     Cognitive Testing MMSE - Mini Mental State Exam 10/15/2015  Orientation to time 5  Orientation to Place 5  Registration 3  Attention/ Calculation 0  Recall 3  Language- name 2 objects 0  Language- repeat 1  Language- follow 3 step command 3  Language- read & follow direction 0  Write a sentence 0  Copy design 0  Total score 20   PLEASE NOTE: A Mini-Cog screen was completed. Maximum score is 20. A value of 0 denotes this part of Folstein MMSE was not completed or the patient failed this part of the Mini-Cog screening.   Mini-Cog Screening Orientation to Time - Max 5 pts Orientation to Place - Max 5 pts Registration - Max 3 pts Recall - Max 3 pts Language Repeat - Max 1 pts Language Follow 3 Step Command  - Max 3 pts  Immunization History  Administered Date(s) Administered  . Influenza Split 11/17/2010, 12/14/2011  . Influenza Whole 12/07/2006, 11/05/2007, 11/10/2008, 01/05/2010  . Influenza,inj,Quad PF,36+ Mos 12/17/2012, 12/16/2013, 12/31/2014  . Pneumococcal Conjugate-13 06/03/2014  . Td 10/01/1995, 10/04/2006  . Zoster 09/29/2013   Screening Tests Health Maintenance  Topic Date Due  . MAMMOGRAM  01/30/2016 (Originally 03/06/2014)  . TETANUS/TDAP  10/03/2016  . COLONOSCOPY  02/14/2025  . INFLUENZA VACCINE  Completed  . DEXA SCAN  Completed  . ZOSTAVAX  Completed  . Hepatitis C Screening  Completed  . PNA vac Low Risk Adult  Completed      Plan:     I have personally reviewed and addressed the Medicare Annual Wellness questionnaire and have noted the following in the patient's chart:  A. Medical and social history B. Use of alcohol, tobacco or illicit drugs  C. Current medications and supplements D. Functional ability and status E.  Nutritional status F.  Physical activity G. Advance directives H. List of other physicians I.  Hospitalizations, surgeries, and ER visits in previous 12 months J.  [place] to include hearing, vision, cognitive, depression L. Referrals and appointments - none  In addition, I have reviewed and discussed with patient certain preventive protocols, quality metrics, and best practice recommendations. A written personalized care plan for preventive services as well as general preventive health recommendations were provided to patient.  See attached scanned questionnaire for additional information.   Signed,   Lindell Noe, MHA, BS, LPN Health Advisor

## 2015-10-15 NOTE — Progress Notes (Signed)
Pre visit review using our clinic review tool, if applicable. No additional management support is needed unless otherwise documented below in the visit note. 

## 2015-10-16 LAB — HEPATITIS C ANTIBODY: HCV AB: NEGATIVE

## 2015-10-17 ENCOUNTER — Telehealth: Payer: Self-pay | Admitting: Family Medicine

## 2015-10-17 MED ORDER — HYDROCHLOROTHIAZIDE 12.5 MG PO CAPS: 12.5000 mg | ORAL_CAPSULE | Freq: Every day | ORAL | 3 refills | Status: DC

## 2015-10-17 NOTE — Telephone Encounter (Signed)
Done

## 2015-10-17 NOTE — Telephone Encounter (Signed)
-----   Message from Connie Pen, LPN sent at [phone]  3:45 PM EDT ----- Regarding: Medication Refills (Future) Pt is requesting that future refills for HCTZ 12.5 mg are set up for 90 days instead of 30 days.

## 2015-10-19 ENCOUNTER — Ambulatory Visit (INDEPENDENT_AMBULATORY_CARE_PROVIDER_SITE_OTHER): Payer: Commercial Managed Care - HMO | Admitting: Family Medicine

## 2015-10-19 ENCOUNTER — Encounter: Payer: Self-pay | Admitting: Family Medicine

## 2015-10-19 VITALS — BP 112/70 | HR 82 | Temp 98.5°F | Ht 64.0 in | Wt 171.8 lb

## 2015-10-19 DIAGNOSIS — M899 Disorder of bone, unspecified: Secondary | ICD-10-CM

## 2015-10-19 DIAGNOSIS — E785 Hyperlipidemia, unspecified: Secondary | ICD-10-CM

## 2015-10-19 DIAGNOSIS — R739 Hyperglycemia, unspecified: Secondary | ICD-10-CM | POA: Diagnosis not present

## 2015-10-19 DIAGNOSIS — I10 Essential (primary) hypertension: Secondary | ICD-10-CM | POA: Diagnosis not present

## 2015-10-19 DIAGNOSIS — Z0000 Encounter for general adult medical examination without abnormal findings: Secondary | ICD-10-CM | POA: Diagnosis not present

## 2015-10-19 DIAGNOSIS — E2839 Other primary ovarian failure: Secondary | ICD-10-CM

## 2015-10-19 DIAGNOSIS — M949 Disorder of cartilage, unspecified: Secondary | ICD-10-CM

## 2015-10-19 MED ORDER — FLUOXETINE HCL 20 MG PO CAPS: 20.0000 mg | ORAL_CAPSULE | Freq: Every day | ORAL | 3 refills | Status: DC

## 2015-10-19 MED ORDER — PANTOPRAZOLE SODIUM 40 MG PO TBEC: 40.0000 mg | DELAYED_RELEASE_TABLET | Freq: Two times a day (BID) | ORAL | 3 refills | Status: DC

## 2015-10-19 MED ORDER — PRAVASTATIN SODIUM 80 MG PO TABS: 80.0000 mg | ORAL_TABLET | Freq: Every day | ORAL | 3 refills | Status: DC

## 2015-10-19 NOTE — Patient Instructions (Addendum)
Don't forget to schedule your annual mammogram at [facility] -here is the phone number (make the appt for around a month out)  Stop at check out for referral for bone density test  Keep watching sugar and carbs in diet to prevent diabetes

## 2015-10-19 NOTE — Progress Notes (Signed)
Subjective:    Patient ID: Connie Rowe, female    DOB: 06-07-1950, 65 y.o.   MRN: [id number]  HPI Here for health maintenance exam and to review chronic medical problems    In general feeling pretty good  Had her hip fixed in feb- went very well! Still caring for brother with a stroke   Saw Lesia for AMW 10/15/15 Got her flu and PNA 23 vaccine  Mammogram was ordered -she will call to make the appt  Mammogram last was 2/15   She tripped in the middle of the night in her bathroom last week- she bruised her breast L  Going to have to put off her mammogram  Does not have a date yet   Wt Readings from Last 3 Encounters:  10/19/15 171 lb 12 oz (77.9 kg)  10/15/15 171 lb 12 oz (77.9 kg)  03/30/15 152 lb (68.9 kg)   bmi of 29.48  Family hx of colon cancer mother   Colonoscopy 1/17 -one polyp  5 year recall due to family history   dexa 11/12- very mild osteopenia in [place]  Had one fall - did not break anything - accidental - tripped over a stool in the middle of the night/dark  [place] times [place] for exercise  Taking ca and D  Is open to getting another one   Up to date on shots   bp is stable today  No cp or palpitations or headaches or edema  No side effects to medicines  BP Readings from Last 3 Encounters:  10/19/15 112/70  10/15/15 118/88  03/31/15 (!) 126/53     Hx of hyperglycemia Glucose was 107 fasting Lab Results  Component Value Date   HGBA1C 5.7 06/03/2014    Hx of hyperlipidemia Lab Results  Component Value Date   CHOL 183 10/15/2015   CHOL 240 (H) 06/03/2014   CHOL 192 04/15/2013   Lab Results  Component Value Date   HDL 56.70 10/15/2015   HDL 72.30 06/03/2014   HDL 61.70 04/15/2013   Lab Results  Component Value Date   LDLCALC 97 10/15/2015   LDLCALC 96 04/15/2013   LDLCALC 103 (H) 01/16/2012   Lab Results  Component Value Date   TRIG 145.0 10/15/2015   TRIG 228.0 (H) 06/03/2014   TRIG 170.0 (H) 04/15/2013   Lab  Results  Component Value Date   CHOLHDL 3 10/15/2015   CHOLHDL 3 06/03/2014   CHOLHDL 3 04/15/2013   Lab Results  Component Value Date   LDLDIRECT 126.0 06/03/2014   LDLDIRECT 122.4 09/14/2010   LDLDIRECT 132.2 04/16/2008   Pravastatin and diet  Was not able to exercise when hip was healing - HDL is lower   Lab Results  Component Value Date   WBC 5.0 10/15/2015   HGB 12.8 10/15/2015   HCT 38.4 10/15/2015   MCV 85.5 10/15/2015   PLT 252.0 10/15/2015      Chemistry      Component Value Date/Time   NA 140 10/15/2015 0841   K 4.0 10/15/2015 0841   CL 102 10/15/2015 0841   CO2 31 10/15/2015 0841   BUN 14 10/15/2015 0841   CREATININE 1.19 10/15/2015 0841      Component Value Date/Time   CALCIUM 8.9 10/15/2015 0841   ALKPHOS 70 10/15/2015 0841   AST 16 10/15/2015 0841   ALT 14 10/15/2015 0841   BILITOT 0.4 10/15/2015 0841       No gyn problems/ does not  see gyn  No hx of abn pap tests    Patient Active Problem List   Diagnosis Date Noted  . Estrogen deficiency 10/19/2015  . Need for hepatitis C screening test 09/27/2015  . S/P right THA, AA 03/30/2015  . Pre-operative examination 02/24/2015  . S/P lumbar laminectomy 09/23/2014  . Essential hypertension 06/26/2014  . Encounter for Medicare annual wellness exam 06/03/2014  . Colon cancer screening 06/03/2014  . Right hip pain 02/10/2014  . Sciatica 11/26/2013  . Hyperglycemia 04/17/2013  . Hirsutism 12/06/2010  . Routine general medical examination at a health care facility 09/10/2010  . Other screening mammogram 09/01/2010  . Disorder of bone and cartilage 10/22/2006  . URINARY INCONTINENCE, STRESS 08/22/2006  . History of Helicobacter pylori infection 04/18/2006  . Hyperlipidemia 04/18/2006  . DEPRESSION 04/18/2006  . EXTERNAL HEMORRHOIDS 04/18/2006  . GERD 04/18/2006  . HIATAL HERNIA 04/18/2006  . OVERACTIVE BLADDER 04/18/2006  . HEMATURIA, MICROSCOPIC 04/18/2006  . GAMMA GLUTAMYL TRANSFERASE, SERUM,  ELEVATED 04/18/2006  . PANCREATITIS, HX OF 04/18/2006  . DIVERTICULOSIS, COLON 01/02/2000   Past Medical History:  Diagnosis Date  . Arthritis   . Colon polyps   . Depression   . Esophageal stricture   . Falls   . GERD (gastroesophageal reflux disease)   . H/O fracture of humerus 2006  . History of bronchitis   . History of hiatal hernia   . Hyperlipidemia   . Hypertension   . Nocturia   . Osteopenia   . Pancreatitis    History of   Past Surgical History:  Procedure Laterality Date  . BLADDER REPAIR    . COLONOSCOPY    . DIAGNOSTIC LAPAROSCOPY     tubal ligation  . DILATION AND CURETTAGE OF UTERUS    . ESOPHAGEAL DILATION    . LUMBAR LAMINECTOMY/DECOMPRESSION MICRODISCECTOMY Right 09/23/2014   Procedure: Laminectomy and Foraminotomy - Lumbar three- lumbar four - right ;  Surgeon: Eustace Moore, MD;  Location: [facility];  Service: Neurosurgery;  Laterality: Right;  . MULTIPLE TOOTH EXTRACTIONS    . TONSILLECTOMY    . TOTAL HIP ARTHROPLASTY Right 03/30/2015   Procedure: RIGHT TOTAL HIP ARTHROPLASTY ANTERIOR APPROACH;  Surgeon: Paralee Cancel, MD;  Location: [facility];  Service: Orthopedics;  Laterality: Right;  . TUBAL LIGATION    . UPPER GI ENDOSCOPY     Social History  Substance Use Topics  . Smoking status: Never Smoker  . Smokeless tobacco: Never Used  . Alcohol use No   Family History  Problem Relation Age of Onset  . Stroke Mother   . Hypertension Mother   . Cancer Mother     colon CA  . Colon cancer Mother   . Drug abuse Son   . Stroke Brother   . Colon polyps Neg Hx   . Crohn's disease Neg Hx   . Pancreatic cancer Neg Hx   . Rectal cancer Neg Hx   . Stomach cancer Neg Hx   . Ulcerative colitis Neg Hx    Allergies  Allergen Reactions  . Ace Inhibitors Cough   Current Outpatient Prescriptions on File Prior to Visit  Medication Sig Dispense Refill  . Calcium Carbonate-Vitamin D (CALCIUM-VITAMIN D) 500-200 MG-UNIT per tablet Take 1 tablet by mouth  daily.      . diphenhydrAMINE (SOMINEX) 25 MG tablet Take 25 mg by mouth at bedtime as needed for sleep.    Marland Kitchen docusate sodium (COLACE) 100 MG capsule Take 1 capsule (100 mg total) by  mouth 2 (two) times daily. 10 capsule 0  . ferrous sulfate 325 (65 FE) MG tablet Take 1 tablet (325 mg total) by mouth 3 (three) times daily after meals.  3  . hydrochlorothiazide (MICROZIDE) 12.5 MG capsule Take 1 capsule (12.5 mg total) by mouth daily. 90 capsule 3  . HYDROcodone-acetaminophen (NORCO) 7.5-325 MG tablet Take 1-2 tablets by mouth every 4 (four) hours as needed for moderate pain. 100 tablet 0  . polyethylene glycol (MIRALAX / GLYCOLAX) packet Take 17 g by mouth 2 (two) times daily. 14 each 0  . tiZANidine (ZANAFLEX) 4 MG tablet Take 1 tablet (4 mg total) by mouth every 6 (six) hours as needed for muscle spasms. 40 tablet 0   No current facility-administered medications on file prior to visit.     Review of Systems Review of Systems  Constitutional: Negative for fever, appetite change, fatigue and unexpected weight change.  Eyes: Negative for pain and visual disturbance.  Respiratory: Negative for cough and shortness of breath.   Cardiovascular: Negative for cp or palpitations    Gastrointestinal: Negative for nausea, diarrhea and constipation.  Genitourinary: Negative for urgency and frequency.  Skin: Negative for pallor or rash   Neurological: Negative for weakness, light-headedness, numbness and headaches.  Hematological: Negative for adenopathy. Does not bruise/bleed easily.  Psychiatric/Behavioral: Negative for dysphoric mood. The patient is not nervous/anxious.         Objective:   Physical Exam  Constitutional: She appears well-developed and well-nourished. No distress.  overwt and well appearing   HENT:  Head: Normocephalic and atraumatic.  Right Ear: External ear normal.  Left Ear: External ear normal.  Mouth/Throat: Oropharynx is clear and moist.  Eyes: Conjunctivae and EOM are  normal. Pupils are equal, round, and reactive to light. No scleral icterus.  Neck: Normal range of motion. Neck supple. No JVD present. Carotid bruit is not present. No thyromegaly present.  Cardiovascular: Normal rate, regular rhythm, normal heart sounds and intact distal pulses.  Exam reveals no gallop.   Pulmonary/Chest: Effort normal and breath sounds normal. No respiratory distress. She has no wheezes. She exhibits no tenderness.  Abdominal: Soft. Bowel sounds are normal. She exhibits no distension, no abdominal bruit and no mass. There is no tenderness.  Genitourinary: No breast swelling, tenderness, discharge or bleeding.  Genitourinary Comments: Breast exam: No mass, nodules, thickening, tenderness, bulging, retraction, inflamation, nipple discharge or skin changes noted.  No axillary or clavicular LA.      Musculoskeletal: Normal range of motion. She exhibits no edema or tenderness.  Lymphadenopathy:    She has no cervical adenopathy.  Neurological: She is alert. She has normal reflexes. No cranial nerve deficit. She exhibits normal muscle tone. Coordination normal.  Skin: Skin is warm and dry. No rash noted. No erythema. No pallor.  Psychiatric: She has a normal mood and affect.          Assessment & Plan:   Problem List Items Addressed This Visit      Cardiovascular and Mediastinum   Essential hypertension    bp in fair control at this time  BP Readings from Last 1 Encounters:  10/19/15 112/70   No changes needed Disc lifstyle change with low sodium diet and exercise  Labs reviewed       Relevant Medications   pravastatin (PRAVACHOL) 80 MG tablet     Musculoskeletal and Integument   Disorder of bone and cartilage    Due for dexa-ordered  Disc need for calcium/ vitamin D/  wt bearing exercise and bone density test every 2 y to monitor Disc safety/ fracture risk in detail          Other   Routine general medical examination at a health care facility - Primary     Reviewed health habits including diet and exercise and skin cancer prevention Reviewed appropriate screening tests for age  Also reviewed health mt list, fam hx and immunization status , as well as social and family history   See HPI Labs reviewed AMW reviewed dexa ordered Don't forget to schedule your annual mammogram at [facility] -here is the phone number (make the appt for around a month out)  Stop at check out for referral for bone density test  Keep watching sugar and carbs in diet to prevent diabetes       Hyperlipidemia    Disc goals for lipids and reasons to control them Rev labs with pt Rev low sat fat diet in detail Expect her HDL to go up now that she can exercise again       Relevant Medications   pravastatin (PRAVACHOL) 80 MG tablet   Hyperglycemia    Lab Results  Component Value Date   HGBA1C 5.7 06/03/2014   Doing well with low glycemic diet and exercise to control       Estrogen deficiency    Ref for dexa       Relevant Orders   DG Bone Density    Other Visit Diagnoses   None.

## 2015-10-19 NOTE — Progress Notes (Signed)
Pre visit review using our clinic review tool, if applicable. No additional management support is needed unless otherwise documented below in the visit note. 

## 2015-10-21 NOTE — Assessment & Plan Note (Signed)
Disc goals for lipids and reasons to control them Rev labs with pt Rev low sat fat diet in detail Expect her HDL to go up now that she can exercise again

## 2015-10-21 NOTE — Assessment & Plan Note (Signed)
bp in fair control at this time  BP Readings from Last 1 Encounters:  10/19/15 112/70   No changes needed Disc lifstyle change with low sodium diet and exercise  Labs reviewed

## 2015-10-21 NOTE — Assessment & Plan Note (Signed)
Reviewed health habits including diet and exercise and skin cancer prevention Reviewed appropriate screening tests for age  Also reviewed health mt list, fam hx and immunization status , as well as social and family history   See HPI Labs reviewed AMW reviewed dexa ordered Don't forget to schedule your annual mammogram at [facility] -here is the phone number (make the appt for around a month out)  Stop at check out for referral for bone density test  Keep watching sugar and carbs in diet to prevent diabetes

## 2015-10-21 NOTE — Assessment & Plan Note (Signed)
Lab Results  Component Value Date   HGBA1C 5.7 06/03/2014   Doing well with low glycemic diet and exercise to control

## 2015-10-21 NOTE — Assessment & Plan Note (Signed)
Due for dexa-ordered  Disc need for calcium/ vitamin D/ wt bearing exercise and bone density test every 2 y to monitor Disc safety/ fracture risk in detail

## 2015-10-21 NOTE — Assessment & Plan Note (Signed)
Ref for dexa 

## 2015-11-08 ENCOUNTER — Other Ambulatory Visit: Payer: Commercial Managed Care - HMO

## 2016-01-05 ENCOUNTER — Emergency Department: Payer: Commercial Managed Care - HMO

## 2016-01-05 ENCOUNTER — Telehealth: Payer: Self-pay | Admitting: Family Medicine

## 2016-01-05 ENCOUNTER — Emergency Department
Admission: EM | Admit: 2016-01-05 | Discharge: 2016-01-05 | Disposition: A | Discharge status: Home / Self Care | Payer: Commercial Managed Care - HMO | Attending: Emergency Medicine | Admitting: Emergency Medicine

## 2016-01-05 DIAGNOSIS — I10 Essential (primary) hypertension: Secondary | ICD-10-CM | POA: Diagnosis not present

## 2016-01-05 DIAGNOSIS — Y9301 Activity, walking, marching and hiking: Secondary | ICD-10-CM | POA: Diagnosis not present

## 2016-01-05 DIAGNOSIS — W010XXA Fall on same level from slipping, tripping and stumbling without subsequent striking against object, initial encounter: Secondary | ICD-10-CM | POA: Diagnosis not present

## 2016-01-05 DIAGNOSIS — Z79899 Other long term (current) drug therapy: Secondary | ICD-10-CM | POA: Insufficient documentation

## 2016-01-05 DIAGNOSIS — S52121A Displaced fracture of head of right radius, initial encounter for closed fracture: Secondary | ICD-10-CM | POA: Insufficient documentation

## 2016-01-05 DIAGNOSIS — Y929 Unspecified place or not applicable: Secondary | ICD-10-CM | POA: Insufficient documentation

## 2016-01-05 DIAGNOSIS — Y999 Unspecified external cause status: Secondary | ICD-10-CM | POA: Insufficient documentation

## 2016-01-05 DIAGNOSIS — S59901A Unspecified injury of right elbow, initial encounter: Secondary | ICD-10-CM | POA: Diagnosis present

## 2016-01-05 MED ORDER — HYDROCODONE-ACETAMINOPHEN 5-325 MG PO TABS: 1.0000 | ORAL_TABLET | ORAL | 0 refills | Status: DC | PRN

## 2016-01-05 NOTE — ED Provider Notes (Signed)
[facility] Emergency Department Provider Note  ____________________________________________  Time seen: Approximately 4:35 PM  I have reviewed the triage vital signs and the nursing notes.   HISTORY  Chief Complaint Arm Pain    HPI Connie Rowe is a 65 y.o. female who presents to emergency department complaining of right elbow pain. Patient states that she was out walking her dog when she had a mechanical fall and landed on the right elbow. Patient denied her head or lose consciousness. She reporting pain to the lateral proximal elbow. She denies any swelling or bruising. Patient denies any loss of range of motion. She denies any numbness or tingling of fingers. No other complaints. Patient did take Motrin prior to arrival.   Past Medical History:  Diagnosis Date  . Arthritis   . Colon polyps   . Depression   . Esophageal stricture   . Falls   . GERD (gastroesophageal reflux disease)   . H/O fracture of humerus 2006  . History of bronchitis   . History of hiatal hernia   . Hyperlipidemia   . Hypertension   . Nocturia   . Osteopenia   . Pancreatitis    History of    Patient Active Problem List   Diagnosis Date Noted  . Estrogen deficiency 10/19/2015  . Need for hepatitis C screening test 09/27/2015  . S/P right THA, AA 03/30/2015  . Pre-operative examination 02/24/2015  . S/P lumbar laminectomy 09/23/2014  . Essential hypertension 06/26/2014  . Encounter for Medicare annual wellness exam 06/03/2014  . Colon cancer screening 06/03/2014  . Right hip pain 02/10/2014  . Sciatica 11/26/2013  . Hyperglycemia 04/17/2013  . Hirsutism 12/06/2010  . Routine general medical examination at a health care facility 09/10/2010  . Other screening mammogram 09/01/2010  . Disorder of bone and cartilage 10/22/2006  . URINARY INCONTINENCE, STRESS 08/22/2006  . History of Helicobacter pylori infection 04/18/2006  . Hyperlipidemia 04/18/2006  . DEPRESSION  04/18/2006  . EXTERNAL HEMORRHOIDS 04/18/2006  . GERD 04/18/2006  . HIATAL HERNIA 04/18/2006  . OVERACTIVE BLADDER 04/18/2006  . HEMATURIA, MICROSCOPIC 04/18/2006  . GAMMA GLUTAMYL TRANSFERASE, SERUM, ELEVATED 04/18/2006  . PANCREATITIS, HX OF 04/18/2006  . DIVERTICULOSIS, COLON 01/02/2000    Past Surgical History:  Procedure Laterality Date  . BLADDER REPAIR    . COLONOSCOPY    . DIAGNOSTIC LAPAROSCOPY     tubal ligation  . DILATION AND CURETTAGE OF UTERUS    . ESOPHAGEAL DILATION    . LUMBAR LAMINECTOMY/DECOMPRESSION MICRODISCECTOMY Right 09/23/2014   Procedure: Laminectomy and Foraminotomy - Lumbar three- lumbar four - right ;  Surgeon: Eustace Moore, MD;  Location: [place];  Service: Neurosurgery;  Laterality: Right;  . MULTIPLE TOOTH EXTRACTIONS    . TONSILLECTOMY    . TOTAL HIP ARTHROPLASTY Right 03/30/2015   Procedure: RIGHT TOTAL HIP ARTHROPLASTY ANTERIOR APPROACH;  Surgeon: Paralee Cancel, MD;  Location: [facility];  Service: Orthopedics;  Laterality: Right;  . TUBAL LIGATION    . UPPER GI ENDOSCOPY      Prior to Admission medications   Medication Sig Start Date End Date Taking? Authorizing Provider  Calcium Carbonate-Vitamin D (CALCIUM-VITAMIN D) 500-200 MG-UNIT per tablet Take 1 tablet by mouth daily.      Historical Provider, MD  diphenhydrAMINE (SOMINEX) 25 MG tablet Take 25 mg by mouth at bedtime as needed for sleep.    Historical Provider, MD  docusate sodium (COLACE) 100 MG capsule Take 1 capsule (100 mg total) by  mouth 2 (two) times daily. 03/31/15   Danae Orleans, PA-C  ferrous sulfate 325 (65 FE) MG tablet Take 1 tablet (325 mg total) by mouth 3 (three) times daily after meals. 03/31/15   Danae Orleans, PA-C  FLUoxetine (PROZAC) 20 MG capsule Take 1 capsule (20 mg total) by mouth daily. Needs to schedule physical for future refills 10/19/15   Abner Greenspan, MD  hydrochlorothiazide (MICROZIDE) 12.5 MG capsule Take 1 capsule (12.5 mg total) by mouth daily. 10/17/15    Abner Greenspan, MD  HYDROcodone-acetaminophen (NORCO/VICODIN) 5-325 MG tablet Take 1 tablet by mouth every 4 (four) hours as needed for moderate pain. 01/05/16   Charline Bills , PA-C  pantoprazole (PROTONIX) 40 MG tablet Take 1 tablet (40 mg total) by mouth 2 (two) times daily. Needs to schedule physical for future refills 10/19/15   Abner Greenspan, MD  polyethylene glycol [facility] / GLYCOLAX) packet Take 17 g by mouth 2 (two) times daily. 03/31/15   Danae Orleans, PA-C  pravastatin (PRAVACHOL) 80 MG tablet Take 1 tablet (80 mg total) by mouth daily. Needs to schedule physical for future refills 10/19/15   Abner Greenspan, MD  tiZANidine (ZANAFLEX) 4 MG tablet Take 1 tablet (4 mg total) by mouth every 6 (six) hours as needed for muscle spasms. 03/31/15   Danae Orleans, PA-C    Allergies Ace inhibitors  Family History  Problem Relation Age of Onset  . Stroke Mother   . Hypertension Mother   . Cancer Mother     colon CA  . Colon cancer Mother   . Drug abuse Son   . Stroke Brother   . Colon polyps Neg Hx   . Crohn's disease Neg Hx   . Pancreatic cancer Neg Hx   . Rectal cancer Neg Hx   . Stomach cancer Neg Hx   . Ulcerative colitis Neg Hx     Social History Social History  Substance Use Topics  . Smoking status: Never Smoker  . Smokeless tobacco: Never Used  . Alcohol use No     Review of Systems  Constitutional: No fever/chills Cardiovascular: no chest pain. Respiratory: no cough. No SOB. Musculoskeletal:Positive right elbow pain Skin: Negative for rash, abrasions, lacerations, ecchymosis. Neurological: Negative for headaches, focal weakness or numbness. 10-point ROS otherwise negative.  ____________________________________________   PHYSICAL EXAM:  VITAL SIGNS: ED Triage Vitals  Enc Vitals Group     BP 01/05/16 1532 (!) 146/82     Pulse Rate 01/05/16 1532 82     Resp 01/05/16 1532 18     Temp 01/05/16 1532 98 F (36.7 C)     Temp Source 01/05/16 1532 Oral      SpO2 01/05/16 1532 95 %     Weight 01/05/16 1531 160 lb (72.6 kg)     Height 01/05/16 1531 5\' 5"  (1.651 m)     Head Circumference --      Peak Flow --      Pain Score --      Pain Loc --      Pain Edu? --      Excl. in [place]? --      Constitutional: Alert and oriented. Well appearing and in no acute distress. Eyes: Conjunctivae are normal. PERRL. EOMI. Head: Atraumatic. Neck: No stridor.    Cardiovascular: Normal rate, regular rhythm. Normal S1 and S2.  Good peripheral circulation. Respiratory: Normal respiratory effort without tachypnea or retractions. Lungs CTAB. Good air entry to the bases with no decreased or  absent breath sounds. Musculoskeletal: Full range of motion to all extremities. No gross deformities appreciated. No deformities or gross edema noted to the right elbow but inspection. Patient is tender to palpation over the proximal radius. No palpable abnormality. Otherwise no tenderness to palpation. Radial pulse intact distally. Sensation intact 5 digits. Neurologic:  Normal speech and language. No gross focal neurologic deficits are appreciated.  Skin:  Skin is warm, dry and intact. No rash noted. Psychiatric: Mood and affect are normal. Speech and behavior are normal. Patient exhibits appropriate insight and judgement.   ____________________________________________   LABS (all labs ordered are listed, but only abnormal results are displayed)  Labs Reviewed - No data to display ____________________________________________  EKG   ____________________________________________  RADIOLOGY Diamantina Providence , personally viewed and evaluated these images (plain radiographs) as part of my medical decision making, as well as reviewing the written report by the radiologist.  Dg Forearm Right  Result Date: 01/05/2016 CLINICAL DATA:  Injury. EXAM: RIGHT FOREARM - 2 VIEW COMPARISON:  No recent prior. FINDINGS: Displaced fracture of the radial head is noted. Ulna intact.  No other focal abnormality identified . IMPRESSION: Displaced fracture the radial head. Electronically Signed   By: Marcello Moores  Register   On: 01/05/2016 16:22    ____________________________________________    PROCEDURES  Procedure(s) performed:    .Splint Application Date/Time: 123XX123 4:50 PM Performed by: Betha Loa D Authorized by: Betha Loa D   Consent:    Consent obtained:  Verbal   Consent given by:  Patient   Risks discussed:  Numbness, pain and swelling Pre-procedure details:    Sensation:  Normal Procedure details:    Laterality:  Right   Location:  Elbow   Elbow:  R elbow   Splint type:  Long arm   Supplies:  Cotton padding, Ortho-Glass, elastic bandage and sling Post-procedure details:    Pain:  Improved   Sensation:  Normal   Patient tolerance of procedure:  Tolerated well, no immediate complications      Medications - No data to display   ____________________________________________   INITIAL IMPRESSION / ASSESSMENT AND PLAN / ED COURSE  Pertinent labs & imaging results that were available during my care of the patient were reviewed by me and considered in my medical decision making (see chart for details).  Review of the [place] CSRS was performed in accordance of the [place] prior to dispensing any controlled drugs.  Clinical Course     Patient's diagnosis is consistent with Radial head fracture. X-ray reveals a both diagnoses. Exam is reassuring outpatient. Neurovascularly intact distally. At this time, patient will be treated with posterior arm splint and referral to orthopedics. Patient was given pain medication for use at home. Patient will follow-up with orthopedics..  Patient is given ED precautions to return to the ED for any worsening or new symptoms.     ____________________________________________  FINAL CLINICAL IMPRESSION(S) / ED DIAGNOSES  Final diagnoses:  Closed displaced fracture of head of right radius, initial  encounter      NEW MEDICATIONS STARTED DURING THIS VISIT:  New Prescriptions   HYDROCODONE-ACETAMINOPHEN (NORCO/VICODIN) 5-325 MG TABLET    Take 1 tablet by mouth every 4 (four) hours as needed for moderate pain.        This chart was dictated using voice recognition software/Dragon. Despite best efforts to proofread, errors can occur which can change the meaning. Any change was purely unintentional.    Darletta Moll, PA-C 01/05/16 [place]  Theodosia Paling, MD 01/05/16 1758

## 2016-01-05 NOTE — ED Triage Notes (Signed)
Pt reports tripping and falling today on right elbow. Pt denies dizziness prior to fall. Pt reports right forearm and elbow pain. No obvious deformity noted. Pt able to move right hand and fingers without difficulty. Pt in no apparent distress in triage.

## 2016-01-05 NOTE — Telephone Encounter (Signed)
Per chart review tab pt went to [facility] ED. 

## 2016-01-05 NOTE — Telephone Encounter (Signed)
Patient Name: Connie Rowe DOB: May 01, 1950 Initial Comment Caller states, she needs an appointment - she fell out in her driveway - wrist pain- landed on elbow - popping movement. Verified Nurse Assessment Nurse: Vallery Sa, RN, [place] Date/Time (Eastern Time): 01/05/2016 2:09:16 PM Confirm and document reason for call. If symptomatic, describe symptoms. ---[name] states she fell and injured her right elbow and wrist about an hour ago (pain rated as an 8 on the 1 to 10 scale). No known cuts to the skin. Alert and responsive. Does the patient have any new or worsening symptoms? ---Yes Will a triage be completed? ---Yes Related visit to physician within the last 2 weeks? ---No Does the PT have any chronic conditions? (i.e. diabetes, asthma, etc.) ---No Is this a behavioral health or substance abuse call? ---No Guidelines Guideline Title Affirmed Question Affirmed Notes Elbow Injury [1] Numbness (i.e., loss of sensation) in fingers AND [2] present now Final Disposition User Go to ED Now Vallery Sa, RN, [facility] - ED Disagree/Comply: Comply

## 2016-01-12 DIAGNOSIS — S52121A Displaced fracture of head of right radius, initial encounter for closed fracture: Secondary | ICD-10-CM | POA: Diagnosis not present

## 2016-01-19 DIAGNOSIS — S52121D Displaced fracture of head of right radius, subsequent encounter for closed fracture with routine healing: Secondary | ICD-10-CM | POA: Diagnosis not present

## 2016-02-22 ENCOUNTER — Ambulatory Visit
Admission: RE | Admit: 2016-02-22 | Discharge: 2016-02-22 | Disposition: A | Discharge status: Home / Self Care | Payer: Medicare HMO | Source: Ambulatory Visit | Attending: Family Medicine | Admitting: Family Medicine

## 2016-02-22 DIAGNOSIS — Z1231 Encounter for screening mammogram for malignant neoplasm of breast: Secondary | ICD-10-CM | POA: Diagnosis not present

## 2016-02-22 DIAGNOSIS — M85852 Other specified disorders of bone density and structure, left thigh: Secondary | ICD-10-CM | POA: Diagnosis not present

## 2016-02-22 DIAGNOSIS — E2839 Other primary ovarian failure: Secondary | ICD-10-CM | POA: Diagnosis not present

## 2016-02-22 DIAGNOSIS — M8588 Other specified disorders of bone density and structure, other site: Secondary | ICD-10-CM | POA: Diagnosis not present

## 2016-02-22 DIAGNOSIS — Z1239 Encounter for other screening for malignant neoplasm of breast: Secondary | ICD-10-CM

## 2016-02-22 LAB — HM DEXA SCAN

## 2016-02-25 ENCOUNTER — Encounter: Payer: Self-pay | Admitting: *Deleted

## 2016-02-25 LAB — HM MAMMOGRAPHY

## 2016-03-06 ENCOUNTER — Encounter: Payer: Self-pay | Admitting: Family Medicine

## 2016-03-06 ENCOUNTER — Ambulatory Visit (INDEPENDENT_AMBULATORY_CARE_PROVIDER_SITE_OTHER): Payer: Medicare HMO | Admitting: Family Medicine

## 2016-03-06 VITALS — BP 124/76 | HR 93 | Temp 98.6°F | Ht 64.0 in | Wt 173.2 lb

## 2016-03-06 DIAGNOSIS — J0100 Acute maxillary sinusitis, unspecified: Secondary | ICD-10-CM | POA: Diagnosis not present

## 2016-03-06 MED ORDER — AMOXICILLIN-POT CLAVULANATE 875-125 MG PO TABS: 1.0000 | ORAL_TABLET | Freq: Two times a day (BID) | ORAL | 0 refills | Status: DC

## 2016-03-06 MED ORDER — HYDROCODONE-HOMATROPINE 5-1.5 MG/5ML PO SYRP: 5.0000 mL | ORAL_SOLUTION | Freq: Every evening | ORAL | 0 refills | Status: DC | PRN

## 2016-03-06 NOTE — Progress Notes (Signed)
Subjective:    Patient ID: Connie Rowe, female    DOB: 03/09/1950, 66 y.o.   MRN: [id number]  HPI Here for uri symptoms   Symptoms started 3 weeks ago   Bad cough - this weekend became a little production - phlegm is a little green  Nasal symptoms -congestion - cannot get anything out  Some sinus pain and pressure - even teeth hurt (started at the beginning)  Feels lousy  Few chills- initially- no fever however   Ears- itchy  Throat- not painful but sensitive from the cough   Taking delsym Benadryl  Ibuprofen   Patient Active Problem List   Diagnosis Date Noted  . Estrogen deficiency 10/19/2015  . Need for hepatitis C screening test 09/27/2015  . S/P right THA, AA 03/30/2015  . Pre-operative examination 02/24/2015  . S/P lumbar laminectomy 09/23/2014  . Essential hypertension 06/26/2014  . Encounter for Medicare annual wellness exam 06/03/2014  . Colon cancer screening 06/03/2014  . Acute sinusitis 02/10/2014  . Right hip pain 02/10/2014  . Sciatica 11/26/2013  . Hyperglycemia 04/17/2013  . Hirsutism 12/06/2010  . Routine general medical examination at a health care facility 09/10/2010  . Other screening mammogram 09/01/2010  . Disorder of bone and cartilage 10/22/2006  . URINARY INCONTINENCE, STRESS 08/22/2006  . History of Helicobacter pylori infection 04/18/2006  . Hyperlipidemia 04/18/2006  . DEPRESSION 04/18/2006  . EXTERNAL HEMORRHOIDS 04/18/2006  . GERD 04/18/2006  . HIATAL HERNIA 04/18/2006  . OVERACTIVE BLADDER 04/18/2006  . HEMATURIA, MICROSCOPIC 04/18/2006  . GAMMA GLUTAMYL TRANSFERASE, SERUM, ELEVATED 04/18/2006  . PANCREATITIS, HX OF 04/18/2006  . DIVERTICULOSIS, COLON 01/02/2000   Past Medical History:  Diagnosis Date  . Arthritis   . Colon polyps   . Depression   . Esophageal stricture   . Falls   . GERD (gastroesophageal reflux disease)   . H/O fracture of humerus 2006  . History of bronchitis   . History of hiatal hernia   .  Hyperlipidemia   . Hypertension   . Nocturia   . Osteopenia   . Pancreatitis    History of   Past Surgical History:  Procedure Laterality Date  . BLADDER REPAIR    . COLONOSCOPY    . DIAGNOSTIC LAPAROSCOPY     tubal ligation  . DILATION AND CURETTAGE OF UTERUS    . ESOPHAGEAL DILATION    . LUMBAR LAMINECTOMY/DECOMPRESSION MICRODISCECTOMY Right 09/23/2014   Procedure: Laminectomy and Foraminotomy - Lumbar three- lumbar four - right ;  Surgeon: Eustace Moore, MD;  Location: [facility];  Service: Neurosurgery;  Laterality: Right;  . MULTIPLE TOOTH EXTRACTIONS    . TONSILLECTOMY    . TOTAL HIP ARTHROPLASTY Right 03/30/2015   Procedure: RIGHT TOTAL HIP ARTHROPLASTY ANTERIOR APPROACH;  Surgeon: Paralee Cancel, MD;  Location: [facility];  Service: Orthopedics;  Laterality: Right;  . TUBAL LIGATION    . UPPER GI ENDOSCOPY     Social History  Substance Use Topics  . Smoking status: Never Smoker  . Smokeless tobacco: Never Used  . Alcohol use No   Family History  Problem Relation Age of Onset  . Stroke Mother   . Hypertension Mother   . Cancer Mother     colon CA  . Colon cancer Mother   . Drug abuse Son   . Stroke Brother   . Colon polyps Neg Hx   . Crohn's disease Neg Hx   . Pancreatic cancer Neg Hx   . Rectal cancer  Neg Hx   . Stomach cancer Neg Hx   . Ulcerative colitis Neg Hx   . Breast cancer Neg Hx    Allergies  Allergen Reactions  . Ace Inhibitors Cough   Current Outpatient Prescriptions on File Prior to Visit  Medication Sig Dispense Refill  . Calcium Carbonate-Vitamin D (CALCIUM-VITAMIN D) 500-200 MG-UNIT per tablet Take 1 tablet by mouth daily.      . diphenhydrAMINE (SOMINEX) 25 MG tablet Take 25 mg by mouth at bedtime as needed for sleep.    Marland Kitchen docusate sodium (COLACE) 100 MG capsule Take 1 capsule (100 mg total) by mouth 2 (two) times daily. 10 capsule 0  . ferrous sulfate 325 (65 FE) MG tablet Take 1 tablet (325 mg total) by mouth 3 (three) times daily after  meals.  3  . FLUoxetine (PROZAC) 20 MG capsule Take 1 capsule (20 mg total) by mouth daily. Needs to schedule physical for future refills 90 capsule 3  . hydrochlorothiazide (MICROZIDE) 12.5 MG capsule Take 1 capsule (12.5 mg total) by mouth daily. 90 capsule 3  . HYDROcodone-acetaminophen (NORCO/VICODIN) 5-325 MG tablet Take 1 tablet by mouth every 4 (four) hours as needed for moderate pain. 20 tablet 0  . pantoprazole (PROTONIX) 40 MG tablet Take 1 tablet (40 mg total) by mouth 2 (two) times daily. Needs to schedule physical for future refills 180 tablet 3  . polyethylene glycol (MIRALAX / GLYCOLAX) packet Take 17 g by mouth 2 (two) times daily. 14 each 0  . pravastatin (PRAVACHOL) 80 MG tablet Take 1 tablet (80 mg total) by mouth daily. Needs to schedule physical for future refills 90 tablet 3  . tiZANidine (ZANAFLEX) 4 MG tablet Take 1 tablet (4 mg total) by mouth every 6 (six) hours as needed for muscle spasms. 40 tablet 0   No current facility-administered medications on file prior to visit.     Review of Systems  Constitutional: Positive for appetite change. Negative for chills, fatigue and fever.  HENT: Positive for congestion, ear pain, postnasal drip, rhinorrhea, sinus pressure and sore throat. Negative for nosebleeds.   Eyes: Negative for pain, redness and itching.  Respiratory: Positive for cough. Negative for shortness of breath and wheezing.   Cardiovascular: Negative for chest pain.  Gastrointestinal: Negative for abdominal pain, diarrhea, nausea and vomiting.  Endocrine: Negative for polyuria.  Genitourinary: Negative for dysuria, frequency and urgency.  Musculoskeletal: Negative for arthralgias and myalgias.  Allergic/Immunologic: Negative for immunocompromised state.  Neurological: Positive for headaches. Negative for dizziness, tremors, syncope, weakness and numbness.  Hematological: Negative for adenopathy. Does not bruise/bleed easily.  Psychiatric/Behavioral: Negative  for dysphoric mood. The patient is not nervous/anxious.        Objective:   Physical Exam  Constitutional: She appears well-developed and well-nourished. No distress.  Well appearing  Frequent hacking cough  HENT:  Head: Normocephalic and atraumatic.  Right Ear: External ear normal.  Left Ear: External ear normal.  Mouth/Throat: Oropharynx is clear and moist. No oropharyngeal exudate.  Nares are injected and congested  Bilateral maxillary sinus tenderness  Post nasal drip   Eyes: Conjunctivae and EOM are normal. Pupils are equal, round, and reactive to light. Right eye exhibits no discharge. Left eye exhibits no discharge.  Neck: Normal range of motion. Neck supple.  Cardiovascular: Normal rate and regular rhythm.   Pulmonary/Chest: Effort normal. No respiratory distress. She has wheezes. She has no rales. She exhibits no tenderness.  Harsh bs with scant wheeze on forced exp only  No rales  Scattered rhonchi Good air exch  Lymphadenopathy:    She has no cervical adenopathy.  Neurological: She is alert. No cranial nerve deficit.  Skin: Skin is warm and dry. No rash noted.  Psychiatric: She has a normal mood and affect.          Assessment & Plan:   Problem List Items Addressed This Visit      Respiratory   Acute sinusitis - Primary    S/p 3 weeks of uri/ also symptoms of bronchitis Cover with augmentin  Fluids and mucinex DM Hycodan cough med at bedtime with caution of sedation  Disc symptomatic care - see instructions on AVS  Update if not starting to improve in a week or if worsening        Relevant Medications   amoxicillin-clavulanate (AUGMENTIN) 875-125 MG tablet   HYDROcodone-homatropine (HYCODAN) 5-1.5 MG/5ML syrup

## 2016-03-06 NOTE — Progress Notes (Signed)
Pre visit review using our clinic review tool, if applicable. No additional management support is needed unless otherwise documented below in the visit note. 

## 2016-03-06 NOTE — Assessment & Plan Note (Signed)
S/p 3 weeks of uri/ also symptoms of bronchitis Cover with augmentin  Fluids and mucinex DM Hycodan cough med at bedtime with caution of sedation  Disc symptomatic care - see instructions on AVS  Update if not starting to improve in a week or if worsening

## 2016-03-06 NOTE — Patient Instructions (Signed)
I think you have sinusitis with bronchitis  Drink fluids and rest  Take the augmentin as directed  Try the hycodan cough med at night- caution of sedation  mucinex DM over the counter for congestion and cough  Update if not starting to improve in a week or if worsening    Breathe steam and use warm compresses for sinuses

## 2016-03-30 ENCOUNTER — Ambulatory Visit (INDEPENDENT_AMBULATORY_CARE_PROVIDER_SITE_OTHER): Payer: Medicare HMO | Admitting: Family Medicine

## 2016-03-30 ENCOUNTER — Encounter: Payer: Self-pay | Admitting: Family Medicine

## 2016-03-30 VITALS — BP 146/72 | HR 87 | Temp 98.5°F | Wt 174.5 lb

## 2016-03-30 DIAGNOSIS — R42 Dizziness and giddiness: Secondary | ICD-10-CM | POA: Insufficient documentation

## 2016-03-30 MED ORDER — MECLIZINE HCL 25 MG PO TABS: 12.5000 mg | ORAL_TABLET | Freq: Two times a day (BID) | ORAL | 0 refills | Status: DC | PRN

## 2016-03-30 NOTE — Progress Notes (Signed)
Pre visit review using our clinic review tool, if applicable. No additional management support is needed unless otherwise documented below in the visit note. 

## 2016-03-30 NOTE — Progress Notes (Signed)
BP (!) 146/72   Pulse 87   Temp 98.5 F (36.9 C) (Oral)   Wt 174 lb 8 oz (79.2 kg)   SpO2 94%   BMI 29.95 kg/m    CC: dizziness in am Subjective:    Patient ID: Connie Rowe, female    DOB: 1950/12/29, 66 y.o.   MRN: [id number]  HPI: Connie Rowe is a 66 y.o. female presenting on 03/30/2016 for Dizziness (when waking in the am) and Insomnia (appx 2-3hrs at night )   For last several weeks noticing increasing dizziness in am - even if she awakens at night. Also notices sense of motion when stationary in car if a neighboring car moves. Worse dizziness when getting up from supine. Some nausea associated with this. Describes dizziness as "room spinning". Dizziness lasts a few seconds. Better with sitting down. Occasional occipital headaches without neck pain/stiffness.   No syncope/presyncope. No vomiting, fevers/chills, vision changes, hearing changes or tinnitus. No palpitations.  1 month ago she was treated for bronchitis and sinusitis. Ongoing coughing but overall better.   Insomnia - treats with benadryl or ibuprofen PM.  She had fall 12/2015 - broken R elbow.  H/o R hip replacement.   Relevant past medical, surgical, family and social history reviewed and updated as indicated. Interim medical history since our last visit reviewed. Allergies and medications reviewed and updated. Outpatient Medications Prior to Visit  Medication Sig Dispense Refill  . Calcium Carbonate-Vitamin D (CALCIUM-VITAMIN D) 500-200 MG-UNIT per tablet Take 1 tablet by mouth daily.      . diphenhydrAMINE (SOMINEX) 25 MG tablet Take 25 mg by mouth at bedtime as needed for sleep.    Marland Kitchen docusate sodium (COLACE) 100 MG capsule Take 1 capsule (100 mg total) by mouth 2 (two) times daily. 10 capsule 0  . ferrous sulfate 325 (65 FE) MG tablet Take 1 tablet (325 mg total) by mouth 3 (three) times daily after meals.  3  . FLUoxetine (PROZAC) 20 MG capsule Take 1 capsule (20 mg total) by mouth daily. Needs to  schedule physical for future refills 90 capsule 3  . hydrochlorothiazide (MICROZIDE) 12.5 MG capsule Take 1 capsule (12.5 mg total) by mouth daily. 90 capsule 3  . HYDROcodone-acetaminophen (NORCO/VICODIN) 5-325 MG tablet Take 1 tablet by mouth every 4 (four) hours as needed for moderate pain. 20 tablet 0  . pantoprazole (PROTONIX) 40 MG tablet Take 1 tablet (40 mg total) by mouth 2 (two) times daily. Needs to schedule physical for future refills 180 tablet 3  . polyethylene glycol (MIRALAX / GLYCOLAX) packet Take 17 g by mouth 2 (two) times daily. 14 each 0  . pravastatin (PRAVACHOL) 80 MG tablet Take 1 tablet (80 mg total) by mouth daily. Needs to schedule physical for future refills 90 tablet 3  . amoxicillin-clavulanate (AUGMENTIN) 875-125 MG tablet Take 1 tablet by mouth 2 (two) times daily. 14 tablet 0  . HYDROcodone-homatropine (HYCODAN) 5-1.5 MG/5ML syrup Take 5 mLs by mouth at bedtime as needed for cough. 120 mL 0  . tiZANidine (ZANAFLEX) 4 MG tablet Take 1 tablet (4 mg total) by mouth every 6 (six) hours as needed for muscle spasms. 40 tablet 0   No facility-administered medications prior to visit.      Per HPI unless specifically indicated in ROS section below Review of Systems     Objective:    BP (!) 146/72   Pulse 87   Temp 98.5 F (36.9 C) (Oral)   Wt 174  lb 8 oz (79.2 kg)   SpO2 94%   BMI 29.95 kg/m   Wt Readings from Last 3 Encounters:  03/30/16 174 lb 8 oz (79.2 kg)  03/06/16 173 lb 4 oz (78.6 kg)  01/05/16 160 lb (72.6 kg)    Physical Exam  Constitutional: She is oriented to person, place, and time. She appears well-developed and well-nourished. No distress.  HENT:  Head: Normocephalic and atraumatic.  Right Ear: Hearing, tympanic membrane, external ear and ear canal normal.  Left Ear: Hearing, tympanic membrane, external ear and ear canal normal.  Nose: No mucosal edema or rhinorrhea. Right sinus exhibits no maxillary sinus tenderness and no frontal sinus  tenderness. Left sinus exhibits no maxillary sinus tenderness and no frontal sinus tenderness.  Mouth/Throat: Uvula is midline, oropharynx is clear and moist and mucous membranes are normal. No oropharyngeal exudate, posterior oropharyngeal edema, posterior oropharyngeal erythema or tonsillar abscesses.  Eyes: Conjunctivae and EOM are normal. Pupils are equal, round, and reactive to light. No scleral icterus.  Neck: Normal range of motion. Neck supple.  Cardiovascular: Normal rate, regular rhythm, normal heart sounds and intact distal pulses.   No murmur heard. Pulmonary/Chest: Effort normal and breath sounds normal. No respiratory distress. She has no wheezes. She has no rales.  Lymphadenopathy:    She has no cervical adenopathy.  Neurological: She is alert and oriented to person, place, and time. She has normal strength. No cranial nerve deficit or sensory deficit. She displays a negative Romberg sign. Coordination and gait normal.  CN 2-12 intact  FTN intact EOMI Neg dix hallpike bilaterally however some fleeting vertigo without nystagmus with sitting up from supine  Skin: Skin is warm and dry. No rash noted.  Nursing note and vitals reviewed.     Assessment & Plan:   Problem List Items Addressed This Visit    Vertigo - Primary    Pretty classical for vertigo, mild - anticipate peripheral. Nonfocal neurological exam. Started after sinusitis - ?vestibular neuritis. Will trial meclizine PRN. If no improvement with this, discussed vestibular rehab. If worsening, advised let us know to consider head imaging. Pt agrees with plan.           Follow up plan: Return if symptoms worsen or fail to improve.  [name] Bush, MD

## 2016-03-30 NOTE — Assessment & Plan Note (Addendum)
Pretty classical for vertigo, mild - anticipate peripheral. Nonfocal neurological exam. Started after sinusitis - ?vestibular neuritis. Will trial meclizine PRN. If no improvement with this, discussed vestibular rehab. If worsening, advised let us know to consider head imaging. Pt agrees with plan.

## 2016-03-30 NOTE — Patient Instructions (Signed)
You are describing vertigo. Treat with meclizine 1/2-1 tablet as needed - may try at night.  If no better with this, let us know for vestibular rehab referral.  Let us know if worsening symptoms.

## 2016-05-23 ENCOUNTER — Encounter: Payer: Self-pay | Admitting: Family Medicine

## 2016-05-23 ENCOUNTER — Ambulatory Visit (INDEPENDENT_AMBULATORY_CARE_PROVIDER_SITE_OTHER): Payer: Medicare HMO | Admitting: Family Medicine

## 2016-05-23 VITALS — BP 128/76 | HR 81 | Temp 98.6°F | Ht 64.0 in | Wt 176.8 lb

## 2016-05-23 DIAGNOSIS — I10 Essential (primary) hypertension: Secondary | ICD-10-CM | POA: Diagnosis not present

## 2016-05-23 DIAGNOSIS — R0683 Snoring: Secondary | ICD-10-CM

## 2016-05-23 DIAGNOSIS — R400 Somnolence: Secondary | ICD-10-CM | POA: Insufficient documentation

## 2016-05-23 NOTE — Assessment & Plan Note (Signed)
bp in fair control at this time  BP Readings from Last 1 Encounters:  05/23/16 128/76   No changes needed Disc lifstyle change with low sodium diet and exercise

## 2016-05-23 NOTE — Progress Notes (Signed)
Subjective:    Patient ID: Connie Rowe, female    DOB: Jun 04, 1950, 66 y.o.   MRN: [id number]  HPI Here with concerns of sleep apnea  A few years ago in the hospital her nurses noted she may have sleep apnea -heard her snoring and she filled out a form  She snores very loudly  Her husband had to move out of the room  She wakes up every hour all night - very different (a snore often wakes her up)  She usually goes to bed 1-2 am and gets up 7 to get grandson to school and then goes back to bed for 2 hours   She cannot go to bed earlier -is a night person    Years ago- weight loss (35 lb) did not help the snoring   Wakes up very very tired  She nods off during the day easily if she sits still  Has never fallen asleep at the wheel    She has some vertigo when she wakes up quickly and starts moving    Wt Readings from Last 3 Encounters:  05/23/16 176 lb 12 oz (80.2 kg)  03/30/16 174 lb 8 oz (79.2 kg)  03/06/16 173 lb 4 oz (78.6 kg)   bmi 30.3  Patient Active Problem List   Diagnosis Date Noted  . Daytime somnolence 05/23/2016  . Snoring 05/23/2016  . Vertigo 03/30/2016  . Estrogen deficiency 10/19/2015  . Need for hepatitis C screening test 09/27/2015  . S/P right THA, AA 03/30/2015  . Pre-operative examination 02/24/2015  . S/P lumbar laminectomy 09/23/2014  . Essential hypertension 06/26/2014  . Encounter for Medicare annual wellness exam 06/03/2014  . Colon cancer screening 06/03/2014  . Right hip pain 02/10/2014  . Sciatica 11/26/2013  . Hyperglycemia 04/17/2013  . Hirsutism 12/06/2010  . Routine general medical examination at a health care facility 09/10/2010  . Other screening mammogram 09/01/2010  . Disorder of bone and cartilage 10/22/2006  . URINARY INCONTINENCE, STRESS 08/22/2006  . History of Helicobacter pylori infection 04/18/2006  . Hyperlipidemia 04/18/2006  . DEPRESSION 04/18/2006  . EXTERNAL HEMORRHOIDS 04/18/2006  . GERD 04/18/2006  .  HIATAL HERNIA 04/18/2006  . OVERACTIVE BLADDER 04/18/2006  . HEMATURIA, MICROSCOPIC 04/18/2006  . GAMMA GLUTAMYL TRANSFERASE, SERUM, ELEVATED 04/18/2006  . PANCREATITIS, HX OF 04/18/2006  . DIVERTICULOSIS, COLON 01/02/2000   Past Medical History:  Diagnosis Date  . Arthritis   . Colon polyps   . Depression   . Esophageal stricture   . Falls   . GERD (gastroesophageal reflux disease)   . H/O fracture of humerus 2006  . History of bronchitis   . History of hiatal hernia   . Hyperlipidemia   . Hypertension   . Nocturia   . Osteopenia   . Pancreatitis    History of   Past Surgical History:  Procedure Laterality Date  . BLADDER REPAIR    . COLONOSCOPY    . DIAGNOSTIC LAPAROSCOPY     tubal ligation  . DILATION AND CURETTAGE OF UTERUS    . ESOPHAGEAL DILATION    . LUMBAR LAMINECTOMY/DECOMPRESSION MICRODISCECTOMY Right 09/23/2014   Procedure: Laminectomy and Foraminotomy - Lumbar three- lumbar four - right ;  Surgeon: Eustace Moore, MD;  Location: [facility];  Service: Neurosurgery;  Laterality: Right;  . MULTIPLE TOOTH EXTRACTIONS    . TONSILLECTOMY    . TOTAL HIP ARTHROPLASTY Right 03/30/2015   Procedure: RIGHT TOTAL HIP ARTHROPLASTY ANTERIOR APPROACH;  Surgeon: Paralee Cancel, MD;  Location: [facility];  Service: Orthopedics;  Laterality: Right;  . TUBAL LIGATION    . UPPER GI ENDOSCOPY     Social History  Substance Use Topics  . Smoking status: Never Smoker  . Smokeless tobacco: Never Used  . Alcohol use No   Family History  Problem Relation Age of Onset  . Stroke Mother   . Hypertension Mother   . Cancer Mother     colon CA  . Colon cancer Mother   . Drug abuse Son   . Stroke Brother   . Colon polyps Neg Hx   . Crohn's disease Neg Hx   . Pancreatic cancer Neg Hx   . Rectal cancer Neg Hx   . Stomach cancer Neg Hx   . Ulcerative colitis Neg Hx   . Breast cancer Neg Hx    Allergies  Allergen Reactions  . Ace Inhibitors Cough   Current Outpatient Prescriptions  on File Prior to Visit  Medication Sig Dispense Refill  . Calcium Carbonate-Vitamin D (CALCIUM-VITAMIN D) 500-200 MG-UNIT per tablet Take 1 tablet by mouth daily.      . diphenhydrAMINE (SOMINEX) 25 MG tablet Take 25 mg by mouth at bedtime as needed for sleep.    Marland Kitchen docusate sodium (COLACE) 100 MG capsule Take 1 capsule (100 mg total) by mouth 2 (two) times daily. 10 capsule 0  . FLUoxetine (PROZAC) 20 MG capsule Take 1 capsule (20 mg total) by mouth daily. Needs to schedule physical for future refills 90 capsule 3  . hydrochlorothiazide (MICROZIDE) 12.5 MG capsule Take 1 capsule (12.5 mg total) by mouth daily. 90 capsule 3  . pantoprazole (PROTONIX) 40 MG tablet Take 1 tablet (40 mg total) by mouth 2 (two) times daily. Needs to schedule physical for future refills 180 tablet 3  . polyethylene glycol (MIRALAX / GLYCOLAX) packet Take 17 g by mouth 2 (two) times daily. 14 each 0  . pravastatin (PRAVACHOL) 80 MG tablet Take 1 tablet (80 mg total) by mouth daily. Needs to schedule physical for future refills 90 tablet 3   No current facility-administered medications on file prior to visit.     Review of Systems    Review of Systems  Constitutional: Negative for fever, appetite change,  and unexpected weight change. pos for unrestful sleep and day time somnolence  ENT pos for mouth breathing and loud snoring at night  Eyes: Negative for pain and visual disturbance.  Respiratory: Negative for cough and shortness of breath.   Cardiovascular: Negative for cp or palpitations    Gastrointestinal: Negative for nausea, diarrhea and constipation.  Genitourinary: Negative for urgency and frequency.  Skin: Negative for pallor or rash   Neurological: Negative for weakness, light-headedness, numbness and headaches.  Hematological: Negative for adenopathy. Does not bruise/bleed easily.  Psychiatric/Behavioral: Negative for dysphoric mood. The patient is not nervous/anxious.      Objective:   Physical Exam   Constitutional: She appears well-developed and well-nourished. No distress.  obese and well appearing   HENT:  Head: Normocephalic and atraumatic.  Mouth/Throat: Oropharynx is clear and moist.  Mild nasal congestion   Eyes: Conjunctivae and EOM are normal. Pupils are equal, round, and reactive to light.  Neck: Normal range of motion. Neck supple. No JVD present. Carotid bruit is not present. No thyromegaly present.  Cardiovascular: Normal rate, regular rhythm, normal heart sounds and intact distal pulses.  Exam reveals no gallop.   Pulmonary/Chest: Effort normal and breath sounds normal. No respiratory distress. She has no  wheezes. She has no rales.  No crackles Good air exch  Abdominal: Soft. Bowel sounds are normal. She exhibits no distension, no abdominal bruit and no mass. There is no tenderness.  Musculoskeletal: She exhibits no edema.  Lymphadenopathy:    She has no cervical adenopathy.  Neurological: She is alert. She has normal reflexes.  Skin: Skin is warm and dry. No rash noted. No pallor.  Psychiatric: She has a normal mood and affect. Her mood appears not anxious. She does not exhibit a depressed mood.          Assessment & Plan:   Problem List Items Addressed This Visit      Cardiovascular and Mediastinum   Essential hypertension    bp in fair control at this time  BP Readings from Last 1 Encounters:  05/23/16 128/76   No changes needed Disc lifstyle change with low sodium diet and exercise          Other   Daytime somnolence   Relevant Orders   Ambulatory referral to Pulmonology   Snoring    Loud snoring/ witnessed apnea Suspect OSA Ref to pulm for sleep eval  Handout given        Relevant Orders   Ambulatory referral to Pulmonology

## 2016-05-23 NOTE — Assessment & Plan Note (Signed)
Loud snoring/ witnessed apnea Suspect OSA Ref to pulm for sleep eval  Handout given

## 2016-05-23 NOTE — Progress Notes (Signed)
Pre visit review using our clinic review tool, if applicable. No additional management support is needed unless otherwise documented below in the visit note. 

## 2016-05-23 NOTE — Assessment & Plan Note (Signed)
With loud snoring and witnessed apnea Suspect OSA Warned re: driving/ risk of sleep at wheel Ref to pulmonary -eval and tx  Disc wt loss Disc pot tx

## 2016-05-23 NOTE — Patient Instructions (Signed)
We will call you regarding a referral to pulmonary for a sleep eval   Take care of yourself  Be careful driving if you are sleepy

## 2016-06-21 ENCOUNTER — Ambulatory Visit (INDEPENDENT_AMBULATORY_CARE_PROVIDER_SITE_OTHER): Payer: Medicare HMO | Admitting: Internal Medicine

## 2016-06-21 ENCOUNTER — Encounter: Payer: Self-pay | Admitting: Internal Medicine

## 2016-06-21 VITALS — BP 160/90 | HR 83 | Resp 16 | Ht 64.0 in | Wt 174.0 lb

## 2016-06-21 DIAGNOSIS — G4719 Other hypersomnia: Secondary | ICD-10-CM | POA: Diagnosis not present

## 2016-06-21 NOTE — Patient Instructions (Signed)
Needs sleep study

## 2016-06-21 NOTE — Progress Notes (Signed)
Name: Connie Rowe MRN: [id number] DOB: 06-24-1950    CONSULTATION DATE: 06/21/2016   REFERRING MD : Glori Bickers  CHIEF COMPLAINT: having sleep problems  STUDIES: CXR 08/2014 I have Independently reviewed images of  CXR   on 06/21/2016 Interpretation:no effusions, no edema, no disease noted    HISTORY OF PRESENT ILLNESS:   66 yo pleasant white female with excessive tiredenss  Patient  has been having sleep problems for years Patient has been having excessive daytime sleepiness Patient has been having extreme fatigue and tiredness, lack of energy Patient has been told that she has very  Loud snoring every night husband had to sleep in other room  She is non-smoker, no etoh abuse Works as Scientist, 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  has a past surgical history that includes Tubal ligation; Tonsillectomy; Bladder repair; Colonoscopy; Upper gi endoscopy; Esophageal dilation; Diagnostic laparoscopy; Dilation and curettage of uterus; Multiple tooth extractions; Lumbar laminectomy/decompression microdiscectomy (Right, 09/23/2014); and Total hip arthroplasty (Right, 03/30/2015). Prior to Admission medications   Medication Sig Start Date End Date Taking? Authorizing Provider  Calcium Carbonate-Vitamin D (CALCIUM-VITAMIN D) 500-200 MG-UNIT per tablet Take 1 tablet by mouth daily.     Yes [provider]  diphenhydrAMINE (SOMINEX) 25 MG tablet Take 25 mg by mouth at bedtime as needed for sleep.   Yes [provider]  docusate sodium (COLACE) 100 MG capsule Take 1  capsule (100 mg total) by mouth 2 (two) times daily. 03/31/15  Yes Babish, Rodman Key, PA-C  FLUoxetine (PROZAC) 20 MG capsule Take 1 capsule (20 mg total) by mouth daily. Needs to schedule physical for future refills 10/19/15  Yes Tower, Wynelle Fanny, MD  hydrochlorothiazide (MICROZIDE) 12.5 MG capsule Take 1 capsule (12.5 mg total) by mouth daily. 10/17/15  Yes Tower, Wynelle Fanny, MD  pantoprazole (PROTONIX) 40 MG tablet Take 1 tablet (40 mg total) by mouth 2 (two) times daily. Needs to schedule physical for future refills 10/19/15  Yes Tower, Wynelle Fanny, MD  polyethylene glycol (MIRALAX / GLYCOLAX) packet Take 17 g by mouth 2 (two) times daily. 03/31/15  Yes Babish, Rodman Key, PA-C  pravastatin (PRAVACHOL) 80 MG tablet Take 1 tablet (80 mg total) by mouth daily. Needs to schedule physical for future refills 10/19/15  Yes Tower, Wynelle Fanny, MD   Allergies  Allergen Reactions  . Ace Inhibitors Cough    FAMILY HISTORY:  family history includes Cancer in her mother; Colon cancer in her mother; Drug abuse in her son; Hypertension in her mother; Stroke in her brother and mother. SOCIAL HISTORY:  reports that she has never smoked. She has never used smokeless tobacco. She reports that she does not drink alcohol or use drugs.  REVIEW OF SYSTEMS:   Constitutional: Negative for fever, chills, weight loss, +malaise/fatigue  HENT: Negative for hearing loss, ear pain, nosebleeds, congestion, sore throat, neck pain, tinnitus and ear discharge.   Eyes: Negative for blurred vision, double vision, photophobia, pain, discharge and redness.  Respiratory: Negative for cough, hemoptysis, sputum production, shortness of breath, wheezing and stridor.   Cardiovascular: Negative for chest pain, palpitations,  orthopnea, claudication, leg swelling and PND.  Gastrointestinal: Negative for heartburn, nausea, vomiting, abdominal pain, diarrhea, constipation, blood in stool and melena.  Genitourinary: Negative for dysuria, urgency, frequency,  hematuria and flank pain.  Musculoskeletal: + joint pain Skin: Negative for itching and rash.  Neurological: Negative for dizziness, tingling, tremors, sensory change, speech change, focal weakness, seizures, loss of consciousness, weakness and headaches.  Endo/Heme/Allergies: Negative for environmental allergies and polydipsia. Does not bruise/bleed easily.  ALL OTHER ROS ARE NEGATIVE    VITAL SIGNs BP (!) 160/90 (BP Location: Right Arm, Cuff Size: Normal)   Pulse 83   Resp 16   Ht 5\' 4"  (1.626 m)   Wt 174 lb (78.9 kg)   SpO2 92%   BMI 29.87 kg/m   Physical Examination:   GENERAL:NAD, no fevers, chills, no weakness no fatigue HEAD: Normocephalic, atraumatic.  EYES: Pupils equal, round, reactive to light. Extraocular muscles intact. No scleral icterus.  MOUTH: Moist mucosal membrane.   EAR, NOSE, THROAT: Clear without exudates. No external lesions. Malanpatti class 3 NECK: Supple. No thyromegaly. No nodules. No JVD.  PULMONARY:CTA B/L no wheezes, no crackles, no rhonchi CARDIOVASCULAR: S1 and S2. Regular rate and rhythm. No murmurs, rubs, or gallops. No edema.  GASTROINTESTINAL: Soft, nontender, nondistended. No masses. Positive bowel sounds.  MUSCULOSKELETAL: No swelling, clubbing, or edema. Range of motion full in all extremities.  NEUROLOGIC: Cranial nerves II through XII are intact. No gross focal neurological deficits.  SKIN: No ulceration, lesions, rashes, or cyanosis. Skin warm and dry. Turgor intact.  PSYCHIATRIC: Mood, affect within normal limits. The patient is awake, alert and oriented x 3. Insight, judgment intact.        ASSESSMENT / PLAN: 66 yo pleasant white female with excessive daytime sleepiness and fatigue with very loud snoring findings to suggest Sleep apnea  Patient will need Sleep Study ASAP Recommend Diet and Exercise  Follow up after testing completed   Patient satisfied with Plan of action and management. All questions answered  Corrin Parker, M.D.  [facility]  Medical Director [place] Director [facility] Cardio-Pulmonary Department

## 2016-07-04 ENCOUNTER — Telehealth: Payer: Self-pay | Admitting: Internal Medicine

## 2016-07-04 NOTE — Telephone Encounter (Signed)
Order was faxed to [facility] on 06/21/16 with confirmation from fax that order was received. LMOAM for Jacki at [facility] to contact patient to schedule. If there is an issue with scheduling patient, to please contact me back at [phone]. Rhonda J Cobb

## 2016-07-04 NOTE — Telephone Encounter (Signed)
Pt calling stating she has not received any notice about her doing the sleep study Please advise.

## 2016-07-04 NOTE — Telephone Encounter (Signed)
Pt stating she hasn't heard about scheduling her sleep study that was ordered 06/21/16.

## 2016-07-04 NOTE — Telephone Encounter (Signed)
Spoke with [name] Comment at [facility] and she will contact patient today to schedule. Connie Rowe

## 2016-07-05 NOTE — Telephone Encounter (Signed)
Sleep Study scheduled for 07/20/16. [name] Comment scheduled appointment with patient and patient is aware of appointment date, time and location. Rhonda J Cobb   Nothing else needed at this time. Rhonda J Cobb

## 2016-09-05 ENCOUNTER — Encounter: Payer: Self-pay | Admitting: Internal Medicine

## 2016-09-05 ENCOUNTER — Ambulatory Visit: Payer: Medicare HMO | Attending: Internal Medicine

## 2016-09-05 DIAGNOSIS — G4719 Other hypersomnia: Secondary | ICD-10-CM

## 2016-09-05 DIAGNOSIS — G4733 Obstructive sleep apnea (adult) (pediatric): Secondary | ICD-10-CM | POA: Insufficient documentation

## 2016-09-08 DIAGNOSIS — G4733 Obstructive sleep apnea (adult) (pediatric): Secondary | ICD-10-CM | POA: Diagnosis not present

## 2016-09-11 ENCOUNTER — Telehealth: Payer: Self-pay | Admitting: *Deleted

## 2016-09-11 DIAGNOSIS — G4733 Obstructive sleep apnea (adult) (pediatric): Secondary | ICD-10-CM

## 2016-09-11 NOTE — Telephone Encounter (Signed)
Patient aware results of sleep study. Orders placed. Nothing further needed.

## 2016-09-19 DIAGNOSIS — M169 Osteoarthritis of hip, unspecified: Secondary | ICD-10-CM | POA: Diagnosis not present

## 2016-09-19 DIAGNOSIS — G4733 Obstructive sleep apnea (adult) (pediatric): Secondary | ICD-10-CM | POA: Diagnosis not present

## 2016-10-20 DIAGNOSIS — M169 Osteoarthritis of hip, unspecified: Secondary | ICD-10-CM | POA: Diagnosis not present

## 2016-10-20 DIAGNOSIS — G4733 Obstructive sleep apnea (adult) (pediatric): Secondary | ICD-10-CM | POA: Diagnosis not present

## 2016-11-03 ENCOUNTER — Encounter: Payer: Self-pay | Admitting: Internal Medicine

## 2016-11-03 ENCOUNTER — Ambulatory Visit (INDEPENDENT_AMBULATORY_CARE_PROVIDER_SITE_OTHER): Payer: Medicare HMO | Admitting: Internal Medicine

## 2016-11-03 ENCOUNTER — Telehealth: Payer: Self-pay | Admitting: Internal Medicine

## 2016-11-03 VITALS — BP 120/84 | HR 78 | Resp 16 | Ht 64.0 in | Wt 174.0 lb

## 2016-11-03 DIAGNOSIS — G4733 Obstructive sleep apnea (adult) (pediatric): Secondary | ICD-10-CM

## 2016-11-03 MED ORDER — ZOLPIDEM TARTRATE ER 6.25 MG PO TBCR: 6.2500 mg | EXTENDED_RELEASE_TABLET | Freq: Every evening | ORAL | 0 refills | Status: DC | PRN

## 2016-11-03 NOTE — Progress Notes (Signed)
   Name: Connie Rowe MRN: [id number] DOB: 04-08-1950    CONSULTATION DATE: 11/03/2016   REFERRING MD : Glori Bickers  CHIEF COMPLAINT: having sleep problems  STUDIES: CXR 08/2014 I have Independently reviewed images of  CXR   on 06/21/2016 Interpretation:no effusions, no edema, no disease noted    HISTORY OF PRESENT ILLNESS:   66 year old pleasant white female here today to discuss sleep apnea results and along with CPAP titration results At this time her AHI was 12.8 on the sleep study and upon further assessment of her compliance report patient is on auto CPAP pressure minimum pressure of 8 cm of water pressure maximum pressures 12 cm of water pressure she is 100% compliant in terms of usage days She uses 87% compliance of more than 4 hours per day and her AHI is down to 2.4  She is non-smoker, no etoh abuse Works as Scientist, 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 Ace Inhibitors Cough    REVIEW OF SYSTEMS:   See history of present illness  ALL OTHER ROS ARE NEGATIVE    VITAL SIGNs BP 120/84 (BP Location: Left Arm, Cuff Size: Normal)   Pulse 78   Resp 16   Ht 5\' 4"  (1.626 m)   Wt 174 lb (78.9 kg)   SpO2 97%   BMI 29.87 kg/m   Physical Examination:   GENERAL:NAD, no fevers, chills, no weakness no fatigue HEAD: Normocephalic, atraumatic.  EYES: Pupils equal, round, reactive to light. Extraocular muscles intact. No scleral icterus.  MOUTH: Moist mucosal membrane.   EAR, NOSE, THROAT: Clear without exudates. No external lesions. Malanpatti class 3 NECK: Supple.  No thyromegaly. No nodules. No JVD.  PULMONARY:CTA B/L no wheezes, no crackles, no rhonchi CARDIOVASCULAR: S1 and S2. Regular rate and rhythm. No murmurs, rubs, or gallops. No edema.  GASTROINTESTINAL: Soft, nontender, nondistended. No masses. Positive bowel sounds.  MUSCULOSKELETAL: No swelling, clubbing, or edema. Range of motion full in all extremities.  NEUROLOGIC: Cranial nerves II through XII are intact. No gross focal neurological deficits.  SKIN: No ulceration, lesions, rashes, or cyanosis. Skin warm and dry. Turgor intact.  PSYCHIATRIC: Mood, affect within normal limits. The patient is awake, alert and oriented x 3. Insight, judgment intact.      ASSESSMENT / PLAN: 66 yo pleasant white female with excessive daytime sleepiness and fatigue with very loud snoring findings to suggest Sleep apnea-patient with officially diagnosis of obstructive sleep apnea with an AHI of 12.8 and after further treatment and auto CPAP therapy her AHI is now 2.4  #1 continue auto CPAP therapy 8-12 cm of water pressure #2 deconditioned state patient advised to increase activity and exercise tolerance   Patient satisfied with Plan of action and management. All questions answered  Corrin Parker, M.D.  [facility]  Medical Director [place] Director [facility] Cardio-Pulmonary Department

## 2016-11-03 NOTE — Telephone Encounter (Signed)
Ambien called into [facility]. They states the 6.25mg  will have to be ordered and not ready until Monday. Called pt and informed her of their response. Nothing further needed.

## 2016-11-03 NOTE — Telephone Encounter (Signed)
Patient states pharmacy has not received rx for [employer]

## 2016-11-03 NOTE — Patient Instructions (Signed)
Continue auto CPAP as prescribed 

## 2016-11-13 ENCOUNTER — Telehealth: Payer: Self-pay | Admitting: Family Medicine

## 2016-11-13 DIAGNOSIS — E7800 Pure hypercholesterolemia, unspecified: Secondary | ICD-10-CM

## 2016-11-13 DIAGNOSIS — I10 Essential (primary) hypertension: Secondary | ICD-10-CM

## 2016-11-13 DIAGNOSIS — R739 Hyperglycemia, unspecified: Secondary | ICD-10-CM

## 2016-11-13 NOTE — Telephone Encounter (Signed)
-----   Message from Eustace Pen, LPN sent at 25/85/2778  3:42 PM EDT ----- Regarding: Labs 10/17 Lab orders needed. Thank you.  Insurance:  [employer]

## 2016-11-15 ENCOUNTER — Ambulatory Visit (INDEPENDENT_AMBULATORY_CARE_PROVIDER_SITE_OTHER): Payer: Medicare HMO

## 2016-11-15 VITALS — BP 112/90 | HR 75 | Temp 98.0°F | Ht 63.75 in | Wt 173.2 lb

## 2016-11-15 DIAGNOSIS — E7800 Pure hypercholesterolemia, unspecified: Secondary | ICD-10-CM

## 2016-11-15 DIAGNOSIS — I10 Essential (primary) hypertension: Secondary | ICD-10-CM

## 2016-11-15 DIAGNOSIS — Z23 Encounter for immunization: Secondary | ICD-10-CM

## 2016-11-15 DIAGNOSIS — R739 Hyperglycemia, unspecified: Secondary | ICD-10-CM

## 2016-11-15 DIAGNOSIS — Z0000 Encounter for general adult medical examination without abnormal findings: Secondary | ICD-10-CM

## 2016-11-15 LAB — CBC WITH DIFFERENTIAL/PLATELET
BASOS PCT: 0.6 % (ref 0.0–3.0)
Basophils Absolute: 0 10*3/uL (ref 0.0–0.1)
EOS ABS: 0.1 10*3/uL (ref 0.0–0.7)
EOS PCT: 2.5 % (ref 0.0–5.0)
HEMATOCRIT: 41.9 % (ref 36.0–46.0)
HEMOGLOBIN: 13.8 g/dL (ref 12.0–15.0)
LYMPHS PCT: 37.1 % (ref 12.0–46.0)
Lymphs Abs: 2 10*3/uL (ref 0.7–4.0)
MCHC: 32.9 g/dL (ref 30.0–36.0)
MCV: 91.4 fl (ref 78.0–100.0)
Monocytes Absolute: 0.4 10*3/uL (ref 0.1–1.0)
Monocytes Relative: 6.4 % (ref 3.0–12.0)
Neutro Abs: 2.9 10*3/uL (ref 1.4–7.7)
Neutrophils Relative %: 53.4 % (ref 43.0–77.0)
Platelets: 267 10*3/uL (ref 150.0–400.0)
RBC: 4.58 Mil/uL (ref 3.87–5.11)
RDW: 14.1 % (ref 11.5–15.5)
WBC: 5.5 10*3/uL (ref 4.0–10.5)

## 2016-11-15 LAB — COMPREHENSIVE METABOLIC PANEL
ALBUMIN: 4.6 g/dL (ref 3.5–5.2)
ALK PHOS: 69 U/L (ref 39–117)
ALT: 17 U/L (ref 0–35)
AST: 17 U/L (ref 0–37)
BILIRUBIN TOTAL: 0.5 mg/dL (ref 0.2–1.2)
BUN: 14 mg/dL (ref 6–23)
CALCIUM: 9.9 mg/dL (ref 8.4–10.5)
CHLORIDE: 99 meq/L (ref 96–112)
CO2: 32 mEq/L (ref 19–32)
CREATININE: 1.07 mg/dL (ref 0.40–1.20)
GFR: 54.15 mL/min — ABNORMAL LOW (ref >60.00)
Glucose, Bld: 113 mg/dL — ABNORMAL HIGH (ref 70–99)
Potassium: 4.1 mEq/L (ref 3.5–5.1)
Sodium: 139 mEq/L (ref 135–145)
TOTAL PROTEIN: 7.6 g/dL (ref 6.0–8.3)

## 2016-11-15 LAB — LIPID PANEL
CHOLESTEROL: 221 mg/dL — AB (ref 0–200)
HDL: 63.6 mg/dL (ref >39.00)
LDL CALC: 118 mg/dL — AB (ref 0–99)
NonHDL: 157.7
TRIGLYCERIDES: 197 mg/dL — AB (ref 0.0–149.0)
Total CHOL/HDL Ratio: 3
VLDL: 39.4 mg/dL (ref 0.0–40.0)

## 2016-11-15 LAB — TSH: TSH: 0.98 u[IU]/mL (ref 0.35–4.50)

## 2016-11-15 LAB — HEMOGLOBIN A1C: HEMOGLOBIN A1C: 6.5 % (ref 4.6–6.5)

## 2016-11-15 NOTE — Progress Notes (Signed)
Pre visit review using our clinic review tool, if applicable. No additional management support is needed unless otherwise documented below in the visit note. 

## 2016-11-15 NOTE — Progress Notes (Signed)
Subjective:   Connie Rowe is a 66 y.o. female who presents for Medicare Annual (Subsequent) preventive examination.  Review of Systems:  N/A Cardiac Risk Factors include: advanced age (>30men, >81 women);hypertension;dyslipidemia     Objective:     Vitals: BP 112/90 (BP Location: Right Arm, Patient Position: Sitting, Cuff Size: Normal)   Pulse 75   Temp 98 F (36.7 C) (Oral)   Ht 5' 3.75" (1.619 m) Comment: no shoes  Wt 173 lb 4 oz (78.6 kg)   SpO2 93%   BMI 29.97 kg/m   Body mass index is 29.97 kg/m.   Tobacco History  Smoking Status  . Never Smoker  Smokeless Tobacco  . Never Used     Counseling given: No   Past Medical History:  Diagnosis Date  . Arthritis   . Colon polyps   . Depression   . Esophageal stricture   . Falls   . GERD (gastroesophageal reflux disease)   . H/O fracture of humerus 2006  . History of bronchitis   . History of hiatal hernia   . Hyperlipidemia   . Hypertension   . Nocturia   . Osteopenia   . Pancreatitis    History of   Past Surgical History:  Procedure Laterality Date  . BLADDER REPAIR    . COLONOSCOPY    . DIAGNOSTIC LAPAROSCOPY     tubal ligation  . DILATION AND CURETTAGE OF UTERUS    . ESOPHAGEAL DILATION    . LUMBAR LAMINECTOMY/DECOMPRESSION MICRODISCECTOMY Right 09/23/2014   Procedure: Laminectomy and Foraminotomy - Lumbar three- lumbar four - right ;  Surgeon: Eustace Moore, MD;  Location: [facility];  Service: Neurosurgery;  Laterality: Right;  . MULTIPLE TOOTH EXTRACTIONS    . TONSILLECTOMY    . TOTAL HIP ARTHROPLASTY Right 03/30/2015   Procedure: RIGHT TOTAL HIP ARTHROPLASTY ANTERIOR APPROACH;  Surgeon: Paralee Cancel, MD;  Location: [facility];  Service: Orthopedics;  Laterality: Right;  . TUBAL LIGATION    . UPPER GI ENDOSCOPY     Family History  Problem Relation Age of Onset  . Stroke Mother   . Hypertension Mother   . Cancer Mother        colon CA  . Colon cancer Mother   . Drug abuse Son   .  Stroke Brother   . Colon polyps Neg Hx   . Crohn's disease Neg Hx   . Pancreatic cancer Neg Hx   . Rectal cancer Neg Hx   . Stomach cancer Neg Hx   . Ulcerative colitis Neg Hx   . Breast cancer Neg Hx    History  Sexual Activity  . Sexual activity: Yes    Outpatient Encounter Prescriptions as of 11/15/2016  Medication Sig  . Calcium Carbonate-Vitamin D (CALCIUM-VITAMIN D) 500-200 MG-UNIT per tablet Take 1 tablet by mouth daily.    . diphenhydrAMINE (SOMINEX) 25 MG tablet Take 25 mg by mouth at bedtime as needed for sleep.  Marland Kitchen docusate sodium (COLACE) 100 MG capsule Take 1 capsule (100 mg total) by mouth 2 (two) times daily.  Marland Kitchen FLUoxetine (PROZAC) 20 MG capsule Take 1 capsule (20 mg total) by mouth daily. Needs to schedule physical for future refills  . hydrochlorothiazide (MICROZIDE) 12.5 MG capsule Take 1 capsule (12.5 mg total) by mouth daily.  . pantoprazole (PROTONIX) 40 MG tablet Take 1 tablet (40 mg total) by mouth 2 (two) times daily. Needs to schedule physical for future refills  . polyethylene glycol (MIRALAX /  GLYCOLAX) packet Take 17 g by mouth 2 (two) times daily.  . pravastatin (PRAVACHOL) 80 MG tablet Take 1 tablet (80 mg total) by mouth daily. Needs to schedule physical for future refills  . [DISCONTINUED] zolpidem (AMBIEN CR) 6.25 MG CR tablet Take 1 tablet (6.25 mg total) by mouth at bedtime as needed for sleep.   No facility-administered encounter medications on file as of 11/15/2016.     Activities of Daily Living In your present state of health, do you have any difficulty performing the following activities: 11/15/2016  Hearing? N  Vision? N  Difficulty concentrating or making decisions? N  Walking or climbing stairs? Y  Comment hip pain when climbing stairs  Dressing or bathing? N  Doing errands, shopping? N  Preparing Food and eating ? N  Using the Toilet? N  In the past six months, have you accidently leaked urine? Y  Comment sneezing causes leakage  Do  you have problems with loss of bowel control? N  Managing your Medications? N  Managing your Finances? N  Housekeeping or managing your Housekeeping? N  Some recent data might be hidden    Patient Care Team: Tower, Wynelle Fanny, MD as PCP - General    Assessment:     Hearing Screening   125Hz  250Hz  500Hz  1000Hz  2000Hz  3000Hz  4000Hz  6000Hz  8000Hz   Right ear:   40 40 40  40    Left ear:   40 40 40  40      Visual Acuity Screening   Right eye Left eye Both eyes  Without correction: 20/20 20/20 20/15-1  With correction:       Exercise Activities and Dietary recommendations Current Exercise Habits: Structured exercise class (Curves), Time (Minutes): 60, Frequency (Times/Week): 3, Weekly Exercise (Minutes/Week): 180, Intensity: Moderate, Exercise limited by: None identified  Goals    . Increase physical activity          Starting 11/15/2016, I will continue to exercise for at least 60 min 3 days per week.       Fall Risk Fall Risk  11/15/2016 10/15/2015 06/03/2014  Falls in the past year? No No Yes  Number falls in past yr: - - 1  Injury with Fall? - - No   Depression Screen PHQ 2/9 Scores 11/15/2016 10/15/2015 06/03/2014  PHQ - 2 Score 0 0 0  PHQ- 9 Score 0 - -     Cognitive Function MMSE - Mini Mental State Exam 11/15/2016 10/15/2015  Orientation to time 5 5  Orientation to Place 5 5  Registration 3 3  Attention/ Calculation 0 0  Recall 3 3  Language- name 2 objects 0 0  Language- repeat 1 1  Language- follow 3 step command 3 3  Language- read & follow direction 0 0  Write a sentence 0 0  Copy design 0 0  Total score 20 20       PLEASE NOTE: A Mini-Cog screen was completed. Maximum score is 20. A value of 0 denotes this part of Folstein MMSE was not completed or the patient failed this part of the Mini-Cog screening.   Mini-Cog Screening Orientation to Time - Max 5 pts Orientation to Place - Max 5 pts Registration - Max 3 pts Recall - Max 3 pts Language Repeat -  Max 1 pts Language Follow 3 Step Command - Max 3 pts   Immunization History  Administered Date(s) Administered  . Influenza Split 11/17/2010, 12/14/2011  . Influenza Whole 12/07/2006, 11/05/2007, 11/10/2008, 01/05/2010  .  Influenza,inj,Quad PF,6+ Mos 12/17/2012, 12/16/2013, 12/31/2014, 10/15/2015, 11/15/2016  . Pneumococcal Conjugate-13 06/03/2014  . Pneumococcal Polysaccharide-23 10/15/2015  . Td 10/01/1995, 10/04/2006  . Zoster 09/29/2013   Screening Tests Health Maintenance  Topic Date Due  . TETANUS/TDAP  10/03/2026 (Originally 10/03/2016)  . MAMMOGRAM  02/24/2017  . COLONOSCOPY  02/14/2025  . INFLUENZA VACCINE  Completed  . DEXA SCAN  Completed  . Hepatitis C Screening  Completed  . PNA vac Low Risk Adult  Completed      Plan:   I have personally reviewed and addressed the Medicare Annual Wellness questionnaire and have noted the following in the patient's chart:  A. Medical and social history B. Use of alcohol, tobacco or illicit drugs  C. Current medications and supplements D. Functional ability and status E.  Nutritional status F.  Physical activity G. Advance directives H. List of other physicians I.  Hospitalizations, surgeries, and ER visits in previous 12 months J.  [place] to include hearing, vision, cognitive, depression L. Referrals and appointments - none  In addition, I have reviewed and discussed with patient certain preventive protocols, quality metrics, and best practice recommendations. A written personalized care plan for preventive services as well as general preventive health recommendations were provided to patient.  See attached scanned questionnaire for additional information.   Signed,   Lindell Noe, MHA, BS, LPN Health Coach

## 2016-11-15 NOTE — Patient Instructions (Signed)
Connie Rowe , Thank you for taking time to come for your Medicare Wellness Visit. I appreciate your ongoing commitment to your health goals. Please review the following plan we discussed and let me know if I can assist you in the future.   These are the goals we discussed: Goals    . Increase physical activity          Starting 11/15/2016, I will continue to exercise for at least 60 min 3 days per week.        This is a list of the screening recommended for you and due dates:  Health Maintenance  Topic Date Due  . Tetanus Vaccine  10/03/2026*  . Mammogram  02/24/2017  . Colon Cancer Screening  02/14/2025  . Flu Shot  Completed  . DEXA scan (bone density measurement)  Completed  .  Hepatitis C: One time screening is recommended by Center for Disease Control  (CDC) for  adults born from [birth year] through 1967.   Completed  . Pneumonia vaccines  Completed  *Topic was postponed. The date shown is not the original due date.   Preventive Care for Adults  A healthy lifestyle and preventive care can promote health and wellness. Preventive health guidelines for adults include the following key practices.  . A routine yearly physical is a good way to check with your health care provider about your health and preventive screening. It is a chance to share any concerns and updates on your health and to receive a thorough exam.  . Visit your dentist for a routine exam and preventive care every 6 months. Brush your teeth twice a day and floss once a day. Good oral hygiene prevents tooth decay and gum disease.  . The frequency of eye exams is based on your age, health, family medical history, use  of contact lenses, and other factors. Follow your health care provider's ecommendations for frequency of eye exams.  . Eat a healthy diet. Foods like vegetables, fruits, whole grains, low-fat dairy products, and lean protein foods contain the nutrients you need without too many calories. Decrease your intake  of foods high in solid fats, added sugars, and salt. Eat the right amount of calories for you. Get information about a proper diet from your health care provider, if necessary.  . Regular physical exercise is one of the most important things you can do for your health. Most adults should get at least 150 minutes of moderate-intensity exercise (any activity that increases your heart rate and causes you to sweat) each week. In addition, most adults need muscle-strengthening exercises on 2 or more days a week.  Silver Sneakers may be a benefit available to you. To determine eligibility, you may visit the website: www.silversneakers.com or contact program at 914-875-9617 Mon-Fri between 8AM-8PM.   . Maintain a healthy weight. The body mass index (BMI) is a screening tool to identify possible weight problems. It provides an estimate of body fat based on height and weight. Your health care provider can find your BMI and can help you achieve or maintain a healthy weight.   For adults 20 years and older: ? A BMI below 18.5 is considered underweight. ? A BMI of 18.5 to 24.9 is normal. ? A BMI of 25 to 29.9 is considered overweight. ? A BMI of 30 and above is considered obese.   . Maintain normal blood lipids and cholesterol levels by exercising and minimizing your intake of saturated fat. Eat a balanced diet with plenty of  fruit and vegetables. Blood tests for lipids and cholesterol should begin at age 27 and be repeated every 5 years. If your lipid or cholesterol levels are high, you are over 50, or you are at high risk for heart disease, you may need your cholesterol levels checked more frequently. Ongoing high lipid and cholesterol levels should be treated with medicines if diet and exercise are not working.  . If you smoke, find out from your health care provider how to quit. If you do not use tobacco, please do not start.  . If you choose to drink alcohol, please do not consume more than 2 drinks  per day. One drink is considered to be 12 ounces (355 mL) of beer, 5 ounces (148 mL) of wine, or 1.5 ounces (44 mL) of liquor.  . If you are 12-32 years old, ask your health care provider if you should take aspirin to prevent strokes.  . Use sunscreen. Apply sunscreen liberally and repeatedly throughout the day. You should seek shade when your shadow is shorter than you. Protect yourself by wearing long sleeves, pants, a wide-brimmed hat, and sunglasses year round, whenever you are outdoors.  . Once a month, do a whole body skin exam, using a mirror to look at the skin on your back. Tell your health care provider of new moles, moles that have irregular borders, moles that are larger than a pencil eraser, or moles that have changed in shape or color.

## 2016-11-15 NOTE — Progress Notes (Signed)
I reviewed health advisor's note, was available for consultation, and agree with documentation and plan.  

## 2016-11-15 NOTE — Progress Notes (Signed)
PCP notes:   Health maintenance:  Flu vaccine - administered Tetanus - postponed/insurance  Abnormal screenings:   None  Patient concerns:   Patient reports future ortho consult to discuss possible left hip replacement.  Nurse concerns:  None  Next PCP appt:   11/27/16 @1015 

## 2016-11-19 DIAGNOSIS — G4733 Obstructive sleep apnea (adult) (pediatric): Secondary | ICD-10-CM | POA: Diagnosis not present

## 2016-11-19 DIAGNOSIS — M169 Osteoarthritis of hip, unspecified: Secondary | ICD-10-CM | POA: Diagnosis not present

## 2016-11-21 ENCOUNTER — Other Ambulatory Visit: Payer: Self-pay | Admitting: Family Medicine

## 2016-11-21 ENCOUNTER — Encounter: Payer: Medicare HMO | Admitting: Family Medicine

## 2016-11-22 DIAGNOSIS — Z96641 Presence of right artificial hip joint: Secondary | ICD-10-CM | POA: Diagnosis not present

## 2016-11-22 DIAGNOSIS — Z471 Aftercare following joint replacement surgery: Secondary | ICD-10-CM | POA: Diagnosis not present

## 2016-11-22 DIAGNOSIS — M1612 Unilateral primary osteoarthritis, left hip: Secondary | ICD-10-CM | POA: Diagnosis not present

## 2016-11-22 DIAGNOSIS — M25552 Pain in left hip: Secondary | ICD-10-CM | POA: Diagnosis not present

## 2016-11-27 ENCOUNTER — Ambulatory Visit (INDEPENDENT_AMBULATORY_CARE_PROVIDER_SITE_OTHER): Payer: Medicare HMO | Admitting: Family Medicine

## 2016-11-27 ENCOUNTER — Encounter: Payer: Self-pay | Admitting: Family Medicine

## 2016-11-27 VITALS — BP 136/90 | HR 85 | Temp 98.2°F | Ht 63.75 in | Wt 173.8 lb

## 2016-11-27 DIAGNOSIS — M899 Disorder of bone, unspecified: Secondary | ICD-10-CM | POA: Diagnosis not present

## 2016-11-27 DIAGNOSIS — F5101 Primary insomnia: Secondary | ICD-10-CM

## 2016-11-27 DIAGNOSIS — M949 Disorder of cartilage, unspecified: Secondary | ICD-10-CM

## 2016-11-27 DIAGNOSIS — R7303 Prediabetes: Secondary | ICD-10-CM

## 2016-11-27 DIAGNOSIS — G4700 Insomnia, unspecified: Secondary | ICD-10-CM | POA: Insufficient documentation

## 2016-11-27 DIAGNOSIS — Z01810 Encounter for preprocedural cardiovascular examination: Secondary | ICD-10-CM

## 2016-11-27 DIAGNOSIS — Z0000 Encounter for general adult medical examination without abnormal findings: Secondary | ICD-10-CM

## 2016-11-27 DIAGNOSIS — E7800 Pure hypercholesterolemia, unspecified: Secondary | ICD-10-CM | POA: Diagnosis not present

## 2016-11-27 DIAGNOSIS — Z01818 Encounter for other preprocedural examination: Secondary | ICD-10-CM | POA: Diagnosis not present

## 2016-11-27 DIAGNOSIS — I10 Essential (primary) hypertension: Secondary | ICD-10-CM | POA: Diagnosis not present

## 2016-11-27 MED ORDER — FLUOXETINE HCL 20 MG PO CAPS: 20.0000 mg | ORAL_CAPSULE | Freq: Every day | ORAL | 3 refills | Status: DC

## 2016-11-27 MED ORDER — PANTOPRAZOLE SODIUM 40 MG PO TBEC: 40.0000 mg | DELAYED_RELEASE_TABLET | Freq: Two times a day (BID) | ORAL | 3 refills | Status: DC

## 2016-11-27 MED ORDER — HYDROCHLOROTHIAZIDE 12.5 MG PO CAPS: 12.5000 mg | ORAL_CAPSULE | Freq: Every day | ORAL | 3 refills | Status: DC

## 2016-11-27 MED ORDER — PRAVASTATIN SODIUM 80 MG PO TABS: 80.0000 mg | ORAL_TABLET | Freq: Every day | ORAL | 3 refills | Status: DC

## 2016-11-27 MED ORDER — TRAZODONE HCL 50 MG PO TABS: 25.0000 mg | ORAL_TABLET | Freq: Every evening | ORAL | 3 refills | Status: DC | PRN

## 2016-11-27 NOTE — Assessment & Plan Note (Signed)
Primary and life long On cpap  Disc sleep hygeine  Caffeine avoidance  Trial of trazadone 50  Disc poss side eff   Discussed expectations of this medication including time to effectiveness and mechanism of action, also poss of side effects (early and late)- including mental fuzziness, weight or appetite change, nausea and poss of worse dep or anxiety (even suicidal thoughts)  Pt voiced understanding and will stop med and update if this occurs    She was intol of [place]

## 2016-11-27 NOTE — Progress Notes (Signed)
Subjective:    Patient ID: Connie Rowe, female    DOB: 20-Mar-1950, 66 y.o.   MRN: [id number]  HPI  Here for health maintenance exam and to review chronic medical problems     Also need pre op examination for L THA  ([facility]) Had the R one done-did very well with it  EKG today NSR with rate of 80 No resp problems  No severe med allergies and no hx of anaphylaxis No nsaids or blood thinners   Even with cpap machine - cannot sleep  ambien given by pulmonary - impaired for memory  Wants to try trazadone     Wt Readings from Last 3 Encounters:  11/27/16 173 lb 12 oz (78.8 kg)  11/15/16 173 lb 4 oz (78.6 kg)  11/03/16 174 lb (78.9 kg)  stable  30.06 kg/m  amw was 10/17 Postponed Tetanus shot due to ins   (has newborn grand son- needs Tdap)  Had her flu shot  No concerns   Mammogram 1/18 nl  Self breast exam - no lumps   Colonoscopy 1/17 5 year recall (fam hx)  dexa 1/18 mild osteopenia  Taking ca and D  Cannot walk due to hip pain  She does exercise at [employer] (upper body)   zostavax 8/15   bp is stable today (higher than avg but in pain also)  No cp or palpitations or headaches or edema  No side effects to medicines  BP Readings from Last 3 Encounters:  11/27/16 136/90  11/15/16 112/90  11/03/16 120/84      Hyperglycemia Lab Results  Component Value Date   HGBA1C 6.5 11/15/2016  at borderline of DM She craves sweets  Up from 5.7   Hyperlipidemia Lab Results  Component Value Date   CHOL 221 (H) 11/15/2016   CHOL 183 10/15/2015   CHOL 240 (H) 06/03/2014   Lab Results  Component Value Date   HDL 63.60 11/15/2016   HDL 56.70 10/15/2015   HDL 72.30 06/03/2014   Lab Results  Component Value Date   LDLCALC 118 (H) 11/15/2016   LDLCALC 97 10/15/2015   LDLCALC 96 04/15/2013   Lab Results  Component Value Date   TRIG 197.0 (H) 11/15/2016   TRIG 145.0 10/15/2015   TRIG 228.0 (H) 06/03/2014   Lab Results  Component Value Date   CHOLHDL  3 11/15/2016   CHOLHDL 3 10/15/2015   CHOLHDL 3 06/03/2014   Lab Results  Component Value Date   LDLDIRECT 126.0 06/03/2014   LDLDIRECT 122.4 09/14/2010   LDLDIRECT 132.2 04/16/2008  pravastatin and diet  Diet has changed  LDL and trig up    Other labs ok  Results for orders placed or performed in visit on 11/15/16  CBC with Differential/Platelet  Result Value Ref Range   WBC 5.5 4.0 - 10.5 K/uL   RBC 4.58 3.87 - 5.11 Mil/uL   Hemoglobin 13.8 12.0 - 15.0 g/dL   HCT 41.9 36.0 - 46.0 %   MCV 91.4 78.0 - 100.0 fl   MCHC 32.9 30.0 - 36.0 g/dL   RDW 14.1 11.5 - 15.5 %   Platelets 267.0 150.0 - 400.0 K/uL   Neutrophils Relative % 53.4 43.0 - 77.0 %   Lymphocytes Relative 37.1 12.0 - 46.0 %   Monocytes Relative 6.4 3.0 - 12.0 %   Eosinophils Relative 2.5 0.0 - 5.0 %   Basophils Relative 0.6 0.0 - 3.0 %   Neutro Abs 2.9 1.4 - 7.7 K/uL  Lymphs Abs 2.0 0.7 - 4.0 K/uL   Monocytes Absolute 0.4 0.1 - 1.0 K/uL   Eosinophils Absolute 0.1 0.0 - 0.7 K/uL   Basophils Absolute 0.0 0.0 - 0.1 K/uL  Comprehensive metabolic panel  Result Value Ref Range   Sodium 139 135 - 145 mEq/L   Potassium 4.1 3.5 - 5.1 mEq/L   Chloride 99 96 - 112 mEq/L   CO2 32 19 - 32 mEq/L   Glucose, Bld 113 (H) 70 - 99 mg/dL   BUN 14 6 - 23 mg/dL   Creatinine, Ser 1.07 0.40 - 1.20 mg/dL   Total Bilirubin 0.5 0.2 - 1.2 mg/dL   Alkaline Phosphatase 69 39 - 117 U/L   AST 17 0 - 37 U/L   ALT 17 0 - 35 U/L   Total Protein 7.6 6.0 - 8.3 g/dL   Albumin 4.6 3.5 - 5.2 g/dL   Calcium 9.9 8.4 - 10.5 mg/dL   GFR 54.15 (L) >60.00 mL/min  Lipid panel  Result Value Ref Range   Cholesterol 221 (H) 0 - 200 mg/dL   Triglycerides 197.0 (H) 0.0 - 149.0 mg/dL   HDL 63.60 >39.00 mg/dL   VLDL 39.4 0.0 - 40.0 mg/dL   LDL Cholesterol 118 (H) 0 - 99 mg/dL   Total CHOL/HDL Ratio 3    NonHDL 157.70   TSH  Result Value Ref Range   TSH 0.98 0.35 - 4.50 uIU/mL  Hemoglobin A1c  Result Value Ref Range   Hgb A1c MFr Bld 6.5 4.6 -  6.5 %     Patient Active Problem List   Diagnosis Date Noted  . Insomnia 11/27/2016  . Daytime somnolence 05/23/2016  . Snoring 05/23/2016  . Estrogen deficiency 10/19/2015  . Need for hepatitis C screening test 09/27/2015  . S/P right THA, AA 03/30/2015  . Pre-operative examination 02/24/2015  . S/P lumbar laminectomy 09/23/2014  . Essential hypertension 06/26/2014  . Encounter for Medicare annual wellness exam 06/03/2014  . Colon cancer screening 06/03/2014  . Sciatica 11/26/2013  . Prediabetes 04/17/2013  . Hirsutism 12/06/2010  . Routine general medical examination at a health care facility 09/10/2010  . Other screening mammogram 09/01/2010  . Disorder of bone and cartilage 10/22/2006  . URINARY INCONTINENCE, STRESS 08/22/2006  . History of Helicobacter pylori infection 04/18/2006  . Hyperlipidemia 04/18/2006  . DEPRESSION 04/18/2006  . EXTERNAL HEMORRHOIDS 04/18/2006  . GERD 04/18/2006  . HIATAL HERNIA 04/18/2006  . OVERACTIVE BLADDER 04/18/2006  . HEMATURIA, MICROSCOPIC 04/18/2006  . GAMMA GLUTAMYL TRANSFERASE, SERUM, ELEVATED 04/18/2006  . PANCREATITIS, HX OF 04/18/2006  . DIVERTICULOSIS, COLON 01/02/2000   Past Medical History:  Diagnosis Date  . Arthritis   . Colon polyps   . Depression   . Esophageal stricture   . Falls   . GERD (gastroesophageal reflux disease)   . H/O fracture of humerus 2006  . History of bronchitis   . History of hiatal hernia   . Hyperlipidemia   . Hypertension   . Nocturia   . Osteopenia   . Pancreatitis    History of   Past Surgical History:  Procedure Laterality Date  . BLADDER REPAIR    . COLONOSCOPY    . DIAGNOSTIC LAPAROSCOPY     tubal ligation  . DILATION AND CURETTAGE OF UTERUS    . ESOPHAGEAL DILATION    . LUMBAR LAMINECTOMY/DECOMPRESSION MICRODISCECTOMY Right 09/23/2014   Procedure: Laminectomy and Foraminotomy - Lumbar three- lumbar four - right ;  Surgeon: Eustace Moore, MD;  Location: [facility];  Service:  Neurosurgery;  Laterality: Right;  . MULTIPLE TOOTH EXTRACTIONS    . TONSILLECTOMY    . TOTAL HIP ARTHROPLASTY Right 03/30/2015   Procedure: RIGHT TOTAL HIP ARTHROPLASTY ANTERIOR APPROACH;  Surgeon: Paralee Cancel, MD;  Location: [facility];  Service: Orthopedics;  Laterality: Right;  . TUBAL LIGATION    . UPPER GI ENDOSCOPY     Social History  Substance Use Topics  . Smoking status: Never Smoker  . Smokeless tobacco: Never Used  . Alcohol use No   Family History  Problem Relation Age of Onset  . Stroke Mother   . Hypertension Mother   . Cancer Mother        colon CA  . Colon cancer Mother   . Drug abuse Son   . Stroke Brother   . Colon polyps Neg Hx   . Crohn's disease Neg Hx   . Pancreatic cancer Neg Hx   . Rectal cancer Neg Hx   . Stomach cancer Neg Hx   . Ulcerative colitis Neg Hx   . Breast cancer Neg Hx    Allergies  Allergen Reactions  . Ace Inhibitors Cough  . Ambien [Zolpidem Tartrate]     Affected memory   Current Outpatient Prescriptions on File Prior to Visit  Medication Sig Dispense Refill  . Calcium Carbonate-Vitamin D (CALCIUM-VITAMIN D) 500-200 MG-UNIT per tablet Take 1 tablet by mouth daily.      . diphenhydrAMINE (SOMINEX) 25 MG tablet Take 25 mg by mouth at bedtime as needed for sleep.    Marland Kitchen docusate sodium (COLACE) 100 MG capsule Take 1 capsule (100 mg total) by mouth 2 (two) times daily. 10 capsule 0  . polyethylene glycol (MIRALAX / GLYCOLAX) packet Take 17 g by mouth 2 (two) times daily. 14 each 0   No current facility-administered medications on file prior to visit.      Review of Systems  Constitutional: Positive for fatigue. Negative for activity change, appetite change, fever and unexpected weight change.  HENT: Negative for congestion, ear pain, rhinorrhea, sinus pressure and sore throat.   Eyes: Negative for pain, redness and visual disturbance.  Respiratory: Negative for cough, shortness of breath and wheezing.   Cardiovascular: Negative for  chest pain and palpitations.  Gastrointestinal: Negative for abdominal pain, blood in stool, constipation and diarrhea.  Endocrine: Negative for polydipsia and polyuria.  Genitourinary: Negative for dysuria, frequency and urgency.  Musculoskeletal: Negative for arthralgias, back pain and myalgias.  Skin: Negative for pallor and rash.  Allergic/Immunologic: Negative for environmental allergies.  Neurological: Negative for dizziness, syncope and headaches.  Hematological: Negative for adenopathy. Does not bruise/bleed easily.  Psychiatric/Behavioral: Positive for sleep disturbance. Negative for decreased concentration and dysphoric mood. The patient is not nervous/anxious.        Objective:   Physical Exam  Constitutional: She appears well-developed and well-nourished. No distress.  Breast exam: No mass, nodules, thickening, tenderness, bulging, retraction, inflamation, nipple discharge or skin changes noted.  No axillary or clavicular LA.      HENT:  Head: Normocephalic and atraumatic.  Right Ear: External ear normal.  Left Ear: External ear normal.  Mouth/Throat: Oropharynx is clear and moist.  Eyes: Pupils are equal, round, and reactive to light. Conjunctivae and EOM are normal. No scleral icterus.  Neck: Normal range of motion. Neck supple. No JVD present. Carotid bruit is not present. No thyromegaly present.  Cardiovascular: Normal rate, regular rhythm, normal heart sounds and intact distal pulses.  Exam  reveals no gallop.   Pulmonary/Chest: Effort normal and breath sounds normal. No respiratory distress. She has no wheezes. She exhibits no tenderness.  Abdominal: Soft. Bowel sounds are normal. She exhibits no distension, no abdominal bruit and no mass. There is no tenderness.  Genitourinary: No breast swelling, tenderness, discharge or bleeding.  Genitourinary Comments: Breast exam: No mass, nodules, thickening, tenderness, bulging, retraction, inflamation, nipple discharge or skin  changes noted.  No axillary or clavicular LA.      Musculoskeletal: She exhibits no edema or tenderness.  Limited rom LS  No kyphosis   Lymphadenopathy:    She has no cervical adenopathy.  Neurological: She is alert. She has normal reflexes. No cranial nerve deficit. She exhibits normal muscle tone. Coordination normal.  Skin: Skin is warm and dry. No rash noted. No erythema. No pallor.  Solar lentigines diffusely   Psychiatric: She has a normal mood and affect.          Assessment & Plan:   Problem List Items Addressed This Visit      Cardiovascular and Mediastinum   Essential hypertension    bp in fair control at this time  BP Readings from Last 1 Encounters:  11/27/16 136/90   No changes needed Disc lifstyle change with low sodium diet and exercise  Labs reviewed  Diastolic slt up -watching  In pain from hip today      Relevant Medications   pravastatin (PRAVACHOL) 80 MG tablet   hydrochlorothiazide (MICROZIDE) 12.5 MG capsule     Musculoskeletal and Integument   Disorder of bone and cartilage    Watching  dexa 1/18 mild bone loss On ca and D Exercising  No falls or fractures         Other   Hyperlipidemia   Relevant Medications   pravastatin (PRAVACHOL) 80 MG tablet   hydrochlorothiazide (MICROZIDE) 12.5 MG capsule   Insomnia    Primary and life long On cpap  Disc sleep hygeine  Caffeine avoidance  Trial of trazadone 50  Disc poss side eff   Discussed expectations of this medication including time to effectiveness and mechanism of action, also poss of side effects (early and late)- including mental fuzziness, weight or appetite change, nausea and poss of worse dep or anxiety (even suicidal thoughts)  Pt voiced understanding and will stop med and update if this occurs    She was intol of ambien      Pre-operative examination    No restrictions for upcoming ortho surg (L hip repl) Paperwork to Dr Alvan Dame Nl ekg  No resp/cardiac problems  No sig drug  allergies No blood thinners or nsaids to hold       Prediabetes    Lab Results  Component Value Date   HGBA1C 6.5 11/15/2016   This is up  disc imp of low glycemic diet and wt loss to prevent DM2 Given handouts  F/u 6 mo      Routine general medical examination at a health care facility    Reviewed health habits including diet and exercise and skin cancer prevention Reviewed appropriate screening tests for age  Also reviewed health mt list, fam hx and immunization status , as well as social and family history   See HPI Labs reviewed  Enc to loose wt and work on diet for prediabetes        Other Visit Diagnoses    Pre-operative cardiovascular examination    -  Primary   Relevant Orders   EKG 12-Lead (  Completed)

## 2016-11-27 NOTE — Assessment & Plan Note (Signed)
Lab Results  Component Value Date   HGBA1C 6.5 11/15/2016   This is up  disc imp of low glycemic diet and wt loss to prevent DM2 Given handouts  F/u 6 mo

## 2016-11-27 NOTE — Assessment & Plan Note (Signed)
No restrictions for upcoming ortho surg (L hip repl) Paperwork to Dr Alvan Dame Nl ekg  No resp/cardiac problems  No sig drug allergies No blood thinners or nsaids to hold

## 2016-11-27 NOTE — Assessment & Plan Note (Signed)
Watching  dexa 1/18 mild bone loss On ca and D Exercising  No falls or fractures

## 2016-11-27 NOTE — Patient Instructions (Addendum)
You are due for a tetanus shot-see the handout I gave you  You should get the Tdap due to your baby grandson   Let's try trazodone for sleep  If you feel bad or depressed with it stop it and let me know   For borderline diabetes Try to get most of your carbohydrates from produce (with the exception of white potatoes)  Eat less bread/pasta/rice/snack foods/cereals/sweets and other items from the middle of the grocery store (processed carbs) Get more lean protein   I will get your paperwork to Dr Alvan Dame for your surgery

## 2016-11-27 NOTE — Assessment & Plan Note (Signed)
Reviewed health habits including diet and exercise and skin cancer prevention Reviewed appropriate screening tests for age  Also reviewed health mt list, fam hx and immunization status , as well as social and family history   See HPI Labs reviewed  Enc to loose wt and work on diet for prediabetes

## 2016-11-27 NOTE — Assessment & Plan Note (Signed)
bp in fair control at this time  BP Readings from Last 1 Encounters:  11/27/16 136/90   No changes needed Disc lifstyle change with low sodium diet and exercise  Labs reviewed  Diastolic slt up -watching  In pain from hip today

## 2016-12-14 IMAGING — CR DG CHEST 2V
2 series · 2 of 2 positions shown · non-contrast
Comparison: None in PACs

CLINICAL DATA: Nonproductive cough, no wheezing

EXAM:
CHEST  2 VIEW

[view not recorded (1 of 2)]
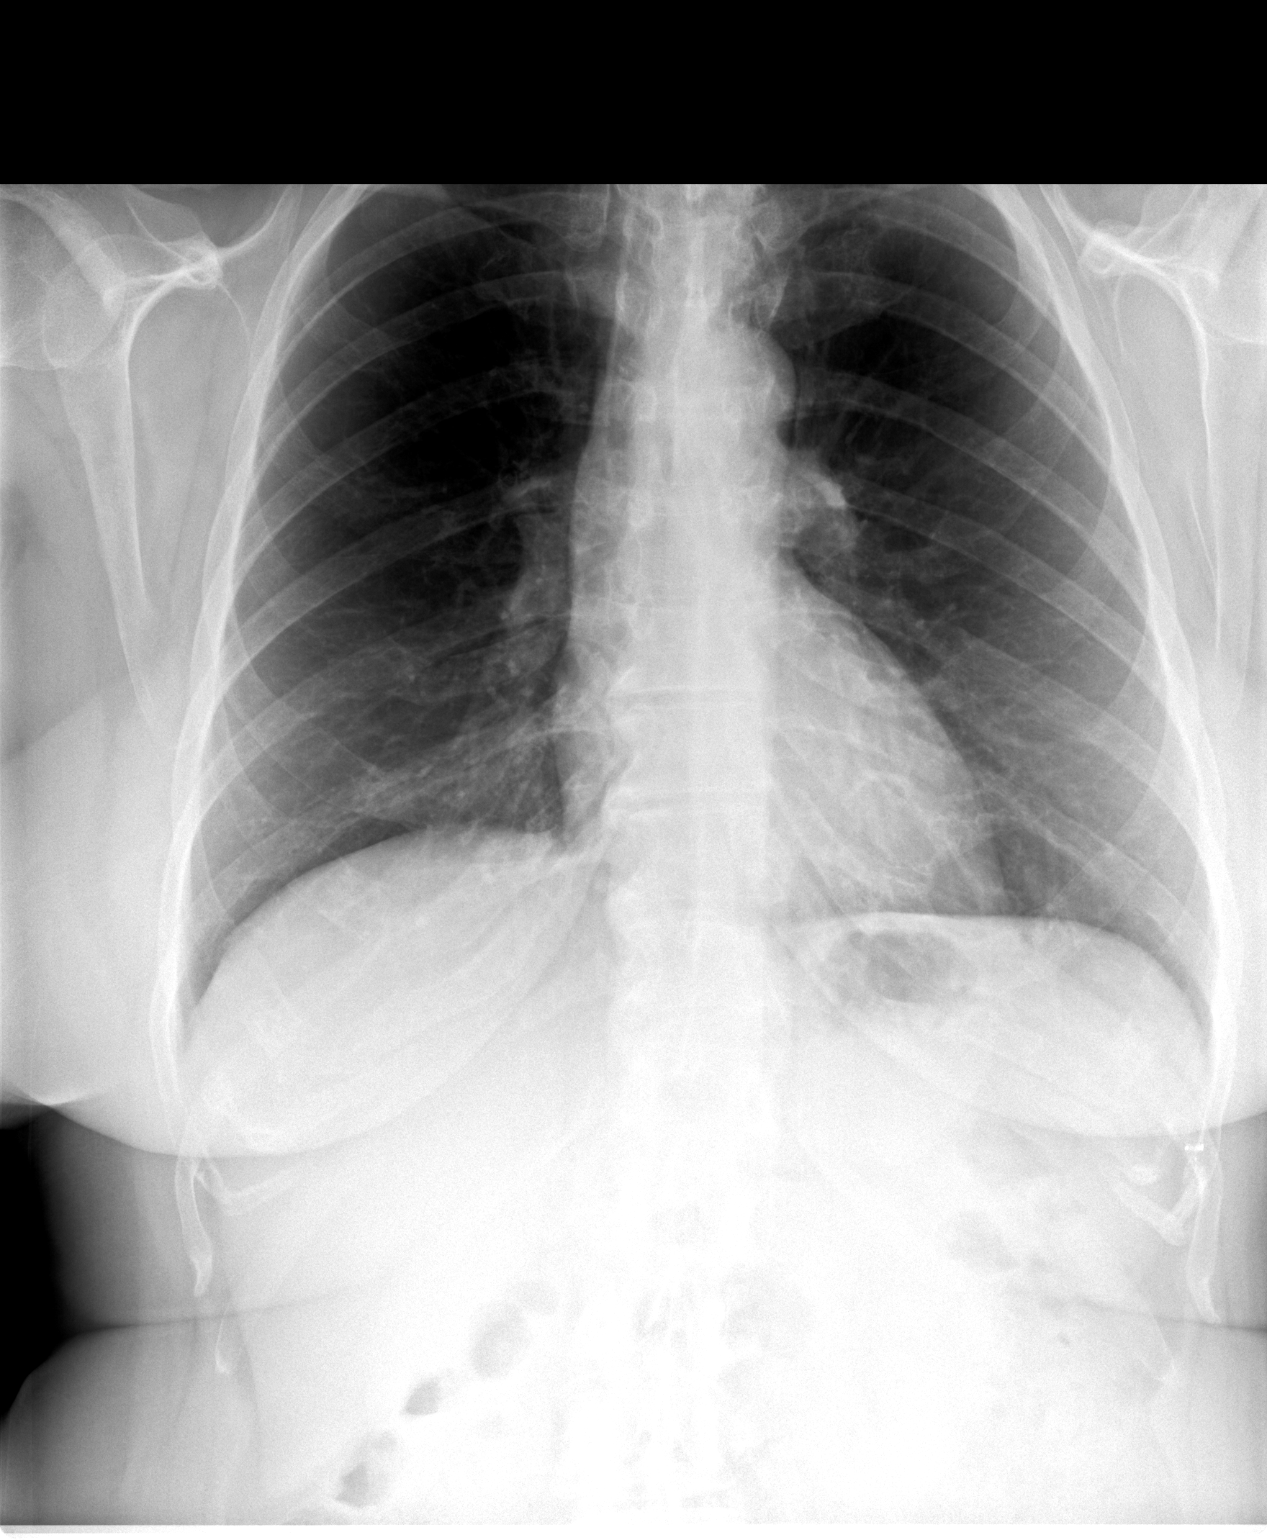

[view not recorded (2 of 2)]
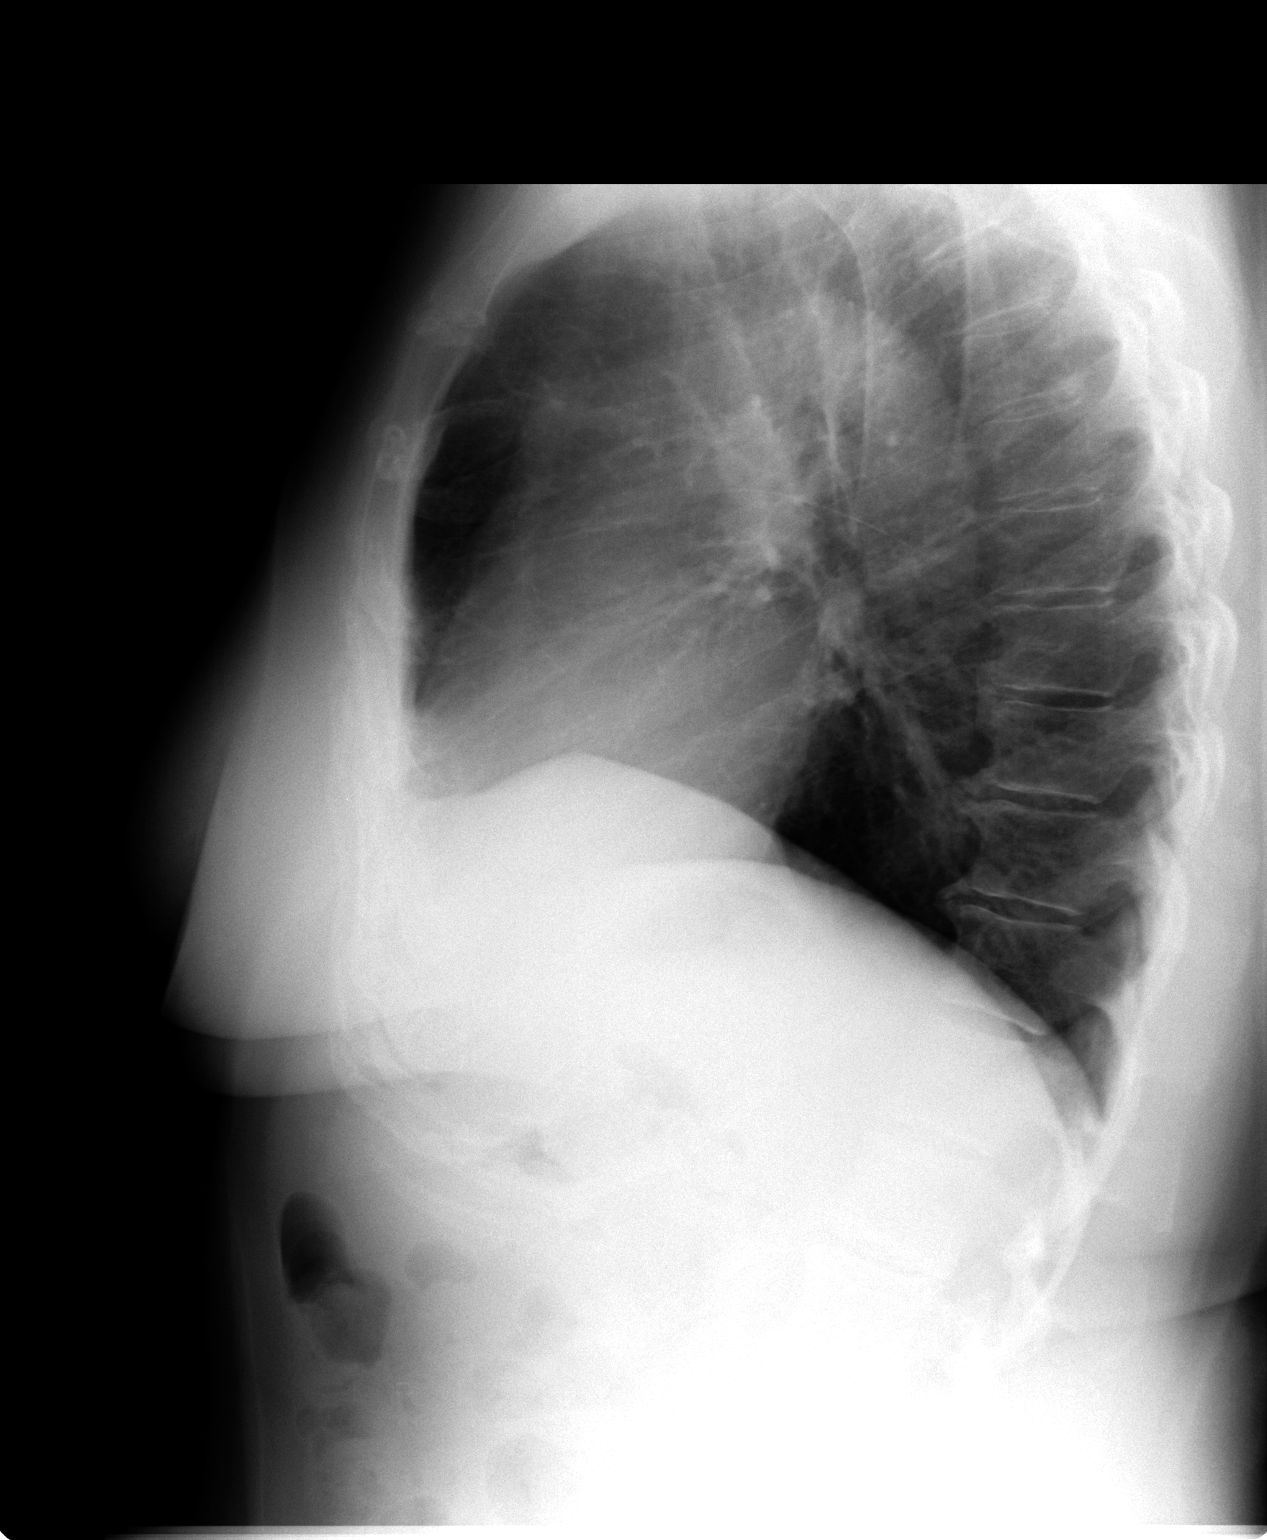

[2 of 2 positions shown; findings below may reference images not displayed]

FINDINGS: The lungs are adequately inflated. There is subtle increased density
in the right infrahilar region which not clearly evident on the
lateral image. The heart and pulmonary vascularity are normal. The
mediastinum is normal in width. There is no pleural effusion. The
bony thorax exhibits no acute abnormality.
IMPRESSION: Subsegmental atelectasis in the right infrahilar region is
suspected. This is an area of overlap with anterior and posterior
ribs and costal cartilages however.

Follow-up radiographs following anticipated antibiotic therapy are
recommended to reassess this region.

## 2016-12-20 DIAGNOSIS — M169 Osteoarthritis of hip, unspecified: Secondary | ICD-10-CM | POA: Diagnosis not present

## 2016-12-20 DIAGNOSIS — G4733 Obstructive sleep apnea (adult) (pediatric): Secondary | ICD-10-CM | POA: Diagnosis not present

## 2016-12-27 NOTE — H&P (Signed)
TOTAL HIP ADMISSION H&P  Patient is admitted for left total hip arthroplasty, anterior approach.  Subjective:  Chief Complaint:    Left hip primary OA / pain  HPI: Connie Rowe, 66 y.o. female, has a history of pain and functional disability in the left hip(s) due to arthritis and patient has failed non-surgical conservative treatments for greater than 12 weeks to include NSAID's and/or analgesics and activity modification.  Onset of symptoms was gradual starting ~1 years ago with gradually worsening course since that time.The patient noted prior procedures of the hip to include arthroplasty on the right hip on March 30, 2015 per Dr. Alvan Dame.  Patient currently rates pain in the left hip at 9 out of 10 with activity. Patient has night pain, worsening of pain with activity and weight bearing, trendelenberg gait, pain that interfers with activities of daily living and pain with passive range of motion. Patient has evidence of periarticular osteophytes and joint space narrowing by imaging studies. This condition presents safety issues increasing the risk of falls.  There is no current active infection.   Risks, benefits and expectations were discussed with the patient.  Risks including but not limited to the risk of anesthesia, blood clots, nerve damage, blood vessel damage, failure of the prosthesis, infection and up to and including death.  Patient understand the risks, benefits and expectations and wishes to proceed with surgery.   PCP: Abner Greenspan, MD  D/C Plans:       Home   Post-op Meds:       No Rx given   Tranexamic Acid:      To be given - IV   Decadron:      is to be given  FYI:     ASA  Norco  CPAP  DME:   Pt already has equipment  PT:     No PT   Patient Active Problem List   Diagnosis Date Noted  . Insomnia 11/27/2016  . Daytime somnolence 05/23/2016  . Snoring 05/23/2016  . Estrogen deficiency 10/19/2015  . Need for hepatitis C screening test 09/27/2015  . S/P  right THA, AA 03/30/2015  . Pre-operative examination 02/24/2015  . S/P lumbar laminectomy 09/23/2014  . Essential hypertension 06/26/2014  . Encounter for Medicare annual wellness exam 06/03/2014  . Colon cancer screening 06/03/2014  . Sciatica 11/26/2013  . Prediabetes 04/17/2013  . Hirsutism 12/06/2010  . Routine general medical examination at a health care facility 09/10/2010  . Other screening mammogram 09/01/2010  . Disorder of bone and cartilage 10/22/2006  . URINARY INCONTINENCE, STRESS 08/22/2006  . History of Helicobacter pylori infection 04/18/2006  . Hyperlipidemia 04/18/2006  . DEPRESSION 04/18/2006  . EXTERNAL HEMORRHOIDS 04/18/2006  . GERD 04/18/2006  . HIATAL HERNIA 04/18/2006  . OVERACTIVE BLADDER 04/18/2006  . HEMATURIA, MICROSCOPIC 04/18/2006  . GAMMA GLUTAMYL TRANSFERASE, SERUM, ELEVATED 04/18/2006  . PANCREATITIS, HX OF 04/18/2006  . DIVERTICULOSIS, COLON 01/02/2000   Past Medical History:  Diagnosis Date  . Arthritis   . Colon polyps   . Depression   . Esophageal stricture   . Falls   . GERD (gastroesophageal reflux disease)   . H/O fracture of humerus 2006  . History of bronchitis   . History of hiatal hernia   . Hyperlipidemia   . Hypertension   . Nocturia   . Osteopenia   . Pancreatitis    History of    Past Surgical History:  Procedure Laterality Date  . BLADDER REPAIR    .  COLONOSCOPY    . DIAGNOSTIC LAPAROSCOPY     tubal ligation  . DILATION AND CURETTAGE OF UTERUS    . ESOPHAGEAL DILATION    . LUMBAR LAMINECTOMY/DECOMPRESSION MICRODISCECTOMY Right 09/23/2014   Procedure: Laminectomy and Foraminotomy - Lumbar three- lumbar four - right ;  Surgeon: Eustace Moore, MD;  Location: [name];  Service: Neurosurgery;  Laterality: Right;  . MULTIPLE TOOTH EXTRACTIONS    . TONSILLECTOMY    . TOTAL HIP ARTHROPLASTY Right 03/30/2015   Procedure: RIGHT TOTAL HIP ARTHROPLASTY ANTERIOR APPROACH;  Surgeon: Paralee Cancel, MD;  Location: [facility];   Service: Orthopedics;  Laterality: Right;  . TUBAL LIGATION    . UPPER GI ENDOSCOPY      No current facility-administered medications for this encounter.    Current Outpatient Medications  Medication Sig Dispense Refill Last Dose  . Calcium Carbonate-Vitamin D (CALCIUM-VITAMIN D) 500-200 MG-UNIT per tablet Take 1 tablet by mouth daily.     Taking  . diphenhydrAMINE (SOMINEX) 25 MG tablet Take 25 mg by mouth at bedtime as needed for sleep.   Taking  . docusate sodium (COLACE) 100 MG capsule Take 1 capsule (100 mg total) by mouth 2 (two) times daily. 10 capsule 0 Taking  . FLUoxetine (PROZAC) 20 MG capsule Take 1 capsule (20 mg total) by mouth daily. 90 capsule 3   . hydrochlorothiazide (MICROZIDE) 12.5 MG capsule Take 1 capsule (12.5 mg total) by mouth daily. 90 capsule 3   . pantoprazole (PROTONIX) 40 MG tablet Take 1 tablet (40 mg total) by mouth 2 (two) times daily. 180 tablet 3   . polyethylene glycol (MIRALAX / GLYCOLAX) packet Take 17 g by mouth 2 (two) times daily. 14 each 0 Taking  . pravastatin (PRAVACHOL) 80 MG tablet Take 1 tablet (80 mg total) by mouth daily. 90 tablet 3   . traZODone (DESYREL) 50 MG tablet Take 0.5-1 tablets (25-50 mg total) by mouth at bedtime as needed for sleep. 30 tablet 3    Allergies  Allergen Reactions  . Ace Inhibitors Cough  . Ambien [Zolpidem Tartrate]     Affected memory    Social History   Tobacco Use  . Smoking status: Never Smoker  . Smokeless tobacco: Never Used  Substance Use Topics  . Alcohol use: No    Alcohol/week: 0.0 oz    Family History  Problem Relation Age of Onset  . Stroke Mother   . Hypertension Mother   . Cancer Mother        colon CA  . Colon cancer Mother   . Drug abuse Son   . Stroke Brother   . Colon polyps Neg Hx   . Crohn's disease Neg Hx   . Pancreatic cancer Neg Hx   . Rectal cancer Neg Hx   . Stomach cancer Neg Hx   . Ulcerative colitis Neg Hx   . Breast cancer Neg Hx      Review of Systems   Constitutional: Negative.   HENT: Negative.   Eyes: Negative.   Respiratory: Negative.   Cardiovascular: Negative.   Gastrointestinal: Positive for heartburn.  Genitourinary: Positive for frequency.  Musculoskeletal: Positive for joint pain.  Skin: Negative.   Neurological: Negative.   Endo/Heme/Allergies: Negative.   Psychiatric/Behavioral: The patient has insomnia.     Objective:  Physical Exam  Constitutional: She is oriented to person, place, and time. She appears well-developed.  HENT:  Head: Normocephalic.  Eyes: Pupils are equal, round, and reactive to light.  Neck:  Neck supple. No JVD present. No tracheal deviation present. No thyromegaly present.  Cardiovascular: Normal rate, regular rhythm and intact distal pulses.  Respiratory: Effort normal and breath sounds normal. No respiratory distress. She has no wheezes.  GI: Soft. There is no tenderness. There is no guarding.  Musculoskeletal:       Left hip: She exhibits decreased range of motion, decreased strength, tenderness and bony tenderness. She exhibits no swelling, no deformity and no laceration.  Lymphadenopathy:    She has no cervical adenopathy.  Neurological: She is alert and oriented to person, place, and time.  Skin: Skin is warm and dry.  Psychiatric: She has a normal mood and affect.      Labs:  Estimated body mass index is 30.06 kg/m as calculated from the following:   Height as of 11/27/16: 5' 3.75" (1.619 m).   Weight as of 11/27/16: 78.8 kg (173 lb 12 oz).   Imaging Review Plain radiographs demonstrate severe degenerative joint disease of the left hip(s). The bone quality appears to be good for age and reported activity level.  Assessment/Plan:  End stage arthritis, left hip(s)  The patient history, physical examination, clinical judgement of the provider and imaging studies are consistent with end stage degenerative joint disease of the left hip(s) and total hip arthroplasty is deemed  medically necessary. The treatment options including medical management, injection therapy, arthroscopy and arthroplasty were discussed at length. The risks and benefits of total hip arthroplasty were presented and reviewed. The risks due to aseptic loosening, infection, stiffness, dislocation/subluxation,  thromboembolic complications and other imponderables were discussed.  The patient acknowledged the explanation, agreed to proceed with the plan and consent was signed. Patient is being admitted for inpatient treatment for surgery, pain control, PT, OT, prophylactic antibiotics, VTE prophylaxis, progressive ambulation and ADL's and discharge planning.The patient is planning to be discharged home.     West Pugh    PA-C  12/27/2016, 11:42 AM

## 2016-12-28 DIAGNOSIS — M169 Osteoarthritis of hip, unspecified: Secondary | ICD-10-CM | POA: Diagnosis not present

## 2016-12-28 DIAGNOSIS — G4733 Obstructive sleep apnea (adult) (pediatric): Secondary | ICD-10-CM | POA: Diagnosis not present

## 2017-01-03 NOTE — Progress Notes (Signed)
11-27-16 (Epic) EKG  11-15-16 (Epic) HGA1C

## 2017-01-03 NOTE — Patient Instructions (Addendum)
DEZIRE TURK  01/03/2017   Your procedure is scheduled on: 01-09-17  Report to [facility] Main  Entrance Report to Admitting at 11:50 AM   Call this number if you have problems the morning of surgery  [phone]   Remember: ONLY 1 PERSON MAY GO WITH YOU TO SHORT STAY TO GET  READY MORNING OF YOUR SURGERY.  Do not eat food or drink liquids :After Midnight.You may have a Clear Liquid Diet from Midnight until 8:20 AM. After 8:20 AM, nothing until after surgery     CLEAR LIQUID DIET   Foods Allowed                                                                     Foods Excluded  Coffee and tea, regular and decaf                             liquids that you cannot  Plain Jell-O in any flavor                                             see through such as: Fruit ices (not with fruit pulp)                                     milk, soups, orange juice  Iced Popsicles                                    All solid food Carbonated beverages, regular and diet                                    Cranberry, grape and apple juices Sports drinks like Gatorade Lightly seasoned clear broth or consume(fat free) Sugar, honey syrup  Sample Menu Breakfast                                Lunch                                     Supper Cranberry juice                    Beef broth                            Chicken broth Jell-O                                     Grape juice  Apple juice Coffee or tea                        Jell-O                                      Popsicle                                                Coffee or tea                        Coffee or tea  _____________________________________________________________________     Take these medicines the morning of surgery with A SIP OF WATER:None- Pt takes all medication in the evening.  You may also bring and use your eyedrops as needed.              You may not have any metal  on your body including hair pins and              piercings  Do not wear jewelry, make-up, lotions, powders or perfumes, deodorant             Do not wear nail polish.  Do not shave  48 hours prior to surgery.                 Do not bring valuables to the hospital. Day.  Contacts, dentures or bridgework may not be worn into surgery.  Leave suitcase in the car. After surgery it may be brought to your room.                Please read over the following fact sheets you were given: _____________________________________________________________________             [facility] - Preparing for Surgery Before surgery, you can play an important role.  Because skin is not sterile, your skin needs to be as free of germs as possible.  You can reduce the number of germs on your skin by washing with CHG (chlorahexidine gluconate) soap before surgery.  CHG is an antiseptic cleaner which kills germs and bonds with the skin to continue killing germs even after washing. Please DO NOT use if you have an allergy to CHG or antibacterial soaps.  If your skin becomes reddened/irritated stop using the CHG and inform your nurse when you arrive at Short Stay. Do not shave (including legs and underarms) for at least 48 hours prior to the first CHG shower.  You may shave your face/neck. Please follow these instructions carefully:  1.  Shower with CHG Soap the night before surgery and the  morning of Surgery.  2.  If you choose to wash your hair, wash your hair first as usual with your  normal  shampoo.  3.  After you shampoo, rinse your hair and body thoroughly to remove the  shampoo.                           4.  Use CHG as you would any other liquid soap.  You can apply chg directly  to the skin and wash                       Gently with a scrungie or clean washcloth.  5.  Apply the CHG Soap to your body ONLY FROM THE NECK DOWN.   Do not use on face/ open                            Wound or open sores. Avoid contact with eyes, ears mouth and genitals (private parts).                       Wash face,  Genitals (private parts) with your normal soap.             6.  Wash thoroughly, paying special attention to the area where your surgery  will be performed.  7.  Thoroughly rinse your body with warm water from the neck down.  8.  DO NOT shower/wash with your normal soap after using and rinsing off  the CHG Soap.                9.  Pat yourself dry with a clean towel.            10.  Wear clean pajamas.            11.  Place clean sheets on your bed the night of your first shower and do not  sleep with pets. Day of Surgery : Do not apply any lotions/deodorants the morning of surgery.  Please wear clean clothes to the hospital/surgery center.  FAILURE TO FOLLOW THESE INSTRUCTIONS MAY RESULT IN THE CANCELLATION OF YOUR SURGERY PATIENT SIGNATURE_________________________________  NURSE SIGNATURE__________________________________  ________________________________________________________________________   [name]  An incentive spirometer is a tool that can help keep your lungs clear and active. This tool measures how well you are filling your lungs with each breath. Taking long deep breaths may help reverse or decrease the chance of developing breathing (pulmonary) problems (especially infection) following:  A long period of time when you are unable to move or be active. BEFORE THE PROCEDURE   If the spirometer includes an indicator to show your best effort, your nurse or respiratory therapist will set it to a desired goal.  If possible, sit up straight or lean slightly forward. Try not to slouch.  Hold the incentive spirometer in an upright position. INSTRUCTIONS FOR USE  1. Sit on the edge of your bed if possible, or sit up as far as you can in bed or on a chair. 2. Hold the incentive spirometer in an upright position. 3. Breathe out  normally. 4. Place the mouthpiece in your mouth and seal your lips tightly around it. 5. Breathe in slowly and as deeply as possible, raising the piston or the ball toward the top of the column. 6. Hold your breath for 3-5 seconds or for as long as possible. Allow the piston or ball to fall to the bottom of the column. 7. Remove the mouthpiece from your mouth and breathe out normally. 8. Rest for a few seconds and repeat Steps 1 through 7 at least 10 times every 1-2 hours when you are awake. Take your time and take a few normal breaths between deep breaths. 9. The spirometer may include an indicator to show your best effort. Use the indicator as a goal to work toward during each repetition. 10. After  each set of 10 deep breaths, practice coughing to be sure your lungs are clear. If you have an incision (the cut made at the time of surgery), support your incision when coughing by placing a pillow or rolled up towels firmly against it. Once you are able to get out of bed, walk around indoors and cough well. You may stop using the incentive spirometer when instructed by your caregiver.  RISKS AND COMPLICATIONS  Take your time so you do not get dizzy or light-headed.  If you are in pain, you may need to take or ask for pain medication before doing incentive spirometry. It is harder to take a deep breath if you are having pain. AFTER USE  Rest and breathe slowly and easily.  It can be helpful to keep track of a log of your progress. Your caregiver can provide you with a simple table to help with this. If you are using the spirometer at home, follow these instructions: [place] IF:   You are having difficultly using the spirometer.  You have trouble using the spirometer as often as instructed.  Your pain medication is not giving enough relief while using the spirometer.  You develop fever of 100.5 F (38.1 C) or higher. SEEK IMMEDIATE MEDICAL CARE IF:   You cough up bloody sputum  that had not been present before.  You develop fever of 102 F (38.9 C) or greater.  You develop worsening pain at or near the incision site. MAKE SURE YOU:   Understand these instructions.  Will watch your condition.  Will get help right away if you are not doing well or get worse. Document Released: 05/29/2006 Document Revised: 04/10/2011 Document Reviewed: 07/30/2006 ExitCare Patient Information 2014 ExitCare, [place].   ________________________________________________________________________  WHAT IS A BLOOD TRANSFUSION? Blood Transfusion Information  A transfusion is the replacement of blood or some of its parts. Blood is made up of multiple cells which provide different functions.  Red blood cells carry oxygen and are used for blood loss replacement.  White blood cells fight against infection.  Platelets control bleeding.  Plasma helps clot blood.  Other blood products are available for specialized needs, such as hemophilia or other clotting disorders. BEFORE THE TRANSFUSION  Who gives blood for transfusions?   Healthy volunteers who are fully evaluated to make sure their blood is safe. This is blood bank blood. Transfusion therapy is the safest it has ever been in the practice of medicine. Before blood is taken from a donor, a complete history is taken to make sure that person has no history of diseases nor engages in risky social behavior (examples are intravenous drug use or sexual activity with multiple partners). The donor's travel history is screened to minimize risk of transmitting infections, such as malaria. The donated blood is tested for signs of infectious diseases, such as HIV and hepatitis. The blood is then tested to be sure it is compatible with you in order to minimize the chance of a transfusion reaction. If you or a relative donates blood, this is often done in anticipation of surgery and is not appropriate for emergency situations. It takes many days to  process the donated blood. RISKS AND COMPLICATIONS Although transfusion therapy is very safe and saves many lives, the main dangers of transfusion include:   Getting an infectious disease.  Developing a transfusion reaction. This is an allergic reaction to something in the blood you were given. Every precaution is taken to prevent this. The decision to  have a blood transfusion has been considered carefully by your caregiver before blood is given. Blood is not given unless the benefits outweigh the risks. AFTER THE TRANSFUSION  Right after receiving a blood transfusion, you will usually feel much better and more energetic. This is especially true if your red blood cells have gotten low (anemic). The transfusion raises the level of the red blood cells which carry oxygen, and this usually causes an energy increase.  The nurse administering the transfusion will monitor you carefully for complications. HOME CARE INSTRUCTIONS  No special instructions are needed after a transfusion. You may find your energy is better. Speak with your caregiver about any limitations on activity for underlying diseases you may have. SEEK MEDICAL CARE IF:   Your condition is not improving after your transfusion.  You develop redness or irritation at the intravenous (IV) site. SEEK IMMEDIATE MEDICAL CARE IF:  Any of the following symptoms occur over the next 12 hours:  Shaking chills.  You have a temperature by mouth above 102 F (38.9 C), not controlled by medicine.  Chest, back, or muscle pain.  People around you feel you are not acting correctly or are confused.  Shortness of breath or difficulty breathing.  Dizziness and fainting.  You get a rash or develop hives.  You have a decrease in urine output.  Your urine turns a dark color or changes to pink, red, or brown. Any of the following symptoms occur over the next 10 days:  You have a temperature by mouth above 102 F (38.9 C), not controlled by  medicine.  Shortness of breath.  Weakness after normal activity.  The white part of the eye turns yellow (jaundice).  You have a decrease in the amount of urine or are urinating less often.  Your urine turns a dark color or changes to pink, red, or brown. Document Released: 01/14/2000 Document Revised: 04/10/2011 Document Reviewed: 09/02/2007 [facility] Patient Information 2014 , [place].  _______________________________________________________________________

## 2017-01-04 DIAGNOSIS — M169 Osteoarthritis of hip, unspecified: Secondary | ICD-10-CM | POA: Diagnosis not present

## 2017-01-04 DIAGNOSIS — G4733 Obstructive sleep apnea (adult) (pediatric): Secondary | ICD-10-CM | POA: Diagnosis not present

## 2017-01-05 ENCOUNTER — Encounter (INDEPENDENT_AMBULATORY_CARE_PROVIDER_SITE_OTHER): Payer: Self-pay

## 2017-01-05 ENCOUNTER — Encounter (HOSPITAL_COMMUNITY): Payer: Self-pay

## 2017-01-05 ENCOUNTER — Other Ambulatory Visit: Payer: Self-pay

## 2017-01-05 ENCOUNTER — Encounter (HOSPITAL_COMMUNITY)
Admission: RE | Admit: 2017-01-05 | Discharge: 2017-01-05 | Disposition: A | Discharge status: Home / Self Care | Payer: Medicare HMO | Source: Ambulatory Visit | Attending: Orthopedic Surgery | Admitting: Orthopedic Surgery

## 2017-01-05 DIAGNOSIS — Z01812 Encounter for preprocedural laboratory examination: Secondary | ICD-10-CM | POA: Insufficient documentation

## 2017-01-05 DIAGNOSIS — Z0183 Encounter for blood typing: Secondary | ICD-10-CM | POA: Insufficient documentation

## 2017-01-05 DIAGNOSIS — M1612 Unilateral primary osteoarthritis, left hip: Secondary | ICD-10-CM | POA: Diagnosis not present

## 2017-01-05 LAB — BASIC METABOLIC PANEL
Anion gap: 7 (ref 5–15)
BUN: 15 mg/dL (ref 6–20)
CALCIUM: 9.4 mg/dL (ref 8.9–10.3)
CHLORIDE: 102 mmol/L (ref 101–111)
CO2: 30 mmol/L (ref 22–32)
CREATININE: 1.06 mg/dL — AB (ref 0.44–1.00)
GFR calc non Af Amer: 53 mL/min — ABNORMAL LOW (ref >60)
Glucose, Bld: 111 mg/dL — ABNORMAL HIGH (ref 65–99)
Potassium: 4.3 mmol/L (ref 3.5–5.1)
SODIUM: 139 mmol/L (ref 135–145)

## 2017-01-05 LAB — SURGICAL PCR SCREEN
MRSA, PCR: NEGATIVE
Staphylococcus aureus: NEGATIVE

## 2017-01-05 LAB — CBC
HCT: 41 % (ref 36.0–46.0)
Hemoglobin: 13.3 g/dL (ref 12.0–15.0)
MCH: 29.5 pg (ref 26.0–34.0)
MCHC: 32.4 g/dL (ref 30.0–36.0)
MCV: 90.9 fL (ref 78.0–100.0)
PLATELETS: 254 10*3/uL (ref 150–400)
RBC: 4.51 MIL/uL (ref 3.87–5.11)
RDW: 13.9 % (ref 11.5–15.5)
WBC: 6 10*3/uL (ref 4.0–10.5)

## 2017-01-09 ENCOUNTER — Encounter (HOSPITAL_COMMUNITY): Payer: Self-pay

## 2017-01-09 ENCOUNTER — Inpatient Hospital Stay (HOSPITAL_COMMUNITY): Payer: Medicare HMO | Admitting: Anesthesiology

## 2017-01-09 ENCOUNTER — Encounter (HOSPITAL_COMMUNITY)
Admission: RE | Disposition: A | Discharge status: Home / Self Care | Payer: Self-pay | Source: Ambulatory Visit | Attending: Orthopedic Surgery

## 2017-01-09 ENCOUNTER — Inpatient Hospital Stay (HOSPITAL_COMMUNITY)
Admission: RE | Admit: 2017-01-09 | Discharge: 2017-01-10 | DRG: 470 | Disposition: A | Discharge status: Home / Self Care | Payer: Medicare HMO | Source: Ambulatory Visit | Attending: Orthopedic Surgery | Admitting: Orthopedic Surgery

## 2017-01-09 ENCOUNTER — Inpatient Hospital Stay (HOSPITAL_COMMUNITY): Payer: Medicare HMO

## 2017-01-09 ENCOUNTER — Other Ambulatory Visit: Payer: Self-pay

## 2017-01-09 DIAGNOSIS — Z96649 Presence of unspecified artificial hip joint: Secondary | ICD-10-CM

## 2017-01-09 DIAGNOSIS — F329 Major depressive disorder, single episode, unspecified: Secondary | ICD-10-CM | POA: Diagnosis present

## 2017-01-09 DIAGNOSIS — K219 Gastro-esophageal reflux disease without esophagitis: Secondary | ICD-10-CM | POA: Diagnosis not present

## 2017-01-09 DIAGNOSIS — Z8249 Family history of ischemic heart disease and other diseases of the circulatory system: Secondary | ICD-10-CM

## 2017-01-09 DIAGNOSIS — Z96641 Presence of right artificial hip joint: Secondary | ICD-10-CM | POA: Diagnosis present

## 2017-01-09 DIAGNOSIS — G4700 Insomnia, unspecified: Secondary | ICD-10-CM | POA: Diagnosis not present

## 2017-01-09 DIAGNOSIS — E663 Overweight: Secondary | ICD-10-CM | POA: Diagnosis present

## 2017-01-09 DIAGNOSIS — Z823 Family history of stroke: Secondary | ICD-10-CM | POA: Diagnosis not present

## 2017-01-09 DIAGNOSIS — M1612 Unilateral primary osteoarthritis, left hip: Principal | ICD-10-CM | POA: Diagnosis present

## 2017-01-09 DIAGNOSIS — I10 Essential (primary) hypertension: Secondary | ICD-10-CM | POA: Diagnosis not present

## 2017-01-09 DIAGNOSIS — Z6829 Body mass index (BMI) 29.0-29.9, adult: Secondary | ICD-10-CM

## 2017-01-09 DIAGNOSIS — Z96642 Presence of left artificial hip joint: Secondary | ICD-10-CM | POA: Diagnosis not present

## 2017-01-09 DIAGNOSIS — E785 Hyperlipidemia, unspecified: Secondary | ICD-10-CM | POA: Diagnosis present

## 2017-01-09 DIAGNOSIS — M8580 Other specified disorders of bone density and structure, unspecified site: Secondary | ICD-10-CM | POA: Diagnosis present

## 2017-01-09 DIAGNOSIS — R7303 Prediabetes: Secondary | ICD-10-CM | POA: Diagnosis not present

## 2017-01-09 DIAGNOSIS — E669 Obesity, unspecified: Secondary | ICD-10-CM | POA: Diagnosis present

## 2017-01-09 DIAGNOSIS — Z471 Aftercare following joint replacement surgery: Secondary | ICD-10-CM | POA: Diagnosis not present

## 2017-01-09 DIAGNOSIS — Z800 Family history of malignant neoplasm of digestive organs: Secondary | ICD-10-CM | POA: Diagnosis not present

## 2017-01-09 HISTORY — PX: TOTAL HIP ARTHROPLASTY: SHX124

## 2017-01-09 SURGERY — ARTHROPLASTY, HIP, TOTAL, ANTERIOR APPROACH
Anesthesia: Monitor Anesthesia Care | Site: Hip | Laterality: Left

## 2017-01-09 MED ORDER — FENTANYL CITRATE (PF) 100 MCG/2ML IJ SOLN
INTRAMUSCULAR | Status: DC | PRN
  Administered 2017-01-09: 100 ug via INTRAVENOUS

## 2017-01-09 MED ORDER — METHOCARBAMOL 500 MG PO TABS
500.0000 mg | ORAL_TABLET | Freq: Four times a day (QID) | ORAL | Status: DC | PRN
  Administered 2017-01-10: 06:00:00 500 mg via ORAL
  Filled 2017-01-09: qty 1

## 2017-01-09 MED ORDER — PROPOFOL 10 MG/ML IV BOLUS
INTRAVENOUS | Status: AC
Start: 2017-01-09 — End: 2017-01-09
  Filled 2017-01-09: qty 20

## 2017-01-09 MED ORDER — PRAVASTATIN SODIUM 80 MG PO TABS
80.0000 mg | ORAL_TABLET | Freq: Every day | ORAL | Status: DC
  Administered 2017-01-09: 80 mg via ORAL
  Filled 2017-01-09: qty 1

## 2017-01-09 MED ORDER — PROPOFOL 10 MG/ML IV BOLUS
INTRAVENOUS | Status: DC | PRN
  Administered 2017-01-09 (×2): 20 mg via INTRAVENOUS

## 2017-01-09 MED ORDER — DEXAMETHASONE SODIUM PHOSPHATE 10 MG/ML IJ SOLN
INTRAMUSCULAR | Status: AC
  Filled 2017-01-09: qty 1

## 2017-01-09 MED ORDER — METOCLOPRAMIDE HCL 5 MG/ML IJ SOLN: 5.0000 mg | Freq: Three times a day (TID) | INTRAMUSCULAR | Status: DC | PRN

## 2017-01-09 MED ORDER — FERROUS SULFATE 325 (65 FE) MG PO TABS
325.0000 mg | ORAL_TABLET | Freq: Three times a day (TID) | ORAL | Status: DC
  Administered 2017-01-10: 10:00:00 325 mg via ORAL
  Filled 2017-01-09: qty 1

## 2017-01-09 MED ORDER — EPHEDRINE 5 MG/ML INJ
INTRAVENOUS | Status: AC
  Filled 2017-01-09: qty 10

## 2017-01-09 MED ORDER — DIPHENHYDRAMINE HCL 12.5 MG/5ML PO ELIX
12.5000 mg | ORAL_SOLUTION | ORAL | Status: DC | PRN
  Administered 2017-01-10: 25 mg via ORAL
  Filled 2017-01-09: qty 10
  Filled 2017-01-09: qty 5

## 2017-01-09 MED ORDER — POLYETHYLENE GLYCOL 3350 17 G PO PACK
17.0000 g | PACK | Freq: Two times a day (BID) | ORAL | Status: DC
  Administered 2017-01-09 – 2017-01-10 (×2): 17 g via ORAL
  Filled 2017-01-09 (×2): qty 1

## 2017-01-09 MED ORDER — HYDROCODONE-ACETAMINOPHEN 7.5-325 MG PO TABS
1.0000 | ORAL_TABLET | ORAL | Status: DC | PRN
  Administered 2017-01-09 (×2): 1 via ORAL
  Filled 2017-01-09 (×3): qty 1

## 2017-01-09 MED ORDER — PROPOFOL 500 MG/50ML IV EMUL
INTRAVENOUS | Status: DC | PRN
  Administered 2017-01-09: 100 ug/kg/min via INTRAVENOUS

## 2017-01-09 MED ORDER — FENTANYL CITRATE (PF) 100 MCG/2ML IJ SOLN
INTRAMUSCULAR | Status: AC
  Filled 2017-01-09: qty 2

## 2017-01-09 MED ORDER — FLUOXETINE HCL 20 MG PO CAPS
20.0000 mg | ORAL_CAPSULE | Freq: Every day | ORAL | Status: DC
Start: 2017-01-09 — End: 2017-01-10
  Administered 2017-01-09: 21:00:00 20 mg via ORAL
  Filled 2017-01-09: qty 1

## 2017-01-09 MED ORDER — METHOCARBAMOL 500 MG PO TABS: 500.0000 mg | ORAL_TABLET | Freq: Four times a day (QID) | ORAL | 0 refills | Status: DC | PRN

## 2017-01-09 MED ORDER — ALUM & MAG HYDROXIDE-SIMETH 200-200-20 MG/5ML PO SUSP: 15.0000 mL | ORAL | Status: DC | PRN

## 2017-01-09 MED ORDER — HYDROCHLOROTHIAZIDE 12.5 MG PO CAPS
12.5000 mg | ORAL_CAPSULE | Freq: Every day | ORAL | Status: DC
  Administered 2017-01-10: 12.5 mg via ORAL
  Filled 2017-01-09: qty 1

## 2017-01-09 MED ORDER — HYDROCODONE-ACETAMINOPHEN 7.5-325 MG PO TABS: 1.0000 | ORAL_TABLET | ORAL | 0 refills | Status: DC | PRN

## 2017-01-09 MED ORDER — METOCLOPRAMIDE HCL 5 MG PO TABS: 5.0000 mg | ORAL_TABLET | Freq: Three times a day (TID) | ORAL | Status: DC | PRN

## 2017-01-09 MED ORDER — MENTHOL 3 MG MT LOZG: 1.0000 | LOZENGE | OROMUCOSAL | Status: DC | PRN

## 2017-01-09 MED ORDER — DEXTROSE 5 % IV SOLN
500.0000 mg | Freq: Four times a day (QID) | INTRAVENOUS | Status: DC | PRN
  Administered 2017-01-09: 500 mg via INTRAVENOUS
  Filled 2017-01-09: qty 550

## 2017-01-09 MED ORDER — ACETAMINOPHEN 650 MG RE SUPP: 650.0000 mg | RECTAL | Status: DC | PRN

## 2017-01-09 MED ORDER — ONDANSETRON HCL 4 MG/2ML IJ SOLN: 4.0000 mg | Freq: Four times a day (QID) | INTRAMUSCULAR | Status: DC | PRN

## 2017-01-09 MED ORDER — PROPOFOL 10 MG/ML IV BOLUS
INTRAVENOUS | Status: AC
  Filled 2017-01-09: qty 20

## 2017-01-09 MED ORDER — PANTOPRAZOLE SODIUM 40 MG PO TBEC
40.0000 mg | DELAYED_RELEASE_TABLET | Freq: Two times a day (BID) | ORAL | Status: DC
  Administered 2017-01-09 – 2017-01-10 (×2): 40 mg via ORAL
  Filled 2017-01-09 (×2): qty 1

## 2017-01-09 MED ORDER — DEXAMETHASONE SODIUM PHOSPHATE 10 MG/ML IJ SOLN
10.0000 mg | Freq: Once | INTRAMUSCULAR | Status: AC
  Administered 2017-01-10: 10:00:00 10 mg via INTRAVENOUS
  Filled 2017-01-09: qty 1

## 2017-01-09 MED ORDER — DEXAMETHASONE SODIUM PHOSPHATE 10 MG/ML IJ SOLN
10.0000 mg | Freq: Once | INTRAMUSCULAR | Status: AC
  Administered 2017-01-09: 10 mg via INTRAVENOUS

## 2017-01-09 MED ORDER — ONDANSETRON HCL 4 MG/2ML IJ SOLN
INTRAMUSCULAR | Status: AC
  Filled 2017-01-09: qty 2

## 2017-01-09 MED ORDER — LACTATED RINGERS IV SOLN
INTRAVENOUS | Status: DC
  Administered 2017-01-09 (×2): via INTRAVENOUS

## 2017-01-09 MED ORDER — PROMETHAZINE HCL 25 MG/ML IJ SOLN: 6.2500 mg | INTRAMUSCULAR | Status: DC | PRN

## 2017-01-09 MED ORDER — ONDANSETRON HCL 4 MG PO TABS: 4.0000 mg | ORAL_TABLET | Freq: Four times a day (QID) | ORAL | Status: DC | PRN

## 2017-01-09 MED ORDER — STERILE WATER FOR IRRIGATION IR SOLN
Status: DC | PRN
  Administered 2017-01-09: 2000 mL

## 2017-01-09 MED ORDER — CEFAZOLIN SODIUM-DEXTROSE 2-4 GM/100ML-% IV SOLN
2.0000 g | Freq: Four times a day (QID) | INTRAVENOUS | Status: AC
  Administered 2017-01-09 – 2017-01-10 (×2): 2 g via INTRAVENOUS
  Filled 2017-01-09 (×2): qty 100

## 2017-01-09 MED ORDER — MIDAZOLAM HCL 2 MG/2ML IJ SOLN
INTRAMUSCULAR | Status: AC
Start: 2017-01-09 — End: 2017-01-09
  Filled 2017-01-09: qty 2

## 2017-01-09 MED ORDER — FERROUS SULFATE 325 (65 FE) MG PO TABS: 325.0000 mg | ORAL_TABLET | Freq: Three times a day (TID) | ORAL | 3 refills | Status: DC

## 2017-01-09 MED ORDER — ONDANSETRON HCL 4 MG/2ML IJ SOLN
INTRAMUSCULAR | Status: DC | PRN
  Administered 2017-01-09: 4 mg via INTRAVENOUS

## 2017-01-09 MED ORDER — TRANEXAMIC ACID 1000 MG/10ML IV SOLN
1000.0000 mg | INTRAVENOUS | Status: AC
  Administered 2017-01-09: 1000 mg via INTRAVENOUS
  Filled 2017-01-09: qty 1100

## 2017-01-09 MED ORDER — PHENYLEPHRINE 40 MCG/ML (10ML) SYRINGE FOR IV PUSH (FOR BLOOD PRESSURE SUPPORT)
PREFILLED_SYRINGE | INTRAVENOUS | Status: DC | PRN
  Administered 2017-01-09 (×3): 80 ug via INTRAVENOUS
  Administered 2017-01-09 (×2): 40 ug via INTRAVENOUS

## 2017-01-09 MED ORDER — CEFAZOLIN SODIUM-DEXTROSE 2-4 GM/100ML-% IV SOLN
INTRAVENOUS | Status: AC
  Filled 2017-01-09: qty 100

## 2017-01-09 MED ORDER — EPHEDRINE SULFATE-NACL 50-0.9 MG/10ML-% IV SOSY
PREFILLED_SYRINGE | INTRAVENOUS | Status: DC | PRN
  Administered 2017-01-09: 5 mg via INTRAVENOUS
  Administered 2017-01-09: 10 mg via INTRAVENOUS
  Administered 2017-01-09: 5 mg via INTRAVENOUS

## 2017-01-09 MED ORDER — SODIUM CHLORIDE 0.9 % IV SOLN
INTRAVENOUS | Status: DC
  Administered 2017-01-09 – 2017-01-10 (×2): via INTRAVENOUS

## 2017-01-09 MED ORDER — HYDROMORPHONE HCL 1 MG/ML IJ SOLN
INTRAMUSCULAR | Status: AC
  Administered 2017-01-09: 0.25 mg via INTRAVENOUS
  Filled 2017-01-09: qty 1

## 2017-01-09 MED ORDER — TRANEXAMIC ACID 1000 MG/10ML IV SOLN
1000.0000 mg | Freq: Once | INTRAVENOUS | Status: AC
  Administered 2017-01-09: 1000 mg via INTRAVENOUS
  Filled 2017-01-09: qty 1100

## 2017-01-09 MED ORDER — PHENYLEPHRINE 40 MCG/ML (10ML) SYRINGE FOR IV PUSH (FOR BLOOD PRESSURE SUPPORT)
PREFILLED_SYRINGE | INTRAVENOUS | Status: AC
  Filled 2017-01-09: qty 10

## 2017-01-09 MED ORDER — SODIUM CHLORIDE 0.9 % IR SOLN
Status: DC | PRN
  Administered 2017-01-09: 1000 mL

## 2017-01-09 MED ORDER — DOCUSATE SODIUM 100 MG PO CAPS
100.0000 mg | ORAL_CAPSULE | Freq: Two times a day (BID) | ORAL | Status: DC
  Administered 2017-01-09 – 2017-01-10 (×2): 100 mg via ORAL
  Filled 2017-01-09 (×3): qty 1

## 2017-01-09 MED ORDER — ASPIRIN 81 MG PO CHEW
81.0000 mg | CHEWABLE_TABLET | Freq: Two times a day (BID) | ORAL | Status: DC
  Administered 2017-01-09 – 2017-01-10 (×2): 81 mg via ORAL
  Filled 2017-01-09 (×2): qty 1

## 2017-01-09 MED ORDER — HYDROCODONE-ACETAMINOPHEN 7.5-325 MG PO TABS
2.0000 | ORAL_TABLET | ORAL | Status: DC | PRN
  Administered 2017-01-10: 2 via ORAL
  Filled 2017-01-09: qty 2

## 2017-01-09 MED ORDER — DOCUSATE SODIUM 100 MG PO CAPS: 100.0000 mg | ORAL_CAPSULE | Freq: Two times a day (BID) | ORAL | 0 refills | Status: DC

## 2017-01-09 MED ORDER — MAGNESIUM CITRATE PO SOLN: 1.0000 | Freq: Once | ORAL | Status: DC | PRN

## 2017-01-09 MED ORDER — BUPIVACAINE IN DEXTROSE 0.75-8.25 % IT SOLN
INTRATHECAL | Status: DC | PRN
  Administered 2017-01-09: 2 mL via INTRATHECAL

## 2017-01-09 MED ORDER — HYDROMORPHONE HCL 1 MG/ML IJ SOLN
0.5000 mg | INTRAMUSCULAR | Status: DC | PRN
Start: 2017-01-09 — End: 2017-01-10
  Administered 2017-01-09: 1 mg via INTRAVENOUS
  Administered 2017-01-09: 0.5 mg via INTRAVENOUS
  Filled 2017-01-09 (×2): qty 1

## 2017-01-09 MED ORDER — CHLORHEXIDINE GLUCONATE 4 % EX LIQD: 60.0000 mL | Freq: Once | CUTANEOUS | Status: DC

## 2017-01-09 MED ORDER — ASPIRIN 81 MG PO CHEW
81.0000 mg | CHEWABLE_TABLET | Freq: Two times a day (BID) | ORAL | 0 refills | Status: AC
Start: 2017-01-09 — End: 2017-02-08

## 2017-01-09 MED ORDER — BISACODYL 10 MG RE SUPP
10.0000 mg | Freq: Every day | RECTAL | Status: DC | PRN
Start: 2017-01-09 — End: 2017-01-10

## 2017-01-09 MED ORDER — CELECOXIB 200 MG PO CAPS
200.0000 mg | ORAL_CAPSULE | Freq: Two times a day (BID) | ORAL | Status: DC
  Administered 2017-01-09 – 2017-01-10 (×2): 200 mg via ORAL
  Filled 2017-01-09 (×2): qty 1

## 2017-01-09 MED ORDER — PHENOL 1.4 % MT LIQD
1.0000 | OROMUCOSAL | Status: DC | PRN
  Filled 2017-01-09: qty 177

## 2017-01-09 MED ORDER — POLYETHYLENE GLYCOL 3350 17 G PO PACK: 17.0000 g | PACK | Freq: Two times a day (BID) | ORAL | 0 refills | Status: DC

## 2017-01-09 MED ORDER — ACETAMINOPHEN 325 MG PO TABS
650.0000 mg | ORAL_TABLET | ORAL | Status: DC | PRN
  Administered 2017-01-10 (×2): 650 mg via ORAL
  Filled 2017-01-09 (×2): qty 2

## 2017-01-09 MED ORDER — TRAZODONE HCL 50 MG PO TABS: 25.0000 mg | ORAL_TABLET | Freq: Every evening | ORAL | Status: DC | PRN

## 2017-01-09 MED ORDER — HYDROMORPHONE HCL 1 MG/ML IJ SOLN
0.2500 mg | INTRAMUSCULAR | Status: DC | PRN
  Administered 2017-01-09 (×2): 0.25 mg via INTRAVENOUS
  Administered 2017-01-09: 0.5 mg via INTRAVENOUS

## 2017-01-09 MED ORDER — CEFAZOLIN SODIUM-DEXTROSE 2-4 GM/100ML-% IV SOLN
2.0000 g | INTRAVENOUS | Status: AC
  Administered 2017-01-09: 2 g via INTRAVENOUS

## 2017-01-09 MED ORDER — LIDOCAINE 2% (20 MG/ML) 5 ML SYRINGE
INTRAMUSCULAR | Status: DC | PRN
  Administered 2017-01-09: 50 mg via INTRAVENOUS

## 2017-01-09 SURGICAL SUPPLY — 35 items
BAG DECANTER FOR FLEXI CONT (MISCELLANEOUS) IMPLANT
BAG ZIPLOCK 12X15 (MISCELLANEOUS) IMPLANT
BLADE SAG 18X100X1.27 (BLADE) ×3 IMPLANT
CAPT HIP TOTAL 2 ×3 IMPLANT
CLOTH BEACON ORANGE TIMEOUT ST (SAFETY) ×3 IMPLANT
COVER PERINEAL POST (MISCELLANEOUS) ×3 IMPLANT
COVER SURGICAL LIGHT HANDLE (MISCELLANEOUS) ×3 IMPLANT
DERMABOND ADVANCED (GAUZE/BANDAGES/DRESSINGS) ×2
DERMABOND ADVANCED .7 DNX12 (GAUZE/BANDAGES/DRESSINGS) ×1 IMPLANT
DRAPE STERI IOBAN 125X83 (DRAPES) ×3 IMPLANT
DRAPE U-SHAPE 47X51 STRL (DRAPES) ×6 IMPLANT
DRESSING AQUACEL AG SP 3.5X10 (GAUZE/BANDAGES/DRESSINGS) ×1 IMPLANT
DRSG AQUACEL AG SP 3.5X10 (GAUZE/BANDAGES/DRESSINGS) ×3
DURAPREP 26ML APPLICATOR (WOUND CARE) ×3 IMPLANT
ELECT REM PT RETURN 15FT ADLT (MISCELLANEOUS) ×3 IMPLANT
GLOVE BIOGEL M STRL SZ7.5 (GLOVE) ×6 IMPLANT
GLOVE BIOGEL PI IND STRL 7.5 (GLOVE) ×7 IMPLANT
GLOVE BIOGEL PI IND STRL 8.5 (GLOVE) ×1 IMPLANT
GLOVE BIOGEL PI INDICATOR 7.5 (GLOVE) ×14
GLOVE BIOGEL PI INDICATOR 8.5 (GLOVE) ×2
GLOVE ECLIPSE 8.0 STRL XLNG CF (GLOVE) ×6 IMPLANT
GLOVE ORTHO TXT STRL SZ7.5 (GLOVE) ×3 IMPLANT
GOWN STRL REUS W/TWL LRG LVL3 (GOWN DISPOSABLE) ×3 IMPLANT
GOWN STRL REUS W/TWL XL LVL3 (GOWN DISPOSABLE) ×3 IMPLANT
HOLDER FOLEY CATH W/STRAP (MISCELLANEOUS) ×3 IMPLANT
PACK ANTERIOR HIP CUSTOM (KITS) ×3 IMPLANT
SUT MNCRL AB 4-0 PS2 18 (SUTURE) ×3 IMPLANT
SUT STRATAFIX 0 PDS 27 VIOLET (SUTURE) ×3
SUT VIC AB 1 CT1 36 (SUTURE) ×9 IMPLANT
SUT VIC AB 2-0 CT1 27 (SUTURE) ×4
SUT VIC AB 2-0 CT1 TAPERPNT 27 (SUTURE) ×2 IMPLANT
SUTURE STRATFX 0 PDS 27 VIOLET (SUTURE) ×1 IMPLANT
TRAY FOLEY W/METER SILVER 16FR (SET/KITS/TRAYS/PACK) IMPLANT
WATER STERILE IRR 1000ML POUR (IV SOLUTION) ×3 IMPLANT
YANKAUER SUCT BULB TIP 10FT TU (MISCELLANEOUS) IMPLANT

## 2017-01-09 NOTE — Discharge Instructions (Signed)

## 2017-01-09 NOTE — Anesthesia Preprocedure Evaluation (Signed)
Anesthesia Evaluation  Patient identified by MRN, date of birth, ID band Patient awake    Reviewed: Allergy & Precautions, NPO status , Patient's Chart, lab work & pertinent test results  History of Anesthesia Complications Negative for: history of anesthetic complications  Airway Mallampati: II  TM Distance: >3 FB Neck ROM: Full    Dental no notable dental hx. (+) Dental Advisory Given   Pulmonary neg pulmonary ROS,    Pulmonary exam normal breath sounds clear to auscultation       Cardiovascular hypertension, Normal cardiovascular exam Rhythm:Regular Rate:Normal     Neuro/Psych PSYCHIATRIC DISORDERS Depression negative neurological ROS     GI/Hepatic Neg liver ROS, hiatal hernia, GERD  Medicated and Controlled,  Endo/Other  negative endocrine ROS  Renal/GU negative Renal ROS  negative genitourinary   Musculoskeletal negative musculoskeletal ROS (+)   Abdominal   Peds negative pediatric ROS (+)  Hematology negative hematology ROS (+)   Anesthesia Other Findings   Reproductive/Obstetrics negative OB ROS                             Anesthesia Physical  Anesthesia Plan  ASA: II  Anesthesia Plan: MAC and Spinal   Post-op Pain Management:    Induction:   PONV Risk Score and Plan: 2 and Ondansetron and Propofol infusion  Airway Management Planned: Natural Airway  Additional Equipment:   Intra-op Plan:   Post-operative Plan:   Informed Consent: I have reviewed the patients History and Physical, chart, labs and discussed the procedure including the risks, benefits and alternatives for the proposed anesthesia with the patient or authorized representative who has indicated his/her understanding and acceptance.   Dental advisory given  Plan Discussed with: CRNA and Anesthesiologist  Anesthesia Plan Comments:         Anesthesia Quick Evaluation

## 2017-01-09 NOTE — Interval H&P Note (Signed)
History and Physical Interval Note:  01/09/2017 11:41 AM  Connie Rowe  has presented today for surgery, with the diagnosis of Left hip osteoarthritis  The various methods of treatment have been discussed with the patient and family. After consideration of risks, benefits and other options for treatment, the patient has consented to  Procedure(s) with comments: LEFT TOTAL HIP ARTHROPLASTY ANTERIOR APPROACH (Left) - 70 mins as a surgical intervention .  The patient's history has been reviewed, patient examined, no change in status, stable for surgery.  I have reviewed the patient's chart and labs.  Questions were answered to the patient's satisfaction.     Mauri Pole

## 2017-01-09 NOTE — Progress Notes (Signed)
Pt has declined the use of CPAP QHS.  RT to monitor and assess as needed.  

## 2017-01-09 NOTE — Op Note (Signed)
NAME:  Connie Rowe.: [id number]      MEDICAL RECORD NO.: [id number]      FACILITY:  [facility]      PHYSICIAN:  Mauri Pole  DATE OF BIRTH:  07/26/1950     DATE OF PROCEDURE:  01/09/2017                                 OPERATIVE REPORT         PREOPERATIVE DIAGNOSIS: Left  hip osteoarthritis.      POSTOPERATIVE DIAGNOSIS:  Left hip osteoarthritis.      PROCEDURE:  Left total hip replacement through an anterior approach   utilizing DePuy THR system, component size 72mm pinnacle cup, a size 36+4 neutral   Altrex liner, a size 3 Hi Tri Lock stem with a 36+5 delta ceramic   ball.      SURGEON:  Pietro Cassis. Alvan Dame, M.D.      ASSISTANT:  Nehemiah Massed, PA-C     ANESTHESIA:  Spinal.      SPECIMENS:  None.      COMPLICATIONS:  None.      BLOOD LOSS:  400 cc     DRAINS:  None.      INDICATION OF THE PROCEDURE:  Connie Rowe is a 66 y.o. female who had   presented to office for evaluation of left hip pain.  Radiographs revealed   progressive degenerative changes with bone-on-bone   articulation to the  hip joint.  The patient had painful limited range of   motion significantly affecting their overall quality of life.  The patient was failing to    respond to conservative measures, and at this point was ready   to proceed with more definitive measures.  The patient has noted progressive   degenerative changes in his hip, progressive problems and dysfunction   with regarding the hip prior to surgery.  Consent was obtained for   benefit of pain relief.  Specific risk of infection, DVT, component   failure, dislocation, need for revision surgery, as well discussion of   the anterior versus posterior approach were reviewed.  Consent was   obtained for benefit of anterior pain relief through an anterior   approach.      PROCEDURE IN DETAIL:  The patient was brought to operative theater.   Once adequate anesthesia, preoperative  antibiotics, 2 gm of Ancef, 1 gm of Tranexamic Acid, and 10 mg of Decadron administered.   The patient was positioned supine on the OSI Hanna table.  Once adequate   padding of boney process was carried out, we had predraped out the hip, and  used fluoroscopy to confirm orientation of the pelvis and position.      The left hip was then prepped and draped from proximal iliac crest to   mid thigh with shower curtain technique.      Time-out was performed identifying the patient, planned procedure, and   extremity.     An incision was then made 2 cm distal and lateral to the   anterior superior iliac spine extending over the orientation of the   tensor fascia lata muscle and sharp dissection was carried down to the   fascia of the muscle and protractor placed in the soft tissues.      The  fascia was then incised.  The muscle belly was identified and swept   laterally and retractor placed along the superior neck.  Following   cauterization of the circumflex vessels and removing some pericapsular   fat, a second cobra retractor was placed on the inferior neck.  A third   retractor was placed on the anterior acetabulum after elevating the   anterior rectus.  A L-capsulotomy was along the line of the   superior neck to the trochanteric fossa, then extended proximally and   distally.  Tag sutures were placed and the retractors were then placed   intracapsular.  We then identified the trochanteric fossa and   orientation of my neck cut, confirmed this radiographically   and then made a neck osteotomy with the femur on traction.  The femoral   head was removed without difficulty or complication.  Traction was let   off and retractors were placed posterior and anterior around the   acetabulum.      The labrum and foveal tissue were debrided.  I began reaming with a 54mm   reamer and reamed up to 7mm reamer with good bony bed preparation and a 101mm   cup was chosen.  The final 89mm Pinnacle cup  was then impacted under fluoroscopy  to confirm the depth of penetration and orientation with respect to   abduction.  A screw was placed followed by the hole eliminator.  The final   36+4 neutral Altrex liner was impacted with good visualized rim fit.  The cup was positioned anatomically within the acetabular portion of the pelvis.      At this point, the femur was rolled at 80 degrees.  Further capsule was   released off the inferior aspect of the femoral neck.  I then   released the superior capsule proximally.  The hook was placed laterally   along the femur and elevated manually and held in position with the bed   hook.  The leg was then extended and adducted with the leg rolled to 100   degrees of external rotation.  Once the proximal femur was fully   exposed, I used a box osteotome to set orientation.  I then began   broaching with the starting chili pepper broach and passed this by hand and then broached up to 3.  With the 3 broach in place I chose a high offset neck and did several trial reductions.  The offset was appropriate, leg lengths   appeared to be equal best matched to the previously performed THR with a matching +5 head ball confirmed radiographically.   Given these findings, I went ahead and dislocated the hip, repositioned all   retractors and positioned the right hip in the extended and abducted position.  The final 3 Hi Tri Lock stem was   chosen and it was impacted down to the level of neck cut.  Based on this   and the trial reduction, a 36+5 delta ceramic ball was chosen and   impacted onto a clean and dry trunnion, and the hip was reduced.  The   hip had been irrigated throughout the case again at this point.  I did   reapproximate the superior capsular leaflet to the anterior leaflet   using #1 Vicryl.  The fascia of the   tensor fascia lata muscle was then reapproximated using #1 Vicryl and #0 Stratafix sutures.  The   remaining wound was closed with 2-0 Vicryl  and running 4-0 Monocryl.  The hip was cleaned, dried, and dressed sterilely using Dermabond and   Aquacel dressing.  She was then brought   to recovery room in stable condition tolerating the procedure well.    Nehemiah Massed, PA-C was present for the entirety of the case involved from   preoperative positioning, perioperative retractor management, general   facilitation of the case, as well as primary wound closure as assistant.            Pietro Cassis Alvan Dame, M.D.        01/09/2017 2:38 PM

## 2017-01-09 NOTE — Anesthesia Postprocedure Evaluation (Signed)
Anesthesia Post Note  Patient: Connie Rowe  Procedure(s) Performed: LEFT TOTAL HIP ARTHROPLASTY ANTERIOR APPROACH (Left Hip)     Patient location during evaluation: PACU Anesthesia Type: MAC and Spinal Level of consciousness: awake and alert Pain management: pain level controlled Vital Signs Assessment: post-procedure vital signs reviewed and stable Respiratory status: spontaneous breathing and respiratory function stable Cardiovascular status: blood pressure returned to baseline and stable Postop Assessment: spinal receding Anesthetic complications: no    Last Vitals:  Vitals:   01/09/17 1600 01/09/17 1621  BP: 138/72 130/71  Pulse: 77 84  Resp: 15 16  Temp: 36.6 C 36.8 C  SpO2: 96% 95%    Last Pain:  Vitals:   01/09/17 1600  TempSrc:   PainSc: [place]

## 2017-01-09 NOTE — Transfer of Care (Signed)
Immediate Anesthesia Transfer of Care Note  Patient: Connie Rowe  Procedure(s) Performed: LEFT TOTAL HIP ARTHROPLASTY ANTERIOR APPROACH (Left Hip)  Patient Location: PACU  Anesthesia Type:Spinal  Level of Consciousness: awake, alert  and oriented  Airway & Oxygen Therapy: Patient Spontanous Breathing and Patient connected to face mask oxygen  Post-op Assessment: Report given to RN and Post -op Vital signs reviewed and stable  Post vital signs: Reviewed and stable  Last Vitals:  Vitals:   01/09/17 1114  BP: (!) 152/82  Pulse: 83  Resp: 16  Temp: 37.1 C  SpO2: 97%    Last Pain:  Vitals:   01/09/17 1114  TempSrc: Oral         Complications: No apparent anesthesia complications

## 2017-01-09 NOTE — Anesthesia Procedure Notes (Signed)
Spinal  Patient location during procedure: OR End time: 01/09/2017 1:09 PM Staffing Resident/CRNA: Noralyn Pick D, CRNA Performed: anesthesiologist and resident/CRNA  Preanesthetic Checklist Completed: patient identified, site marked, surgical consent, pre-op evaluation, timeout performed, IV checked, risks and benefits discussed and monitors and equipment checked Spinal Block Patient position: sitting Prep: Betadine Patient monitoring: heart rate, continuous pulse ox and blood pressure Approach: midline Location: L2-3 Injection technique: single-shot Needle Needle type: Sprotte and Spinocan  Needle gauge: 22 G Needle length: 9 cm Assessment Sensory level: T6 Additional Notes Expiration date of kit checked and confirmed. Patient tolerated procedure well, without complications.

## 2017-01-10 ENCOUNTER — Encounter (HOSPITAL_COMMUNITY): Payer: Self-pay | Admitting: Orthopedic Surgery

## 2017-01-10 DIAGNOSIS — E669 Obesity, unspecified: Secondary | ICD-10-CM | POA: Diagnosis present

## 2017-01-10 LAB — BASIC METABOLIC PANEL
ANION GAP: 6 (ref 5–15)
BUN: 13 mg/dL (ref 6–20)
CALCIUM: 8.4 mg/dL — AB (ref 8.9–10.3)
CHLORIDE: 104 mmol/L (ref 101–111)
CO2: 28 mmol/L (ref 22–32)
Creatinine, Ser: 0.94 mg/dL (ref 0.44–1.00)
GFR calc Af Amer: >60 mL/min (ref >60)
GFR calc non Af Amer: >60 mL/min (ref >60)
GLUCOSE: 188 mg/dL — AB (ref 65–99)
Potassium: 4 mmol/L (ref 3.5–5.1)
Sodium: 138 mmol/L (ref 135–145)

## 2017-01-10 LAB — CBC
HEMATOCRIT: 33 % — AB (ref 36.0–46.0)
Hemoglobin: 10.7 g/dL — ABNORMAL LOW (ref 12.0–15.0)
MCH: 29.6 pg (ref 26.0–34.0)
MCHC: 32.4 g/dL (ref 30.0–36.0)
MCV: 91.4 fL (ref 78.0–100.0)
Platelets: 207 10*3/uL (ref 150–400)
RBC: 3.61 MIL/uL — ABNORMAL LOW (ref 3.87–5.11)
RDW: 13.5 % (ref 11.5–15.5)
WBC: 8.6 10*3/uL (ref 4.0–10.5)

## 2017-01-10 LAB — TYPE AND SCREEN
ABO/RH(D): B POS
Antibody Screen: NEGATIVE

## 2017-01-10 NOTE — Progress Notes (Signed)
OT Cancellation Note  Patient Details Name: Connie Rowe MRN: [id number] DOB: 10-27-1950   Cancelled Treatment:    Reason Eval/Treat Not Completed: OT screened, no needs identified, will sign off. Spoke to patient. She recalls all ADL techniques from prior R [facility] in February 2017. She has all needed DME. OT will sign off.   A  01/10/2017, 10:07 AM

## 2017-01-10 NOTE — Progress Notes (Signed)
Physical Therapy Treatment Patient Details Name: Connie Rowe MRN: [id number] DOB: 1950/03/20 Today's Date: 01/10/2017    History of Present Illness L DATHA    PT Comments    The patient is ready for DC.  Follow Up Recommendations  No PT follow up;DC plan and follow up therapy as arranged by surgeon     Equipment Recommendations  None recommended by PT    Recommendations for Other Services       Precautions / Restrictions Restrictions Weight Bearing Restrictions: No    Mobility  Bed Mobility               General bed mobility comments: in recliner  Transfers   Equipment used: Rolling walker (2 wheeled)   Sit to Stand: Supervision            Ambulation/Gait Ambulation/Gait assistance: Supervision Ambulation Distance (Feet): 50 Feet Assistive device: Rolling walker (2 wheeled) Gait Pattern/deviations: Step-to pattern;Step-through pattern     General Gait Details: cues for sequence   Stairs Stairs: Yes   Stair Management: No rails;Step to pattern;Backwards;With walker Number of Stairs: 2    Wheelchair Mobility    Modified Rankin (Stroke Patients Only)       Balance                                            Cognition                                              Exercises      General Comments        Pertinent Vitals/Pain Pain Assessment: 0-10 Pain Score: 2  Pain Location: l hip Pain Descriptors / Indicators: Sore Pain Intervention(s): Monitored during session;Premedicated before session    Home Living                      Prior Function            PT Goals (current goals can now be found in the care plan section) Progress towards PT goals: Progressing toward goals    Frequency    7X/week      PT Plan Current plan remains appropriate    Co-evaluation              AM-PAC PT "6 Clicks" Daily Activity  Outcome Measure  Difficulty turning over in bed  (including adjusting bedclothes, sheets and blankets)?: None Difficulty moving from lying on back to sitting on the side of the bed? : None Difficulty sitting down on and standing up from a chair with arms (e.g., wheelchair, bedside commode, etc,.)?: None Help needed moving to and from a bed to chair (including a wheelchair)?: None Help needed walking in hospital room?: None Help needed climbing 3-5 steps with a railing? : A Little 6 Click Score: 23    End of Session   Activity Tolerance: Patient tolerated treatment well Patient left: in chair;with call bell/phone within reach Nurse Communication: Mobility status PT Visit Diagnosis: Difficulty in walking, not elsewhere classified (R26.2)     Time: 4098-1191 PT Time Calculation (min) (ACUTE ONLY): 12 min  Charges:  $Gait Training: 8-22 mins  G CodesTresa Endo PT [phone]    Claretha Cooper 01/10/2017, 1:22 PM

## 2017-01-10 NOTE — Evaluation (Signed)
Physical Therapy Evaluation Patient Details Name: Connie Rowe MRN: [id number] DOB: 07-Oct-1950 Today's Date: 01/10/2017   History of Present Illness  L DATHA  Clinical Impression  Patient is progressing very well. Will practice steps, then to DC. Pt admitted ith above diagnosis. Pt currently with functional limitations due to the deficits listed below (see PT Problem List).  Pt will benefit from skilled PT to increase their independence and safety with mobility to allow discharge to the venue listed below.       Follow Up Recommendations No PT follow up;DC plan and follow up therapy as arranged by surgeon    Equipment Recommendations  None recommended by PT    Recommendations for Other Services       Precautions / Restrictions Precautions Precautions: Fall Restrictions Weight Bearing Restrictions: No      Mobility  Bed Mobility Overal bed mobility: Independent Bed Mobility: Supine to Sit              Transfers Overall transfer level: Needs assistance Equipment used: Rolling walker (2 wheeled) Transfers: Sit to/from Stand Sit to Stand: Supervision         General transfer comment: VC for hand placement  Ambulation/Gait Ambulation/Gait assistance: Supervision Ambulation Distance (Feet): 125 Feet Assistive device: Rolling walker (2 wheeled) Gait Pattern/deviations: Step-through pattern;Antalgic     General Gait Details: cues for sequence  Stairs            Wheelchair Mobility    Modified Rankin (Stroke Patients Only)       Balance                                             Pertinent Vitals/Pain Pain Assessment: No/denies pain    Home Living Family/patient expects to be discharged to:: Private residence Living Arrangements: Spouse/significant other Available Help at Discharge: Family Type of Home: House Home Access: Stairs to enter Entrance Stairs-Rails: None Entrance Stairs-Number of Steps: 3 Home Layout: One  level Home Equipment: Environmental consultant - 2 wheels;Cane - single point      Prior Function Level of Independence: Independent               Hand Dominance        Extremity/Trunk Assessment   Upper Extremity Assessment Upper Extremity Assessment: Overall WFL for tasks assessed    Lower Extremity Assessment LLE Deficits / Details: able to actively flex hip in supine    Cervical / Trunk Assessment Cervical / Trunk Assessment: Normal  Communication   Communication: No difficulties  Cognition Arousal/Alertness: Awake/alert Behavior During Therapy: WFL for tasks assessed/performed Overall Cognitive Status: Within Functional Limits for tasks assessed                                        General Comments      Exercises Total Joint Exercises Ankle Circles/Pumps: AROM;Both;10 reps Short Arc Quad: AROM;Left;10 reps Heel Slides: AAROM;Left;10 reps Hip ABduction/ADduction: AAROM;Left;10 reps   Assessment/Plan    PT Assessment Patient needs continued PT services  PT Problem List Decreased strength;Decreased range of motion;Decreased activity tolerance       PT Treatment Interventions DME instruction;Gait training;Stair training;Therapeutic activities;Therapeutic exercise;Patient/family education    PT Goals (Current goals can be found in the Care Plan section)  Acute Rehab PT Goals  Patient Stated Goal: to go home PT Goal Formulation: With patient Time For Goal Achievement: 01/11/17 Potential to Achieve Goals: Good    Frequency 7X/week   Barriers to discharge        Co-evaluation               AM-PAC PT "6 Clicks" Daily Activity  Outcome Measure Difficulty turning over in bed (including adjusting bedclothes, sheets and blankets)?: None Difficulty moving from lying on back to sitting on the side of the bed? : None Difficulty sitting down on and standing up from a chair with arms (e.g., wheelchair, bedside commode, etc,.)?: A Little Help needed  moving to and from a bed to chair (including a wheelchair)?: A Little Help needed walking in hospital room?: A Little Help needed climbing 3-5 steps with a railing? : A Little 6 Click Score: 20    End of Session   Activity Tolerance: Patient tolerated treatment well Patient left: in chair;with call bell/phone within reach Nurse Communication: Mobility status PT Visit Diagnosis: Difficulty in walking, not elsewhere classified (R26.2)    Time: 8921-1941 PT Time Calculation (min) (ACUTE ONLY): 31 min   Charges:   PT Evaluation $PT Eval Low Complexity: 1 Low PT Treatments $Gait Training: 8-22 mins   PT G CodesTresa Endo PT [phone]   Claretha Cooper 01/10/2017, 9:09 AM

## 2017-01-10 NOTE — Progress Notes (Signed)
     Subjective: 1 Day Post-Op Procedure(s) (LRB): LEFT TOTAL HIP ARTHROPLASTY ANTERIOR APPROACH (Left)   Patient reports pain as mild, pain controlled. No major events throughout the night.  She does c/o itching, but she states analgesic medications will cause itching. We have discussed the use of benadryl.  She also states that she didn't get much sleep.  Looking forward to getting home.  Ready to be discharged home.   Objective:   VITALS:   Vitals:   01/10/17 0251 01/10/17 0617  BP: 118/60 (!) 104/49  Pulse: 83 74  Resp: 14 15  Temp: 98.2 F (36.8 C) 97.7 F (36.5 C)  SpO2: 94% 96%    Dorsiflexion/Plantar flexion intact Incision: dressing C/D/I No cellulitis present Compartment soft  LABS Recent Labs    01/10/17 0559  HGB 10.7*  HCT 33.0*  WBC 8.6  PLT 207    Recent Labs    01/10/17 0559  NA 138  K 4.0  BUN 13  CREATININE 0.94  GLUCOSE 188*     Assessment/Plan: 1 Day Post-Op Procedure(s) (LRB): LEFT TOTAL HIP ARTHROPLASTY ANTERIOR APPROACH (Left) Foley cath d/c'ed Advance diet Up with therapy D/C IV fluids Discharge home Follow up in 2 weeks at [facility]. Follow up with OLIN, D in 2 weeks.  Contact information:  [facility] [address], [place] [phone]    Overweight (BMI 25-29.9) Estimated body mass index is 29.52 kg/m as calculated from the following:   Height as of this encounter: 5\' 4"  (1.626 m).   Weight as of this encounter: 78 kg (172 lb). Patient also counseled that weight may inhibit the healing process Patient counseled that losing weight will help with future health issues         West Pugh.    PAC  01/10/2017, 9:02 AM

## 2017-01-10 NOTE — Progress Notes (Signed)
Patient discharged to home with husband. Given all belongings, instructions, prescriptions. Husb present for all teaching. Verbalized understanding of all instructions. Escorted to pov via w/c.

## 2017-01-12 NOTE — Discharge Summary (Signed)
Physician Discharge Summary  Patient ID: Connie Rowe MRN: [id number] DOB/AGE: February 05, 1950 66 y.o.  Admit date: 01/09/2017 Discharge date: 01/10/2017   Procedures:  Procedure(s) (LRB): LEFT TOTAL HIP ARTHROPLASTY ANTERIOR APPROACH (Left)  Attending Physician:  Dr. Paralee Cancel   Admission Diagnoses:   Left hip primary OA / pain  Discharge Diagnoses:  Active Problems:   S/P right THA, AA   Overweight (BMI 25.0-29.9)  Past Medical History:  Diagnosis Date  . Arthritis   . Colon polyps   . Depression   . Esophageal stricture   . Falls   . GERD (gastroesophageal reflux disease)   . H/O fracture of humerus 2006  . History of bronchitis   . History of hiatal hernia   . Hyperlipidemia   . Hypertension   . Nocturia   . Osteopenia   . Pancreatitis    History of    HPI:    Connie Rowe, 66 y.o. female, has a history of pain and functional disability in the left hip(s) due to arthritis and patient has failed non-surgical conservative treatments for greater than 12 weeks to include NSAID's and/or analgesics and activity modification.  Onset of symptoms was gradual starting ~1 years ago with gradually worsening course since that time.The patient noted prior procedures of the hip to include arthroplasty on the right hip on March 30, 2015 per Dr. Alvan Dame.  Patient currently rates pain in the left hip at 9 out of 10 with activity. Patient has night pain, worsening of pain with activity and weight bearing, trendelenberg gait, pain that interfers with activities of daily living and pain with passive range of motion. Patient has evidence of periarticular osteophytes and joint space narrowing by imaging studies. This condition presents safety issues increasing the risk of falls. There is no current active infection.   Risks, benefits and expectations were discussed with the patient.  Risks including but not limited to the risk of anesthesia, blood clots, nerve damage, blood vessel  damage, failure of the prosthesis, infection and up to and including death.  Patient understand the risks, benefits and expectations and wishes to proceed with surgery.   PCP: Abner Greenspan, MD   Discharged Condition: good  Hospital Course:  Patient underwent the above stated procedure on 01/09/2017. Patient tolerated the procedure well and brought to the recovery room in good condition and subsequently to the floor.  POD #1 BP: 104/49 ; Pulse: 74 ; Temp: 97.7 F (36.5 C) ; Resp: 15 Patient reports pain as mild, pain controlled. No major events throughout the night.  She does c/o itching, but she states analgesic medications will cause itching. We have discussed the use of benadryl.  She also states that she didn't get much sleep.  Looking forward to getting home.  Ready to be discharged home. Dorsiflexion/plantar flexion intact, incision: dressing C/D/I, no cellulitis present and compartment soft.   LABS  Basename    HGB     10.7  HCT     33.0    Discharge Exam: General appearance: alert, cooperative and no distress Extremities: Homans sign is negative, no sign of DVT, no edema, redness or tenderness in the calves or thighs and no ulcers, gangrene or trophic changes  Disposition: Home with follow up in 2 weeks   Follow-up Information    Paralee Cancel, MD. Schedule an appointment as soon as possible for a visit in 2 week(s).   Specialty:  Orthopedic Surgery Contact information: [address] [place]  [place] [phone]           Discharge Instructions    Call MD / Call 911   Complete by:  As directed    If you experience chest pain or shortness of breath, CALL 911 and be transported to the hospital emergency room.  If you develope a fever above 101 F, pus (white drainage) or increased drainage or redness at the wound, or calf pain, call your surgeon's office.   Change dressing   Complete by:  As directed    Maintain surgical dressing until follow  up in the clinic. If the edges start to pull up, may reinforce with tape. If the dressing is no longer working, may remove and cover with gauze and tape, but must keep the area dry and clean.  Call with any questions or concerns.   Constipation Prevention   Complete by:  As directed    Drink plenty of fluids.  Prune juice may be helpful.  You may use a stool softener, such as Colace (over the counter) 100 mg twice a day.  Use MiraLax (over the counter) for constipation as needed.   Diet - low sodium heart healthy   Complete by:  As directed    Discharge instructions   Complete by:  As directed    Maintain surgical dressing until follow up in the clinic. If the edges start to pull up, may reinforce with tape. If the dressing is no longer working, may remove and cover with gauze and tape, but must keep the area dry and clean.  Follow up in 2 weeks at [facility]. Call with any questions or concerns.   Increase activity slowly as tolerated   Complete by:  As directed    Weight bearing as tolerated with assist device (walker, cane, etc) as directed, use it as long as suggested by your surgeon or therapist, typically at least 4-6 weeks.   TED hose   Complete by:  As directed    Use stockings (TED hose) for 2 weeks on both leg(s).  You may remove them at night for sleeping.      Allergies as of 01/10/2017      Reactions   Ace Inhibitors Cough   Ambien [zolpidem Tartrate] Other (See Comments)   Affected memory      Medication List    STOP taking these medications   diphenhydramine-acetaminophen 25-500 MG Tabs tablet Commonly known as:  TYLENOL PM   ibuprofen 200 MG tablet Commonly known as:  ADVIL,MOTRIN     TAKE these medications   aspirin 81 MG chewable tablet Commonly known as:  ASPIRIN CHILDRENS Chew 1 tablet (81 mg total) by mouth 2 (two) times daily.   calcium-vitamin D 500-200 MG-UNIT tablet Take 1 tablet by mouth daily.   docusate sodium 100 MG  capsule Commonly known as:  COLACE Take 1 capsule (100 mg total) by mouth 2 (two) times daily.   ferrous sulfate 325 (65 FE) MG tablet Commonly known as:  FERROUSUL Take 1 tablet (325 mg total) by mouth 3 (three) times daily with meals.   FLUoxetine 20 MG capsule Commonly known as:  PROZAC Take 1 capsule (20 mg total) by mouth daily. What changed:  when to take this   hydrochlorothiazide 12.5 MG capsule Commonly known as:  MICROZIDE Take 1 capsule (12.5 mg total) by mouth daily.   HYDROcodone-acetaminophen 7.5-325 MG tablet Commonly known as:  NORCO Take 1-2 tablets by mouth every 4 (four) hours as needed for moderate  pain or severe pain.   methocarbamol 500 MG tablet Commonly known as:  ROBAXIN Take 1 tablet (500 mg total) by mouth every 6 (six) hours as needed for muscle spasms.   MULTIVITAMIN PO Take 1 tablet by mouth daily.   pantoprazole 40 MG tablet Commonly known as:  PROTONIX Take 1 tablet (40 mg total) by mouth 2 (two) times daily.   polyethylene glycol packet Commonly known as:  MIRALAX / GLYCOLAX Take 17 g by mouth 2 (two) times daily. What changed:    how much to take  when to take this   pravastatin 80 MG tablet Commonly known as:  PRAVACHOL Take 1 tablet (80 mg total) by mouth daily. What changed:  when to take this   traZODone 50 MG tablet Commonly known as:  DESYREL Take 0.5-1 tablets (25-50 mg total) by mouth at bedtime as needed for sleep.   VISINE OP Place 1 drop into both eyes as needed (for dry eyes).            Discharge Care Instructions  (From admission, onward)        Start     Ordered   01/10/17 0000  Change dressing    Comments:  Maintain surgical dressing until follow up in the clinic. If the edges start to pull up, may reinforce with tape. If the dressing is no longer working, may remove and cover with gauze and tape, but must keep the area dry and clean.  Call with any questions or concerns.   01/10/17 1324        Signed: West Pugh.    PA-C  01/12/2017, 3:45 PM

## 2017-01-19 DIAGNOSIS — M169 Osteoarthritis of hip, unspecified: Secondary | ICD-10-CM | POA: Diagnosis not present

## 2017-01-19 DIAGNOSIS — G4733 Obstructive sleep apnea (adult) (pediatric): Secondary | ICD-10-CM | POA: Diagnosis not present

## 2017-02-19 DIAGNOSIS — M169 Osteoarthritis of hip, unspecified: Secondary | ICD-10-CM | POA: Diagnosis not present

## 2017-02-19 DIAGNOSIS — G4733 Obstructive sleep apnea (adult) (pediatric): Secondary | ICD-10-CM | POA: Diagnosis not present

## 2017-02-23 DIAGNOSIS — M5432 Sciatica, left side: Secondary | ICD-10-CM | POA: Diagnosis not present

## 2017-02-23 DIAGNOSIS — Z96642 Presence of left artificial hip joint: Secondary | ICD-10-CM | POA: Diagnosis not present

## 2017-02-23 DIAGNOSIS — Z471 Aftercare following joint replacement surgery: Secondary | ICD-10-CM | POA: Diagnosis not present

## 2017-03-20 ENCOUNTER — Encounter: Payer: Self-pay | Admitting: Family Medicine

## 2017-03-20 ENCOUNTER — Ambulatory Visit (INDEPENDENT_AMBULATORY_CARE_PROVIDER_SITE_OTHER): Payer: Medicare HMO | Admitting: Family Medicine

## 2017-03-20 VITALS — BP 122/76 | HR 77 | Temp 97.8°F | Ht 63.75 in | Wt 172.8 lb

## 2017-03-20 DIAGNOSIS — R05 Cough: Secondary | ICD-10-CM

## 2017-03-20 DIAGNOSIS — R058 Other specified cough: Secondary | ICD-10-CM | POA: Insufficient documentation

## 2017-03-20 MED ORDER — HYDROCODONE-HOMATROPINE 5-1.5 MG/5ML PO SYRP: 5.0000 mL | ORAL_SOLUTION | Freq: Four times a day (QID) | ORAL | 0 refills | Status: DC | PRN

## 2017-03-20 MED ORDER — BENZONATATE 200 MG PO CAPS: 200.0000 mg | ORAL_CAPSULE | Freq: Three times a day (TID) | ORAL | 1 refills | Status: DC | PRN

## 2017-03-20 MED ORDER — TRAZODONE HCL 50 MG PO TABS: 25.0000 mg | ORAL_TABLET | Freq: Every evening | ORAL | 11 refills | Status: DC | PRN

## 2017-03-20 NOTE — Patient Instructions (Signed)
For post viral cough syndrome  You can take mucinex DM or delsym as needed Tessalon three times daily  For night time- the hydrocodone cough syrup   Update if not starting to improve in a week or if worsening   For example productive cough or fever or sinus pain please call and let us know

## 2017-03-20 NOTE — Progress Notes (Signed)
Subjective:    Patient ID: NANETTA WIEGMAN, female    DOB: [birth year], 67 y.o.   MRN: [id number]  HPI Here for uri symptoms  Lingering cough- just not getting better   Started with congestion - 2 wk ago  mucinex and benadryl and delsym -limited help   Nasal symptoms are imp  Runny nose   Non prod cough  No wheezing  No fever  Felt tired   Ears itch a bit  Throat is raw from coughing   Coughed all night  Once she starts she cannot stop    Temp: 97.8 F (36.6 C)   Patient Active Problem List   Diagnosis Date Noted  . Post-viral cough syndrome 03/20/2017  . Overweight (BMI 25.0-29.9) 01/10/2017  . Insomnia 11/27/2016  . Daytime somnolence 05/23/2016  . Snoring 05/23/2016  . Estrogen deficiency 10/19/2015  . Need for hepatitis C screening test 09/27/2015  . S/P right THA, AA 03/30/2015  . Pre-operative examination 02/24/2015  . S/P lumbar laminectomy 09/23/2014  . Essential hypertension 06/26/2014  . Encounter for Medicare annual wellness exam 06/03/2014  . Colon cancer screening 06/03/2014  . Sciatica 11/26/2013  . Prediabetes 04/17/2013  . Hirsutism 12/06/2010  . Routine general medical examination at a health care facility 09/10/2010  . Other screening mammogram 09/01/2010  . Disorder of bone and cartilage 10/22/2006  . URINARY INCONTINENCE, STRESS 08/22/2006  . History of Helicobacter pylori infection 04/18/2006  . Hyperlipidemia 04/18/2006  . DEPRESSION 04/18/2006  . EXTERNAL HEMORRHOIDS 04/18/2006  . GERD 04/18/2006  . HIATAL HERNIA 04/18/2006  . OVERACTIVE BLADDER 04/18/2006  . HEMATURIA, MICROSCOPIC 04/18/2006  . GAMMA GLUTAMYL TRANSFERASE, SERUM, ELEVATED 04/18/2006  . PANCREATITIS, HX OF 04/18/2006  . DIVERTICULOSIS, COLON 01/02/2000   Past Medical History:  Diagnosis Date  . Arthritis   . Colon polyps   . Depression   . Esophageal stricture   . Falls   . GERD (gastroesophageal reflux disease)   . H/O fracture of humerus 2006  . History  of bronchitis   . History of hiatal hernia   . Hyperlipidemia   . Hypertension   . Nocturia   . Osteopenia   . Pancreatitis    History of   Past Surgical History:  Procedure Laterality Date  . BLADDER REPAIR    . COLONOSCOPY    . DIAGNOSTIC LAPAROSCOPY     tubal ligation  . DILATION AND CURETTAGE OF UTERUS    . ESOPHAGEAL DILATION    . LUMBAR LAMINECTOMY/DECOMPRESSION MICRODISCECTOMY Right 09/23/2014   Procedure: Laminectomy and Foraminotomy - Lumbar three- lumbar four - right ;  Surgeon: Eustace Moore, MD;  Location: [facility];  Service: Neurosurgery;  Laterality: Right;  . MULTIPLE TOOTH EXTRACTIONS    . TONSILLECTOMY    . TOTAL HIP ARTHROPLASTY Right 03/30/2015   Procedure: RIGHT TOTAL HIP ARTHROPLASTY ANTERIOR APPROACH;  Surgeon: Paralee Cancel, MD;  Location: [facility];  Service: Orthopedics;  Laterality: Right;  . TOTAL HIP ARTHROPLASTY Left 01/09/2017   Procedure: LEFT TOTAL HIP ARTHROPLASTY ANTERIOR APPROACH;  Surgeon: Paralee Cancel, MD;  Location: [facility];  Service: Orthopedics;  Laterality: Left;  70 mins  . TUBAL LIGATION    . UPPER GI ENDOSCOPY     Social History   Tobacco Use  . Smoking status: Never Smoker  . Smokeless tobacco: Never Used  Substance Use Topics  . Alcohol use: No    Alcohol/week: 0.0 oz  . Drug use: No   Family History  Problem Relation  Age of Onset  . Stroke Mother   . Hypertension Mother   . Cancer Mother        colon CA  . Colon cancer Mother   . Drug abuse Son   . Stroke Brother   . Colon polyps Neg Hx   . Crohn's disease Neg Hx   . Pancreatic cancer Neg Hx   . Rectal cancer Neg Hx   . Stomach cancer Neg Hx   . Ulcerative colitis Neg Hx   . Breast cancer Neg Hx    Allergies  Allergen Reactions  . Ace Inhibitors Cough  . Ambien [Zolpidem Tartrate] Other (See Comments)    Affected memory   Current Outpatient Medications on File Prior to Visit  Medication Sig Dispense Refill  . Calcium Carbonate-Vitamin D (CALCIUM-VITAMIN D)  500-200 MG-UNIT per tablet Take 1 tablet by mouth daily.      Marland Kitchen docusate sodium (COLACE) 100 MG capsule Take 1 capsule (100 mg total) by mouth 2 (two) times daily. 10 capsule 0  . ferrous sulfate (FERROUSUL) 325 (65 FE) MG tablet Take 1 tablet (325 mg total) by mouth 3 (three) times daily with meals.  3  . FLUoxetine (PROZAC) 20 MG capsule Take 1 capsule (20 mg total) by mouth daily. (Patient taking differently: Take 20 mg by mouth at bedtime. ) 90 capsule 3  . hydrochlorothiazide (MICROZIDE) 12.5 MG capsule Take 1 capsule (12.5 mg total) by mouth daily. (Patient taking differently: Take 12.5 mg by mouth daily. ) 90 capsule 3  . methocarbamol (ROBAXIN) 500 MG tablet Take 1 tablet (500 mg total) by mouth every 6 (six) hours as needed for muscle spasms. 40 tablet 0  . Multiple Vitamins-Minerals (MULTIVITAMIN PO) Take 1 tablet by mouth daily.    . pantoprazole (PROTONIX) 40 MG tablet Take 1 tablet (40 mg total) by mouth 2 (two) times daily. (Patient taking differently: Take 40 mg by mouth 2 (two) times daily. ) 180 tablet 3  . polyethylene glycol (MIRALAX / GLYCOLAX) packet Take 17 g by mouth 2 (two) times daily. 14 each 0  . pravastatin (PRAVACHOL) 80 MG tablet Take 1 tablet (80 mg total) by mouth daily. (Patient taking differently: Take 80 mg by mouth Nightly. ) 90 tablet 3  . Tetrahydrozoline HCl (VISINE OP) Place 1 drop into both eyes as needed (for dry eyes).     No current facility-administered medications on file prior to visit.     Review of Systems  Constitutional: Positive for appetite change and fatigue. Negative for fever.  HENT: Positive for congestion, postnasal drip, rhinorrhea and sore throat. Negative for ear pain, sinus pressure and sneezing.   Eyes: Negative for pain and discharge.  Respiratory: Positive for cough. Negative for shortness of breath, wheezing and stridor.   Cardiovascular: Negative for chest pain.  Gastrointestinal: Negative for diarrhea, nausea and vomiting.    Genitourinary: Negative for frequency, hematuria and urgency.  Musculoskeletal: Negative for arthralgias and myalgias.  Skin: Negative for rash.  Neurological: Positive for headaches. Negative for dizziness, weakness and light-headedness.  Psychiatric/Behavioral: Negative for confusion and dysphoric mood.       Objective:   Physical Exam  Constitutional: She appears well-developed and well-nourished. No distress.  obese and well appearing   HENT:  Head: Normocephalic and atraumatic.  Right Ear: External ear normal.  Left Ear: External ear normal.  Mouth/Throat: Oropharynx is clear and moist.  Nares are injected and congested  No sinus tenderness Clear rhinorrhea and post nasal drip  Eyes: Conjunctivae and EOM are normal. Pupils are equal, round, and reactive to light. Right eye exhibits no discharge. Left eye exhibits no discharge.  Neck: Normal range of motion. Neck supple.  Cardiovascular: Normal rate and normal heart sounds.  Pulmonary/Chest: Effort normal and breath sounds normal. No respiratory distress. She has no wheezes. She has no rales. She exhibits no tenderness.  Good air exch No rales/rhonchi or wheeze occ dry cough  Lymphadenopathy:    She has no cervical adenopathy.  Neurological: She is alert.  Skin: Skin is warm and dry. No rash noted.  Psychiatric: She has a normal mood and affect.          Assessment & Plan:   Problem List Items Addressed This Visit      Respiratory   Post-viral cough syndrome - Primary    S/p uri with reassuring exam  Px tessalon tid/ hycodan for pm  Can use guif-DM otc during the day Consider prednisone if no further imp  Update if not starting to improve in a week or if worsening    Meds ordered this encounter  Medications  . benzonatate (TESSALON) 200 MG capsule    Sig: Take 1 capsule (200 mg total) by mouth 3 (three) times daily as needed for cough. Swallow whole, do not bite pill    Dispense:  30 capsule    Refill:  1   . HYDROcodone-homatropine (HYCODAN) 5-1.5 MG/5ML syrup    Sig: Take 5 mLs by mouth every 6 (six) hours as needed for cough. Caution may sedate, take when not working or driving    Dispense:  100 mL    Refill:  0  . traZODone (DESYREL) 50 MG tablet    Sig: Take 0.5-1 tablets (25-50 mg total) by mouth at bedtime as needed for sleep.    Dispense:  30 tablet    Refill:  11

## 2017-03-20 NOTE — Assessment & Plan Note (Signed)
S/p uri with reassuring exam  Px tessalon tid/ hycodan for pm  Can use guif-DM otc during the day Consider prednisone if no further imp  Update if not starting to improve in a week or if worsening    Meds ordered this encounter  Medications  . benzonatate (TESSALON) 200 MG capsule    Sig: Take 1 capsule (200 mg total) by mouth 3 (three) times daily as needed for cough. Swallow whole, do not bite pill    Dispense:  30 capsule    Refill:  1  . HYDROcodone-homatropine (HYCODAN) 5-1.5 MG/5ML syrup    Sig: Take 5 mLs by mouth every 6 (six) hours as needed for cough. Caution may sedate, take when not working or driving    Dispense:  100 mL    Refill:  0  . traZODone (DESYREL) 50 MG tablet    Sig: Take 0.5-1 tablets (25-50 mg total) by mouth at bedtime as needed for sleep.    Dispense:  30 tablet    Refill:  11

## 2017-03-22 DIAGNOSIS — M169 Osteoarthritis of hip, unspecified: Secondary | ICD-10-CM | POA: Diagnosis not present

## 2017-03-22 DIAGNOSIS — G4733 Obstructive sleep apnea (adult) (pediatric): Secondary | ICD-10-CM | POA: Diagnosis not present

## 2017-04-04 DIAGNOSIS — Z96642 Presence of left artificial hip joint: Secondary | ICD-10-CM | POA: Diagnosis not present

## 2017-04-04 DIAGNOSIS — Z471 Aftercare following joint replacement surgery: Secondary | ICD-10-CM | POA: Diagnosis not present

## 2017-04-04 DIAGNOSIS — M791 Myalgia, unspecified site: Secondary | ICD-10-CM | POA: Diagnosis not present

## 2017-04-04 DIAGNOSIS — M5432 Sciatica, left side: Secondary | ICD-10-CM | POA: Diagnosis not present

## 2017-04-19 DIAGNOSIS — G4733 Obstructive sleep apnea (adult) (pediatric): Secondary | ICD-10-CM | POA: Diagnosis not present

## 2017-04-19 DIAGNOSIS — M169 Osteoarthritis of hip, unspecified: Secondary | ICD-10-CM | POA: Diagnosis not present

## 2017-05-16 DIAGNOSIS — M5417 Radiculopathy, lumbosacral region: Secondary | ICD-10-CM | POA: Diagnosis not present

## 2017-05-16 DIAGNOSIS — M791 Myalgia, unspecified site: Secondary | ICD-10-CM | POA: Diagnosis not present

## 2017-05-16 DIAGNOSIS — M5136 Other intervertebral disc degeneration, lumbar region: Secondary | ICD-10-CM | POA: Diagnosis not present

## 2017-05-16 DIAGNOSIS — M1612 Unilateral primary osteoarthritis, left hip: Secondary | ICD-10-CM | POA: Diagnosis not present

## 2017-05-20 DIAGNOSIS — M169 Osteoarthritis of hip, unspecified: Secondary | ICD-10-CM | POA: Diagnosis not present

## 2017-05-20 DIAGNOSIS — G4733 Obstructive sleep apnea (adult) (pediatric): Secondary | ICD-10-CM | POA: Diagnosis not present

## 2017-05-23 ENCOUNTER — Telehealth: Payer: Self-pay | Admitting: Family Medicine

## 2017-05-23 DIAGNOSIS — E7800 Pure hypercholesterolemia, unspecified: Secondary | ICD-10-CM

## 2017-05-23 DIAGNOSIS — R7303 Prediabetes: Secondary | ICD-10-CM

## 2017-05-23 DIAGNOSIS — I10 Essential (primary) hypertension: Secondary | ICD-10-CM

## 2017-05-23 NOTE — Telephone Encounter (Signed)
-----   Message from Ellamae Sia sent at 05/22/2017 11:44 AM EDT ----- Regarding: Lab orders for Monday, 4.29.19 Lab orders for a 6 month follow up appt

## 2017-05-28 ENCOUNTER — Other Ambulatory Visit (INDEPENDENT_AMBULATORY_CARE_PROVIDER_SITE_OTHER): Payer: Medicare HMO

## 2017-05-28 ENCOUNTER — Encounter: Payer: Self-pay | Admitting: *Deleted

## 2017-05-28 DIAGNOSIS — I10 Essential (primary) hypertension: Secondary | ICD-10-CM | POA: Diagnosis not present

## 2017-05-28 DIAGNOSIS — R7303 Prediabetes: Secondary | ICD-10-CM | POA: Diagnosis not present

## 2017-05-28 DIAGNOSIS — E7800 Pure hypercholesterolemia, unspecified: Secondary | ICD-10-CM

## 2017-05-28 LAB — CBC WITH DIFFERENTIAL/PLATELET
BASOS ABS: 0 10*3/uL (ref 0.0–0.1)
Basophils Relative: 0.5 % (ref 0.0–3.0)
EOS PCT: 2.4 % (ref 0.0–5.0)
Eosinophils Absolute: 0.2 10*3/uL (ref 0.0–0.7)
HCT: 41.1 % (ref 36.0–46.0)
HEMOGLOBIN: 13.7 g/dL (ref 12.0–15.0)
Lymphocytes Relative: 40.3 % (ref 12.0–46.0)
Lymphs Abs: 2.7 10*3/uL (ref 0.7–4.0)
MCHC: 33.3 g/dL (ref 30.0–36.0)
MCV: 89.1 fl (ref 78.0–100.0)
MONOS PCT: 6.4 % (ref 3.0–12.0)
Monocytes Absolute: 0.4 10*3/uL (ref 0.1–1.0)
Neutro Abs: 3.3 10*3/uL (ref 1.4–7.7)
Neutrophils Relative %: 50.4 % (ref 43.0–77.0)
Platelets: 264 10*3/uL (ref 150.0–400.0)
RBC: 4.61 Mil/uL (ref 3.87–5.11)
RDW: 16 % — ABNORMAL HIGH (ref 11.5–15.5)
WBC: 6.6 10*3/uL (ref 4.0–10.5)

## 2017-05-28 LAB — LIPID PANEL
CHOL/HDL RATIO: 3
Cholesterol: 228 mg/dL — ABNORMAL HIGH (ref 0–200)
HDL: 70.2 mg/dL (ref >39.00)
LDL CALC: 125 mg/dL — AB (ref 0–99)
NONHDL: 158.02
Triglycerides: 166 mg/dL — ABNORMAL HIGH (ref 0.0–149.0)
VLDL: 33.2 mg/dL (ref 0.0–40.0)

## 2017-05-28 LAB — HEMOGLOBIN A1C: HEMOGLOBIN A1C: 6.6 % — AB (ref 4.6–6.5)

## 2017-06-07 ENCOUNTER — Ambulatory Visit: Payer: Medicare HMO | Admitting: Internal Medicine

## 2017-06-07 ENCOUNTER — Encounter: Payer: Self-pay | Admitting: Internal Medicine

## 2017-06-07 VITALS — BP 124/78 | HR 83 | Ht 63.75 in | Wt 175.0 lb

## 2017-06-07 DIAGNOSIS — G4733 Obstructive sleep apnea (adult) (pediatric): Secondary | ICD-10-CM

## 2017-06-07 NOTE — Patient Instructions (Signed)
Continue CPAP therapy as precribed

## 2017-06-07 NOTE — Progress Notes (Signed)
   Name: Connie Rowe MRN: [id number] DOB: 1950/12/24    CONSULTATION DATE: 06/07/2017   REFERRING MD : Glori Bickers  CHIEF COMPLAINT: having sleep problems  STUDIES: CXR 08/2014 I have Independently reviewed images of  CXR   on 06/21/2016 Interpretation:no effusions, no edema, no disease noted    HISTORY OF PRESENT ILLNESS:   67 year-old pleasant white female here today to discuss sleep apnea results and along with CPAP titration results At this time her AHI was 12.8 on the sleep study and upon further assessment of her compliance report patient is on auto CPAP pressure minimum pressure of 8 cm of water pressure maximum pressures 12 cm of water pressure she is 100% compliant in terms of usage days She uses 97% compliance of more than 4 hours per day and her AHI is down to 1.7  She is non-smoker, no etoh abuse Works as Scientist, forensic   No SOB, chest pain, no DOE Has dx of HTN on meds  She has no signs of infection at this time She states she has more refreshed sleep more restful sleep Patient started off with a nasal mask then transition to full mask and then finally transition to nasal pillows which she says works the best     Allergies  Allergen Reactions  . Ace Inhibitors Cough  . Ambien [Zolpidem Tartrate] Other (See Comments)    Affected memory    REVIEW OF SYSTEMS:   See history of present illness  ALL OTHER ROS ARE NEGATIVE    VITAL SIGNs BP 124/78 (BP Location: Right Arm, Cuff Size: Normal)   Pulse 83   Ht 5' 3.75" (1.619 m)   Wt 175 lb (79.4 kg)   SpO2 99%   BMI 30.27 kg/m   Physical Examination:  GENERAL:NAD, no fevers, chills, no weakness no fatigue HEAD: Normocephalic, atraumatic.  EYES: Pupils equal, round, reactive to light. Extraocular muscles intact. No scleral icterus.  MOUTH: Moist mucosal membrane.   EAR, NOSE, THROAT: Clear without exudates. No external lesions. Malanpatti class 3 NECK: Supple. No thyromegaly. No nodules. No JVD.    PULMONARY:CTA B/L no wheezes, no crackles, no rhonchi CARDIOVASCULAR: S1 and S2. Regular rate and rhythm. No murmurs, rubs, or gallops. No edema.  GASTROINTESTINAL: Soft, nontender, nondistended. No masses. Positive bowel sounds.  PSYCHIATRIC: Mood, affect within normal limits. The patient is awake, alert and oriented x 3. Insight, judgment intact.       ASSESSMENT / PLAN: 67 yo pleasant white female with excessive daytime sleepiness and fatigue with very loud snoring findings to suggest Sleep apnea-patient with officially diagnosis of obstructive sleep apnea with an AHI of 12.8 and after further treatment and auto CPAP therapy her AHI is now 1.7  #1 continue auto CPAP therapy 8-12 cm of water pressure #2 deconditioned state patient advised to increase activity and exercise tolerance   Patient satisfied with Plan of action and management. All questions answered Follow up in 1 year   Patricia Pesa, M.D.  [facility]  Medical Director [place] Director [facility] Cardio-Pulmonary Department

## 2017-06-19 DIAGNOSIS — G4733 Obstructive sleep apnea (adult) (pediatric): Secondary | ICD-10-CM | POA: Diagnosis not present

## 2017-06-19 DIAGNOSIS — M169 Osteoarthritis of hip, unspecified: Secondary | ICD-10-CM | POA: Diagnosis not present

## 2017-07-13 ENCOUNTER — Ambulatory Visit (INDEPENDENT_AMBULATORY_CARE_PROVIDER_SITE_OTHER): Payer: Medicare HMO | Admitting: Family Medicine

## 2017-07-13 ENCOUNTER — Encounter: Payer: Self-pay | Admitting: Family Medicine

## 2017-07-13 VITALS — BP 120/72 | HR 95 | Temp 98.5°F | Ht 63.75 in | Wt 177.0 lb

## 2017-07-13 DIAGNOSIS — R05 Cough: Secondary | ICD-10-CM

## 2017-07-13 DIAGNOSIS — R059 Cough, unspecified: Secondary | ICD-10-CM

## 2017-07-13 MED ORDER — GUAIFENESIN-CODEINE 100-10 MG/5ML PO SYRP: 5.0000 mL | ORAL_SOLUTION | Freq: Two times a day (BID) | ORAL | 0 refills | Status: DC | PRN

## 2017-07-13 MED ORDER — FLUTICASONE PROPIONATE 50 MCG/ACT NA SUSP: 2.0000 | Freq: Every day | NASAL | 1 refills | Status: DC

## 2017-07-13 NOTE — Assessment & Plan Note (Addendum)
Benign exam. Anticipate viral URI in addition to possible allergic cough contribution. No signs of bacterial infection.  Rx cheratussin codeine cough syrup, flonase. Update if not improving with treatment. Offered prednisone taper - pt declines.

## 2017-07-13 NOTE — Patient Instructions (Addendum)
I think you have either viral respiratory infection and/or allergic contribution to cough.  Treat with flonase nasal steroid, and cheratussin codeine cough syrup.  Push fluids and rest, let us know if not improving with above.

## 2017-07-13 NOTE — Progress Notes (Signed)
BP 120/72 (BP Location: Left Arm, Patient Position: Sitting, Cuff Size: Normal)   Pulse 95   Temp 98.5 F (36.9 C) (Oral)   Ht 5' 3.75" (1.619 m)   Wt 177 lb (80.3 kg)   SpO2 95%   BMI 30.62 kg/m    CC: cough Subjective:    Patient ID: Connie Rowe, female    DOB: Mar 05, 1950, 67 y.o.   MRN: [id number]  HPI: Connie Rowe is a 67 y.o. female presenting on 07/13/2017 for Cough (Dry cough started about 6 days ago. Worse when lying down. Tried Mucinex, Delsym and [facility], not helpful.) and Sore Throat (Due to drainage.)   6d h/o itchy eyes, nasal drainage, ST, then dry cough started and has progressively been worsening. Cough causing dizziness. Headache from coughing. Mild head congestion.   No fevers/chills, ear or tooth pain, dyspnea or wheezing. No significant GERD sxs.   Has been taking mucinex and delsym with no improvement. Also taking benadryl and zyrtec.  No sick contacts at home. Non smoker No h/o asthma.  Did have tick bite last weekend.  Relevant past medical, surgical, family and social history reviewed and updated as indicated. Interim medical history since our last visit reviewed. Allergies and medications reviewed and updated. Outpatient Medications Prior to Visit  Medication Sig Dispense Refill  . Calcium Carbonate-Vitamin D (CALCIUM-VITAMIN D) 500-200 MG-UNIT per tablet Take 1 tablet by mouth daily.      Marland Kitchen docusate sodium (COLACE) 100 MG capsule Take 1 capsule (100 mg total) by mouth 2 (two) times daily. (Patient taking differently: Take 100 mg by mouth 2 (two) times daily. As needed) 10 capsule 0  . ferrous sulfate (FERROUSUL) 325 (65 FE) MG tablet Take 1 tablet (325 mg total) by mouth 3 (three) times daily with meals.  3  . FLUoxetine (PROZAC) 20 MG capsule Take 1 capsule (20 mg total) by mouth daily. (Patient taking differently: Take 20 mg by mouth at bedtime. ) 90 capsule 3  . hydrochlorothiazide (MICROZIDE) 12.5 MG capsule Take 1 capsule (12.5 mg total) by  mouth daily. (Patient taking differently: Take 12.5 mg by mouth daily. ) 90 capsule 3  . Multiple Vitamins-Minerals (MULTIVITAMIN PO) Take 1 tablet by mouth daily.    . pantoprazole (PROTONIX) 40 MG tablet Take 1 tablet (40 mg total) by mouth 2 (two) times daily. (Patient taking differently: Take 40 mg by mouth 2 (two) times daily. ) 180 tablet 3  . polyethylene glycol (MIRALAX / GLYCOLAX) packet Take 17 g by mouth 2 (two) times daily. (Patient taking differently: Take 17 g by mouth 2 (two) times daily. As needed) 14 each 0  . pravastatin (PRAVACHOL) 80 MG tablet Take 1 tablet (80 mg total) by mouth daily. (Patient taking differently: Take 80 mg by mouth Nightly. ) 90 tablet 3  . Tetrahydrozoline HCl (VISINE OP) Place 1 drop into both eyes as needed (for dry eyes).    . traZODone (DESYREL) 50 MG tablet Take 0.5-1 tablets (25-50 mg total) by mouth at bedtime as needed for sleep. 30 tablet 11  . benzonatate (TESSALON) 200 MG capsule Take 1 capsule (200 mg total) by mouth 3 (three) times daily as needed for cough. Swallow whole, do not bite pill 30 capsule 1  . methocarbamol (ROBAXIN) 500 MG tablet Take 1 tablet (500 mg total) by mouth every 6 (six) hours as needed for muscle spasms. 40 tablet 0   No facility-administered medications prior to visit.      Per HPI  unless specifically indicated in ROS section below Review of Systems     Objective:    BP 120/72 (BP Location: Left Arm, Patient Position: Sitting, Cuff Size: Normal)   Pulse 95   Temp 98.5 F (36.9 C) (Oral)   Ht 5' 3.75" (1.619 m)   Wt 177 lb (80.3 kg)   SpO2 95%   BMI 30.62 kg/m   Wt Readings from Last 3 Encounters:  07/13/17 177 lb (80.3 kg)  06/07/17 175 lb (79.4 kg)  03/20/17 172 lb 12 oz (78.4 kg)    Physical Exam  Constitutional: She appears well-developed and well-nourished. No distress.  HENT:  Head: Normocephalic and atraumatic.  Right Ear: Hearing, tympanic membrane, external ear and ear canal normal.  Left Ear:  Hearing, tympanic membrane, external ear and ear canal normal.  Nose: Mucosal edema present. No rhinorrhea. Right sinus exhibits no maxillary sinus tenderness and no frontal sinus tenderness. Left sinus exhibits no maxillary sinus tenderness and no frontal sinus tenderness.  Mouth/Throat: Uvula is midline, oropharynx is clear and moist and mucous membranes are normal. No oropharyngeal exudate, posterior oropharyngeal edema, posterior oropharyngeal erythema (mild L sided) or tonsillar abscesses.  Eyes: Pupils are equal, round, and reactive to light. Conjunctivae and EOM are normal. No scleral icterus.  Neck: Normal range of motion. Neck supple.  Cardiovascular: Normal rate, regular rhythm, normal heart sounds and intact distal pulses.  No murmur heard. Pulmonary/Chest: Effort normal and breath sounds normal. No respiratory distress. She has no wheezes. She has no rales.  Lungs clear  Lymphadenopathy:    She has no cervical adenopathy.  Skin: Skin is warm and dry. No rash noted.  Nursing note and vitals reviewed.     Assessment & Plan:   Problem List Items Addressed This Visit    Cough - Primary    Benign exam. Anticipate viral URI in addition to possible allergic cough contribution. No signs of bacterial infection.  Rx cheratussin codeine cough syrup, flonase. Update if not improving with treatment. Offered prednisone taper - pt declines.           Meds ordered this encounter  Medications  . guaiFENesin-codeine (CHERATUSSIN AC) 100-10 MG/5ML syrup    Sig: Take 5 mLs by mouth 2 (two) times daily as needed for cough (sedation precautions).    Dispense:  120 mL    Refill:  0  . fluticasone (FLONASE) 50 MCG/ACT nasal spray    Sig: Place 2 sprays into both nostrils daily.    Dispense:  16 g    Refill:  1   No orders of the defined types were placed in this encounter.   Follow up plan: Return if symptoms worsen or fail to improve.  [name] Bush, MD

## 2017-07-20 DIAGNOSIS — G4733 Obstructive sleep apnea (adult) (pediatric): Secondary | ICD-10-CM | POA: Diagnosis not present

## 2017-07-20 DIAGNOSIS — M169 Osteoarthritis of hip, unspecified: Secondary | ICD-10-CM | POA: Diagnosis not present

## 2017-08-19 DIAGNOSIS — M169 Osteoarthritis of hip, unspecified: Secondary | ICD-10-CM | POA: Diagnosis not present

## 2017-08-19 DIAGNOSIS — G4733 Obstructive sleep apnea (adult) (pediatric): Secondary | ICD-10-CM | POA: Diagnosis not present

## 2017-09-03 ENCOUNTER — Ambulatory Visit (INDEPENDENT_AMBULATORY_CARE_PROVIDER_SITE_OTHER): Payer: Medicare HMO | Admitting: Family Medicine

## 2017-09-03 ENCOUNTER — Encounter: Payer: Self-pay | Admitting: Family Medicine

## 2017-09-03 VITALS — BP 126/78 | HR 83 | Temp 98.2°F | Ht 63.75 in | Wt 177.8 lb

## 2017-09-03 DIAGNOSIS — J029 Acute pharyngitis, unspecified: Secondary | ICD-10-CM

## 2017-09-03 DIAGNOSIS — J309 Allergic rhinitis, unspecified: Secondary | ICD-10-CM | POA: Insufficient documentation

## 2017-09-03 DIAGNOSIS — J301 Allergic rhinitis due to pollen: Secondary | ICD-10-CM

## 2017-09-03 DIAGNOSIS — K219 Gastro-esophageal reflux disease without esophagitis: Secondary | ICD-10-CM

## 2017-09-03 LAB — POCT RAPID STREP A (OFFICE): Rapid Strep A Screen: NEGATIVE

## 2017-09-03 MED ORDER — RANITIDINE HCL 150 MG PO TABS: 150.0000 mg | ORAL_TABLET | Freq: Two times a day (BID) | ORAL | 5 refills | Status: DC

## 2017-09-03 NOTE — Patient Instructions (Signed)
Continue protonix  Add zantac 150 mg twice daily (this helps reflux and allergies)   Get back on zyrtec 10 mg once daily  flonase - twice daily for 3 days and then once daily   Avoid allergens when you can   Update if not starting to improve in a week or if worsening

## 2017-09-03 NOTE — Progress Notes (Signed)
Subjective:    Patient ID: Connie Rowe, female    DOB: March 28, 1950, 67 y.o.   MRN: [id number]  HPI  Here for c/o ST and swollen glands Also facial pain   Wt Readings from Last 3 Encounters:  09/03/17 177 lb 12 oz (80.6 kg)  07/13/17 177 lb (80.3 kg)  06/07/17 175 lb (79.4 kg)   30.75 kg/m   Results for orders placed or performed in visit on 09/03/17  Rapid Strep A  Result Value Ref Range   Rapid Strep A Screen Negative Negative    Symptoms started Friday night  Hurt to swallow on L side Pain travels to ear  Feels like muscles in neck are sore  About the same   Some coughing and post nasal drip (non prod cough)  Takes zyrtec on and off -not this week  flonase ns -no this week   Some pain in sinuses behind/above eyes  Worse after sneezing  Not blowing nose much  Does not spit out phlegm    Some increased GERD symptoms also - takes protonix 40 mg  Heartburn /occ acid in throat  No change in diet  Some stress   Patient Active Problem List   Diagnosis Date Noted  . Allergic rhinitis 09/03/2017  . Post-viral cough syndrome 03/20/2017  . Overweight (BMI 25.0-29.9) 01/10/2017  . Insomnia 11/27/2016  . Daytime somnolence 05/23/2016  . Snoring 05/23/2016  . Estrogen deficiency 10/19/2015  . Need for hepatitis C screening test 09/27/2015  . S/P right THA, AA 03/30/2015  . Pre-operative examination 02/24/2015  . S/P lumbar laminectomy 09/23/2014  . Cough 08/21/2014  . Essential hypertension 06/26/2014  . Encounter for Medicare annual wellness exam 06/03/2014  . Colon cancer screening 06/03/2014  . Sciatica 11/26/2013  . Prediabetes 04/17/2013  . Hirsutism 12/06/2010  . Routine general medical examination at a health care facility 09/10/2010  . Other screening mammogram 09/01/2010  . Disorder of bone and cartilage 10/22/2006  . URINARY INCONTINENCE, STRESS 08/22/2006  . History of Helicobacter pylori infection 04/18/2006  . Hyperlipidemia 04/18/2006  .  DEPRESSION 04/18/2006  . EXTERNAL HEMORRHOIDS 04/18/2006  . GERD 04/18/2006  . HIATAL HERNIA 04/18/2006  . OVERACTIVE BLADDER 04/18/2006  . HEMATURIA, MICROSCOPIC 04/18/2006  . GAMMA GLUTAMYL TRANSFERASE, SERUM, ELEVATED 04/18/2006  . PANCREATITIS, HX OF 04/18/2006  . DIVERTICULOSIS, COLON 01/02/2000   Past Medical History:  Diagnosis Date  . Arthritis   . Colon polyps   . Depression   . Esophageal stricture   . Falls   . GERD (gastroesophageal reflux disease)   . H/O fracture of humerus 2006  . History of bronchitis   . History of hiatal hernia   . Hyperlipidemia   . Hypertension   . Nocturia   . Osteopenia   . Pancreatitis    History of   Past Surgical History:  Procedure Laterality Date  . BLADDER REPAIR    . COLONOSCOPY    . DIAGNOSTIC LAPAROSCOPY     tubal ligation  . DILATION AND CURETTAGE OF UTERUS    . ESOPHAGEAL DILATION    . LUMBAR LAMINECTOMY/DECOMPRESSION MICRODISCECTOMY Right 09/23/2014   Procedure: Laminectomy and Foraminotomy - Lumbar three- lumbar four - right ;  Surgeon: Eustace Moore, MD;  Location: [facility];  Service: Neurosurgery;  Laterality: Right;  . MULTIPLE TOOTH EXTRACTIONS    . TONSILLECTOMY    . TOTAL HIP ARTHROPLASTY Right 03/30/2015   Procedure: RIGHT TOTAL HIP ARTHROPLASTY ANTERIOR APPROACH;  Surgeon: Paralee Cancel, MD;  Location: [facility];  Service: Orthopedics;  Laterality: Right;  . TOTAL HIP ARTHROPLASTY Left 01/09/2017   Procedure: LEFT TOTAL HIP ARTHROPLASTY ANTERIOR APPROACH;  Surgeon: Paralee Cancel, MD;  Location: [facility];  Service: Orthopedics;  Laterality: Left;  70 mins  . TUBAL LIGATION    . UPPER GI ENDOSCOPY     Social History   Tobacco Use  . Smoking status: Never Smoker  . Smokeless tobacco: Never Used  Substance Use Topics  . Alcohol use: No    Alcohol/week: 0.0 oz  . Drug use: No   Family History  Problem Relation Age of Onset  . Stroke Mother   . Hypertension Mother   . Cancer Mother        colon CA  .  Colon cancer Mother   . Drug abuse Son   . Stroke Brother   . Colon polyps Neg Hx   . Crohn's disease Neg Hx   . Pancreatic cancer Neg Hx   . Rectal cancer Neg Hx   . Stomach cancer Neg Hx   . Ulcerative colitis Neg Hx   . Breast cancer Neg Hx    Allergies  Allergen Reactions  . Ace Inhibitors Cough  . Ambien [Zolpidem Tartrate] Other (See Comments)    Affected memory   Current Outpatient Medications on File Prior to Visit  Medication Sig Dispense Refill  . Calcium Carbonate-Vitamin D (CALCIUM-VITAMIN D) 500-200 MG-UNIT per tablet Take 1 tablet by mouth daily.      Marland Kitchen docusate sodium (COLACE) 100 MG capsule Take 1 capsule (100 mg total) by mouth 2 (two) times daily. (Patient taking differently: Take 100 mg by mouth 2 (two) times daily. As needed) 10 capsule 0  . ferrous sulfate (FERROUSUL) 325 (65 FE) MG tablet Take 1 tablet (325 mg total) by mouth 3 (three) times daily with meals.  3  . FLUoxetine (PROZAC) 20 MG capsule Take 1 capsule (20 mg total) by mouth daily. (Patient taking differently: Take 20 mg by mouth at bedtime. ) 90 capsule 3  . fluticasone (FLONASE) 50 MCG/ACT nasal spray Place 2 sprays into both nostrils daily. 16 g 1  . guaiFENesin-codeine (CHERATUSSIN AC) 100-10 MG/5ML syrup Take 5 mLs by mouth 2 (two) times daily as needed for cough (sedation precautions). 120 mL 0  . hydrochlorothiazide (MICROZIDE) 12.5 MG capsule Take 1 capsule (12.5 mg total) by mouth daily. (Patient taking differently: Take 12.5 mg by mouth daily. ) 90 capsule 3  . Multiple Vitamins-Minerals (MULTIVITAMIN PO) Take 1 tablet by mouth daily.    . pantoprazole (PROTONIX) 40 MG tablet Take 1 tablet (40 mg total) by mouth 2 (two) times daily. (Patient taking differently: Take 40 mg by mouth 2 (two) times daily. ) 180 tablet 3  . pravastatin (PRAVACHOL) 80 MG tablet Take 1 tablet (80 mg total) by mouth daily. (Patient taking differently: Take 80 mg by mouth Nightly. ) 90 tablet 3  . Tetrahydrozoline HCl  (VISINE OP) Place 1 drop into both eyes as needed (for dry eyes).    . traZODone (DESYREL) 50 MG tablet Take 0.5-1 tablets (25-50 mg total) by mouth at bedtime as needed for sleep. 30 tablet 11   No current facility-administered medications on file prior to visit.      Review of Systems  Constitutional: Positive for appetite change and fatigue. Negative for fever.  HENT: Positive for congestion, postnasal drip, rhinorrhea, sinus pressure, sneezing, sore throat and voice change. Negative for ear pain.   Eyes: Negative for pain  and discharge.  Respiratory: Positive for cough. Negative for shortness of breath, wheezing and stridor.   Cardiovascular: Negative for chest pain.  Gastrointestinal: Negative for abdominal distention, abdominal pain, diarrhea, nausea and vomiting.       Worse heartburn  Acid in throat   Genitourinary: Negative for frequency, hematuria and urgency.  Musculoskeletal: Negative for arthralgias and myalgias.  Skin: Negative for rash.  Neurological: Positive for headaches. Negative for dizziness, weakness and light-headedness.  Psychiatric/Behavioral: Negative for confusion and dysphoric mood.       Objective:   Physical Exam  Constitutional: She appears well-developed and well-nourished. No distress.  obese and well appearing   HENT:  Head: Normocephalic and atraumatic.  Right Ear: External ear normal.  Left Ear: External ear normal.  Mouth/Throat: Oropharynx is clear and moist. No oropharyngeal exudate.  Nares are injected and congested  No sinus tenderness Clear rhinorrhea and post nasal drip   Clears throat often  Eyes: Pupils are equal, round, and reactive to light. Conjunctivae and EOM are normal. Right eye exhibits no discharge. Left eye exhibits no discharge. No scleral icterus.  No conjunctival injection   Neck: Normal range of motion. Neck supple.  Cardiovascular: Normal rate, regular rhythm and normal heart sounds.  Pulmonary/Chest: Effort normal  and breath sounds normal. No stridor. No respiratory distress. She has no wheezes. She has no rales. She exhibits no tenderness.  Good air exch  Cough is dry/hacking No wheeze even on forced exp  Abdominal: Soft. Bowel sounds are normal. She exhibits no distension and no mass. There is no tenderness. There is no rebound and no guarding.  Musculoskeletal: She exhibits no edema.  Lymphadenopathy:    She has no cervical adenopathy.  Neurological: She is alert. No cranial nerve deficit. Coordination normal.  Skin: Skin is warm and dry. No rash noted. No erythema.  Psychiatric: She has a normal mood and affect.  Pleasant           Assessment & Plan:   Problem List Items Addressed This Visit      Respiratory   Allergic rhinitis - Primary    With ST/pnd and congestion  No purulent drainage Timing matched stopping allergy medicines Will re start zyrtec 10 mg daily flonase bid for 3-4 d then daily  Adding zantac for both inc gerd symptoms and allergies as well  Update if not starting to improve in a week or if worsening  -esp if worse sinus pain or purulent nasal d/c        Digestive   GERD    With hx of esoph stricture and HH in obese female  Disc diet habits Also ST/hoarseness-this could exacerbate it  Will continue ppi Add zantac 150 mg bid (H2 blocker will hopefully help GERD and allergies)  Update if not starting to improve in a week or if worsening        Relevant Medications   ranitidine (ZANTAC) 150 MG tablet    Other Visit Diagnoses    Sore throat       Relevant Orders   Rapid Strep A (Completed)

## 2017-09-03 NOTE — Assessment & Plan Note (Signed)
With ST/pnd and congestion  No purulent drainage Timing matched stopping allergy medicines Will re start zyrtec 10 mg daily flonase bid for 3-4 d then daily  Adding zantac for both inc gerd symptoms and allergies as well  Update if not starting to improve in a week or if worsening  -esp if worse sinus pain or purulent nasal d/c

## 2017-09-03 NOTE — Assessment & Plan Note (Signed)
With hx of esoph stricture and HH in obese female  Disc diet habits Also ST/hoarseness-this could exacerbate it  Will continue ppi Add zantac 150 mg bid (H2 blocker will hopefully help GERD and allergies)  Update if not starting to improve in a week or if worsening

## 2017-09-19 DIAGNOSIS — G4733 Obstructive sleep apnea (adult) (pediatric): Secondary | ICD-10-CM | POA: Diagnosis not present

## 2017-09-19 DIAGNOSIS — M169 Osteoarthritis of hip, unspecified: Secondary | ICD-10-CM | POA: Diagnosis not present

## 2017-11-27 ENCOUNTER — Ambulatory Visit: Payer: Medicare HMO

## 2017-11-28 ENCOUNTER — Telehealth: Payer: Self-pay | Admitting: Family Medicine

## 2017-11-28 DIAGNOSIS — E7800 Pure hypercholesterolemia, unspecified: Secondary | ICD-10-CM

## 2017-11-28 DIAGNOSIS — I10 Essential (primary) hypertension: Secondary | ICD-10-CM

## 2017-11-28 DIAGNOSIS — R7303 Prediabetes: Secondary | ICD-10-CM

## 2017-11-28 NOTE — Telephone Encounter (Signed)
-----   Message from Eustace Pen, LPN sent at [phone]  3:16 PM EDT ----- Regarding: Labs 10/31 Lab orders needed. Thank you.  Insurance:  [employer]

## 2017-11-29 ENCOUNTER — Ambulatory Visit (INDEPENDENT_AMBULATORY_CARE_PROVIDER_SITE_OTHER): Payer: Medicare HMO

## 2017-11-29 VITALS — BP 130/84 | HR 70 | Temp 97.8°F | Ht 64.5 in | Wt 176.2 lb

## 2017-11-29 DIAGNOSIS — R7303 Prediabetes: Secondary | ICD-10-CM

## 2017-11-29 DIAGNOSIS — I10 Essential (primary) hypertension: Secondary | ICD-10-CM | POA: Diagnosis not present

## 2017-11-29 DIAGNOSIS — Z23 Encounter for immunization: Secondary | ICD-10-CM | POA: Diagnosis not present

## 2017-11-29 DIAGNOSIS — E7800 Pure hypercholesterolemia, unspecified: Secondary | ICD-10-CM

## 2017-11-29 DIAGNOSIS — Z0000 Encounter for general adult medical examination without abnormal findings: Secondary | ICD-10-CM | POA: Diagnosis not present

## 2017-11-29 LAB — LIPID PANEL
Cholesterol: 177 mg/dL (ref 0–200)
HDL: 53 mg/dL (ref >39.00)
LDL CALC: 86 mg/dL (ref 0–99)
NonHDL: 123.55
Total CHOL/HDL Ratio: 3
Triglycerides: 189 mg/dL — ABNORMAL HIGH (ref 0.0–149.0)
VLDL: 37.8 mg/dL (ref 0.0–40.0)

## 2017-11-29 LAB — COMPREHENSIVE METABOLIC PANEL
ALBUMIN: 4.4 g/dL (ref 3.5–5.2)
ALK PHOS: 69 U/L (ref 39–117)
ALT: 18 U/L (ref 0–35)
AST: 21 U/L (ref 0–37)
BUN: 15 mg/dL (ref 6–23)
CALCIUM: 9.5 mg/dL (ref 8.4–10.5)
CO2: 32 mEq/L (ref 19–32)
Chloride: 100 mEq/L (ref 96–112)
Creatinine, Ser: 1.12 mg/dL (ref 0.40–1.20)
GFR: 51.22 mL/min — AB (ref >60.00)
GLUCOSE: 141 mg/dL — AB (ref 70–99)
POTASSIUM: 4.3 meq/L (ref 3.5–5.1)
Sodium: 137 mEq/L (ref 135–145)
TOTAL PROTEIN: 7.2 g/dL (ref 6.0–8.3)
Total Bilirubin: 0.6 mg/dL (ref 0.2–1.2)

## 2017-11-29 LAB — CBC WITH DIFFERENTIAL/PLATELET
BASOS ABS: 0 10*3/uL (ref 0.0–0.1)
Basophils Relative: 0.6 % (ref 0.0–3.0)
Eosinophils Absolute: 0.1 10*3/uL (ref 0.0–0.7)
Eosinophils Relative: 2.6 % (ref 0.0–5.0)
HEMATOCRIT: 40.5 % (ref 36.0–46.0)
Hemoglobin: 13.5 g/dL (ref 12.0–15.0)
LYMPHS PCT: 44.1 % (ref 12.0–46.0)
Lymphs Abs: 2.4 10*3/uL (ref 0.7–4.0)
MCHC: 33.4 g/dL (ref 30.0–36.0)
MCV: 88.3 fl (ref 78.0–100.0)
MONOS PCT: 7.5 % (ref 3.0–12.0)
Monocytes Absolute: 0.4 10*3/uL (ref 0.1–1.0)
Neutro Abs: 2.5 10*3/uL (ref 1.4–7.7)
Neutrophils Relative %: 45.2 % (ref 43.0–77.0)
PLATELETS: 238 10*3/uL (ref 150.0–400.0)
RBC: 4.59 Mil/uL (ref 3.87–5.11)
RDW: 14.7 % (ref 11.5–15.5)
WBC: 5.5 10*3/uL (ref 4.0–10.5)

## 2017-11-29 LAB — TSH: TSH: 1.09 u[IU]/mL (ref 0.35–4.50)

## 2017-11-29 LAB — HEMOGLOBIN A1C: HEMOGLOBIN A1C: 7.7 % — AB (ref 4.6–6.5)

## 2017-11-29 NOTE — Progress Notes (Signed)
Subjective:   Connie Rowe is a 67 y.o. female who presents for Medicare Annual (Subsequent) preventive examination.  Review of Systems:  N/A Cardiac Risk Factors include: advanced age (>93men, >76 women);hypertension;dyslipidemia     Objective:     Vitals: BP 130/84 (BP Location: Right Arm, Patient Position: Sitting, Cuff Size: Normal)   Pulse 70   Temp 97.8 F (36.6 C) (Oral)   Ht 5' 4.5" (1.638 m) Comment: shoes  Wt 176 lb 4 oz (79.9 kg)   SpO2 92%   BMI 29.79 kg/m   Body mass index is 29.79 kg/m.  Advanced Directives 11/29/2017 01/09/2017 01/05/2017 11/15/2016 01/05/2016 10/15/2015 03/30/2015  Does Patient Have a Medical Advance Directive? No No No No No No No  Does patient want to make changes to medical advance directive? Yes (MAU/Ambulatory/Procedural Areas - Information given) - - - - - -  Would patient like information on creating a medical advance directive? - No - Patient declined No - Patient declined Yes (MAU/Ambulatory/Procedural Areas - Information given) - Yes - Educational materials given Yes - Scientist, clinical (histocompatibility and immunogenetics) given    Tobacco Social History   Tobacco Use  Smoking Status Never Smoker  Smokeless Tobacco Never Used     Counseling given: No   Clinical Intake:  Pre-visit preparation completed: Yes  Pain : No/denies pain Pain Score: 0-No pain     Nutritional Status: BMI 25 -29 Overweight Nutritional Risks: None Diabetes: No  How often do you need to have someone help you when you read instructions, pamphlets, or other written materials from your doctor or pharmacy?: 1 - Never What is the last grade level you completed in school?: 12th grade + some college courses  Interpreter Needed?: No  Comments: pt lives with spouse Information entered by :: LPinson, LPN  Past Medical History:  Diagnosis Date  . Arthritis   . Colon polyps   . Depression   . Esophageal stricture   . Falls   . GERD (gastroesophageal reflux disease)   . H/O  fracture of humerus 2006  . History of bronchitis   . History of hiatal hernia   . Hyperlipidemia   . Hypertension   . Nocturia   . Osteopenia   . Pancreatitis    History of   Past Surgical History:  Procedure Laterality Date  . BLADDER REPAIR    . COLONOSCOPY    . DIAGNOSTIC LAPAROSCOPY     tubal ligation  . DILATION AND CURETTAGE OF UTERUS    . ESOPHAGEAL DILATION    . LUMBAR LAMINECTOMY/DECOMPRESSION MICRODISCECTOMY Right 09/23/2014   Procedure: Laminectomy and Foraminotomy - Lumbar three- lumbar four - right ;  Surgeon: Eustace Moore, MD;  Location: [place] [facility];  Service: Neurosurgery;  Laterality: Right;  . MULTIPLE TOOTH EXTRACTIONS    . TONSILLECTOMY    . TOTAL HIP ARTHROPLASTY Right 03/30/2015   Procedure: RIGHT TOTAL HIP ARTHROPLASTY ANTERIOR APPROACH;  Surgeon: Paralee Cancel, MD;  Location: [facility];  Service: Orthopedics;  Laterality: Right;  . TOTAL HIP ARTHROPLASTY Left 01/09/2017   Procedure: LEFT TOTAL HIP ARTHROPLASTY ANTERIOR APPROACH;  Surgeon: Paralee Cancel, MD;  Location: [facility];  Service: Orthopedics;  Laterality: Left;  70 mins  . TUBAL LIGATION    . UPPER GI ENDOSCOPY     Family History  Problem Relation Age of Onset  . Stroke Mother   . Hypertension Mother   . Cancer Mother        colon CA  . Colon cancer Mother   .  Drug abuse Son   . Stroke Brother   . Colon polyps Neg Hx   . Crohn's disease Neg Hx   . Pancreatic cancer Neg Hx   . Rectal cancer Neg Hx   . Stomach cancer Neg Hx   . Ulcerative colitis Neg Hx   . Breast cancer Neg Hx    Social History   Socioeconomic History  . Marital status: Married    Spouse name: Not on file  . Number of children: Not on file  . Years of education: Not on file  . Highest education level: Not on file  Occupational History  . Not on file  Social Needs  . Financial resource strain: Not on file  . Food insecurity:    Worry: Not on file    Inability: Not on file  . Transportation needs:    Medical: Not on  file    Non-medical: Not on file  Tobacco Use  . Smoking status: Never Smoker  . Smokeless tobacco: Never Used  Substance and Sexual Activity  . Alcohol use: No    Alcohol/week: 0.0 standard drinks  . Drug use: No  . Sexual activity: Yes  Lifestyle  . Physical activity:    Days per week: Not on file    Minutes per session: Not on file  . Stress: Not on file  Relationships  . Social connections:    Talks on phone: Not on file    Gets together: Not on file    Attends religious service: Not on file    Active member of club or organization: Not on file    Attends meetings of clubs or organizations: Not on file    Relationship status: Not on file  Other Topics Concern  . Not on file  Social History Narrative  . Not on file    Outpatient Encounter Medications as of 11/29/2017  Medication Sig  . Calcium Carbonate-Vitamin D (CALCIUM-VITAMIN D) 500-200 MG-UNIT per tablet Take 1 tablet by mouth daily.    Marland Kitchen FLUoxetine (PROZAC) 20 MG capsule Take 1 capsule (20 mg total) by mouth daily. (Patient taking differently: Take 20 mg by mouth at bedtime. )  . fluticasone (FLONASE) 50 MCG/ACT nasal spray Place 2 sprays into both nostrils daily.  Marland Kitchen guaiFENesin-codeine (CHERATUSSIN AC) 100-10 MG/5ML syrup Take 5 mLs by mouth 2 (two) times daily as needed for cough (sedation precautions).  . hydrochlorothiazide (MICROZIDE) 12.5 MG capsule Take 1 capsule (12.5 mg total) by mouth daily. (Patient taking differently: Take 12.5 mg by mouth daily. )  . Multiple Vitamins-Minerals (MULTIVITAMIN PO) Take 1 tablet by mouth daily.  . pantoprazole (PROTONIX) 40 MG tablet Take 1 tablet (40 mg total) by mouth 2 (two) times daily. (Patient taking differently: Take 40 mg by mouth 2 (two) times daily. )  . pravastatin (PRAVACHOL) 80 MG tablet Take 1 tablet (80 mg total) by mouth daily. (Patient taking differently: Take 80 mg by mouth Nightly. )  . ranitidine (ZANTAC) 150 MG tablet Take 1 tablet (150 mg total) by mouth  2 (two) times daily.  . Tetrahydrozoline HCl (VISINE OP) Place 1 drop into both eyes as needed (for dry eyes).  . traZODone (DESYREL) 50 MG tablet Take 0.5-1 tablets (25-50 mg total) by mouth at bedtime as needed for sleep.  Marland Kitchen docusate sodium (COLACE) 100 MG capsule Take 1 capsule (100 mg total) by mouth 2 (two) times daily. (Patient not taking: Reported on 11/29/2017)  . ferrous sulfate (FERROUSUL) 325 (65 FE) MG tablet Take  1 tablet (325 mg total) by mouth 3 (three) times daily with meals. (Patient not taking: Reported on 11/29/2017)   No facility-administered encounter medications on file as of 11/29/2017.     Activities of Daily Living In your present state of health, do you have any difficulty performing the following activities: 11/29/2017 01/09/2017  Hearing? N N  Vision? N N  Difficulty concentrating or making decisions? N N  Walking or climbing stairs? N Y  Comment - -  Dressing or bathing? N N  Doing errands, shopping? N N  Preparing Food and eating ? N -  Using the Toilet? N -  In the past six months, have you accidently leaked urine? N -  Do you have problems with loss of bowel control? N -  Managing your Medications? N -  Managing your Finances? N -  Housekeeping or managing your Housekeeping? N -  Some recent data might be hidden    Patient Care Team: Tower, Wynelle Fanny, MD as PCP - General    Assessment:   This is a routine wellness examination for [facility].   Hearing Screening   125Hz  250Hz  500Hz  1000Hz  2000Hz  3000Hz  4000Hz  6000Hz  8000Hz   Right ear:   40 40 40  40    Left ear:   40 40 40  40    Vision Screening Comments: Vision exam at CPE   Exercise Activities and Dietary recommendations Current Exercise Habits: Home exercise routine, Type of exercise: walking, Time (Minutes): 15, Frequency (Times/Week): 7, Weekly Exercise (Minutes/Week): 105, Intensity: Mild, Exercise limited by: None identified  Goals    . Increase physical activity     Starting 11/29/2017,  I will continue to walk for 15 minutes daily.        Fall Risk Fall Risk  11/29/2017 11/15/2016 10/15/2015 06/03/2014  Falls in the past year? No No No Yes  Number falls in past yr: - - - 1  Injury with Fall? - - - No   Depression Screen PHQ 2/9 Scores 11/29/2017 11/15/2016 10/15/2015 06/03/2014  PHQ - 2 Score 1 0 0 0  PHQ- 9 Score 1 0 - -     Cognitive Function MMSE - Mini Mental State Exam 11/29/2017 11/15/2016 10/15/2015  Orientation to time 5 5 5   Orientation to Place 5 5 5   Registration 3 3 3   Attention/ Calculation 0 0 0  Recall 3 3 3   Language- name 2 objects 0 0 0  Language- repeat 1 1 1   Language- follow 3 step command 3 3 3   Language- read & follow direction 0 0 0  Write a sentence 0 0 0  Copy design 0 0 0  Total score 20 20 20         Immunization History  Administered Date(s) Administered  . Influenza Split 11/17/2010, 12/14/2011  . Influenza Whole 12/07/2006, 11/05/2007, 11/10/2008, 01/05/2010  . Influenza,inj,Quad PF,6+ Mos 12/17/2012, 12/16/2013, 12/31/2014, 10/15/2015, 11/15/2016, 11/29/2017  . Pneumococcal Conjugate-13 06/03/2014  . Pneumococcal Polysaccharide-23 10/15/2015  . Td 10/01/1995, 10/04/2006  . Zoster 09/29/2013    Screening Tests Health Maintenance  Topic Date Due  . MAMMOGRAM  03/01/2018 (Originally 02/24/2017)  . TETANUS/TDAP  10/03/2026 (Originally 10/03/2016)  . COLONOSCOPY  02/14/2025  . INFLUENZA VACCINE  Completed  . DEXA SCAN  Completed  . Hepatitis C Screening  Completed  . PNA vac Low Risk Adult  Completed      Plan:     I have personally reviewed, addressed, and noted the following in the patient's chart:  A. Medical and social history B. Use of alcohol, tobacco or illicit drugs  C. Current medications and supplements D. Functional ability and status E.  Nutritional status F.  Physical activity G. Advance directives H. List of other physicians I.  Hospitalizations, surgeries, and ER visits in previous 12 months J.    [place] to include hearing, vision, cognitive, depression L. Referrals and appointments - none  In addition, I have reviewed and discussed with patient certain preventive protocols, quality metrics, and best practice recommendations. A written personalized care plan for preventive services as well as general preventive health recommendations were provided to patient.  See attached scanned questionnaire for additional information.   Signed,   Lindell Noe, MHA, BS, LPN Health Coach

## 2017-11-29 NOTE — Progress Notes (Signed)
PCP notes:   Health maintenance:  Flu vaccine - completed  Abnormal screenings:   Depression score: 1 Depression screen [facility] 2/9 11/29/2017 11/15/2016 10/15/2015 06/03/2014  Decreased Interest 0 0 0 0  Down, Depressed, Hopeless 1 0 0 0  PHQ - 2 Score 1 0 0 0  Altered sleeping 0 0 - -  Tired, decreased energy 0 0 - -  Change in appetite 0 0 - -  Feeling bad or failure about yourself  0 0 - -  Trouble concentrating 0 0 - -  Moving slowly or fidgety/restless 0 0 - -  Suicidal thoughts 0 0 - -  PHQ-9 Score 1 0 - -  Difficult doing work/chores Not difficult at all Not difficult at all - -   Patient concerns:   None  Nurse concerns:  None  Next PCP appt:   12/24/17 @ 1430 I reviewed health advisor's note, was available for consultation, and agree with documentation and plan. Loura Pardon MD

## 2017-11-29 NOTE — Patient Instructions (Signed)
Connie Rowe , Thank you for taking time to come for your Medicare Wellness Visit. I appreciate your ongoing commitment to your health goals. Please review the following plan we discussed and let me know if I can assist you in the future.   These are the goals we discussed: Goals    . Increase physical activity     Starting 11/29/2017, I will continue to walk for 15 minutes daily.        This is a list of the screening recommended for you and due dates:  Health Maintenance  Topic Date Due  . Mammogram  03/01/2018*  . Tetanus Vaccine  10/03/2026*  . Colon Cancer Screening  02/14/2025  . Flu Shot  Completed  . DEXA scan (bone density measurement)  Completed  .  Hepatitis C: One time screening is recommended by Center for Disease Control  (CDC) for  adults born from [birth year] through 1967.   Completed  . Pneumonia vaccines  Completed  *Topic was postponed. The date shown is not the original due date.   Preventive Care for Adults  A healthy lifestyle and preventive care can promote health and wellness. Preventive health guidelines for adults include the following key practices.  . A routine yearly physical is a good way to check with your health care provider about your health and preventive screening. It is a chance to share any concerns and updates on your health and to receive a thorough exam.  . Visit your dentist for a routine exam and preventive care every 6 months. Brush your teeth twice a day and floss once a day. Good oral hygiene prevents tooth decay and gum disease.  . The frequency of eye exams is based on your age, health, family medical history, use  of contact lenses, and other factors. Follow your health care provider's recommendations for frequency of eye exams.  . Eat a healthy diet. Foods like vegetables, fruits, whole grains, low-fat dairy products, and lean protein foods contain the nutrients you need without too many calories. Decrease your intake of foods high in solid  fats, added sugars, and salt. Eat the right amount of calories for you. Get information about a proper diet from your health care provider, if necessary.  . Regular physical exercise is one of the most important things you can do for your health. Most adults should get at least 150 minutes of moderate-intensity exercise (any activity that increases your heart rate and causes you to sweat) each week. In addition, most adults need muscle-strengthening exercises on 2 or more days a week.  Silver Sneakers may be a benefit available to you. To determine eligibility, you may visit the website: www.silversneakers.com or contact program at [phone] Mon-Fri between 8AM-8PM.   . Maintain a healthy weight. The body mass index (BMI) is a screening tool to identify possible weight problems. It provides an estimate of body fat based on height and weight. Your health care provider can find your BMI and can help you achieve or maintain a healthy weight.   For adults 20 years and older: ? A BMI below 18.5 is considered underweight. ? A BMI of 18.5 to 24.9 is normal. ? A BMI of 25 to 29.9 is considered overweight. ? A BMI of 30 and above is considered obese.   . Maintain normal blood lipids and cholesterol levels by exercising and minimizing your intake of saturated fat. Eat a balanced diet with plenty of fruit and vegetables. Blood tests for lipids and cholesterol should  begin at age 29 and be repeated every 5 years. If your lipid or cholesterol levels are high, you are over 50, or you are at high risk for heart disease, you may need your cholesterol levels checked more frequently. Ongoing high lipid and cholesterol levels should be treated with medicines if diet and exercise are not working.  . If you smoke, find out from your health care provider how to quit. If you do not use tobacco, please do not start.  . If you choose to drink alcohol, please do not consume more than 2 drinks per day. One drink is  considered to be 12 ounces (355 mL) of beer, 5 ounces (148 mL) of wine, or 1.5 ounces (44 mL) of liquor.  . If you are 16-11 years old, ask your health care provider if you should take aspirin to prevent strokes.  . Use sunscreen. Apply sunscreen liberally and repeatedly throughout the day. You should seek shade when your shadow is shorter than you. Protect yourself by wearing long sleeves, pants, a wide-brimmed hat, and sunglasses year round, whenever you are outdoors.  . Once a month, do a whole body skin exam, using a mirror to look at the skin on your back. Tell your health care provider of new moles, moles that have irregular borders, moles that are larger than a pencil eraser, or moles that have changed in shape or color.

## 2017-11-30 ENCOUNTER — Encounter: Payer: Medicare HMO | Admitting: Family Medicine

## 2017-12-11 ENCOUNTER — Encounter: Payer: Medicare HMO | Admitting: Family Medicine

## 2017-12-13 ENCOUNTER — Other Ambulatory Visit: Payer: Self-pay | Admitting: Family Medicine

## 2017-12-13 DIAGNOSIS — Z1231 Encounter for screening mammogram for malignant neoplasm of breast: Secondary | ICD-10-CM

## 2017-12-24 ENCOUNTER — Encounter: Payer: Medicare HMO | Admitting: Family Medicine

## 2017-12-24 ENCOUNTER — Ambulatory Visit (INDEPENDENT_AMBULATORY_CARE_PROVIDER_SITE_OTHER): Payer: Medicare HMO | Admitting: Family Medicine

## 2017-12-24 ENCOUNTER — Encounter: Payer: Self-pay | Admitting: Family Medicine

## 2017-12-24 VITALS — BP 134/70 | HR 79 | Temp 98.1°F | Ht 64.5 in | Wt 180.8 lb

## 2017-12-24 DIAGNOSIS — Z0000 Encounter for general adult medical examination without abnormal findings: Secondary | ICD-10-CM | POA: Diagnosis not present

## 2017-12-24 DIAGNOSIS — E119 Type 2 diabetes mellitus without complications: Secondary | ICD-10-CM | POA: Diagnosis not present

## 2017-12-24 DIAGNOSIS — E669 Obesity, unspecified: Secondary | ICD-10-CM

## 2017-12-24 DIAGNOSIS — F418 Other specified anxiety disorders: Secondary | ICD-10-CM

## 2017-12-24 DIAGNOSIS — F5101 Primary insomnia: Secondary | ICD-10-CM | POA: Diagnosis not present

## 2017-12-24 DIAGNOSIS — M899 Disorder of bone, unspecified: Secondary | ICD-10-CM

## 2017-12-24 DIAGNOSIS — M949 Disorder of cartilage, unspecified: Secondary | ICD-10-CM | POA: Diagnosis not present

## 2017-12-24 DIAGNOSIS — I10 Essential (primary) hypertension: Secondary | ICD-10-CM

## 2017-12-24 DIAGNOSIS — E7800 Pure hypercholesterolemia, unspecified: Secondary | ICD-10-CM

## 2017-12-24 MED ORDER — METFORMIN HCL ER 500 MG PO TB24: 500.0000 mg | ORAL_TABLET | Freq: Every day | ORAL | 11 refills | Status: DC

## 2017-12-24 MED ORDER — TRAZODONE HCL 50 MG PO TABS: 25.0000 mg | ORAL_TABLET | Freq: Every evening | ORAL | 3 refills | Status: DC | PRN

## 2017-12-24 MED ORDER — FLUOXETINE HCL 40 MG PO CAPS: 40.0000 mg | ORAL_CAPSULE | Freq: Every day | ORAL | 3 refills | Status: DC

## 2017-12-24 MED ORDER — FLUTICASONE PROPIONATE 50 MCG/ACT NA SUSP: 2.0000 | Freq: Every day | NASAL | 3 refills | Status: DC

## 2017-12-24 MED ORDER — PRAVASTATIN SODIUM 80 MG PO TABS: 80.0000 mg | ORAL_TABLET | Freq: Every day | ORAL | 3 refills | Status: DC

## 2017-12-24 MED ORDER — PANTOPRAZOLE SODIUM 40 MG PO TBEC: 40.0000 mg | DELAYED_RELEASE_TABLET | Freq: Two times a day (BID) | ORAL | 3 refills | Status: DC

## 2017-12-24 MED ORDER — HYDROCHLOROTHIAZIDE 12.5 MG PO CAPS: 12.5000 mg | ORAL_CAPSULE | Freq: Every day | ORAL | 3 refills | Status: DC

## 2017-12-24 NOTE — Patient Instructions (Addendum)
Try to get 1200-1500 mg of calcium per day with at least 1000 iu of vitamin D - for bone health   You have diabetes  We will refer you to diabetic teaching (our office will call you about that)  Start low dose metformin xr 500 mg once daily  Try to get most of your carbohydrates from produce (with the exception of white potatoes)  Eat less bread/pasta/rice/snack foods/cereals/sweets and other items from the middle of the grocery store (processed carbs)   Drink less diet drinks and more water   Keep exercising  Start metformin xr once daily in the am  If side effects -stop it and call   Increase prozac to 40 mg once daily   Follow up in 3 months

## 2017-12-24 NOTE — Assessment & Plan Note (Signed)
Discussed how this problem influences overall health and the risks it imposes  Reviewed plan for weight loss with lower calorie diet (via better food choices and also portion control or program like weight watchers) and exercise building up to or more than 30 minutes 5 days per week including some aerobic activity    

## 2017-12-24 NOTE — Assessment & Plan Note (Signed)
Disc goals for lipids and reasons to control them Rev last labs with pt Rev low sat fat diet in detail LDL is well controlled with pravastatin  HDL down-enc her to keep exercising

## 2017-12-24 NOTE — Assessment & Plan Note (Signed)
Struggling more lately with stressors Wishes to inc fluoxetine dose to 40 mg  Disc poss side eff/ update Reviewed stressors/ coping techniques/symptoms/ support sources/ tx options and side effects in detail today Self care encouraged  Enc to keep exercising

## 2017-12-24 NOTE — Assessment & Plan Note (Signed)
Continues trazodone which is helpful

## 2017-12-24 NOTE — Assessment & Plan Note (Signed)
New diagnosis Lab Results  Component Value Date   HGBA1C 7.7 (H) 11/29/2017   Pt was laid up with hip /back injury for months and did not eat healthy  Disc diet for DM- will begin to work on that Also ref to DM education at [facility]  Already on statin  Will discuss foot/eye care at f/u along with renal protection  Plan made for wt loss- she can be more active now Start metformin xr 500 1 daily - alert [place] if side effects  F/u 3 mo

## 2017-12-24 NOTE — Assessment & Plan Note (Signed)
bp in fair control at this time  BP Readings from Last 1 Encounters:  12/24/17 134/70   No changes needed Most recent labs reviewed  Disc lifstyle change with low sodium diet and exercise

## 2017-12-24 NOTE — Progress Notes (Signed)
Subjective:    Patient ID: Connie Rowe, female    DOB: 1950/10/16, 67 y.o.   MRN: [id number]  HPI Here for health maintenance exam and to review chronic medical problems    Doing pretty well overall   Wt Readings from Last 3 Encounters:  12/24/17 180 lb 12 oz (82 kg)  11/29/17 176 lb 4 oz (79.9 kg)  09/03/17 177 lb 12 oz (80.6 kg)  up 4 lb  Joined a gym recently -going 3 d per week / walking in the pool (enjoys it)  Diet- is fairly healthy  30.55 kg/m   Had amw on 10/31 Given flu vaccine Depression score 1   Mammogram 1/18 Has next one scheduled for 02/04/18 Self breast exam - no lumps   (her L breast occ gets sore/laterally)   Colonoscopy 1/17  Fiver year recall due to family hx (mother)   dexa 1/18 -osteopenia in FN Taking calcium with D   zostavax 8/15  Mood Would like to go back up to higher dose of prozac  Stress with grandchild- caring for teen age boy-some struggles    bp is stable today  No cp or palpitations or headaches or edema  No side effects to medicines  BP Readings from Last 3 Encounters:  12/24/17 134/70  11/29/17 130/84  09/03/17 126/78     Hyperlipidemia Lab Results  Component Value Date   CHOL 177 11/29/2017   CHOL 228 (H) 05/28/2017   CHOL 221 (H) 11/15/2016   Lab Results  Component Value Date   HDL 53.00 11/29/2017   HDL 70.20 05/28/2017   HDL 63.60 11/15/2016   Lab Results  Component Value Date   LDLCALC 86 11/29/2017   LDLCALC 125 (H) 05/28/2017   LDLCALC 118 (H) 11/15/2016   Lab Results  Component Value Date   TRIG 189.0 (H) 11/29/2017   TRIG 166.0 (H) 05/28/2017   TRIG 197.0 (H) 11/15/2016   Lab Results  Component Value Date   CHOLHDL 3 11/29/2017   CHOLHDL 3 05/28/2017   CHOLHDL 3 11/15/2016   Lab Results  Component Value Date   LDLDIRECT 126.0 06/03/2014   LDLDIRECT 122.4 09/14/2010   LDLDIRECT 132.2 04/16/2008   Pravastatin and diet  LDL down HDL down also   Prediabetes Lab Results  Component  Value Date   HGBA1C 7.7 (H) 11/29/2017  this is up significantly  No steroid medicine  Just started exercise recently (before that she was sedentary for 4-5 mo) She craves sweets and ate some junk food  This is up from 6.6 Is thirsty lately  Patient Active Problem List   Diagnosis Date Noted  . Allergic rhinitis 09/03/2017  . Post-viral cough syndrome 03/20/2017  . Obesity (BMI 30-39.9) 01/10/2017  . Insomnia 11/27/2016  . Daytime somnolence 05/23/2016  . Snoring 05/23/2016  . Estrogen deficiency 10/19/2015  . Need for hepatitis C screening test 09/27/2015  . S/P right THA, AA 03/30/2015  . Pre-operative examination 02/24/2015  . S/P lumbar laminectomy 09/23/2014  . Cough 08/21/2014  . Essential hypertension 06/26/2014  . Encounter for Medicare annual wellness exam 06/03/2014  . Colon cancer screening 06/03/2014  . Sciatica 11/26/2013  . Controlled type 2 diabetes mellitus without complication, without long-term current use of insulin ([place]) 04/17/2013  . Hirsutism 12/06/2010  . Routine general medical examination at a health care facility 09/10/2010  . Other screening mammogram 09/01/2010  . Disorder of bone and cartilage 10/22/2006  . URINARY INCONTINENCE, STRESS 08/22/2006  . History of Helicobacter  pylori infection 04/18/2006  . Hyperlipidemia 04/18/2006  . DEPRESSION 04/18/2006  . EXTERNAL HEMORRHOIDS 04/18/2006  . GERD 04/18/2006  . HIATAL HERNIA 04/18/2006  . OVERACTIVE BLADDER 04/18/2006  . HEMATURIA, MICROSCOPIC 04/18/2006  . GAMMA GLUTAMYL TRANSFERASE, SERUM, ELEVATED 04/18/2006  . PANCREATITIS, HX OF 04/18/2006  . DIVERTICULOSIS, COLON 01/02/2000   Past Medical History:  Diagnosis Date  . Arthritis   . Colon polyps   . Depression   . Esophageal stricture   . Falls   . GERD (gastroesophageal reflux disease)   . H/O fracture of humerus 2006  . History of bronchitis   . History of hiatal hernia   . Hyperlipidemia   . Hypertension   . Nocturia   .  Osteopenia   . Pancreatitis    History of   Past Surgical History:  Procedure Laterality Date  . BLADDER REPAIR    . COLONOSCOPY    . DIAGNOSTIC LAPAROSCOPY     tubal ligation  . DILATION AND CURETTAGE OF UTERUS    . ESOPHAGEAL DILATION    . LUMBAR LAMINECTOMY/DECOMPRESSION MICRODISCECTOMY Right 09/23/2014   Procedure: Laminectomy and Foraminotomy - Lumbar three- lumbar four - right ;  Surgeon: Eustace Moore, MD;  Location: [facility];  Service: Neurosurgery;  Laterality: Right;  . MULTIPLE TOOTH EXTRACTIONS    . TONSILLECTOMY    . TOTAL HIP ARTHROPLASTY Right 03/30/2015   Procedure: RIGHT TOTAL HIP ARTHROPLASTY ANTERIOR APPROACH;  Surgeon: Paralee Cancel, MD;  Location: [facility];  Service: Orthopedics;  Laterality: Right;  . TOTAL HIP ARTHROPLASTY Left 01/09/2017   Procedure: LEFT TOTAL HIP ARTHROPLASTY ANTERIOR APPROACH;  Surgeon: Paralee Cancel, MD;  Location: [facility];  Service: Orthopedics;  Laterality: Left;  70 mins  . TUBAL LIGATION    . UPPER GI ENDOSCOPY     Social History   Tobacco Use  . Smoking status: Never Smoker  . Smokeless tobacco: Never Used  Substance Use Topics  . Alcohol use: No    Alcohol/week: 0.0 standard drinks  . Drug use: No   Family History  Problem Relation Age of Onset  . Stroke Mother   . Hypertension Mother   . Cancer Mother        colon CA  . Colon cancer Mother   . Drug abuse Son   . Stroke Brother   . Colon polyps Neg Hx   . Crohn's disease Neg Hx   . Pancreatic cancer Neg Hx   . Rectal cancer Neg Hx   . Stomach cancer Neg Hx   . Ulcerative colitis Neg Hx   . Breast cancer Neg Hx    Allergies  Allergen Reactions  . Ace Inhibitors Cough  . Ambien [Zolpidem Tartrate] Other (See Comments)    Affected memory   Current Outpatient Medications on File Prior to Visit  Medication Sig Dispense Refill  . Calcium Carbonate-Vitamin D (CALCIUM-VITAMIN D) 500-200 MG-UNIT per tablet Take 1 tablet by mouth daily.      Marland Kitchen docusate sodium (COLACE)  100 MG capsule Take 1 capsule (100 mg total) by mouth 2 (two) times daily. 10 capsule 0  . guaiFENesin-codeine (CHERATUSSIN AC) 100-10 MG/5ML syrup Take 5 mLs by mouth 2 (two) times daily as needed for cough (sedation precautions). 120 mL 0  . Multiple Vitamins-Minerals (MULTIVITAMIN PO) Take 1 tablet by mouth daily.    . ranitidine (ZANTAC) 150 MG tablet Take 1 tablet (150 mg total) by mouth 2 (two) times daily. 60 tablet 5  . Tetrahydrozoline HCl (VISINE  OP) Place 1 drop into both eyes as needed (for dry eyes).     No current facility-administered medications on file prior to visit.     Review of Systems  Constitutional: Positive for fatigue. Negative for activity change, appetite change, fever and unexpected weight change.  HENT: Negative for congestion, ear pain, rhinorrhea, sinus pressure and sore throat.   Eyes: Negative for pain, redness and visual disturbance.  Respiratory: Negative for cough, shortness of breath and wheezing.   Cardiovascular: Negative for chest pain and palpitations.  Gastrointestinal: Negative for abdominal pain, blood in stool, constipation and diarrhea.  Endocrine: Positive for polydipsia. Negative for polyuria.       Thirst  Genitourinary: Negative for dysuria, frequency and urgency.  Musculoskeletal: Negative for arthralgias, back pain and myalgias.  Skin: Negative for pallor and rash.  Allergic/Immunologic: Negative for environmental allergies.  Neurological: Negative for dizziness, syncope and headaches.  Hematological: Negative for adenopathy. Does not bruise/bleed easily.  Psychiatric/Behavioral: Positive for sleep disturbance. Negative for decreased concentration and dysphoric mood. The patient is not nervous/anxious.        Objective:   Physical Exam  Constitutional: She appears well-developed and well-nourished. No distress.  obese and well appearing   HENT:  Head: Normocephalic and atraumatic.  Right Ear: External ear normal.  Left Ear:  External ear normal.  Mouth/Throat: Oropharynx is clear and moist.  Eyes: Pupils are equal, round, and reactive to light. Conjunctivae and EOM are normal. Right eye exhibits no discharge. Left eye exhibits no discharge. No scleral icterus.  Neck: Normal range of motion. Neck supple. No JVD present. Carotid bruit is not present. No thyromegaly present.  Cardiovascular: Normal rate, regular rhythm and intact distal pulses. Exam reveals gallop.  Pulmonary/Chest: Effort normal and breath sounds normal. No respiratory distress. She has no wheezes. She has no rales. She exhibits no tenderness. No breast tenderness, discharge or bleeding.  Abdominal: Soft. Bowel sounds are normal. She exhibits no distension, no abdominal bruit and no mass. There is no tenderness.  Genitourinary: No breast tenderness, discharge or bleeding.  Genitourinary Comments: Breast exam: No mass, nodules, thickening, tenderness, bulging, retraction, inflamation, nipple discharge or skin changes noted.  No axillary or clavicular LA.      Musculoskeletal: Normal range of motion. She exhibits no edema or tenderness.  No kyphosis   Lymphadenopathy:    She has no cervical adenopathy.  Neurological: She is alert. She has normal reflexes. She displays normal reflexes. No cranial nerve deficit. She exhibits normal muscle tone. Coordination normal.  Skin: Skin is warm and dry. No rash noted. No erythema. No pallor.  Solar lentigines diffusely  Some sks  Psychiatric: She has a normal mood and affect.  Pleasant  Openly discusses stressors           Assessment & Plan:   Problem List Items Addressed This Visit      Cardiovascular and Mediastinum   Essential hypertension    bp in fair control at this time  BP Readings from Last 1 Encounters:  12/24/17 134/70   No changes needed Most recent labs reviewed  Disc lifstyle change with low sodium diet and exercise        Relevant Medications   hydrochlorothiazide (MICROZIDE)  12.5 MG capsule   pravastatin (PRAVACHOL) 80 MG tablet     Endocrine   Controlled type 2 diabetes mellitus without complication, without long-term current use of insulin ([place])    New diagnosis Lab Results  Component Value Date   HGBA1C  7.7 (H) 11/29/2017   Pt was laid up with hip /back injury for months and did not eat healthy  Disc diet for DM- will begin to work on that Also ref to DM education at [facility]  Already on statin  Will discuss foot/eye care at f/u along with renal protection  Plan made for wt loss- she can be more active now Start metformin xr 500 1 daily - alert [place] if side effects  F/u 3 mo       Relevant Medications   pravastatin (PRAVACHOL) 80 MG tablet   metFORMIN (GLUCOPHAGE-XR) 500 MG 24 hr tablet   Other Relevant Orders   Ambulatory referral to diabetic education     Musculoskeletal and Integument   Disorder of bone and cartilage    Osteopenia on dexa 1/18 in FN  Taking ca and D Enc exercise  Will re check in 2020        Other   Routine general medical examination at a health care facility - Primary    Reviewed health habits including diet and exercise and skin cancer prevention Reviewed appropriate screening tests for age  Also reviewed health mt list, fam hx and immunization status , as well as social and family history   See HPI Labs reviewed  Disc imp of ca and D for bone health  Disc imp of wt loss for new DM2       Obesity (BMI 30-39.9)    Discussed how this problem influences overall health and the risks it imposes  Reviewed plan for weight loss with lower calorie diet (via better food choices and also portion control or program like weight watchers) and exercise building up to or more than 30 minutes 5 days per week including some aerobic activity         Relevant Medications   metFORMIN (GLUCOPHAGE-XR) 500 MG 24 hr tablet   Insomnia    Continues trazodone which is helpful      Hyperlipidemia    Disc goals for lipids and reasons to  control them Rev last labs with pt Rev low sat fat diet in detail LDL is well controlled with pravastatin  HDL down-enc her to keep exercising        Relevant Medications   hydrochlorothiazide (MICROZIDE) 12.5 MG capsule   pravastatin (PRAVACHOL) 80 MG tablet   Depression with anxiety    Struggling more lately with stressors Wishes to inc fluoxetine dose to 40 mg  Disc poss side eff/ update Reviewed stressors/ coping techniques/symptoms/ support sources/ tx options and side effects in detail today Self care encouraged  Enc to keep exercising       Relevant Medications   FLUoxetine (PROZAC) 40 MG capsule   traZODone (DESYREL) 50 MG tablet

## 2017-12-24 NOTE — Assessment & Plan Note (Signed)
Osteopenia on dexa 1/18 in FN  Taking ca and D Enc exercise  Will re check in 2020

## 2017-12-24 NOTE — Assessment & Plan Note (Signed)
Reviewed health habits including diet and exercise and skin cancer prevention Reviewed appropriate screening tests for age  Also reviewed health mt list, fam hx and immunization status , as well as social and family history   See HPI Labs reviewed  Disc imp of ca and D for bone health  Disc imp of wt loss for new DM2

## 2018-01-08 ENCOUNTER — Encounter: Payer: Self-pay | Admitting: Family Medicine

## 2018-01-08 ENCOUNTER — Ambulatory Visit (INDEPENDENT_AMBULATORY_CARE_PROVIDER_SITE_OTHER): Payer: Medicare HMO | Admitting: Family Medicine

## 2018-01-08 VITALS — BP 134/80 | HR 77 | Temp 98.2°F | Ht 64.5 in | Wt 172.0 lb

## 2018-01-08 DIAGNOSIS — J019 Acute sinusitis, unspecified: Secondary | ICD-10-CM | POA: Insufficient documentation

## 2018-01-08 DIAGNOSIS — J0110 Acute frontal sinusitis, unspecified: Secondary | ICD-10-CM | POA: Diagnosis not present

## 2018-01-08 MED ORDER — BENZONATATE 200 MG PO CAPS: 200.0000 mg | ORAL_CAPSULE | Freq: Three times a day (TID) | ORAL | 1 refills | Status: DC | PRN

## 2018-01-08 MED ORDER — AMOXICILLIN-POT CLAVULANATE 875-125 MG PO TABS: 1.0000 | ORAL_TABLET | Freq: Two times a day (BID) | ORAL | 0 refills | Status: DC

## 2018-01-08 MED ORDER — PROMETHAZINE-DM 6.25-15 MG/5ML PO SYRP: 5.0000 mL | ORAL_SOLUTION | Freq: Every evening | ORAL | 0 refills | Status: DC | PRN

## 2018-01-08 NOTE — Assessment & Plan Note (Signed)
2 weeks of symptoms  Cover with augmentin  Fluids/rest Tessalon and prometh DM for cough  Disc symptomatic care - see instructions on AVS  Update if not starting to improve in a week or if worsening

## 2018-01-08 NOTE — Patient Instructions (Signed)
Drink lots of fluids Rest   Ibuprofen or tylenol for sinus pain   Take the augmentin for sinus infection  Tessalon and prometh DM for cough  Caution of sedation   Nasal saline spray may help congestion   Update if not starting to improve in a week or if worsening

## 2018-01-08 NOTE — Progress Notes (Signed)
Subjective:    Patient ID: Connie Rowe, female    DOB: 10-Apr-1950, 67 y.o.   MRN: [id number]  HPI Here for uri symptoms/cough   Pulse ox is 95% She has had a flu vaccine   Started as a cold 2 weeks ago - got bad quickly  Not getting better   Cough - terrible Lucia Bitter her sore Dry cough- not productive Nasal congestion- on and off  Some sinus pain - above her eyes  Nasal d/c - green   No fever- but gets hot at night  No chills or body aches  Very tired   Ears-itch  Throat- itches also   Otc: day quil and nyquil  Lozenges   Patient Active Problem List   Diagnosis Date Noted  . Acute sinusitis 01/08/2018  . Allergic rhinitis 09/03/2017  . Obesity (BMI 30-39.9) 01/10/2017  . Insomnia 11/27/2016  . Daytime somnolence 05/23/2016  . Snoring 05/23/2016  . Estrogen deficiency 10/19/2015  . Need for hepatitis C screening test 09/27/2015  . S/P right THA, AA 03/30/2015  . Pre-operative examination 02/24/2015  . S/P lumbar laminectomy 09/23/2014  . Essential hypertension 06/26/2014  . Encounter for Medicare annual wellness exam 06/03/2014  . Colon cancer screening 06/03/2014  . Sciatica 11/26/2013  . Controlled type 2 diabetes mellitus without complication, without long-term current use of insulin ([place]) 04/17/2013  . Hirsutism 12/06/2010  . Routine general medical examination at a health care facility 09/10/2010  . Other screening mammogram 09/01/2010  . Disorder of bone and cartilage 10/22/2006  . URINARY INCONTINENCE, STRESS 08/22/2006  . History of Helicobacter pylori infection 04/18/2006  . Hyperlipidemia 04/18/2006  . Depression with anxiety 04/18/2006  . EXTERNAL HEMORRHOIDS 04/18/2006  . GERD 04/18/2006  . HIATAL HERNIA 04/18/2006  . OVERACTIVE BLADDER 04/18/2006  . HEMATURIA, MICROSCOPIC 04/18/2006  . PANCREATITIS, HX OF 04/18/2006  . DIVERTICULOSIS, COLON 01/02/2000   Past Medical History:  Diagnosis Date  . Arthritis   . Colon polyps   .  Depression   . Esophageal stricture   . Falls   . GERD (gastroesophageal reflux disease)   . H/O fracture of humerus 2006  . History of bronchitis   . History of hiatal hernia   . Hyperlipidemia   . Hypertension   . Nocturia   . Osteopenia   . Pancreatitis    History of   Past Surgical History:  Procedure Laterality Date  . BLADDER REPAIR    . COLONOSCOPY    . DIAGNOSTIC LAPAROSCOPY     tubal ligation  . DILATION AND CURETTAGE OF UTERUS    . ESOPHAGEAL DILATION    . LUMBAR LAMINECTOMY/DECOMPRESSION MICRODISCECTOMY Right 09/23/2014   Procedure: Laminectomy and Foraminotomy - Lumbar three- lumbar four - right ;  Surgeon: Eustace Moore, MD;  Location: [facility];  Service: Neurosurgery;  Laterality: Right;  . MULTIPLE TOOTH EXTRACTIONS    . TONSILLECTOMY    . TOTAL HIP ARTHROPLASTY Right 03/30/2015   Procedure: RIGHT TOTAL HIP ARTHROPLASTY ANTERIOR APPROACH;  Surgeon: Paralee Cancel, MD;  Location: [facility];  Service: Orthopedics;  Laterality: Right;  . TOTAL HIP ARTHROPLASTY Left 01/09/2017   Procedure: LEFT TOTAL HIP ARTHROPLASTY ANTERIOR APPROACH;  Surgeon: Paralee Cancel, MD;  Location: [facility];  Service: Orthopedics;  Laterality: Left;  70 mins  . TUBAL LIGATION    . UPPER GI ENDOSCOPY     Social History   Tobacco Use  . Smoking status: Never Smoker  . Smokeless tobacco: Never Used  Substance  Use Topics  . Alcohol use: No    Alcohol/week: 0.0 standard drinks  . Drug use: No   Family History  Problem Relation Age of Onset  . Stroke Mother   . Hypertension Mother   . Cancer Mother        colon CA  . Colon cancer Mother   . Drug abuse Son   . Stroke Brother   . Colon polyps Neg Hx   . Crohn's disease Neg Hx   . Pancreatic cancer Neg Hx   . Rectal cancer Neg Hx   . Stomach cancer Neg Hx   . Ulcerative colitis Neg Hx   . Breast cancer Neg Hx    Allergies  Allergen Reactions  . Ace Inhibitors Cough  . Ambien [Zolpidem Tartrate] Other (See Comments)    Affected  memory   Current Outpatient Medications on File Prior to Visit  Medication Sig Dispense Refill  . Calcium Carbonate-Vitamin D (CALCIUM-VITAMIN D) 500-200 MG-UNIT per tablet Take 1 tablet by mouth daily.      Marland Kitchen docusate sodium (COLACE) 100 MG capsule Take 1 capsule (100 mg total) by mouth 2 (two) times daily. 10 capsule 0  . FLUoxetine (PROZAC) 40 MG capsule Take 1 capsule (40 mg total) by mouth daily. 90 capsule 3  . fluticasone (FLONASE) 50 MCG/ACT nasal spray Place 2 sprays into both nostrils daily. 48 g 3  . hydrochlorothiazide (MICROZIDE) 12.5 MG capsule Take 1 capsule (12.5 mg total) by mouth daily. 90 capsule 3  . metFORMIN (GLUCOPHAGE-XR) 500 MG 24 hr tablet Take 1 tablet (500 mg total) by mouth daily with breakfast. 30 tablet 11  . Multiple Vitamins-Minerals (MULTIVITAMIN PO) Take 1 tablet by mouth daily.    . pantoprazole (PROTONIX) 40 MG tablet Take 1 tablet (40 mg total) by mouth 2 (two) times daily. 180 tablet 3  . pravastatin (PRAVACHOL) 80 MG tablet Take 1 tablet (80 mg total) by mouth daily. 90 tablet 3  . ranitidine (ZANTAC) 150 MG tablet Take 1 tablet (150 mg total) by mouth 2 (two) times daily. 60 tablet 5  . Tetrahydrozoline HCl (VISINE OP) Place 1 drop into both eyes as needed (for dry eyes).    . traZODone (DESYREL) 50 MG tablet Take 0.5-1 tablets (25-50 mg total) by mouth at bedtime as needed for sleep. 90 tablet 3  . guaiFENesin-codeine (CHERATUSSIN AC) 100-10 MG/5ML syrup Take 5 mLs by mouth 2 (two) times daily as needed for cough (sedation precautions). (Patient not taking: Reported on 01/08/2018) 120 mL 0   No current facility-administered medications on file prior to visit.     Review of Systems  Constitutional: Positive for appetite change. Negative for fatigue and fever.  HENT: Positive for congestion, ear pain, postnasal drip, rhinorrhea, sinus pressure, sinus pain and sore throat. Negative for nosebleeds and voice change.   Eyes: Negative for pain, redness and  itching.  Respiratory: Positive for cough. Negative for chest tightness, shortness of breath and wheezing.   Cardiovascular: Negative for chest pain.  Gastrointestinal: Negative for abdominal pain, diarrhea, nausea and vomiting.  Endocrine: Negative for polyuria.  Genitourinary: Negative for dysuria, frequency and urgency.  Musculoskeletal: Negative for arthralgias and myalgias.  Allergic/Immunologic: Negative for immunocompromised state.  Neurological: Positive for headaches. Negative for dizziness, tremors, syncope, weakness and numbness.  Hematological: Negative for adenopathy. Does not bruise/bleed easily.  Psychiatric/Behavioral: Negative for dysphoric mood. The patient is not nervous/anxious.        Objective:   Physical Exam  Constitutional: She  appears well-developed and well-nourished. No distress.  overwt and well appearing   HENT:  Head: Normocephalic and atraumatic.  Right Ear: External ear normal.  Left Ear: External ear normal.  Mouth/Throat: Oropharynx is clear and moist. No oropharyngeal exudate.  Nares are injected and congested  Bilateral frontal sinus tenderness  Post nasal drip   Eyes: Pupils are equal, round, and reactive to light. Conjunctivae and EOM are normal. Right eye exhibits no discharge. Left eye exhibits no discharge.  Neck: Normal range of motion. Neck supple.  Cardiovascular: Normal rate and regular rhythm.  Pulmonary/Chest: Effort normal and breath sounds normal. No stridor. No respiratory distress. She has no wheezes. She has no rales.  Good air exch Persistent dry cough No rales/rhonchi  No wheeze even on forced exp  Lymphadenopathy:    She has no cervical adenopathy.  Neurological: She is alert. No cranial nerve deficit.  Skin: Skin is warm and dry. No rash noted.  Psychiatric: She has a normal mood and affect.          Assessment & Plan:   Problem List Items Addressed This Visit      Respiratory   Acute sinusitis - Primary    2  weeks of symptoms  Cover with augmentin  Fluids/rest Tessalon and prometh DM for cough  Disc symptomatic care - see instructions on AVS  Update if not starting to improve in a week or if worsening        Relevant Medications   amoxicillin-clavulanate (AUGMENTIN) 875-125 MG tablet   benzonatate (TESSALON) 200 MG capsule   promethazine-dextromethorphan (PROMETHAZINE-DM) 6.25-15 MG/5ML syrup

## 2018-01-15 ENCOUNTER — Ambulatory Visit: Payer: Medicare HMO | Admitting: Dietician

## 2018-01-28 ENCOUNTER — Ambulatory Visit (INDEPENDENT_AMBULATORY_CARE_PROVIDER_SITE_OTHER): Payer: Medicare HMO | Admitting: Internal Medicine

## 2018-01-28 ENCOUNTER — Encounter: Payer: Self-pay | Admitting: Internal Medicine

## 2018-01-28 VITALS — BP 128/82 | HR 76 | Temp 98.1°F | Wt 176.0 lb

## 2018-01-28 DIAGNOSIS — R0982 Postnasal drip: Secondary | ICD-10-CM

## 2018-01-28 DIAGNOSIS — J329 Chronic sinusitis, unspecified: Secondary | ICD-10-CM | POA: Diagnosis not present

## 2018-01-28 DIAGNOSIS — B9789 Other viral agents as the cause of diseases classified elsewhere: Secondary | ICD-10-CM | POA: Diagnosis not present

## 2018-01-28 DIAGNOSIS — R05 Cough: Secondary | ICD-10-CM

## 2018-01-28 DIAGNOSIS — R059 Cough, unspecified: Secondary | ICD-10-CM

## 2018-01-28 MED ORDER — PROMETHAZINE-DM 6.25-15 MG/5ML PO SYRP: 5.0000 mL | ORAL_SOLUTION | Freq: Four times a day (QID) | ORAL | 0 refills | Status: DC | PRN

## 2018-01-28 MED ORDER — BENZONATATE 200 MG PO CAPS: 200.0000 mg | ORAL_CAPSULE | Freq: Three times a day (TID) | ORAL | 0 refills | Status: DC | PRN

## 2018-01-28 NOTE — Patient Instructions (Signed)

## 2018-01-28 NOTE — Progress Notes (Signed)
Subjective:    Patient ID: Connie Rowe, female    DOB: 09/21/1950, 67 y.o.   MRN: [id number]  HPI  Pt presents to the clinic today with c/o headaches and cough. This started about 1 month ago. The headache is located in her forehead. She describes the pain as pressure. She denies visual changes. The cough is non productive. She denies shortness of breath. She denies runny nose, nasal congestion, ear pain, sore throat. She denies recent fever, chills or body aches. She has not taken anything OTC for this. She was treated for a sinus infection 12/10 with Augmentin, Tessalon and Promethazine DM. She reports most of her symptoms improved but the headache and cough persists.  Review of Systems      Past Medical History:  Diagnosis Date  . Arthritis   . Colon polyps   . Depression   . Esophageal stricture   . Falls   . GERD (gastroesophageal reflux disease)   . H/O fracture of humerus 2006  . History of bronchitis   . History of hiatal hernia   . Hyperlipidemia   . Hypertension   . Nocturia   . Osteopenia   . Pancreatitis    History of    Current Outpatient Medications  Medication Sig Dispense Refill  . Calcium Carbonate-Vitamin D (CALCIUM-VITAMIN D) 500-200 MG-UNIT per tablet Take 1 tablet by mouth daily.      Marland Kitchen docusate sodium (COLACE) 100 MG capsule Take 1 capsule (100 mg total) by mouth 2 (two) times daily. 10 capsule 0  . FLUoxetine (PROZAC) 40 MG capsule Take 1 capsule (40 mg total) by mouth daily. 90 capsule 3  . fluticasone (FLONASE) 50 MCG/ACT nasal spray Place 2 sprays into both nostrils daily. 48 g 3  . hydrochlorothiazide (MICROZIDE) 12.5 MG capsule Take 1 capsule (12.5 mg total) by mouth daily. 90 capsule 3  . metFORMIN (GLUCOPHAGE-XR) 500 MG 24 hr tablet Take 1 tablet (500 mg total) by mouth daily with breakfast. 30 tablet 11  . Multiple Vitamins-Minerals (MULTIVITAMIN PO) Take 1 tablet by mouth daily.    . pantoprazole (PROTONIX) 40 MG tablet Take 1 tablet (40  mg total) by mouth 2 (two) times daily. 180 tablet 3  . pravastatin (PRAVACHOL) 80 MG tablet Take 1 tablet (80 mg total) by mouth daily. 90 tablet 3  . ranitidine (ZANTAC) 150 MG tablet Take 1 tablet (150 mg total) by mouth 2 (two) times daily. 60 tablet 5  . Tetrahydrozoline HCl (VISINE OP) Place 1 drop into both eyes as needed (for dry eyes).    . traZODone (DESYREL) 50 MG tablet Take 0.5-1 tablets (25-50 mg total) by mouth at bedtime as needed for sleep. 90 tablet 3   No current facility-administered medications for this visit.     Allergies  Allergen Reactions  . Ace Inhibitors Cough  . Ambien [Zolpidem Tartrate] Other (See Comments)    Affected memory    Family History  Problem Relation Age of Onset  . Stroke Mother   . Hypertension Mother   . Cancer Mother        colon CA  . Colon cancer Mother   . Drug abuse Son   . Stroke Brother   . Colon polyps Neg Hx   . Crohn's disease Neg Hx   . Pancreatic cancer Neg Hx   . Rectal cancer Neg Hx   . Stomach cancer Neg Hx   . Ulcerative colitis Neg Hx   . Breast cancer Neg Hx  Social History   Socioeconomic History  . Marital status: Married    Spouse name: Not on file  . Number of children: Not on file  . Years of education: Not on file  . Highest education level: Not on file  Occupational History  . Not on file  Social Needs  . Financial resource strain: Not on file  . Food insecurity:    Worry: Not on file    Inability: Not on file  . Transportation needs:    Medical: Not on file    Non-medical: Not on file  Tobacco Use  . Smoking status: Never Smoker  . Smokeless tobacco: Never Used  Substance and Sexual Activity  . Alcohol use: No    Alcohol/week: 0.0 standard drinks  . Drug use: No  . Sexual activity: Yes  Lifestyle  . Physical activity:    Days per week: Not on file    Minutes per session: Not on file  . Stress: Not on file  Relationships  . Social connections:    Talks on phone: Not on file     Gets together: Not on file    Attends religious service: Not on file    Active member of club or organization: Not on file    Attends meetings of clubs or organizations: Not on file    Relationship status: Not on file  . Intimate partner violence:    Fear of current or ex partner: Not on file    Emotionally abused: Not on file    Physically abused: Not on file    Forced sexual activity: Not on file  Other Topics Concern  . Not on file  Social History Narrative  . Not on file     Constitutional: Pt reports headache. Denies fever, malaise, fatigue, or abrupt weight changes.  HEENT: Denies eye pain, eye redness, ear pain, ringing in the ears, wax buildup, runny nose, nasal congestion, bloody nose, or sore throat. Respiratory: Pt reports cough. Denies difficulty breathing, shortness of breath, or sputum production.   Cardiovascular: Denies chest pain, chest tightness, palpitations or swelling in the hands or feet.   No other specific complaints in a complete review of systems (except as listed in HPI above).  Objective:   Physical Exam  BP 128/82   Pulse 76   Temp 98.1 F (36.7 C) (Oral)   Wt 176 lb (79.8 kg)   SpO2 97%   BMI 29.74 kg/m  Wt Readings from Last 3 Encounters:  01/28/18 176 lb (79.8 kg)  01/08/18 172 lb (78 kg)  12/24/17 180 lb 12 oz (82 kg)    General: Appears her stated age, well developed, well nourished in NAD. HEENT: Head: normal shape and size, frontal sinus tenderness noted; Nose: mucosa pink and moist, septum midline; Throat/Mouth: Teeth present, mucosa pink and moist, + PND, no exudate, lesions or ulcerations noted.  Neck:  No adenopathy noted. Cardiovascular: Normal rate and rhythm. S1,S2 noted.  No murmur, rubs or gallops noted.  Pulmonary/Chest: Normal effort and positive vesicular breath sounds. No respiratory distress. No wheezes, rales or ronchi noted.    BMET    Component Value Date/Time   NA 137 11/29/2017 1108   K 4.3 11/29/2017 1108    CL 100 11/29/2017 1108   CO2 32 11/29/2017 1108   GLUCOSE 141 (H) 11/29/2017 1108   BUN 15 11/29/2017 1108   CREATININE 1.12 11/29/2017 1108   CALCIUM 9.5 11/29/2017 1108   GFRNONAA >60 01/10/2017 0559   GFRAA >60  01/10/2017 0559    Lipid Panel     Component Value Date/Time   CHOL 177 11/29/2017 1108   TRIG 189.0 (H) 11/29/2017 1108   HDL 53.00 11/29/2017 1108   CHOLHDL 3 11/29/2017 1108   VLDL 37.8 11/29/2017 1108   LDLCALC 86 11/29/2017 1108    CBC    Component Value Date/Time   WBC 5.5 11/29/2017 1108   RBC 4.59 11/29/2017 1108   HGB 13.5 11/29/2017 1108   HCT 40.5 11/29/2017 1108   PLT 238.0 11/29/2017 1108   MCV 88.3 11/29/2017 1108   MCH 29.6 01/10/2017 0559   MCHC 33.4 11/29/2017 1108   RDW 14.7 11/29/2017 1108   LYMPHSABS 2.4 11/29/2017 1108   MONOABS 0.4 11/29/2017 1108   EOSABS 0.1 11/29/2017 1108   BASOSABS 0.0 11/29/2017 1108    Hgb A1C Lab Results  Component Value Date   HGBA1C 7.7 (H) 11/29/2017             Assessment & Plan:   Viral Sinusitis, PND, Cough:  No indication for additional abx at this time Start Zyrtec OTC Continue Flonase Refilled Tessalon and Promethazine DM  Return precautions discussed Webb Silversmith, NP

## 2018-01-31 ENCOUNTER — Telehealth: Payer: Self-pay | Admitting: Family Medicine

## 2018-01-31 MED ORDER — FLUOXETINE HCL 40 MG PO CAPS: 40.0000 mg | ORAL_CAPSULE | Freq: Every day | ORAL | 0 refills | Status: DC

## 2018-01-31 NOTE — Telephone Encounter (Signed)
Rx sent to [facility]

## 2018-01-31 NOTE — Telephone Encounter (Signed)
Patient was given a new [facility] for Fluoxetine at [facility].  Patient said dosage was increased to 40 mg .  Patient doubled up on her remaining pills, but forgot to mail in rx.  Patient said she has 4 days worth of pills left. Patient would like a one month rx called in to [facility] and she'll mail the rx given to her to [facility].

## 2018-02-04 ENCOUNTER — Ambulatory Visit
Admission: RE | Admit: 2018-02-04 | Discharge: 2018-02-04 | Disposition: A | Discharge status: Home / Self Care | Payer: Medicare HMO | Source: Ambulatory Visit | Attending: Family Medicine | Admitting: Family Medicine

## 2018-02-04 DIAGNOSIS — Z1231 Encounter for screening mammogram for malignant neoplasm of breast: Secondary | ICD-10-CM | POA: Diagnosis not present

## 2018-02-25 DIAGNOSIS — E139 Other specified diabetes mellitus without complications: Secondary | ICD-10-CM | POA: Diagnosis not present

## 2018-02-25 DIAGNOSIS — H524 Presbyopia: Secondary | ICD-10-CM | POA: Diagnosis not present

## 2018-02-25 DIAGNOSIS — I10 Essential (primary) hypertension: Secondary | ICD-10-CM | POA: Diagnosis not present

## 2018-02-25 LAB — HM DIABETES EYE EXAM

## 2018-03-26 ENCOUNTER — Ambulatory Visit (INDEPENDENT_AMBULATORY_CARE_PROVIDER_SITE_OTHER): Payer: Medicare HMO | Admitting: Family Medicine

## 2018-03-26 ENCOUNTER — Encounter: Payer: Self-pay | Admitting: Family Medicine

## 2018-03-26 VITALS — BP 124/82 | HR 69 | Temp 98.1°F | Ht 64.5 in | Wt 169.1 lb

## 2018-03-26 DIAGNOSIS — E119 Type 2 diabetes mellitus without complications: Secondary | ICD-10-CM | POA: Diagnosis not present

## 2018-03-26 DIAGNOSIS — I10 Essential (primary) hypertension: Secondary | ICD-10-CM

## 2018-03-26 LAB — POCT GLYCOSYLATED HEMOGLOBIN (HGB A1C): Hemoglobin A1C: 6.5 % — AB (ref 4.0–5.6)

## 2018-03-26 LAB — MICROALBUMIN / CREATININE URINE RATIO
Creatinine,U: 196.6 mg/dL
Microalb Creat Ratio: 0.5 mg/g (ref 0.0–30.0)
Microalb, Ur: 1 mg/dL (ref 0.0–1.9)

## 2018-03-26 NOTE — Progress Notes (Signed)
Subjective:    Patient ID: Connie Rowe, female    DOB: July 24, 1950, 68 y.o.   MRN: [id number]  HPI Here for f/u of chronic health problems   Wt Readings from Last 3 Encounters:  03/26/18 169 lb 1 oz (76.7 kg)  01/28/18 176 lb (79.8 kg)  01/08/18 172 lb (78 kg)  weight is down  28.57 kg/m   She has been doing a lot of housework for exercise/physical activity  Not eating as much  Also occ has slim fast for diabetic instead of a meal  Watching diet as well  Avoids sweets entirely  bp is stable today  No cp or palpitations or headaches or edema  No side effects to medicines  BP Readings from Last 3 Encounters:  03/26/18 124/82  01/28/18 128/82  01/08/18 134/80    Takes hctz   Diabetes Home sugar results  DM diet - much better  Exercise- walking and housework at least 30 minutes a day   Symptoms A1C last  Lab Results  Component Value Date   HGBA1C 7.7 (H) 11/29/2017  had been laid up with back injury last time  We ref to DM ed at [facility] (she forgot her appt)  Started low dose of metformin xr 500 mg one pill daily -no trouble with it  Lab Results  Component Value Date   HGBA1C 6.5 (A) 03/26/2018  much better    No problems with medications  Renal protection-cannot take ace Last eye exam  -just had one a month ago / no retinopathy   Taking statin  Patient Active Problem List   Diagnosis Date Noted  . Allergic rhinitis 09/03/2017  . Obesity (BMI 30-39.9) 01/10/2017  . Insomnia 11/27/2016  . Daytime somnolence 05/23/2016  . Snoring 05/23/2016  . Estrogen deficiency 10/19/2015  . Need for hepatitis C screening test 09/27/2015  . S/P right THA, AA 03/30/2015  . Pre-operative examination 02/24/2015  . S/P lumbar laminectomy 09/23/2014  . Essential hypertension 06/26/2014  . Encounter for Medicare annual wellness exam 06/03/2014  . Colon cancer screening 06/03/2014  . Sciatica 11/26/2013  . Controlled type 2 diabetes mellitus without complication, without  long-term current use of insulin ([place]) 04/17/2013  . Hirsutism 12/06/2010  . Routine general medical examination at a health care facility 09/10/2010  . Other screening mammogram 09/01/2010  . Disorder of bone and cartilage 10/22/2006  . URINARY INCONTINENCE, STRESS 08/22/2006  . History of Helicobacter pylori infection 04/18/2006  . Hyperlipidemia 04/18/2006  . Depression with anxiety 04/18/2006  . EXTERNAL HEMORRHOIDS 04/18/2006  . GERD 04/18/2006  . HIATAL HERNIA 04/18/2006  . OVERACTIVE BLADDER 04/18/2006  . HEMATURIA, MICROSCOPIC 04/18/2006  . PANCREATITIS, HX OF 04/18/2006  . DIVERTICULOSIS, COLON 01/02/2000   Past Medical History:  Diagnosis Date  . Arthritis   . Colon polyps   . Depression   . Esophageal stricture   . Falls   . GERD (gastroesophageal reflux disease)   . H/O fracture of humerus 2006  . History of bronchitis   . History of hiatal hernia   . Hyperlipidemia   . Hypertension   . Nocturia   . Osteopenia   . Pancreatitis    History of   Past Surgical History:  Procedure Laterality Date  . BLADDER REPAIR    . COLONOSCOPY    . DIAGNOSTIC LAPAROSCOPY     tubal ligation  . DILATION AND CURETTAGE OF UTERUS    . ESOPHAGEAL DILATION    . LUMBAR LAMINECTOMY/DECOMPRESSION MICRODISCECTOMY Right 09/23/2014  Procedure: Laminectomy and Foraminotomy - Lumbar three- lumbar four - right ;  Surgeon: Eustace Moore, MD;  Location: [facility];  Service: Neurosurgery;  Laterality: Right;  . MULTIPLE TOOTH EXTRACTIONS    . TONSILLECTOMY    . TOTAL HIP ARTHROPLASTY Right 03/30/2015   Procedure: RIGHT TOTAL HIP ARTHROPLASTY ANTERIOR APPROACH;  Surgeon: Paralee Cancel, MD;  Location: [facility];  Service: Orthopedics;  Laterality: Right;  . TOTAL HIP ARTHROPLASTY Left 01/09/2017   Procedure: LEFT TOTAL HIP ARTHROPLASTY ANTERIOR APPROACH;  Surgeon: Paralee Cancel, MD;  Location: [facility];  Service: Orthopedics;  Laterality: Left;  70 mins  . TUBAL LIGATION    . UPPER GI ENDOSCOPY      Social History   Tobacco Use  . Smoking status: Never Smoker  . Smokeless tobacco: Never Used  Substance Use Topics  . Alcohol use: No    Alcohol/week: 0.0 standard drinks  . Drug use: No   Family History  Problem Relation Age of Onset  . Stroke Mother   . Hypertension Mother   . Cancer Mother        colon CA  . Colon cancer Mother   . Drug abuse Son   . Stroke Brother   . Colon polyps Neg Hx   . Crohn's disease Neg Hx   . Pancreatic cancer Neg Hx   . Rectal cancer Neg Hx   . Stomach cancer Neg Hx   . Ulcerative colitis Neg Hx   . Breast cancer Neg Hx    Allergies  Allergen Reactions  . Ace Inhibitors Cough  . Ambien [Zolpidem Tartrate] Other (See Comments)    Affected memory   Current Outpatient Medications on File Prior to Visit  Medication Sig Dispense Refill  . Calcium Carbonate-Vitamin D (CALCIUM-VITAMIN D) 500-200 MG-UNIT per tablet Take 1 tablet by mouth daily.      Marland Kitchen docusate sodium (COLACE) 100 MG capsule Take 1 capsule (100 mg total) by mouth 2 (two) times daily. 10 capsule 0  . FLUoxetine (PROZAC) 40 MG capsule Take 1 capsule (40 mg total) by mouth daily. 90 capsule 3  . FLUoxetine (PROZAC) 40 MG capsule Take 1 capsule (40 mg total) by mouth daily. 30 capsule 0  . fluticasone (FLONASE) 50 MCG/ACT nasal spray Place 2 sprays into both nostrils daily. 48 g 3  . hydrochlorothiazide (MICROZIDE) 12.5 MG capsule Take 1 capsule (12.5 mg total) by mouth daily. 90 capsule 3  . metFORMIN (GLUCOPHAGE-XR) 500 MG 24 hr tablet Take 1 tablet (500 mg total) by mouth daily with breakfast. 30 tablet 11  . Multiple Vitamins-Minerals (MULTIVITAMIN PO) Take 1 tablet by mouth daily.    . pantoprazole (PROTONIX) 40 MG tablet Take 1 tablet (40 mg total) by mouth 2 (two) times daily. 180 tablet 3  . pravastatin (PRAVACHOL) 80 MG tablet Take 1 tablet (80 mg total) by mouth daily. 90 tablet 3  . ranitidine (ZANTAC) 150 MG tablet Take 1 tablet (150 mg total) by mouth 2 (two) times  daily. 60 tablet 5  . Tetrahydrozoline HCl (VISINE OP) Place 1 drop into both eyes as needed (for dry eyes).    . traZODone (DESYREL) 50 MG tablet Take 0.5-1 tablets (25-50 mg total) by mouth at bedtime as needed for sleep. 90 tablet 3   No current facility-administered medications on file prior to visit.     Review of Systems  Constitutional: Negative for activity change, appetite change, fatigue, fever and unexpected weight change.  HENT: Negative for congestion, ear  pain, rhinorrhea, sinus pressure and sore throat.   Eyes: Negative for pain, redness and visual disturbance.  Respiratory: Negative for cough, shortness of breath and wheezing.   Cardiovascular: Negative for chest pain and palpitations.  Gastrointestinal: Negative for abdominal pain, blood in stool, constipation and diarrhea.  Endocrine: Negative for polydipsia and polyuria.  Genitourinary: Negative for dysuria, frequency and urgency.  Musculoskeletal: Positive for back pain. Negative for arthralgias and myalgias.  Skin: Negative for pallor and rash.  Allergic/Immunologic: Negative for environmental allergies.  Neurological: Negative for dizziness, syncope and headaches.  Hematological: Negative for adenopathy. Does not bruise/bleed easily.  Psychiatric/Behavioral: Negative for decreased concentration and dysphoric mood. The patient is not nervous/anxious.        Objective:   Physical Exam Constitutional:      General: She is not in acute distress.    Appearance: Normal appearance. She is well-developed. She is not ill-appearing.     Comments: overwt  HENT:     Head: Normocephalic and atraumatic.     Nose: Nose normal.     Mouth/Throat:     Mouth: Mucous membranes are moist.     Pharynx: Oropharynx is clear.  Eyes:     Conjunctiva/sclera: Conjunctivae normal.     Pupils: Pupils are equal, round, and reactive to light.  Neck:     Musculoskeletal: Normal range of motion and neck supple. No neck rigidity.      Thyroid: No thyromegaly.     Vascular: No carotid bruit or JVD.  Cardiovascular:     Rate and Rhythm: Normal rate and regular rhythm.     Heart sounds: Normal heart sounds. No gallop.   Pulmonary:     Effort: Pulmonary effort is normal. No respiratory distress.     Breath sounds: Normal breath sounds. No wheezing or rales.  Abdominal:     General: Bowel sounds are normal. There is no distension or abdominal bruit.     Palpations: Abdomen is soft. There is no mass.     Tenderness: There is no abdominal tenderness.  Lymphadenopathy:     Cervical: No cervical adenopathy.  Skin:    General: Skin is warm and dry.     Findings: No rash.  Neurological:     Mental Status: She is alert.     Deep Tendon Reflexes: Reflexes are normal and symmetric.           Assessment & Plan:   Problem List Items Addressed This Visit      Cardiovascular and Mediastinum   Essential hypertension    bp in fair control at this time  BP Readings from Last 1 Encounters:  03/26/18 124/82   No changes needed Most recent labs reviewed  Disc lifstyle change with low sodium diet and exercise  On hctz         Endocrine   Controlled type 2 diabetes mellitus without complication, without long-term current use of insulin ([facility]) - Primary    Improved with metformin and better diet/exercise Lab Results  Component Value Date   HGBA1C 6.5 (A) 03/26/2018   Enc her to keep it up Also enc to re schedule DM teaching appt  Urine microalb today (she cannot take ace)  Disc eye and foot care (sent for recent eye exam report) F/u 6 mo with lab prior       Relevant Orders   POCT glycosylated hemoglobin (Hb A1C) (Completed)   Microalbumin / creatinine urine ratio

## 2018-03-26 NOTE — Assessment & Plan Note (Signed)
Improved with metformin and better diet/exercise Lab Results  Component Value Date   HGBA1C 6.5 (A) 03/26/2018   Enc her to keep it up Also enc to re schedule DM teaching appt  Urine microalb today (she cannot take ace)  Disc eye and foot care (sent for recent eye exam report) F/u 6 mo with lab prior

## 2018-03-26 NOTE — Assessment & Plan Note (Signed)
bp in fair control at this time  BP Readings from Last 1 Encounters:  03/26/18 124/82   No changes needed Most recent labs reviewed  Disc lifstyle change with low sodium diet and exercise  On hctz

## 2018-03-26 NOTE — Patient Instructions (Addendum)
A1C is better at 6.5  Keep up the good work  Stay active  Keep working on weight loss and eating better   We will send for your eye exam   Also checking a urine test for protein for diabetes today   Follow up in 6 months with labs prior

## 2018-03-27 ENCOUNTER — Encounter: Payer: Self-pay | Admitting: *Deleted

## 2018-03-28 ENCOUNTER — Encounter: Payer: Self-pay | Admitting: Family Medicine

## 2018-04-22 ENCOUNTER — Ambulatory Visit (INDEPENDENT_AMBULATORY_CARE_PROVIDER_SITE_OTHER): Payer: Medicare HMO | Admitting: Family Medicine

## 2018-04-22 ENCOUNTER — Other Ambulatory Visit: Payer: Self-pay

## 2018-04-22 DIAGNOSIS — J069 Acute upper respiratory infection, unspecified: Secondary | ICD-10-CM

## 2018-04-22 DIAGNOSIS — B9789 Other viral agents as the cause of diseases classified elsewhere: Secondary | ICD-10-CM

## 2018-04-22 MED ORDER — GUAIFENESIN-CODEINE 100-10 MG/5ML PO SYRP: 5.0000 mL | ORAL_SOLUTION | Freq: Four times a day (QID) | ORAL | 0 refills | Status: DC | PRN

## 2018-04-22 MED ORDER — BENZONATATE 200 MG PO CAPS: 200.0000 mg | ORAL_CAPSULE | Freq: Three times a day (TID) | ORAL | 1 refills | Status: DC | PRN

## 2018-04-22 NOTE — Progress Notes (Signed)
   Subjective:    Patient ID: Connie Rowe, female    DOB: Mar 13, 1950, 68 y.o.   MRN: [id number]  HPI  Virtual Visit via Telephone Note  I connected with Connie Rowe on 04/22/18 at  3:00 PM EDT by telephone and verified that I am speaking with the correct person using two identifiers.   I discussed the limitations, risks, security and privacy concerns of performing an evaluation and management service by telephone and the availability of in person appointments. I also discussed with the patient that there may be a patient responsible charge related to this service. The patient expressed understanding and agreed to proceed.   History of Present Illness: Worked out in yard last week  ? Allergies  Cough -became productive today  Clear d/c   Eyes running -itchy No temp /fever  Some stuffy head - clear d/c also  Some headache but no facial pain   ST from cough  Ears itch   A little wheezing when she lies down    Otc: Benadryl  Had tessalon - left over/helps a bit  Has not been out to get cough medicine  flonase  Zyrtec   Cough is keeping her up all night  Non smoker   Observations/Objective:  Pt sounds hoarse Suspect viral (with addition of seasonal allergies no doubt)  Cough control will be the goal     Assessment and Plan: Viral uri with cough: With mildly productive cough/congestion and rhinorrhea  Adv rest and fluids  Continue flonase and zyrtec  Sent in tessalon and robitussin ac for cough (with caution of sedation) inst to watch for worse cough/sob/fever /sinus pain or other new symptoms Update if not starting to improve in a week or if worsening     Follow Up Instructions: See above-symptomatic care/rest and stay home  Take tessalon for cough  Also robitussin ac as needed with caution of sedation    I discussed the assessment and treatment plan with the patient. The patient was provided an opportunity to ask questions and all were answered. The  patient agreed with the plan and demonstrated an understanding of the instructions.   The patient was advised to call back or seek an in-person evaluation if the symptoms worsen or if the condition fails to improve as anticipated.  I provided  12 minutes of non-face-to-face time during this encounter.   Loura Pardon, MD      Review of Systems     Objective:   Physical Exam        Assessment & Plan:

## 2018-04-22 NOTE — Assessment & Plan Note (Signed)
With mildly productive cough/congestion and rhinorrhea  Adv rest and fluids  Continue flonase and zyrtec  Sent in tessalon and robitussin ac for cough (with caution of sedation) inst to watch for worse cough/sob/fever /sinus pain or other new symptoms Update if not starting to improve in a week or if worsening

## 2018-09-19 ENCOUNTER — Other Ambulatory Visit (INDEPENDENT_AMBULATORY_CARE_PROVIDER_SITE_OTHER): Payer: Medicare HMO

## 2018-09-19 DIAGNOSIS — I10 Essential (primary) hypertension: Secondary | ICD-10-CM

## 2018-09-19 DIAGNOSIS — E119 Type 2 diabetes mellitus without complications: Secondary | ICD-10-CM

## 2018-09-19 LAB — COMPREHENSIVE METABOLIC PANEL
ALT: 11 U/L (ref 0–35)
AST: 13 U/L (ref 0–37)
Albumin: 4.4 g/dL (ref 3.5–5.2)
Alkaline Phosphatase: 59 U/L (ref 39–117)
BUN: 12 mg/dL (ref 6–23)
CO2: 30 mEq/L (ref 19–32)
Calcium: 9.5 mg/dL (ref 8.4–10.5)
Chloride: 102 mEq/L (ref 96–112)
Creatinine, Ser: 1.05 mg/dL (ref 0.40–1.20)
GFR: 51.79 mL/min — ABNORMAL LOW (ref >60.00)
Glucose, Bld: 114 mg/dL — ABNORMAL HIGH (ref 70–99)
Potassium: 3.8 mEq/L (ref 3.5–5.1)
Sodium: 140 mEq/L (ref 135–145)
Total Bilirubin: 0.3 mg/dL (ref 0.2–1.2)
Total Protein: 7 g/dL (ref 6.0–8.3)

## 2018-09-19 LAB — LIPID PANEL
Cholesterol: 196 mg/dL (ref 0–200)
HDL: 62.5 mg/dL (ref >39.00)
LDL Cholesterol: 107 mg/dL — ABNORMAL HIGH (ref 0–99)
NonHDL: 133.29
Total CHOL/HDL Ratio: 3
Triglycerides: 131 mg/dL (ref 0.0–149.0)
VLDL: 26.2 mg/dL (ref 0.0–40.0)

## 2018-09-19 LAB — HEMOGLOBIN A1C: Hgb A1c MFr Bld: 6.4 % (ref 4.6–6.5)

## 2018-09-20 ENCOUNTER — Other Ambulatory Visit: Payer: Medicare HMO

## 2018-09-24 ENCOUNTER — Other Ambulatory Visit: Payer: Self-pay

## 2018-09-24 ENCOUNTER — Ambulatory Visit (INDEPENDENT_AMBULATORY_CARE_PROVIDER_SITE_OTHER): Payer: Medicare HMO | Admitting: Family Medicine

## 2018-09-24 ENCOUNTER — Encounter: Payer: Self-pay | Admitting: Family Medicine

## 2018-09-24 VITALS — BP 130/74 | HR 70 | Temp 97.3°F | Ht 64.5 in | Wt 169.3 lb

## 2018-09-24 DIAGNOSIS — K219 Gastro-esophageal reflux disease without esophagitis: Secondary | ICD-10-CM | POA: Diagnosis not present

## 2018-09-24 DIAGNOSIS — E1169 Type 2 diabetes mellitus with other specified complication: Secondary | ICD-10-CM | POA: Diagnosis not present

## 2018-09-24 DIAGNOSIS — Z23 Encounter for immunization: Secondary | ICD-10-CM

## 2018-09-24 DIAGNOSIS — E785 Hyperlipidemia, unspecified: Secondary | ICD-10-CM

## 2018-09-24 DIAGNOSIS — I10 Essential (primary) hypertension: Secondary | ICD-10-CM

## 2018-09-24 DIAGNOSIS — E119 Type 2 diabetes mellitus without complications: Secondary | ICD-10-CM

## 2018-09-24 DIAGNOSIS — E669 Obesity, unspecified: Secondary | ICD-10-CM

## 2018-09-24 MED ORDER — FAMOTIDINE 40 MG PO TABS: 40.0000 mg | ORAL_TABLET | Freq: Every day | ORAL | 11 refills | Status: DC

## 2018-09-24 NOTE — Assessment & Plan Note (Signed)
Disc goals for lipids and reasons to control them Rev last labs with pt Rev low sat fat diet in detail  LDL is up from 86 to 107-unsure why Taking top dose of pravastatin since other statins raise her LFTs Diet handout given  Enc to eat out less and stop fried foods

## 2018-09-24 NOTE — Patient Instructions (Addendum)
Try adding pepcid 40 mg each am for acid reflux  Stay on the generic protonix   For cholesterol Avoid red meat/ fried foods/ egg yolks/ fatty breakfast meats/ butter, cheese and high fat dairy/ and shellfish   Cholesterol is up significantly  Continue pravastatin   Flu shot today   Keep working on healthy diet and exercise

## 2018-09-24 NOTE — Assessment & Plan Note (Signed)
Discussed how this problem influences overall health and the risks it imposes  Reviewed plan for weight loss with lower calorie diet (via better food choices and also portion control or program like weight watchers) and exercise building up to or more than 30 minutes 5 days per week including some aerobic activity   Suggested prepping own food/avoid eating out  More walking as well

## 2018-09-24 NOTE — Progress Notes (Signed)
Subjective:    Patient ID: Connie Rowe, female    DOB: [birth year], 68 y.o.   MRN: [id number]  HPI  Here for f/u of chronic health problems  Not doing a lot Does go out to eat   Weight  Wt Readings from Last 3 Encounters:  09/24/18 169 lb 5 oz (76.8 kg)  03/26/18 169 lb 1 oz (76.7 kg)  01/28/18 176 lb (79.8 kg)  wt is stable  Tries to pick healthy foods when she eats out  Exercise- walking every day and found an old exercise book  28.61 kg/m   Cut back on soft drinks and drinking more water   Wants a flu shot today   bp is stable today  No cp or palpitations or headaches or edema  No side effects to medicines  BP Readings from Last 3 Encounters:  09/24/18 130/74  03/26/18 124/82  01/28/18 128/82      Lab Results  Component Value Date   CREATININE 1.05 09/19/2018   BUN 12 09/19/2018   NA 140 09/19/2018   K 3.8 09/19/2018   CL 102 09/19/2018   CO2 30 09/19/2018   Lab Results  Component Value Date   ALT 11 09/19/2018   AST 13 09/19/2018   ALKPHOS 59 09/19/2018   BILITOT 0.3 09/19/2018    DM2 Diabetes Home sugar results -she feels nervous inside at times-unsure if low  Infrequent and better after she eats Does not want to start checking levels /declines equpt at this time DM diet -working hard to stick to it (she craves sweets)  Eats apples/watermelon -- dislikes other fruit  Exercise -walking   Symptoms-more dry mouth  A1C last  Lab Results  Component Value Date   HGBA1C 6.4 09/19/2018  this is stable   No problems with medications -metformin  Renal protection-none Lab Results  Component Value Date   MICROALBUR 1.0 03/26/2018    Last eye exam  1/20-no retinopathy  Hyperlipidemia Lab Results  Component Value Date   CHOL 196 09/19/2018   CHOL 177 11/29/2017   CHOL 228 (H) 05/28/2017   Lab Results  Component Value Date   HDL 62.50 09/19/2018   HDL 53.00 11/29/2017   HDL 70.20 05/28/2017   Lab Results  Component Value Date   LDLCALC 107 (H) 09/19/2018   LDLCALC 86 11/29/2017   LDLCALC 125 (H) 05/28/2017   Lab Results  Component Value Date   TRIG 131.0 09/19/2018   TRIG 189.0 (H) 11/29/2017   TRIG 166.0 (H) 05/28/2017   Lab Results  Component Value Date   CHOLHDL 3 09/19/2018   CHOLHDL 3 11/29/2017   CHOLHDL 3 05/28/2017   Lab Results  Component Value Date   LDLDIRECT 126.0 06/03/2014   LDLDIRECT 122.4 09/14/2010   LDLDIRECT 132.2 04/16/2008   Taking pravastatin (took lipitor once and it raised liver enzymes)  No missed doses occ bacon - not often  She eats french fries  Not eating differently   Her GERD is bothering her  Taking protonix 40 mg bid  Watches her diet for this  Used to take zantac   Tries not to eat late  Gets the heartburn during the day  No nausea  No abd pain   Patient Active Problem List   Diagnosis Date Noted  . Viral URI with cough 04/22/2018  . Allergic rhinitis 09/03/2017  . Obesity (BMI 30-39.9) 01/10/2017  . Insomnia 11/27/2016  . Daytime somnolence 05/23/2016  . Snoring 05/23/2016  . Estrogen deficiency  10/19/2015  . Need for hepatitis C screening test 09/27/2015  . S/P right THA, AA 03/30/2015  . Pre-operative examination 02/24/2015  . S/P lumbar laminectomy 09/23/2014  . Essential hypertension 06/26/2014  . Encounter for Medicare annual wellness exam 06/03/2014  . Colon cancer screening 06/03/2014  . Sciatica 11/26/2013  . Controlled type 2 diabetes mellitus without complication, without long-term current use of insulin ([place]) 04/17/2013  . Hirsutism 12/06/2010  . Routine general medical examination at a health care facility 09/10/2010  . Other screening mammogram 09/01/2010  . Disorder of bone and cartilage 10/22/2006  . URINARY INCONTINENCE, STRESS 08/22/2006  . History of Helicobacter pylori infection 04/18/2006  . Hyperlipidemia associated with type 2 diabetes mellitus ([place]) 04/18/2006  . Depression with anxiety 04/18/2006  . EXTERNAL  HEMORRHOIDS 04/18/2006  . GERD 04/18/2006  . HIATAL HERNIA 04/18/2006  . OVERACTIVE BLADDER 04/18/2006  . HEMATURIA, MICROSCOPIC 04/18/2006  . PANCREATITIS, HX OF 04/18/2006  . DIVERTICULOSIS, COLON 01/02/2000   Past Medical History:  Diagnosis Date  . Arthritis   . Colon polyps   . Depression   . Esophageal stricture   . Falls   . GERD (gastroesophageal reflux disease)   . H/O fracture of humerus 2006  . History of bronchitis   . History of hiatal hernia   . Hyperlipidemia   . Hypertension   . Nocturia   . Osteopenia   . Pancreatitis    History of   Past Surgical History:  Procedure Laterality Date  . BLADDER REPAIR    . COLONOSCOPY    . DIAGNOSTIC LAPAROSCOPY     tubal ligation  . DILATION AND CURETTAGE OF UTERUS    . ESOPHAGEAL DILATION    . LUMBAR LAMINECTOMY/DECOMPRESSION MICRODISCECTOMY Right 09/23/2014   Procedure: Laminectomy and Foraminotomy - Lumbar three- lumbar four - right ;  Surgeon: Eustace Moore, MD;  Location: [facility];  Service: Neurosurgery;  Laterality: Right;  . MULTIPLE TOOTH EXTRACTIONS    . TONSILLECTOMY    . TOTAL HIP ARTHROPLASTY Right 03/30/2015   Procedure: RIGHT TOTAL HIP ARTHROPLASTY ANTERIOR APPROACH;  Surgeon: Paralee Cancel, MD;  Location: [facility];  Service: Orthopedics;  Laterality: Right;  . TOTAL HIP ARTHROPLASTY Left 01/09/2017   Procedure: LEFT TOTAL HIP ARTHROPLASTY ANTERIOR APPROACH;  Surgeon: Paralee Cancel, MD;  Location: [facility];  Service: Orthopedics;  Laterality: Left;  70 mins  . TUBAL LIGATION    . UPPER GI ENDOSCOPY     Social History   Tobacco Use  . Smoking status: Never Smoker  . Smokeless tobacco: Never Used  Substance Use Topics  . Alcohol use: No    Alcohol/week: 0.0 standard drinks  . Drug use: No   Family History  Problem Relation Age of Onset  . Stroke Mother   . Hypertension Mother   . Cancer Mother        colon CA  . Colon cancer Mother   . Drug abuse Son   . Stroke Brother   . Colon polyps Neg Hx    . Crohn's disease Neg Hx   . Pancreatic cancer Neg Hx   . Rectal cancer Neg Hx   . Stomach cancer Neg Hx   . Ulcerative colitis Neg Hx   . Breast cancer Neg Hx    Allergies  Allergen Reactions  . Ace Inhibitors Cough  . Ambien [Zolpidem Tartrate] Other (See Comments)    Affected memory   Current Outpatient Medications on File Prior to Visit  Medication Sig Dispense Refill  .  Calcium Carbonate-Vitamin D (CALCIUM-VITAMIN D) 500-200 MG-UNIT per tablet Take 1 tablet by mouth daily.      Marland Kitchen docusate sodium (COLACE) 100 MG capsule Take 1 capsule (100 mg total) by mouth 2 (two) times daily. 10 capsule 0  . FLUoxetine (PROZAC) 40 MG capsule Take 1 capsule (40 mg total) by mouth daily. 90 capsule 3  . fluticasone (FLONASE) 50 MCG/ACT nasal spray Place 2 sprays into both nostrils daily. 48 g 3  . guaiFENesin-codeine (ROBITUSSIN AC) 100-10 MG/5ML syrup Take 5 mLs by mouth 4 (four) times daily as needed for cough. Caution of sedation 120 mL 0  . hydrochlorothiazide (MICROZIDE) 12.5 MG capsule Take 1 capsule (12.5 mg total) by mouth daily. 90 capsule 3  . metFORMIN (GLUCOPHAGE-XR) 500 MG 24 hr tablet Take 1 tablet (500 mg total) by mouth daily with breakfast. 30 tablet 11  . Multiple Vitamins-Minerals (MULTIVITAMIN PO) Take 1 tablet by mouth daily.    . pantoprazole (PROTONIX) 40 MG tablet Take 1 tablet (40 mg total) by mouth 2 (two) times daily. 180 tablet 3  . pravastatin (PRAVACHOL) 80 MG tablet Take 1 tablet (80 mg total) by mouth daily. 90 tablet 3  . Tetrahydrozoline HCl (VISINE OP) Place 1 drop into both eyes as needed (for dry eyes).    . traZODone (DESYREL) 50 MG tablet Take 0.5-1 tablets (25-50 mg total) by mouth at bedtime as needed for sleep. 90 tablet 3   No current facility-administered medications on file prior to visit.     Review of Systems  Constitutional: Negative for activity change, appetite change, fatigue, fever and unexpected weight change.  HENT: Negative for congestion,  ear pain, rhinorrhea, sinus pressure and sore throat.   Eyes: Negative for pain, redness and visual disturbance.  Respiratory: Negative for cough, shortness of breath and wheezing.   Cardiovascular: Negative for chest pain and palpitations.  Gastrointestinal: Negative for abdominal pain, blood in stool, constipation and diarrhea.       Heartburn/acid reflux  Endocrine: Negative for polydipsia and polyuria.  Genitourinary: Negative for dysuria, frequency and urgency.  Musculoskeletal: Negative for arthralgias, back pain and myalgias.  Skin: Negative for pallor and rash.  Allergic/Immunologic: Negative for environmental allergies.  Neurological: Negative for dizziness, syncope and headaches.  Hematological: Negative for adenopathy. Does not bruise/bleed easily.  Psychiatric/Behavioral: Negative for decreased concentration and dysphoric mood. The patient is not nervous/anxious.        Objective:   Physical Exam Constitutional:      General: She is not in acute distress.    Appearance: Normal appearance. She is well-developed.     Comments: overwt   HENT:     Head: Normocephalic and atraumatic.     Mouth/Throat:     Pharynx: Oropharynx is clear.  Eyes:     General: No scleral icterus.    Conjunctiva/sclera: Conjunctivae normal.     Pupils: Pupils are equal, round, and reactive to light.  Neck:     Musculoskeletal: Normal range of motion and neck supple. No neck rigidity or muscular tenderness.     Thyroid: No thyromegaly.     Vascular: No carotid bruit or JVD.  Cardiovascular:     Rate and Rhythm: Normal rate and regular rhythm.     Pulses: Normal pulses.     Heart sounds: Normal heart sounds. No gallop.   Pulmonary:     Effort: Pulmonary effort is normal. No respiratory distress.     Breath sounds: Normal breath sounds. No wheezing or rales.  Comments: Good air exch Abdominal:     General: Bowel sounds are normal. There is no distension or abdominal bruit.     Palpations:  Abdomen is soft. There is no mass.     Tenderness: There is no abdominal tenderness.     Hernia: No hernia is present.     Comments: No epigastric tenderness  Musculoskeletal:     Right lower leg: No edema.     Left lower leg: No edema.  Lymphadenopathy:     Cervical: No cervical adenopathy.  Skin:    General: Skin is warm and dry.     Findings: No rash.  Neurological:     Mental Status: She is alert. Mental status is at baseline.     Sensory: No sensory deficit.     Gait: Gait normal.     Deep Tendon Reflexes: Reflexes are normal and symmetric. Reflexes normal.  Psychiatric:        Mood and Affect: Mood normal.           Assessment & Plan:   Problem List Items Addressed This Visit      Cardiovascular and Mediastinum   Essential hypertension    bp in fair control at this time  BP Readings from Last 1 Encounters:  09/24/18 130/74   No changes needed Most recent labs reviewed  Disc lifstyle change with low sodium diet and exercise          Digestive   GERD    Taking protonix but had to stop ranitidine due [facility] and symptoms are much worse Will try famotodine 40 mg daily  Also enc her to watch diet for triggers Also wt loss  inst to update if no significant improvement in several weeks       Relevant Medications   famotidine (PEPCID) 40 MG tablet     Endocrine   Hyperlipidemia associated with type 2 diabetes mellitus ([place])    Disc goals for lipids and reasons to control them Rev last labs with pt Rev low sat fat diet in detail  LDL is up from 86 to 107-unsure why Taking top dose of pravastatin since other statins raise her LFTs Diet handout given  Enc to eat out less and stop fried foods        Controlled type 2 diabetes mellitus without complication, without long-term current use of insulin ([place]) - Primary    Lab Results  Component Value Date   HGBA1C 6.4 09/19/2018   Controlled well  Enc wt loss/diet/exercise  Eye exam utd  microalb utd         Other   Obesity (BMI 30-39.9)    Discussed how this problem influences overall health and the risks it imposes  Reviewed plan for weight loss with lower calorie diet (via better food choices and also portion control or program like weight watchers) and exercise building up to or more than 30 minutes 5 days per week including some aerobic activity   Suggested prepping own food/avoid eating out  More walking as well       Other Visit Diagnoses    Need for influenza vaccination       Relevant Orders   Flu Vaccine QUAD 6+ mos PF IM (Fluarix Quad PF) (Completed)

## 2018-09-24 NOTE — Assessment & Plan Note (Signed)
Lab Results  Component Value Date   HGBA1C 6.4 09/19/2018   Controlled well  Enc wt loss/diet/exercise  Eye exam utd microalb utd

## 2018-09-24 NOTE — Assessment & Plan Note (Signed)
bp in fair control at this time  BP Readings from Last 1 Encounters:  09/24/18 130/74   No changes needed Most recent labs reviewed  Disc lifstyle change with low sodium diet and exercise

## 2018-09-24 NOTE — Assessment & Plan Note (Signed)
Taking protonix but had to stop ranitidine due [facility] and symptoms are much worse Will try famotodine 40 mg daily  Also enc her to watch diet for triggers Also wt loss  inst to update if no significant improvement in several weeks

## 2018-10-01 ENCOUNTER — Telehealth: Payer: Self-pay | Admitting: *Deleted

## 2018-10-01 DIAGNOSIS — J069 Acute upper respiratory infection, unspecified: Secondary | ICD-10-CM

## 2018-10-01 DIAGNOSIS — K219 Gastro-esophageal reflux disease without esophagitis: Secondary | ICD-10-CM

## 2018-10-01 NOTE — Telephone Encounter (Signed)
Referral done Will route to [facility]  Let her know the office will call

## 2018-10-01 NOTE — Telephone Encounter (Signed)
Pt notified of Dr. Marliss Coots instructions and verbalized understanding. Pt is okay going back to GI she said her old doctor retired so she is okay seeing whoever Dr. Glori Bickers recommends.   Pt said she is having trouble swallowing. She said she just ate a sandwich and it felt really "tight" going down and it felt like it went down throat really slowly. Pt also said she is getting choked when she drinks liquids also. Pt also said she feels like a "bubble popped" in her stomach near her left breast recently also.  Pt said she isn't having any abd pain it's all mostly in the middle of her chest. Pt said she has "been eating tums" all day with no relief of sxs.  Pt given info about dexilant she will check pharmacy about cost of med and let us know if it's affordable

## 2018-10-01 NOTE — Telephone Encounter (Signed)
Tell her to increase her protonix to bid and continue the pepcid also  Let me know how she is doing later in the week  If worse or sob Cloria Spring cp-go to ER  (I think she does not think this is heart related)

## 2018-10-01 NOTE — Telephone Encounter (Signed)
I reviewed her GI notes-last EGD was a while ago (she did have a stricture) - it may be time to get back to GI -is she ok with a referral  Any trouble swallowing? (food getting stuck) Stomach pain (upper abdomen)?  Increase pepcid to bid  Continue protonix bid   Have antacids helped in the past- tums, etc?   There is another ppi I like called dexilant but it is expensive (unsure if it would work better than protonix) Please ask her to call her pharmacy to see if they can tell what her copay would be for that medication-if covered it may be worth a try

## 2018-10-01 NOTE — Telephone Encounter (Signed)
Patient called stating that she saw Dr. Glori Bickers last week for a checkup. Patient stated that she is on Pantoprazole and Dr. Glori Bickers added Pepcid. Patient stated that she is having a lot of pressure in the center of her chest today and burping a lot. Patient stated that she has a history of hiatal hernia and wondering if that is what is going on. Patient stated that she is not having any SOB, leg arm pain, but is having bilateral shoulder pain which she thinks is coming from her chest. Patient stated that she does not think that it is related to her heart because she is very familiar with heart problems. Patient wants to know what Dr. Glori Bickers recommends?

## 2018-10-01 NOTE — Telephone Encounter (Signed)
Pt is already taking protonix BID and Rx states that on med list. Pt said she's taking meds as prescribed and is still having issues so wants to know what should she do

## 2018-10-02 NOTE — Telephone Encounter (Signed)
I left a detailed message (okay per DPR) on patient's phone with Dr. Alba Cory instructions.    Patient is to call back to f/u with Dr. Glori Bickers in the meantime.  I will also forward this to Anastasiya (who is covering referrals tomorrow) to see if she can reach out to the patient with any details around her appointment.   Thanks.

## 2018-10-02 NOTE — Telephone Encounter (Signed)
Patient left a voicemail stating that she talked with Dr. Marliss Coots assistant yesterday and has been waiting on a call about a referral. Patient stated that she is in a lot of pain and needs this done.

## 2018-10-02 NOTE — Telephone Encounter (Signed)
Sounds like Rosaria Ferries got her in mid sept (soonest available) and will see about putting her on a cancellation list also  Please have her f/u with me for a visit in the meantime

## 2018-10-03 ENCOUNTER — Other Ambulatory Visit: Payer: Self-pay

## 2018-10-03 DIAGNOSIS — Z20822 Contact with and (suspected) exposure to covid-19: Secondary | ICD-10-CM

## 2018-10-03 DIAGNOSIS — J069 Acute upper respiratory infection, unspecified: Secondary | ICD-10-CM | POA: Insufficient documentation

## 2018-10-03 DIAGNOSIS — R6889 Other general symptoms and signs: Secondary | ICD-10-CM | POA: Diagnosis not present

## 2018-10-03 NOTE — Telephone Encounter (Signed)
Pt notified of all of Dr. Marliss Coots comments and instructions and verbalized understanding. Pt said she wants covid testing done, address given to get test done (please put order in).  Pt will await test results and if negative she wants to see Dr. Glori Bickers next week, she doesn't want to go to ER or UC because she said "they don't do anything" so she wants to see Dr. Glori Bickers but she will wait for her Covid test results 1st

## 2018-10-03 NOTE — Telephone Encounter (Signed)
I put the order in (it may have also been ordered already) Thanks

## 2018-10-03 NOTE — Telephone Encounter (Signed)
Spoke with patient. There is an appointment for tomorrow 10/04/2018 at 4:15 pm we put on hold for the patient. I wanted to check with Dr Glori Bickers if patient can come in or have to do virtual visit due to patient developed a cough 2 days ago, post nasal drip is present. No other symptoms like sore throat, diarrhea, loss of smell or taste, fever, body aches and has not been around anyone that is positive for COVID that she knows of. Patient states chest pressure sensation is somewhat better but pain is persistent and she can feel food going down when she swallows and it hurts. Please advise. Thank you.

## 2018-10-03 NOTE — Telephone Encounter (Signed)
Called to try and reach patient again, left a message to call back

## 2018-10-03 NOTE — Addendum Note (Signed)
Addended by: Loura Pardon A on: 10/03/2018 01:16 PM   Modules accepted: Orders

## 2018-10-03 NOTE — Telephone Encounter (Signed)
I cannot see her in the office due to her respiratory symptoms (unfortunately because I needed to examine her)  We can do a virtual visit tomorrow but I am unsure how helpful it would be w/o an exam.  Urgent care is an option if she feels really bad. (also if she wants me to order a covid test I can do that today so let me know)   Rosaria Ferries did get her a GI appt for later this mo and I told her to go on a wait list for cancellations as well. If GI symptoms are severe of course go to the ER.

## 2018-10-04 LAB — NOVEL CORONAVIRUS, NAA: SARS-CoV-2, NAA: NOT DETECTED

## 2018-10-14 ENCOUNTER — Encounter: Payer: Self-pay | Admitting: Gastroenterology

## 2018-10-14 ENCOUNTER — Ambulatory Visit (INDEPENDENT_AMBULATORY_CARE_PROVIDER_SITE_OTHER): Payer: Medicare HMO | Admitting: Gastroenterology

## 2018-10-14 ENCOUNTER — Other Ambulatory Visit: Payer: Self-pay

## 2018-10-14 VITALS — BP 122/76 | HR 83 | Temp 97.8°F | Ht 65.0 in | Wt 166.0 lb

## 2018-10-14 DIAGNOSIS — K219 Gastro-esophageal reflux disease without esophagitis: Secondary | ICD-10-CM | POA: Diagnosis not present

## 2018-10-14 DIAGNOSIS — R1310 Dysphagia, unspecified: Secondary | ICD-10-CM | POA: Diagnosis not present

## 2018-10-14 DIAGNOSIS — R0789 Other chest pain: Secondary | ICD-10-CM | POA: Insufficient documentation

## 2018-10-14 MED ORDER — SUCRALFATE 1 GM/10ML PO SUSP: 1.0000 g | Freq: Four times a day (QID) | ORAL | 1 refills | Status: DC

## 2018-10-14 NOTE — Patient Instructions (Signed)
If you are age 70 or older, your body mass index should be between 23-30. Your Body mass index is 27.62 kg/m. If this is out of the aforementioned range listed, please consider follow up with your Primary Care Provider.  If you are age 61 or younger, your body mass index should be between 19-25. Your Body mass index is 27.62 kg/m. If this is out of the aformentioned range listed, please consider follow up with your Primary Care Provider.   You have been scheduled for an endoscopy. Please follow written instructions given to you at your visit today. If you use inhalers (even only as needed), please bring them with you on the day of your procedure. Your physician has requested that you go to www.startemmi.com and enter the access code given to you at your visit today. This web site gives a general overview about your procedure. However, you should still follow specific instructions given to you by our office regarding your preparation for the procedure.  We have sent the following medications to your pharmacy for you to pick up at your convenience: Carafate  Recall colonoscopy due 01/2020.  Thank you for choosing me and [facility].   Alonza Bogus, PA-C

## 2018-10-14 NOTE — Progress Notes (Signed)
10/14/2018 Connie Rowe [id number] 04-17-1950   HISTORY OF PRESENT ILLNESS: This is a 68 year old female is a patient of [name].  She presents to our office today with complaints of worsening esophageal reflux.  She says that she has been on pantoprazole 40 mg BID for quite some time.  Recently she has been having more issues despite that medication, however.  She describes a lot of increased belching and pressure or tightness in her chest.  She is sure that it is not her heart as she has had similar symptoms in the past and it has always been reflux related.  She says that usually these episodes come and go pretty quickly, but she had several days in a row that would not let up a couple of weeks ago.  Her PCP added Pepcid 40 mg twice daily to her regimen.  Since then symptoms have improved, but still having a lot of reflux about every time that she eats.  She has history of esophageal stricture and had that dilated on her last EGD in April 2010.  Currently she is not having many symptoms of dysphasia, but says that occasionally pills feel like they get stuck.  Past Medical History:  Diagnosis Date  . Arthritis   . Colon polyps   . Depression   . Esophageal stricture   . Falls   . GERD (gastroesophageal reflux disease)   . H/O fracture of humerus 2006  . History of bronchitis   . History of hiatal hernia   . Hyperlipidemia   . Hypertension   . Nocturia   . Osteopenia   . Pancreatitis    History of   Past Surgical History:  Procedure Laterality Date  . BLADDER REPAIR    . COLONOSCOPY    . DIAGNOSTIC LAPAROSCOPY     tubal ligation  . DILATION AND CURETTAGE OF UTERUS    . ESOPHAGEAL DILATION    . LUMBAR LAMINECTOMY/DECOMPRESSION MICRODISCECTOMY Right 09/23/2014   Procedure: Laminectomy and Foraminotomy - Lumbar three- lumbar four - right ;  Surgeon: Eustace Moore, MD;  Location: [facility];  Service: Neurosurgery;  Laterality: Right;  . MULTIPLE TOOTH EXTRACTIONS     . TONSILLECTOMY    . TOTAL HIP ARTHROPLASTY Right 03/30/2015   Procedure: RIGHT TOTAL HIP ARTHROPLASTY ANTERIOR APPROACH;  Surgeon: Paralee Cancel, MD;  Location: [facility];  Service: Orthopedics;  Laterality: Right;  . TOTAL HIP ARTHROPLASTY Left 01/09/2017   Procedure: LEFT TOTAL HIP ARTHROPLASTY ANTERIOR APPROACH;  Surgeon: Paralee Cancel, MD;  Location: [facility];  Service: Orthopedics;  Laterality: Left;  70 mins  . TUBAL LIGATION    . UPPER GI ENDOSCOPY      reports that she has never smoked. She has never used smokeless tobacco. She reports that she does not drink alcohol or use drugs. family history includes Colon cancer in her mother; Drug abuse in her son; Hypertension in her mother; Stroke in her brother and mother. Allergies  Allergen Reactions  . Ace Inhibitors Cough  . Ambien [Zolpidem Tartrate] Other (See Comments)    Affected memory      Outpatient Encounter Medications as of 10/14/2018  Medication Sig  . Calcium Carbonate-Vitamin D (CALCIUM-VITAMIN D) 500-200 MG-UNIT per tablet Take 1 tablet by mouth daily.    Marland Kitchen docusate sodium (COLACE) 100 MG capsule Take 1 capsule (100 mg total) by mouth 2 (two) times daily.  . famotidine (PEPCID) 40 MG tablet Take 1 tablet (40 mg total) by mouth  daily. (Patient taking differently: Take 40 mg by mouth 2 (two) times daily. )  . FLUoxetine (PROZAC) 40 MG capsule Take 1 capsule (40 mg total) by mouth daily.  . fluticasone (FLONASE) 50 MCG/ACT nasal spray Place 2 sprays into both nostrils daily.  Marland Kitchen guaiFENesin-codeine (ROBITUSSIN AC) 100-10 MG/5ML syrup Take 5 mLs by mouth 4 (four) times daily as needed for cough. Caution of sedation  . hydrochlorothiazide (MICROZIDE) 12.5 MG capsule Take 1 capsule (12.5 mg total) by mouth daily.  . metFORMIN (GLUCOPHAGE-XR) 500 MG 24 hr tablet Take 1 tablet (500 mg total) by mouth daily with breakfast.  . Multiple Vitamins-Minerals (MULTIVITAMIN PO) Take 1 tablet by mouth daily.  . pantoprazole (PROTONIX) 40 MG  tablet Take 1 tablet (40 mg total) by mouth 2 (two) times daily.  . pravastatin (PRAVACHOL) 80 MG tablet Take 1 tablet (80 mg total) by mouth daily.  . Tetrahydrozoline HCl (VISINE OP) Place 1 drop into both eyes as needed (for dry eyes).  . traZODone (DESYREL) 50 MG tablet Take 0.5-1 tablets (25-50 mg total) by mouth at bedtime as needed for sleep.  . sucralfate (CARAFATE) 1 GM/10ML suspension Take 10 mLs (1 g total) by mouth 4 (four) times daily. Take before meals and at bedtime.   No facility-administered encounter medications on file as of 10/14/2018.      REVIEW OF SYSTEMS  : All other systems reviewed and negative except where noted in the History of Present Illness.   PHYSICAL EXAM: BP 122/76   Pulse 83   Temp 97.8 F (36.6 C)   Ht 5\' 5"  (1.651 m)   Wt 166 lb (75.3 kg)   BMI 27.62 kg/m  General: Well developed white female in no acute distress Head: Normocephalic and atraumatic Eyes:  Sclerae anicteric, conjunctiva pink. Ears: Normal auditory acuity Lungs: Clear throughout to auscultation; no increased WOB. Heart: Regular rate and rhythm; no M/R/G. Abdomen: Soft, non-distended.  BS present.  Non-tender. Musculoskeletal: Symmetrical with no gross deformities  Skin: No lesions on visible extremities Extremities: No edema  Neurological: Alert oriented x 4, grossly non-focal Psychological:  Alert and cooperative. Normal mood and affect  ASSESSMENT AND PLAN: *GERD/dysphagia/atypical chest pain: Still having reflux type symptoms despite pantoprazole 40 mg twice daily, which she has been on for quite some time.  Has improved somewhat with the addition of Pepcid 40 mg twice daily.  Will schedule EGD with possible dilation to evaluate refractory symptoms--scheduled with Dr. Silverio Decamp.  She does have history of a stricture that was dilated 10 years ago.  Currently with only mild symptoms of dysphasia, mostly to pills.  Will add Carafate suspension 4 times daily--prescription sent.   **The risks, benefits, and alternatives to EGD with dilation were discussed with the patient and she consents to proceed.  CC:  Tower, Wynelle Fanny, MD

## 2018-10-23 ENCOUNTER — Encounter: Payer: Self-pay | Admitting: Gastroenterology

## 2018-10-29 ENCOUNTER — Telehealth: Payer: Self-pay

## 2018-10-29 NOTE — Telephone Encounter (Signed)
Covid-19 screening questions   Do you now or have you had a fever in the last 14 days? NO   Do you have any respiratory symptoms of shortness of breath or cough now or in the last 14 days? NO  Do you have any family members or close contacts with diagnosed or suspected Covid-19 in the past 14 days? NO  Have you been tested for Covid-19 and found to be positive? NO        

## 2018-10-30 ENCOUNTER — Encounter: Payer: Self-pay | Admitting: Gastroenterology

## 2018-10-30 ENCOUNTER — Ambulatory Visit (AMBULATORY_SURGERY_CENTER): Payer: Medicare HMO | Admitting: Gastroenterology

## 2018-10-30 ENCOUNTER — Other Ambulatory Visit: Payer: Self-pay

## 2018-10-30 VITALS — BP 124/54 | HR 70 | Temp 98.3°F | Resp 16 | Ht 65.0 in | Wt 166.0 lb

## 2018-10-30 DIAGNOSIS — K210 Gastro-esophageal reflux disease with esophagitis: Secondary | ICD-10-CM

## 2018-10-30 DIAGNOSIS — K317 Polyp of stomach and duodenum: Secondary | ICD-10-CM

## 2018-10-30 DIAGNOSIS — K219 Gastro-esophageal reflux disease without esophagitis: Secondary | ICD-10-CM

## 2018-10-30 DIAGNOSIS — K2970 Gastritis, unspecified, without bleeding: Secondary | ICD-10-CM | POA: Diagnosis not present

## 2018-10-30 DIAGNOSIS — K2950 Unspecified chronic gastritis without bleeding: Secondary | ICD-10-CM | POA: Diagnosis not present

## 2018-10-30 DIAGNOSIS — R1310 Dysphagia, unspecified: Secondary | ICD-10-CM | POA: Diagnosis not present

## 2018-10-30 MED ORDER — SODIUM CHLORIDE 0.9 % IV SOLN: 500.0000 mL | Freq: Once | INTRAVENOUS | Status: DC

## 2018-10-30 NOTE — Patient Instructions (Signed)
YOU HAD AN ENDOSCOPIC PROCEDURE TODAY AT THE [facility]:   Refer to the procedure report that was given to you for any specific questions about what was found during the examination.  If the procedure report does not answer your questions, please call your gastroenterologist to clarify.  If you requested that your care partner not be given the details of your procedure findings, then the procedure report has been included in a sealed envelope for you to review at your convenience later.  YOU SHOULD EXPECT: Some feelings of bloating in the abdomen. Passage of more gas than usual.  Walking can help get rid of the air that was put into your GI tract during the procedure and reduce the bloating. If you had a lower endoscopy (such as a colonoscopy or flexible sigmoidoscopy) you may notice spotting of blood in your stool or on the toilet paper. If you underwent a bowel prep for your procedure, you may not have a normal bowel movement for a few days.  Please Note:  You might notice some irritation and congestion in your nose or some drainage.  This is from the oxygen used during your procedure.  There is no need for concern and it should clear up in a day or so.  SYMPTOMS TO REPORT IMMEDIATELY:   Following upper endoscopy (EGD)  Vomiting of blood or coffee ground material  New chest pain or pain under the shoulder blades  Painful or persistently difficult swallowing  New shortness of breath  Fever of 100F or higher  Black, tarry-looking stools  For urgent or emergent issues, a gastroenterologist can be reached at any hour by calling [phone].   DIET:  We do recommend a small meal at first, but then you may proceed to your regular diet.  Drink plenty of fluids but you should avoid alcoholic beverages for 24 hours.  ACTIVITY:  You should plan to take it easy for the rest of today and you should NOT DRIVE or use heavy machinery until tomorrow (because of the sedation medicines used  during the test).    FOLLOW UP: Our staff will call the number listed on your records 48-72 hours following your procedure to check on you and address any questions or concerns that you may have regarding the information given to you following your procedure. If we do not reach you, we will leave a message.  We will attempt to reach you two times.  During this call, we will ask if you have developed any symptoms of COVID 19. If you develop any symptoms (ie: fever, flu-like symptoms, shortness of breath, cough etc.) before then, please call [phone].  If you test positive for Covid 19 in the 2 weeks post procedure, please call and report this information to us.    If any biopsies were taken you will be contacted by phone or by letter within the next 1-3 weeks.  Please call us at [phone] if you have not heard about the biopsies in 3 weeks.    SIGNATURES/CONFIDENTIALITY: You and/or your care partner have signed paperwork which will be entered into your electronic medical record.  These signatures attest to the fact that that the information above on your After Visit Summary has been reviewed and is understood.  Full responsibility of the confidentiality of this discharge information lies with you and/or your care-partner. 

## 2018-10-30 NOTE — Op Note (Signed)
[facility] Patient Name: Connie Rowe Procedure Date: 10/30/2018 10:24 AM MRN: [id number] Endoscopist: Mauri Pole , MD Age: 69 Referring MD:  Date of Birth: 1950-07-24 Gender: Female Account #: [id number] Procedure:                Upper GI endoscopy Indications:              Dysphagia, Esophageal reflux symptoms that persist                            despite appropriate therapy Medicines:                Monitored Anesthesia Care Procedure:                Pre-Anesthesia Assessment:                           - Prior to the procedure, a History and Physical                            was performed, and patient medications and                            allergies were reviewed. The patient's tolerance of                            previous anesthesia was also reviewed. The risks                            and benefits of the procedure and the sedation                            options and risks were discussed with the patient.                            All questions were answered, and informed consent                            was obtained. Prior Anticoagulants: The patient has                            taken no previous anticoagulant or antiplatelet                            agents. ASA Grade Assessment: II - A patient with                            mild systemic disease. After reviewing the risks                            and benefits, the patient was deemed in                            satisfactory condition to undergo the procedure.  After obtaining informed consent, the endoscope was                            passed under direct vision. Throughout the                            procedure, the patient's blood pressure, pulse, and                            oxygen saturations were monitored continuously. The                            Endoscope was introduced through the mouth, and                            advanced to the second  part of duodenum. The upper                            GI endoscopy was accomplished without difficulty.                            The patient tolerated the procedure well. Scope In: Scope Out: 10:48:44 AM Findings:                 The Z-line was regular and was found 38 cm from the                            incisors.                           LA Grade A (one or more mucosal breaks less than 5                            mm, not extending between tops of 2 mucosal folds)                            esophagitis was found 34 to 38 cm from the                            incisors. Biopsies were taken with a cold forceps                            for histology. Biopsies were obtained from the                            proximal and distal esophagus with cold forceps for                            histology of suspected eosinophilic esophagitis.                           Patchy mild inflammation characterized by  congestion (edema) and erythema was found in the                            entire examined stomach. Biopsies were taken with a                            cold forceps for Helicobacter pylori testing.                           A single 8 mm sessile polyp with bleeding and                            stigmata of recent bleeding was found at the                            incisura. The polyp was removed with a hot snare.                            Resection and retrieval were complete.                           The examined duodenum was normal. Complications:            No immediate complications. Estimated Blood Loss:     Estimated blood loss was minimal. Impression:               - Z-line regular, 38 cm from the incisors.                           - LA Grade A reflux esophagitis. Biopsied.                           - Gastritis. Biopsied.                           - A single gastric polyp. Resected and retrieved.                           - Normal examined  duodenum. Recommendation:           - Resume previous diet.                           - Continue present medications.                           - Await pathology results.                           - Return to GI clinic at the next available                            appointment. Mauri Pole, MD 10/30/2018 10:56:52 AM This report has been signed electronically.

## 2018-10-30 NOTE — Progress Notes (Signed)
Pt's states no medical or surgical changes since previsit or office visit.  [place]

## 2018-10-30 NOTE — Progress Notes (Signed)
Called to room to assist during endoscopic procedure.  Patient ID and intended procedure confirmed with present staff. Received instructions for my participation in the procedure from the performing physician.  

## 2018-10-30 NOTE — Progress Notes (Signed)
PT taken to PACU. Monitors in place. VSS. Report given to RN. 

## 2018-11-01 ENCOUNTER — Telehealth: Payer: Self-pay

## 2018-11-01 NOTE — Telephone Encounter (Signed)
First post procedure follow up call, no answer 

## 2018-11-01 NOTE — Telephone Encounter (Signed)
  Follow up Call-  Call back number 10/30/2018  Post procedure Call Back phone  # [phone]  Permission to leave phone message Yes  Some recent data might be hidden     Patient questions:  Do you have a fever, pain , or abdominal swelling? No. Pain Score  0 *  Have you tolerated food without any problems? Yes.    Have you been able to return to your normal activities? Yes.    Do you have any questions about your discharge instructions: Diet   No. Medications  No. Follow up visit  No.  Do you have questions or concerns about your Care? No.  Actions: * If pain score is 4 or above: No action needed, pain <4. 1. Have you developed a fever since your procedure? no  2.   Have you had an respiratory symptoms (SOB or cough) since your procedure? no  3.   Have you tested positive for COVID 19 since your procedure no  4.   Have you had any family members/close contacts diagnosed with the COVID 19 since your procedure?  no   If yes to any of these questions please route to Joylene John, RN and Alphonsa Gin, Therapist, sports.

## 2018-11-08 ENCOUNTER — Encounter: Payer: Self-pay | Admitting: Gastroenterology

## 2018-11-18 NOTE — Progress Notes (Signed)
Reviewed and agree with documentation and assessment and plan. K. Veena  , MD   

## 2018-12-02 LAB — HM DIABETES EYE EXAM

## 2018-12-11 ENCOUNTER — Ambulatory Visit: Payer: Medicare HMO | Admitting: Gastroenterology

## 2018-12-24 ENCOUNTER — Other Ambulatory Visit: Payer: Self-pay | Admitting: Family Medicine

## 2019-01-15 ENCOUNTER — Other Ambulatory Visit: Payer: Self-pay | Admitting: Family Medicine

## 2019-03-26 ENCOUNTER — Telehealth: Payer: Self-pay

## 2019-03-26 NOTE — Telephone Encounter (Signed)
LVM w COVID screen, front door and back lab info 2.24.2021 TLJ

## 2019-03-27 ENCOUNTER — Telehealth: Payer: Self-pay | Admitting: Family Medicine

## 2019-03-27 DIAGNOSIS — I10 Essential (primary) hypertension: Secondary | ICD-10-CM

## 2019-03-27 DIAGNOSIS — E119 Type 2 diabetes mellitus without complications: Secondary | ICD-10-CM

## 2019-03-27 DIAGNOSIS — E1169 Type 2 diabetes mellitus with other specified complication: Secondary | ICD-10-CM

## 2019-03-27 NOTE — Telephone Encounter (Signed)
-----   Message from Ellamae Sia sent at 03/14/2019  9:56 AM EST ----- Regarding: Lab orders for Friday, 2.26.21 Patient is scheduled for CPX labs, please order future labs, Thanks , Karna Christmas

## 2019-03-28 ENCOUNTER — Other Ambulatory Visit: Payer: Self-pay

## 2019-03-28 ENCOUNTER — Other Ambulatory Visit (INDEPENDENT_AMBULATORY_CARE_PROVIDER_SITE_OTHER): Payer: Medicare HMO

## 2019-03-28 DIAGNOSIS — E1169 Type 2 diabetes mellitus with other specified complication: Secondary | ICD-10-CM

## 2019-03-28 DIAGNOSIS — E119 Type 2 diabetes mellitus without complications: Secondary | ICD-10-CM | POA: Diagnosis not present

## 2019-03-28 DIAGNOSIS — I10 Essential (primary) hypertension: Secondary | ICD-10-CM | POA: Diagnosis not present

## 2019-03-28 DIAGNOSIS — E785 Hyperlipidemia, unspecified: Secondary | ICD-10-CM | POA: Diagnosis not present

## 2019-03-28 LAB — COMPREHENSIVE METABOLIC PANEL
ALT: 15 U/L (ref 0–35)
AST: 17 U/L (ref 0–37)
Albumin: 4.3 g/dL (ref 3.5–5.2)
Alkaline Phosphatase: 59 U/L (ref 39–117)
BUN: 15 mg/dL (ref 6–23)
CO2: 32 mEq/L (ref 19–32)
Calcium: 9.7 mg/dL (ref 8.4–10.5)
Chloride: 101 mEq/L (ref 96–112)
Creatinine, Ser: 1.07 mg/dL (ref 0.40–1.20)
GFR: 50.6 mL/min — ABNORMAL LOW (ref >60.00)
Glucose, Bld: 118 mg/dL — ABNORMAL HIGH (ref 70–99)
Potassium: 3.8 mEq/L (ref 3.5–5.1)
Sodium: 139 mEq/L (ref 135–145)
Total Bilirubin: 0.5 mg/dL (ref 0.2–1.2)
Total Protein: 7.4 g/dL (ref 6.0–8.3)

## 2019-03-28 LAB — LIPID PANEL
Cholesterol: 179 mg/dL (ref 0–200)
HDL: 60.8 mg/dL (ref >39.00)
LDL Cholesterol: 86 mg/dL (ref 0–99)
NonHDL: 117.7
Total CHOL/HDL Ratio: 3
Triglycerides: 158 mg/dL — ABNORMAL HIGH (ref 0.0–149.0)
VLDL: 31.6 mg/dL (ref 0.0–40.0)

## 2019-03-28 LAB — TSH: TSH: 1.4 u[IU]/mL (ref 0.35–4.50)

## 2019-03-28 LAB — CBC WITH DIFFERENTIAL/PLATELET
Basophils Absolute: 0 10*3/uL (ref 0.0–0.1)
Basophils Relative: 0.7 % (ref 0.0–3.0)
Eosinophils Absolute: 0.2 10*3/uL (ref 0.0–0.7)
Eosinophils Relative: 3 % (ref 0.0–5.0)
HCT: 39.7 % (ref 36.0–46.0)
Hemoglobin: 13.3 g/dL (ref 12.0–15.0)
Lymphocytes Relative: 43.7 % (ref 12.0–46.0)
Lymphs Abs: 2.2 10*3/uL (ref 0.7–4.0)
MCHC: 33.4 g/dL (ref 30.0–36.0)
MCV: 90.4 fl (ref 78.0–100.0)
Monocytes Absolute: 0.4 10*3/uL (ref 0.1–1.0)
Monocytes Relative: 8 % (ref 3.0–12.0)
Neutro Abs: 2.2 10*3/uL (ref 1.4–7.7)
Neutrophils Relative %: 44.6 % (ref 43.0–77.0)
Platelets: 220 10*3/uL (ref 150.0–400.0)
RBC: 4.39 Mil/uL (ref 3.87–5.11)
RDW: 14.2 % (ref 11.5–15.5)
WBC: 5 10*3/uL (ref 4.0–10.5)

## 2019-03-28 LAB — MICROALBUMIN / CREATININE URINE RATIO
Creatinine,U: 152 mg/dL
Microalb Creat Ratio: 0.9 mg/g (ref 0.0–30.0)
Microalb, Ur: 1.3 mg/dL (ref 0.0–1.9)

## 2019-03-28 NOTE — Addendum Note (Signed)
Addended by: Cloyd Stagers on: 03/28/2019 08:47 AM   Modules accepted: Orders

## 2019-03-28 NOTE — Addendum Note (Signed)
Addended by: Cloyd Stagers on: 03/28/2019 08:56 AM   Modules accepted: Orders

## 2019-03-29 LAB — HEMOGLOBIN A1C
Hgb A1c MFr Bld: 6.4 % of total Hgb — ABNORMAL HIGH (ref <5.7)
Mean Plasma Glucose: 137 (calc)
eAG (mmol/L): 7.6 (calc)

## 2019-04-01 ENCOUNTER — Ambulatory Visit (INDEPENDENT_AMBULATORY_CARE_PROVIDER_SITE_OTHER): Payer: Medicare HMO | Admitting: Family Medicine

## 2019-04-01 ENCOUNTER — Encounter: Payer: Self-pay | Admitting: Family Medicine

## 2019-04-01 ENCOUNTER — Other Ambulatory Visit: Payer: Self-pay

## 2019-04-01 VITALS — BP 118/66 | HR 71 | Temp 97.9°F | Ht 64.0 in | Wt 171.2 lb

## 2019-04-01 DIAGNOSIS — E785 Hyperlipidemia, unspecified: Secondary | ICD-10-CM | POA: Diagnosis not present

## 2019-04-01 DIAGNOSIS — Z0000 Encounter for general adult medical examination without abnormal findings: Secondary | ICD-10-CM

## 2019-04-01 DIAGNOSIS — M85859 Other specified disorders of bone density and structure, unspecified thigh: Secondary | ICD-10-CM | POA: Diagnosis not present

## 2019-04-01 DIAGNOSIS — I10 Essential (primary) hypertension: Secondary | ICD-10-CM

## 2019-04-01 DIAGNOSIS — E1169 Type 2 diabetes mellitus with other specified complication: Secondary | ICD-10-CM

## 2019-04-01 DIAGNOSIS — E2839 Other primary ovarian failure: Secondary | ICD-10-CM

## 2019-04-01 DIAGNOSIS — F418 Other specified anxiety disorders: Secondary | ICD-10-CM | POA: Diagnosis not present

## 2019-04-01 DIAGNOSIS — K219 Gastro-esophageal reflux disease without esophagitis: Secondary | ICD-10-CM

## 2019-04-01 DIAGNOSIS — E119 Type 2 diabetes mellitus without complications: Secondary | ICD-10-CM | POA: Diagnosis not present

## 2019-04-01 MED ORDER — TRAZODONE HCL 50 MG PO TABS: ORAL_TABLET | ORAL | 3 refills | Status: DC

## 2019-04-01 MED ORDER — PANTOPRAZOLE SODIUM 40 MG PO TBEC: 40.0000 mg | DELAYED_RELEASE_TABLET | Freq: Two times a day (BID) | ORAL | 3 refills | Status: DC

## 2019-04-01 MED ORDER — METFORMIN HCL ER 500 MG PO TB24: ORAL_TABLET | ORAL | 3 refills | Status: DC

## 2019-04-01 MED ORDER — PRAVASTATIN SODIUM 80 MG PO TABS: 80.0000 mg | ORAL_TABLET | Freq: Every day | ORAL | 3 refills | Status: DC

## 2019-04-01 MED ORDER — FLUOXETINE HCL 40 MG PO CAPS: 40.0000 mg | ORAL_CAPSULE | Freq: Every day | ORAL | 3 refills | Status: DC

## 2019-04-01 MED ORDER — FLUTICASONE PROPIONATE 50 MCG/ACT NA SUSP: 2.0000 | Freq: Every day | NASAL | 3 refills | Status: DC

## 2019-04-01 MED ORDER — HYDROCHLOROTHIAZIDE 12.5 MG PO CAPS: 12.5000 mg | ORAL_CAPSULE | Freq: Every day | ORAL | 3 refills | Status: DC

## 2019-04-01 NOTE — Progress Notes (Signed)
Subjective:    Patient ID: Connie Rowe, female    DOB: 26-May-1950, 69 y.o.   MRN: [id number]  This visit occurred during the SARS-CoV-2 public health emergency.  Safety protocols were in place, including screening questions prior to the visit, additional usage of staff PPE, and extensive cleaning of exam room while observing appropriate contact time as indicated for disinfecting solutions.    HPI Here for amw and health mt visit with rev of chronic medical problems   I have personally reviewed the Medicare Annual Wellness questionnaire and have noted 1. The patient's medical and social history 2. Their use of alcohol, tobacco or illicit drugs 3. Their current medications and supplements 4. The patient's functional ability including ADL's, fall risks, home safety risks and hearing or visual             impairment. 5. Diet and physical activities 6. Evidence for depression or mood disorders  The patients weight, height, BMI have been recorded in the chart and visual acuity is per eye clinic.  I have made referrals, counseling and provided education to the patient based review of the above and I have provided the pt with a written personalized care plan for preventive services. Reviewed and updated provider list, see scanned forms.  See scanned forms.  Routine anticipatory guidance given to patient.  See health maintenance. Colon cancer screening  Colonoscopy 1/17  Breast cancer screening  Mammogram 1/20- plans to schedule at [facility] Self breast exam-no lumps  Flu vaccine 8/20  She had her covid vaccines  Tetanus vaccine - postponed for financial reasons Pneumovax-completed Zoster vaccine   zostavax 8/15 Dexa 1/18  Mild osteopenia FN --interested in getting another one  Falls- none Fractures-none  Supplements takes ca and D  Exercise - walking   Advance directive-not done/ given materials to work on  Cognitive function addressed- see scanned forms- and if abnormal then  additional documentation follows.  No memory or cognitive problems at all   PMH and SH reviewed  Meds, vitals, and allergies reviewed.   Feeling good in general   ROS: See HPI.  Otherwise negative.    Weight : Wt Readings from Last 3 Encounters:  04/01/19 171 lb 3 oz (77.7 kg)  10/30/18 166 lb (75.3 kg)  10/14/18 166 lb (75.3 kg)  walking for exercise  Diet is not great -she is hungry all the time and it is difficult  Perhaps an emotional eater - ? Real hunger  29.38 kg/m   Eats a lot of sweets    Hearing/vision:  Hearing Screening   125Hz  250Hz  500Hz  1000Hz  2000Hz  3000Hz  4000Hz  6000Hz  8000Hz   Right ear:   40 40 40  40    Left ear:   40 40 40  40    Vision Screening Comments: Pt had eye exam in Nov 2020 at [facility]    [facility]- has cpap machine  Still using and it helps a lot    Hypertension  bp is stable today  No cp or palpitations or headaches or edema  No side effects to medicines  BP Readings from Last 3 Encounters:  04/01/19 118/66  10/30/18 (!) 124/54  10/14/18 122/76     Pulse Readings from Last 3 Encounters:  04/01/19 71  10/30/18 70  10/14/18 83     DM2 Lab Results  Component Value Date   HGBA1C 6.4 (H) 03/28/2019   this is stable from 6 mo ago Eye exam -fall  On statin  Lab Results  Component Value Date   MICROALBUR 1.3 03/28/2019   MICROALBUR 1.0 03/26/2018  despite sweets - control is good   Hyperlipidemia Lab Results  Component Value Date   CHOL 179 03/28/2019   CHOL 196 09/19/2018   CHOL 177 11/29/2017   Lab Results  Component Value Date   HDL 60.80 03/28/2019   HDL 62.50 09/19/2018   HDL 53.00 11/29/2017   Lab Results  Component Value Date   LDLCALC 86 03/28/2019   LDLCALC 107 (H) 09/19/2018   LDLCALC 86 11/29/2017   Lab Results  Component Value Date   TRIG 158.0 (H) 03/28/2019   TRIG 131.0 09/19/2018   TRIG 189.0 (H) 11/29/2017   Lab Results  Component Value Date   CHOLHDL 3 03/28/2019   CHOLHDL 3 09/19/2018    CHOLHDL 3 11/29/2017   Lab Results  Component Value Date   LDLDIRECT 126.0 06/03/2014   LDLDIRECT 122.4 09/14/2010   LDLDIRECT 132.2 04/16/2008   Taking pravastatin 80 mg  LDL came down  Avoids fried food   Mood with h/o depression and anxiety  On fluoxetine  A tough year but doing fine   Patient Active Problem List   Diagnosis Date Noted  . Atypical chest pain 10/14/2018  . Dysphagia 10/14/2018  . Allergic rhinitis 09/03/2017  . Insomnia 11/27/2016  . Daytime somnolence 05/23/2016  . Snoring 05/23/2016  . Estrogen deficiency 10/19/2015  . Need for hepatitis C screening test 09/27/2015  . S/P right THA, AA 03/30/2015  . Pre-operative examination 02/24/2015  . S/P lumbar laminectomy 09/23/2014  . Essential hypertension 06/26/2014  . Encounter for Medicare annual wellness exam 06/03/2014  . Colon cancer screening 06/03/2014  . Sciatica 11/26/2013  . Controlled type 2 diabetes mellitus without complication, without long-term current use of insulin ([place]) 04/17/2013  . Hirsutism 12/06/2010  . Routine general medical examination at a health care facility 09/10/2010  . Other screening mammogram 09/01/2010  . Osteopenia 10/22/2006  . URINARY INCONTINENCE, STRESS 08/22/2006  . History of Helicobacter pylori infection 04/18/2006  . Hyperlipidemia associated with type 2 diabetes mellitus ([place]) 04/18/2006  . Depression with anxiety 04/18/2006  . EXTERNAL HEMORRHOIDS 04/18/2006  . GERD 04/18/2006  . HIATAL HERNIA 04/18/2006  . OVERACTIVE BLADDER 04/18/2006  . HEMATURIA, MICROSCOPIC 04/18/2006  . PANCREATITIS, HX OF 04/18/2006  . DIVERTICULOSIS, COLON 01/02/2000   Past Medical History:  Diagnosis Date  . Arthritis   . Colon polyps   . Depression   . Esophageal stricture   . Falls   . GERD (gastroesophageal reflux disease)   . H/O fracture of humerus 2006  . History of bronchitis   . History of hiatal hernia   . Hyperlipidemia   . Hypertension   . Nocturia   .  Osteopenia   . Pancreatitis    History of   Past Surgical History:  Procedure Laterality Date  . BLADDER REPAIR    . COLONOSCOPY    . DIAGNOSTIC LAPAROSCOPY     tubal ligation  . DILATION AND CURETTAGE OF UTERUS    . ESOPHAGEAL DILATION    . LUMBAR LAMINECTOMY/DECOMPRESSION MICRODISCECTOMY Right 09/23/2014   Procedure: Laminectomy and Foraminotomy - Lumbar three- lumbar four - right ;  Surgeon: Eustace Moore, MD;  Location: [facility];  Service: Neurosurgery;  Laterality: Right;  . MULTIPLE TOOTH EXTRACTIONS    . TONSILLECTOMY    . TOTAL HIP ARTHROPLASTY Right 03/30/2015   Procedure: RIGHT TOTAL HIP ARTHROPLASTY ANTERIOR APPROACH;  Surgeon: Paralee Cancel, MD;  Location: [facility];  Service: Orthopedics;  Laterality: Right;  . TOTAL HIP ARTHROPLASTY Left 01/09/2017   Procedure: LEFT TOTAL HIP ARTHROPLASTY ANTERIOR APPROACH;  Surgeon: Paralee Cancel, MD;  Location: [facility];  Service: Orthopedics;  Laterality: Left;  70 mins  . TUBAL LIGATION    . UPPER GI ENDOSCOPY     Social History   Tobacco Use  . Smoking status: Never Smoker  . Smokeless tobacco: Never Used  Substance Use Topics  . Alcohol use: No    Alcohol/week: 0.0 standard drinks  . Drug use: No   Family History  Problem Relation Age of Onset  . Stroke Mother   . Hypertension Mother   . Colon cancer Mother   . Drug abuse Son   . Stroke Brother   . Colon polyps Neg Hx   . Crohn's disease Neg Hx   . Pancreatic cancer Neg Hx   . Rectal cancer Neg Hx   . Stomach cancer Neg Hx   . Ulcerative colitis Neg Hx   . Breast cancer Neg Hx    Allergies  Allergen Reactions  . Ace Inhibitors Cough  . Ambien [Zolpidem Tartrate] Other (See Comments)    Affected memory   Current Outpatient Medications on File Prior to Visit  Medication Sig Dispense Refill  . Calcium Carbonate-Vitamin D (CALCIUM-VITAMIN D) 500-200 MG-UNIT per tablet Take 1 tablet by mouth daily.      Marland Kitchen docusate sodium (COLACE) 100 MG capsule Take 1 capsule (100  mg total) by mouth 2 (two) times daily. 10 capsule 0  . famotidine (PEPCID) 40 MG tablet Take 1 tablet (40 mg total) by mouth daily. (Patient taking differently: Take 40 mg by mouth daily as needed. ) 60 tablet 11  . Multiple Vitamins-Minerals (MULTIVITAMIN PO) Take 1 tablet by mouth daily.    . Tetrahydrozoline HCl (VISINE OP) Place 1 drop into both eyes as needed (for dry eyes).     No current facility-administered medications on file prior to visit.    Review of Systems  Constitutional: Negative for activity change, appetite change, fatigue, fever and unexpected weight change.  HENT: Negative for congestion, ear pain, rhinorrhea, sinus pressure and sore throat.   Eyes: Negative for pain, redness and visual disturbance.  Respiratory: Negative for cough, shortness of breath and wheezing.   Cardiovascular: Negative for chest pain and palpitations.  Gastrointestinal: Negative for abdominal pain, blood in stool, constipation and diarrhea.  Endocrine: Negative for polydipsia and polyuria.  Genitourinary: Negative for dysuria, frequency and urgency.  Musculoskeletal: Negative for arthralgias, back pain and myalgias.  Skin: Negative for pallor and rash.  Allergic/Immunologic: Negative for environmental allergies.  Neurological: Negative for dizziness, syncope and headaches.  Hematological: Negative for adenopathy. Does not bruise/bleed easily.  Psychiatric/Behavioral: Negative for decreased concentration and dysphoric mood. The patient is not nervous/anxious.        Stressors  Coping well        Objective:   Physical Exam Constitutional:      General: She is not in acute distress.    Appearance: Normal appearance. She is well-developed. She is obese. She is not ill-appearing or diaphoretic.  HENT:     Head: Normocephalic and atraumatic.     Right Ear: Tympanic membrane, ear canal and external ear normal.     Left Ear: Tympanic membrane, ear canal and external ear normal.     Nose: Nose  normal. No congestion.     Mouth/Throat:     Mouth: Mucous membranes are moist.  Pharynx: Oropharynx is clear. No posterior oropharyngeal erythema.  Eyes:     General: No scleral icterus.    Extraocular Movements: Extraocular movements intact.     Conjunctiva/sclera: Conjunctivae normal.     Pupils: Pupils are equal, round, and reactive to light.  Neck:     Thyroid: No thyromegaly.     Vascular: No carotid bruit or JVD.  Cardiovascular:     Rate and Rhythm: Normal rate and regular rhythm.     Pulses: Normal pulses.     Heart sounds: Normal heart sounds. No gallop.   Pulmonary:     Effort: Pulmonary effort is normal. No respiratory distress.     Breath sounds: Normal breath sounds. No wheezing.     Comments: Good air exch Chest:     Chest wall: No tenderness.  Abdominal:     General: Bowel sounds are normal. There is no distension or abdominal bruit.     Palpations: Abdomen is soft. There is no mass.     Tenderness: There is no abdominal tenderness.     Hernia: No hernia is present.  Genitourinary:    Comments: Breast exam: No mass, nodules, thickening, tenderness, bulging, retraction, inflamation, nipple discharge or skin changes noted.  No axillary or clavicular LA.     Musculoskeletal:        General: No tenderness. Normal range of motion.     Cervical back: Normal range of motion and neck supple. No rigidity. No muscular tenderness.     Right lower leg: No edema.     Left lower leg: No edema.     Comments: No kyphosis   Lymphadenopathy:     Cervical: No cervical adenopathy.  Skin:    General: Skin is warm and dry.     Coloration: Skin is not pale.     Findings: No erythema or rash.     Comments: Solar lentigines diffusely Some sks  Neurological:     Mental Status: She is alert. Mental status is at baseline.     Cranial Nerves: No cranial nerve deficit.     Motor: No abnormal muscle tone.     Coordination: Coordination normal.     Gait: Gait normal.     Deep  Tendon Reflexes: Reflexes are normal and symmetric. Reflexes normal.  Psychiatric:        Mood and Affect: Mood normal.        Cognition and Memory: Cognition and memory normal.           Assessment & Plan:   Problem List Items Addressed This Visit      Cardiovascular and Mediastinum   Essential hypertension    bp in fair control at this time  BP Readings from Last 1 Encounters:  04/01/19 118/66   No changes needed Most recent labs reviewed  Disc lifstyle change with low sodium diet and exercise        Relevant Medications   hydrochlorothiazide (MICROZIDE) 12.5 MG capsule   pravastatin (PRAVACHOL) 80 MG tablet     Digestive   GERD    Controlled with protonix 40 mg       Relevant Medications   pantoprazole (PROTONIX) 40 MG tablet     Endocrine   Hyperlipidemia associated with type 2 diabetes mellitus ([place])    Disc goals for lipids and reasons to control them Rev last labs with pt Rev low sat fat diet in detail  Well controlled with pravastatin 80 mg  LDL is down  Relevant Medications   metFORMIN (GLUCOPHAGE-XR) 500 MG 24 hr tablet   pravastatin (PRAVACHOL) 80 MG tablet   Controlled type 2 diabetes mellitus without complication, without long-term current use of insulin ([facility])    Lab Results  Component Value Date   HGBA1C 6.4 (H) 03/28/2019   This is well controlled with metformin  Utd eye exam in the fall  Taking a statin  Nl microalb F/u 6 mo  Enc healthy diet /exercise and wt loss       Relevant Medications   metFORMIN (GLUCOPHAGE-XR) 500 MG 24 hr tablet   pravastatin (PRAVACHOL) 80 MG tablet     Musculoskeletal and Integument   Osteopenia    dexa 1/18  Due for 2 y recall  No falls or fx Enc to keep walking and continue ca and D Ref done for dexa-pt will make the appt        Other   Depression with anxiety    Doing well with fluoxetine despite stressors      Relevant Medications   FLUoxetine (PROZAC) 40 MG capsule   traZODone  (DESYREL) 50 MG tablet   Routine general medical examination at a health care facility    Reviewed health habits including diet and exercise and skin cancer prevention Reviewed appropriate screening tests for age  Also reviewed health mt list, fam hx and immunization status , as well as social and family history   See HPI Labs reviewed  inst pt to schedule her mammogram and dexa (referrals done)  No falls or fractures Given materials to work on advance directive  No cognitive concerns Nl hearing screen  utd eye and vision exam  Using cpap for [facility]-helping        Encounter for Medicare annual wellness exam - Primary    Reviewed health habits including diet and exercise and skin cancer prevention Reviewed appropriate screening tests for age  Also reviewed health mt list, fam hx and immunization status , as well as social and family history   See HPI Labs reviewed  inst pt to schedule her mammogram and dexa (referrals done)  No falls or fractures Given materials to work on advance directive  No cognitive concerns Nl hearing screen  utd eye and vision exam  Using cpap for [facility]-helping        Estrogen deficiency    dexa ordered      Relevant Orders   DG Bone Density

## 2019-04-01 NOTE — Assessment & Plan Note (Signed)
Reviewed health habits including diet and exercise and skin cancer prevention Reviewed appropriate screening tests for age  Also reviewed health mt list, fam hx and immunization status , as well as social and family history   See HPI Labs reviewed  inst pt to schedule her mammogram and dexa (referrals done)  No falls or fractures Given materials to work on advance directive  No cognitive concerns Nl hearing screen  utd eye and vision exam  Using cpap for [facility]-helping

## 2019-04-01 NOTE — Assessment & Plan Note (Signed)
dexa ordered

## 2019-04-01 NOTE — Assessment & Plan Note (Signed)
dexa 1/18  Due for 2 y recall  No falls or fx Enc to keep walking and continue ca and D Ref done for dexa-pt will make the appt

## 2019-04-01 NOTE — Patient Instructions (Addendum)
Please call and make your mammogram appt. And bone density appointment   If you are interested in the new shingles vaccine (Shingrix) - call your local pharmacy to check on coverage and availability  If affordable, get on a wait list at your pharmacy to get the vaccine.  Please work on your advance directive - the blue packet  A good program for weight loss/health - NOOM   Try to get most of your carbohydrates from produce (with the exception of white potatoes)  Eat less bread/pasta/rice/snack foods/cereals/sweets and other items from the middle of the grocery store (processed carbs)

## 2019-04-01 NOTE — Assessment & Plan Note (Signed)
Lab Results  Component Value Date   HGBA1C 6.4 (H) 03/28/2019   This is well controlled with metformin  Utd eye exam in the fall  Taking a statin  Nl microalb F/u 6 mo  Enc healthy diet /exercise and wt loss

## 2019-04-01 NOTE — Assessment & Plan Note (Signed)
Disc goals for lipids and reasons to control them Rev last labs with pt Rev low sat fat diet in detail  Well controlled with pravastatin 80 mg  LDL is down

## 2019-04-01 NOTE — Assessment & Plan Note (Signed)
Controlled with protonix 40 mg

## 2019-04-01 NOTE — Assessment & Plan Note (Signed)
Doing well with fluoxetine despite stressors

## 2019-04-01 NOTE — Assessment & Plan Note (Signed)
bp in fair control at this time  BP Readings from Last 1 Encounters:  04/01/19 118/66   No changes needed Most recent labs reviewed  Disc lifstyle change with low sodium diet and exercise

## 2019-04-16 ENCOUNTER — Other Ambulatory Visit: Payer: Self-pay | Admitting: Family Medicine

## 2019-04-16 DIAGNOSIS — Z1231 Encounter for screening mammogram for malignant neoplasm of breast: Secondary | ICD-10-CM

## 2019-04-21 IMAGING — DX DG PORTABLE PELVIS
1 series · 1 of 1 positions shown · non-contrast
Comparison: AP pelvis of March 30, 2015

CLINICAL DATA: Status post left total hip joint prosthesis
placement. There is a pre-existing right hip joint prosthesis.

EXAM:
PORTABLE PELVIS 1-2 VIEWS

[pelvis ap]
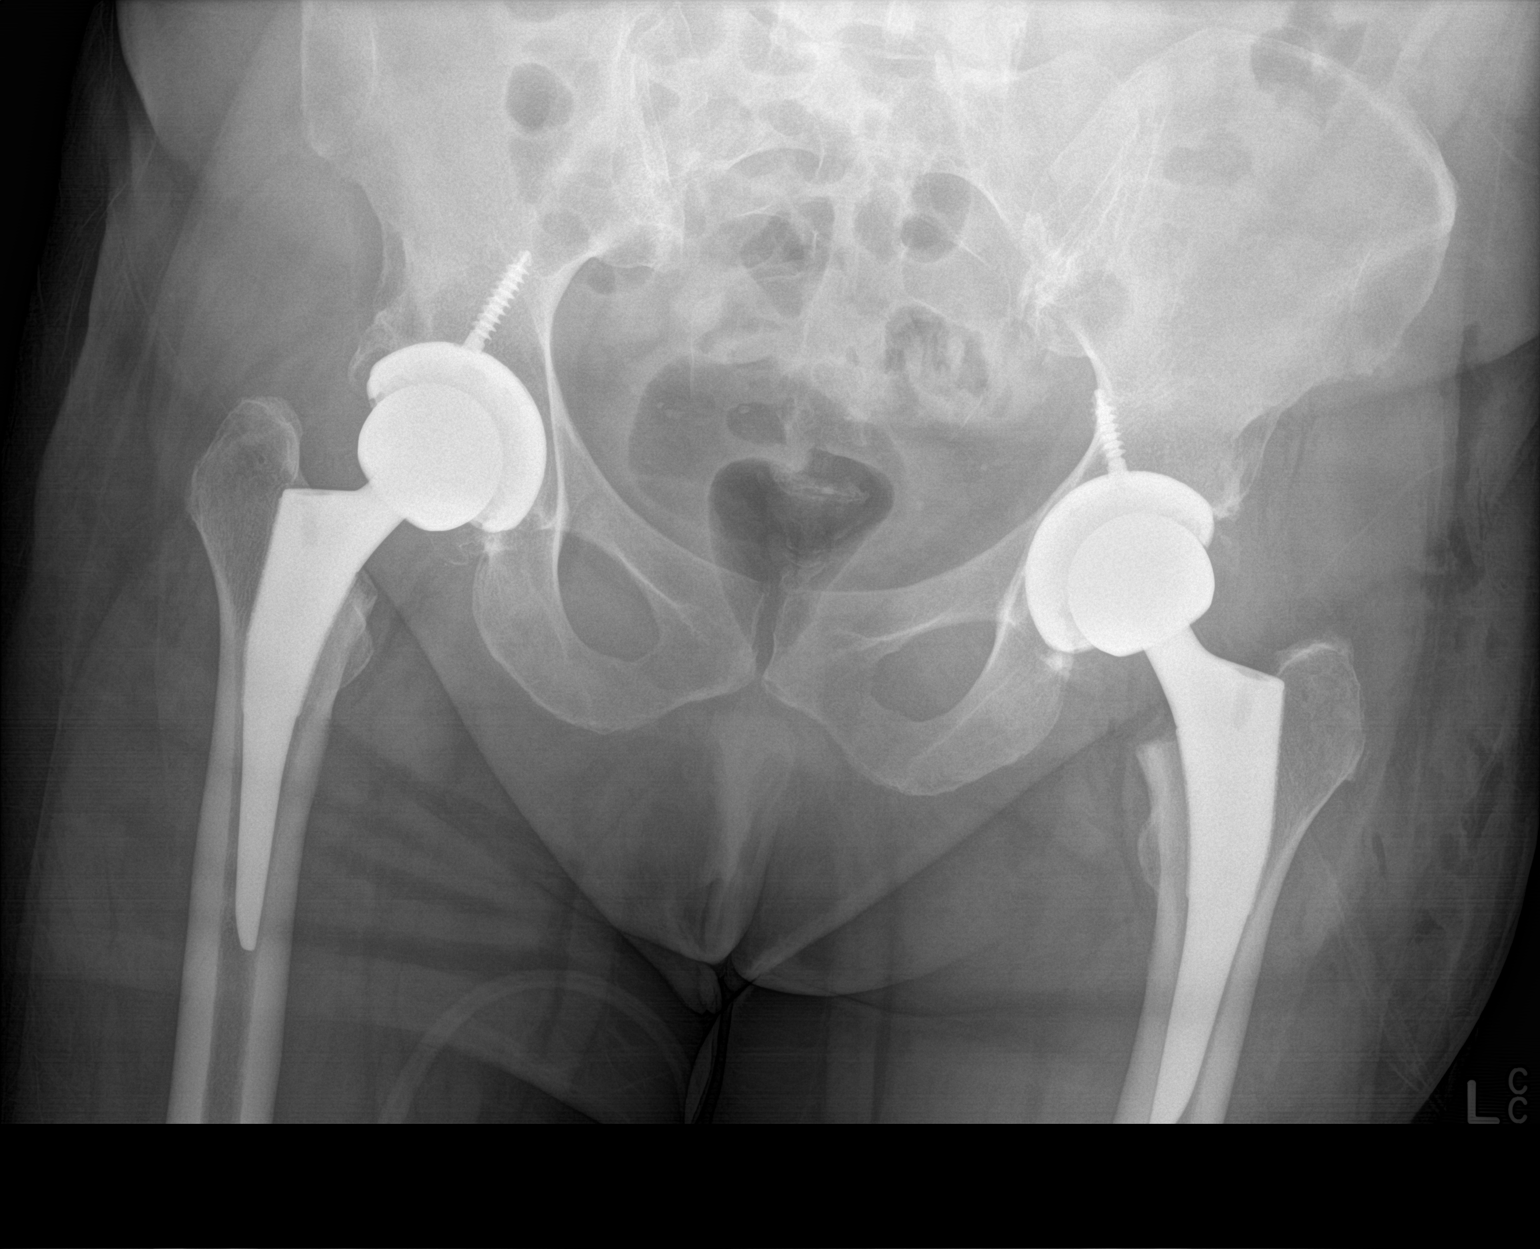

[1 of 1 positions shown; findings below may reference images not displayed]

FINDINGS: The patient has undergone placement of left hip joint prosthesis.
Radiographic positioning of the prosthetic components is good. The
interface with the native bone is normal.
IMPRESSION: There is no immediate postprocedure complication following left hip
joint prosthesis placement.

## 2019-05-07 ENCOUNTER — Ambulatory Visit
Admission: RE | Admit: 2019-05-07 | Discharge: 2019-05-07 | Disposition: A | Discharge status: Home / Self Care | Payer: Medicare HMO | Source: Ambulatory Visit | Attending: Family Medicine | Admitting: Family Medicine

## 2019-05-07 DIAGNOSIS — Z1231 Encounter for screening mammogram for malignant neoplasm of breast: Secondary | ICD-10-CM | POA: Diagnosis not present

## 2019-05-14 ENCOUNTER — Ambulatory Visit
Admission: RE | Admit: 2019-05-14 | Discharge: 2019-05-14 | Disposition: A | Discharge status: Home / Self Care | Payer: Medicare HMO | Source: Ambulatory Visit | Attending: Family Medicine | Admitting: Family Medicine

## 2019-05-14 DIAGNOSIS — Z780 Asymptomatic menopausal state: Secondary | ICD-10-CM | POA: Diagnosis not present

## 2019-05-14 DIAGNOSIS — E2839 Other primary ovarian failure: Secondary | ICD-10-CM | POA: Diagnosis not present

## 2019-05-14 DIAGNOSIS — Z13820 Encounter for screening for osteoporosis: Secondary | ICD-10-CM | POA: Diagnosis not present

## 2019-06-03 ENCOUNTER — Encounter: Payer: Self-pay | Admitting: Family Medicine

## 2019-06-03 ENCOUNTER — Other Ambulatory Visit: Payer: Self-pay

## 2019-06-03 ENCOUNTER — Ambulatory Visit (INDEPENDENT_AMBULATORY_CARE_PROVIDER_SITE_OTHER): Payer: Medicare HMO | Admitting: Family Medicine

## 2019-06-03 VITALS — BP 118/76 | HR 76 | Temp 96.7°F | Ht 64.0 in | Wt 173.1 lb

## 2019-06-03 DIAGNOSIS — E119 Type 2 diabetes mellitus without complications: Secondary | ICD-10-CM | POA: Diagnosis not present

## 2019-06-03 DIAGNOSIS — J0110 Acute frontal sinusitis, unspecified: Secondary | ICD-10-CM | POA: Diagnosis not present

## 2019-06-03 DIAGNOSIS — I10 Essential (primary) hypertension: Secondary | ICD-10-CM

## 2019-06-03 MED ORDER — HYDROCODONE-HOMATROPINE 5-1.5 MG/5ML PO SYRP: 5.0000 mL | ORAL_SOLUTION | Freq: Three times a day (TID) | ORAL | 0 refills | Status: DC | PRN

## 2019-06-03 MED ORDER — AMOXICILLIN-POT CLAVULANATE 875-125 MG PO TABS: 1.0000 | ORAL_TABLET | Freq: Two times a day (BID) | ORAL | 0 refills | Status: DC

## 2019-06-03 NOTE — Patient Instructions (Addendum)
Take augmentin for the sinus infection  Drink fluids Continue flonase if helpful  The hycodan will help cough- it may sedate so use with caution  If not improving please get tested for covid and isolate until feeling better (regardless) Update if not starting to improve in a week or if worsening

## 2019-06-03 NOTE — Assessment & Plan Note (Signed)
Remains stable  Would not recommend prednisone for sinusitis unless it was abs necessary due to risk of elevated glucose

## 2019-06-03 NOTE — Progress Notes (Signed)
Subjective:    Patient ID: Connie Rowe, female    DOB: [birth year], 69 y.o.   MRN: [id number]  This visit occurred during the SARS-CoV-2 public health emergency.  Safety protocols were in place, including screening questions prior to the visit, additional usage of staff PPE, and extensive cleaning of exam room while observing appropriate contact time as indicated for disinfecting solutions.   Note: pt got past the screeners and did not mention cough/congestion until she got in the room  She was taken outside by the back door and visit accomplished outside She has been fully vaccinated for covid   HPI Pt presents for 2 month f/u of chronic health problems  Also cough and headache   Wt Readings from Last 3 Encounters:  06/03/19 173 lb 2 oz (78.5 kg)  04/01/19 171 lb 3 oz (77.7 kg)  10/30/18 166 lb (75.3 kg)   29.72 kg/m   DM 2 mild  Lab Results  Component Value Date   HGBA1C 6.4 (H) 03/28/2019   Taking metformin xr 500 mg once daily  No changes in blood sugar control     bp is stable today  No cp or palpitations or headaches or edema  No side effects to medicines  BP Readings from Last 3 Encounters:  06/03/19 118/76  04/01/19 118/66  10/30/18 (!) 124/54     Headache Teeth hurt  Cannot get anything out of her nose  Cough-non productive- dry and painful   Using flonase Benadryl at night  Sneezing- some/not a whole lot  This is bad allergy season   Tickle in chest - not wheezing  Has taken delsym    3-4 weeks  Head pressure is above her eyes   Not using saline   Has not been around anyone with covid  Taste is decreased but not gone  Smell is ok   No GI symptoms    Patient Active Problem List   Diagnosis Date Noted  . Atypical chest pain 10/14/2018  . Dysphagia 10/14/2018  . Acute sinusitis 01/08/2018  . Allergic rhinitis 09/03/2017  . Insomnia 11/27/2016  . Daytime somnolence 05/23/2016  . Snoring 05/23/2016  . Estrogen deficiency 10/19/2015    . Need for hepatitis C screening test 09/27/2015  . S/P right THA, AA 03/30/2015  . Pre-operative examination 02/24/2015  . S/P lumbar laminectomy 09/23/2014  . Essential hypertension 06/26/2014  . Encounter for Medicare annual wellness exam 06/03/2014  . Colon cancer screening 06/03/2014  . Sciatica 11/26/2013  . Controlled type 2 diabetes mellitus without complication, without long-term current use of insulin ([place]) 04/17/2013  . Hirsutism 12/06/2010  . Routine general medical examination at a health care facility 09/10/2010  . Other screening mammogram 09/01/2010  . Osteopenia 10/22/2006  . URINARY INCONTINENCE, STRESS 08/22/2006  . History of Helicobacter pylori infection 04/18/2006  . Hyperlipidemia associated with type 2 diabetes mellitus ([place]) 04/18/2006  . Depression with anxiety 04/18/2006  . EXTERNAL HEMORRHOIDS 04/18/2006  . GERD 04/18/2006  . HIATAL HERNIA 04/18/2006  . OVERACTIVE BLADDER 04/18/2006  . HEMATURIA, MICROSCOPIC 04/18/2006  . PANCREATITIS, HX OF 04/18/2006  . DIVERTICULOSIS, COLON 01/02/2000   Past Medical History:  Diagnosis Date  . Arthritis   . Colon polyps   . Depression   . Esophageal stricture   . Falls   . GERD (gastroesophageal reflux disease)   . H/O fracture of humerus 2006  . History of bronchitis   . History of hiatal hernia   . Hyperlipidemia   .  Hypertension   . Nocturia   . Osteopenia   . Pancreatitis    History of   Past Surgical History:  Procedure Laterality Date  . BLADDER REPAIR    . COLONOSCOPY    . DIAGNOSTIC LAPAROSCOPY     tubal ligation  . DILATION AND CURETTAGE OF UTERUS    . ESOPHAGEAL DILATION    . LUMBAR LAMINECTOMY/DECOMPRESSION MICRODISCECTOMY Right 09/23/2014   Procedure: Laminectomy and Foraminotomy - Lumbar three- lumbar four - right ;  Surgeon: Eustace Moore, MD;  Location: [facility];  Service: Neurosurgery;  Laterality: Right;  . MULTIPLE TOOTH EXTRACTIONS    . TONSILLECTOMY    . TOTAL HIP  ARTHROPLASTY Right 03/30/2015   Procedure: RIGHT TOTAL HIP ARTHROPLASTY ANTERIOR APPROACH;  Surgeon: Paralee Cancel, MD;  Location: [facility];  Service: Orthopedics;  Laterality: Right;  . TOTAL HIP ARTHROPLASTY Left 01/09/2017   Procedure: LEFT TOTAL HIP ARTHROPLASTY ANTERIOR APPROACH;  Surgeon: Paralee Cancel, MD;  Location: [facility];  Service: Orthopedics;  Laterality: Left;  70 mins  . TUBAL LIGATION    . UPPER GI ENDOSCOPY     Social History   Tobacco Use  . Smoking status: Never Smoker  . Smokeless tobacco: Never Used  Substance Use Topics  . Alcohol use: No    Alcohol/week: 0.0 standard drinks  . Drug use: No   Family History  Problem Relation Age of Onset  . Stroke Mother   . Hypertension Mother   . Colon cancer Mother   . Drug abuse Son   . Stroke Brother   . Colon polyps Neg Hx   . Crohn's disease Neg Hx   . Pancreatic cancer Neg Hx   . Rectal cancer Neg Hx   . Stomach cancer Neg Hx   . Ulcerative colitis Neg Hx   . Breast cancer Neg Hx    Allergies  Allergen Reactions  . Ace Inhibitors Cough  . Ambien [Zolpidem Tartrate] Other (See Comments)    Affected memory   Current Outpatient Medications on File Prior to Visit  Medication Sig Dispense Refill  . Calcium Carbonate-Vitamin D (CALCIUM-VITAMIN D) 500-200 MG-UNIT per tablet Take 1 tablet by mouth daily.      Marland Kitchen docusate sodium (COLACE) 100 MG capsule Take 1 capsule (100 mg total) by mouth 2 (two) times daily. 10 capsule 0  . famotidine (PEPCID) 40 MG tablet Take 1 tablet (40 mg total) by mouth daily. (Patient taking differently: Take 40 mg by mouth daily as needed. ) 60 tablet 11  . FLUoxetine (PROZAC) 40 MG capsule Take 1 capsule (40 mg total) by mouth daily. 90 capsule 3  . fluticasone (FLONASE) 50 MCG/ACT nasal spray Place 2 sprays into both nostrils daily. 48 g 3  . hydrochlorothiazide (MICROZIDE) 12.5 MG capsule Take 1 capsule (12.5 mg total) by mouth daily. 90 capsule 3  . metFORMIN (GLUCOPHAGE-XR) 500 MG 24 hr  tablet TAKE 1 TABLET BY MOUTH ONCE A DAY WITH BREAKFAST. 90 tablet 3  . Multiple Vitamins-Minerals (MULTIVITAMIN PO) Take 1 tablet by mouth daily.    . pantoprazole (PROTONIX) 40 MG tablet Take 1 tablet (40 mg total) by mouth 2 (two) times daily. 180 tablet 3  . pravastatin (PRAVACHOL) 80 MG tablet Take 1 tablet (80 mg total) by mouth daily. 90 tablet 3  . Tetrahydrozoline HCl (VISINE OP) Place 1 drop into both eyes as needed (for dry eyes).    . traZODone (DESYREL) 50 MG tablet TAKE 1/2 TO 1 TABLET AT BEDTIME  AS NEEDED FOR SLEEP 90 tablet 3   No current facility-administered medications on file prior to visit.    Review of Systems  Constitutional: Negative for activity change, appetite change, fatigue, fever and unexpected weight change.  HENT: Positive for congestion, ear pain, postnasal drip, rhinorrhea, sinus pressure, sinus pain and sneezing. Negative for facial swelling, sore throat and voice change.   Eyes: Negative for pain, redness and visual disturbance.  Respiratory: Positive for cough. Negative for chest tightness, shortness of breath, wheezing and stridor.   Cardiovascular: Negative for chest pain and palpitations.  Gastrointestinal: Negative for abdominal pain, blood in stool, constipation and diarrhea.  Endocrine: Negative for polydipsia and polyuria.  Genitourinary: Negative for dysuria, frequency and urgency.  Musculoskeletal: Negative for arthralgias, back pain and myalgias.  Skin: Negative for pallor and rash.  Allergic/Immunologic: Negative for environmental allergies.  Neurological: Negative for dizziness, syncope and headaches.  Hematological: Negative for adenopathy. Does not bruise/bleed easily.  Psychiatric/Behavioral: Negative for decreased concentration and dysphoric mood. The patient is not nervous/anxious.        Objective:   Physical Exam Constitutional:      General: She is not in acute distress.    Appearance: She is well-developed. She is not  ill-appearing.  HENT:     Head: Normocephalic and atraumatic.     Comments: Tender over frontal sinuses    Nose: Congestion present.     Mouth/Throat:     Mouth: Mucous membranes are moist.  Eyes:     General:        Right eye: No discharge.        Left eye: No discharge.     Conjunctiva/sclera: Conjunctivae normal.     Pupils: Pupils are equal, round, and reactive to light.  Cardiovascular:     Rate and Rhythm: Normal rate and regular rhythm.  Pulmonary:     Effort: Pulmonary effort is normal. No respiratory distress.     Breath sounds: Normal breath sounds. No stridor. No wheezing, rhonchi or rales.     Comments: No wheeze even on forced exp Does not cough Musculoskeletal:     Cervical back: Normal range of motion.  Lymphadenopathy:     Cervical: No cervical adenopathy.  Skin:    General: Skin is warm and dry.     Coloration: Skin is not pale.     Findings: No erythema or rash.  Neurological:     Mental Status: She is alert.     Cranial Nerves: No cranial nerve deficit.  Psychiatric:        Mood and Affect: Mood normal.           Assessment & Plan:   Problem List Items Addressed This Visit      Cardiovascular and Mediastinum   Essential hypertension    Stable and well controlled        Respiratory   Acute sinusitis - Primary    From allergic congestion tx with augmentin  Hycodan for cough prn -caution of sedation  Allergen avoidance flonase daily  Update if not starting to improve in a week or if worsening   Consider covid testing if not improving or worse or new symptoms as well  Isolate until feeling better      Relevant Medications   amoxicillin-clavulanate (AUGMENTIN) 875-125 MG tablet   HYDROcodone-homatropine (HYCODAN) 5-1.5 MG/5ML syrup     Endocrine   Controlled type 2 diabetes mellitus without complication, without long-term current use of insulin ([place])    Remains  stable  Would not recommend prednisone for sinusitis unless it was abs  necessary due to risk of elevated glucose

## 2019-06-03 NOTE — Assessment & Plan Note (Signed)
From allergic congestion tx with augmentin  Hycodan for cough prn -caution of sedation  Allergen avoidance flonase daily  Update if not starting to improve in a week or if worsening   Consider covid testing if not improving or worse or new symptoms as well  Isolate until feeling better

## 2019-06-03 NOTE — Assessment & Plan Note (Signed)
Stable and well controlled. 

## 2019-06-23 ENCOUNTER — Telehealth: Payer: Self-pay

## 2019-06-23 MED ORDER — PREDNISONE 10 MG PO TABS
ORAL_TABLET | ORAL | 0 refills | Status: DC
Start: 2019-06-23 — End: 2019-08-18

## 2019-06-23 NOTE — Addendum Note (Signed)
Addended by: Loura Pardon A on: 06/23/2019 04:49 PM   Modules accepted: Orders

## 2019-06-23 NOTE — Telephone Encounter (Signed)
Pt notified Rx sent to pharmacy and advise of Dr. Tower's comments  

## 2019-06-23 NOTE — Telephone Encounter (Signed)
I sent prednisone to [facility]  It may cause side effects -jitteriness /hunger   Also if not improving it may be a good idea to get tested for covid just to be sure

## 2019-06-23 NOTE — Telephone Encounter (Signed)
Patient contacted the office stating that she was seen on 06/03/19 for a sinus infection. She states that she feels that her sx are not improving, and that Dr. Glori Bickers had mentioned that if her sx were not improving, she could possibly send in an rx for prednisone. Patient states she is still having chest congestion, productive cough, itchy/watery eyes and nasal drainage. Please advise.

## 2019-08-18 ENCOUNTER — Encounter: Payer: Self-pay | Admitting: Family Medicine

## 2019-08-18 ENCOUNTER — Other Ambulatory Visit: Payer: Self-pay

## 2019-08-18 ENCOUNTER — Ambulatory Visit (INDEPENDENT_AMBULATORY_CARE_PROVIDER_SITE_OTHER)
Admission: RE | Admit: 2019-08-18 | Discharge: 2019-08-18 | Disposition: A | Discharge status: Home / Self Care | Payer: Medicare HMO | Source: Ambulatory Visit | Attending: Family Medicine | Admitting: Family Medicine

## 2019-08-18 ENCOUNTER — Ambulatory Visit (INDEPENDENT_AMBULATORY_CARE_PROVIDER_SITE_OTHER): Payer: Medicare HMO | Admitting: Family Medicine

## 2019-08-18 VITALS — BP 132/70 | HR 77 | Temp 97.4°F | Ht 64.0 in | Wt 173.1 lb

## 2019-08-18 DIAGNOSIS — R202 Paresthesia of skin: Secondary | ICD-10-CM | POA: Diagnosis not present

## 2019-08-18 DIAGNOSIS — M79601 Pain in right arm: Secondary | ICD-10-CM | POA: Insufficient documentation

## 2019-08-18 DIAGNOSIS — M25511 Pain in right shoulder: Secondary | ICD-10-CM | POA: Diagnosis not present

## 2019-08-18 DIAGNOSIS — M47812 Spondylosis without myelopathy or radiculopathy, cervical region: Secondary | ICD-10-CM | POA: Diagnosis not present

## 2019-08-18 DIAGNOSIS — M19011 Primary osteoarthritis, right shoulder: Secondary | ICD-10-CM | POA: Diagnosis not present

## 2019-08-18 MED ORDER — MELOXICAM 15 MG PO TABS
15.0000 mg | ORAL_TABLET | Freq: Every day | ORAL | 1 refills | Status: DC
Start: 2019-08-18 — End: 2020-05-07

## 2019-08-18 NOTE — Patient Instructions (Addendum)
Ice your shoulder when it bothers you  Make sure your pillow is the right height and support  Also - look for a carpal tunnel wrist splint to wear on R hand when sleeping   xrays today Take the meloxicam with food once daily   We will make a plan from there

## 2019-08-18 NOTE — Assessment & Plan Note (Signed)
And paresthesia with shoulder pain as well  Disc poss locations of nerve impingement/ neck/shoulder/elbow  Xray of cs and shoulder today  meloxicam px Enc to try carpal tunnel splint at night Plan to follow

## 2019-08-18 NOTE — Assessment & Plan Note (Signed)
With arm pain and paresthesias  Fairly good rom  Mildly positive hawking test  Xray today

## 2019-08-18 NOTE — Progress Notes (Signed)
Subjective:    Patient ID: Connie Rowe, female    DOB: [birth year], 68 y.o.   MRN: [id number]  This visit occurred during the SARS-CoV-2 public health emergency.  Safety protocols were in place, including screening questions prior to the visit, additional usage of staff PPE, and extensive cleaning of exam room while observing appropriate contact time as indicated for disinfecting solutions.    HPI Pt presents with R shoulder/arm pain   Wt Readings from Last 3 Encounters:  08/18/19 173 lb 1 oz (78.5 kg)  06/03/19 173 lb 2 oz (78.5 kg)  04/01/19 171 lb 3 oz (77.7 kg)   29.71 kg/m  Pain started in the elbow area- then a funny sensation (ulnar side) going down arm to her hand  Now her R hand goes numb- medial (5,4th fingers) Neck is not bothering her  Then R shoulder started to bothering her Taking tylenol and uses bio freeze  Worse if she stands with her arm hanging down  Tries to keep moving it  Sometimes elbow hurts to bend or lean on it   Taking tylenol   She is R handed as well    Patient Active Problem List   Diagnosis Date Noted  . Right hand paresthesia 08/18/2019  . Right arm pain 08/18/2019  . Right shoulder pain 08/18/2019  . Atypical chest pain 10/14/2018  . Dysphagia 10/14/2018  . Allergic rhinitis 09/03/2017  . Insomnia 11/27/2016  . Daytime somnolence 05/23/2016  . Snoring 05/23/2016  . Estrogen deficiency 10/19/2015  . Need for hepatitis C screening test 09/27/2015  . S/P right THA, AA 03/30/2015  . Pre-operative examination 02/24/2015  . S/P lumbar laminectomy 09/23/2014  . Essential hypertension 06/26/2014  . Encounter for Medicare annual wellness exam 06/03/2014  . Colon cancer screening 06/03/2014  . Sciatica 11/26/2013  . Controlled type 2 diabetes mellitus without complication, without long-term current use of insulin ([place]) 04/17/2013  . Hirsutism 12/06/2010  . Routine general medical examination at a health care facility 09/10/2010  .  Other screening mammogram 09/01/2010  . Osteopenia 10/22/2006  . URINARY INCONTINENCE, STRESS 08/22/2006  . History of Helicobacter pylori infection 04/18/2006  . Hyperlipidemia associated with type 2 diabetes mellitus ([place]) 04/18/2006  . Depression with anxiety 04/18/2006  . EXTERNAL HEMORRHOIDS 04/18/2006  . GERD 04/18/2006  . HIATAL HERNIA 04/18/2006  . OVERACTIVE BLADDER 04/18/2006  . HEMATURIA, MICROSCOPIC 04/18/2006  . PANCREATITIS, HX OF 04/18/2006  . DIVERTICULOSIS, COLON 01/02/2000   Past Medical History:  Diagnosis Date  . Arthritis   . Colon polyps   . Depression   . Esophageal stricture   . Falls   . GERD (gastroesophageal reflux disease)   . H/O fracture of humerus 2006  . History of bronchitis   . History of hiatal hernia   . Hyperlipidemia   . Hypertension   . Nocturia   . Osteopenia   . Pancreatitis    History of   Past Surgical History:  Procedure Laterality Date  . BLADDER REPAIR    . COLONOSCOPY    . DIAGNOSTIC LAPAROSCOPY     tubal ligation  . DILATION AND CURETTAGE OF UTERUS    . ESOPHAGEAL DILATION    . LUMBAR LAMINECTOMY/DECOMPRESSION MICRODISCECTOMY Right 09/23/2014   Procedure: Laminectomy and Foraminotomy - Lumbar three- lumbar four - right ;  Surgeon: Eustace Moore, MD;  Location: [facility];  Service: Neurosurgery;  Laterality: Right;  . MULTIPLE TOOTH EXTRACTIONS    . TONSILLECTOMY    .  TOTAL HIP ARTHROPLASTY Right 03/30/2015   Procedure: RIGHT TOTAL HIP ARTHROPLASTY ANTERIOR APPROACH;  Surgeon: Paralee Cancel, MD;  Location: [facility];  Service: Orthopedics;  Laterality: Right;  . TOTAL HIP ARTHROPLASTY Left 01/09/2017   Procedure: LEFT TOTAL HIP ARTHROPLASTY ANTERIOR APPROACH;  Surgeon: Paralee Cancel, MD;  Location: [facility];  Service: Orthopedics;  Laterality: Left;  70 mins  . TUBAL LIGATION    . UPPER GI ENDOSCOPY     Social History   Tobacco Use  . Smoking status: Never Smoker  . Smokeless tobacco: Never Used  Vaping Use  . Vaping  Use: Never used  Substance Use Topics  . Alcohol use: No    Alcohol/week: 0.0 standard drinks  . Drug use: No   Family History  Problem Relation Age of Onset  . Stroke Mother   . Hypertension Mother   . Colon cancer Mother   . Drug abuse Son   . Stroke Brother   . Colon polyps Neg Hx   . Crohn's disease Neg Hx   . Pancreatic cancer Neg Hx   . Rectal cancer Neg Hx   . Stomach cancer Neg Hx   . Ulcerative colitis Neg Hx   . Breast cancer Neg Hx    Allergies  Allergen Reactions  . Ace Inhibitors Cough  . Ambien [Zolpidem Tartrate] Other (See Comments)    Affected memory   Current Outpatient Medications on File Prior to Visit  Medication Sig Dispense Refill  . Calcium Carbonate-Vitamin D (CALCIUM-VITAMIN D) 500-200 MG-UNIT per tablet Take 1 tablet by mouth daily.      Marland Kitchen docusate sodium (COLACE) 100 MG capsule Take 1 capsule (100 mg total) by mouth 2 (two) times daily. 10 capsule 0  . famotidine (PEPCID) 40 MG tablet Take 1 tablet (40 mg total) by mouth daily. (Patient taking differently: Take 40 mg by mouth daily as needed. ) 60 tablet 11  . FLUoxetine (PROZAC) 40 MG capsule Take 1 capsule (40 mg total) by mouth daily. 90 capsule 3  . fluticasone (FLONASE) 50 MCG/ACT nasal spray Place 2 sprays into both nostrils daily. 48 g 3  . hydrochlorothiazide (MICROZIDE) 12.5 MG capsule Take 1 capsule (12.5 mg total) by mouth daily. 90 capsule 3  . metFORMIN (GLUCOPHAGE-XR) 500 MG 24 hr tablet TAKE 1 TABLET BY MOUTH ONCE A DAY WITH BREAKFAST. 90 tablet 3  . Multiple Vitamins-Minerals (MULTIVITAMIN PO) Take 1 tablet by mouth daily.    . pantoprazole (PROTONIX) 40 MG tablet Take 1 tablet (40 mg total) by mouth 2 (two) times daily. 180 tablet 3  . pravastatin (PRAVACHOL) 80 MG tablet Take 1 tablet (80 mg total) by mouth daily. 90 tablet 3  . Tetrahydrozoline HCl (VISINE OP) Place 1 drop into both eyes as needed (for dry eyes).    . traZODone (DESYREL) 50 MG tablet TAKE 1/2 TO 1 TABLET AT  BEDTIME AS NEEDED FOR SLEEP 90 tablet 3   No current facility-administered medications on file prior to visit.    Review of Systems  Constitutional: Negative for activity change, appetite change, fatigue, fever and unexpected weight change.  HENT: Negative for congestion, ear pain, rhinorrhea, sinus pressure and sore throat.   Eyes: Negative for pain, redness and visual disturbance.  Respiratory: Negative for cough, shortness of breath and wheezing.   Cardiovascular: Negative for chest pain and palpitations.  Gastrointestinal: Negative for abdominal pain, blood in stool, constipation and diarrhea.  Endocrine: Negative for polydipsia and polyuria.  Genitourinary: Negative for dysuria, frequency and  urgency.  Musculoskeletal: Positive for arthralgias and neck stiffness. Negative for back pain, joint swelling and myalgias.  Skin: Negative for pallor and rash.  Allergic/Immunologic: Negative for environmental allergies.  Neurological: Positive for numbness. Negative for dizziness, syncope, facial asymmetry, weakness and headaches.  Hematological: Negative for adenopathy. Does not bruise/bleed easily.  Psychiatric/Behavioral: Negative for decreased concentration and dysphoric mood. The patient is not nervous/anxious.        Objective:   Physical Exam Constitutional:      General: She is not in acute distress.    Appearance: Normal appearance. She is obese. She is not ill-appearing.  Eyes:     General: No scleral icterus.    Conjunctiva/sclera: Conjunctivae normal.     Pupils: Pupils are equal, round, and reactive to light.  Cardiovascular:     Rate and Rhythm: Normal rate and regular rhythm.     Pulses: Normal pulses.  Pulmonary:     Effort: Pulmonary effort is normal. No respiratory distress.     Breath sounds: Normal breath sounds. No wheezing or rales.  Musculoskeletal:     Right shoulder: Tenderness present. No swelling, deformity, effusion or crepitus. Normal range of motion.  Normal pulse.     Right forearm: Tenderness present. No swelling, edema, deformity or bony tenderness.     Right wrist: Tenderness present. No swelling, deformity, snuff box tenderness or crepitus. Normal range of motion.     Right hand: Tenderness present. No swelling or deformity. Normal range of motion. Normal strength. Normal sensation. There is no disruption of two-point discrimination. Normal capillary refill. Normal pulse.     Cervical back: Normal range of motion and neck supple. No swelling, deformity, erythema, signs of trauma, rigidity or bony tenderness. Pain with movement present. Normal range of motion.     Comments: Nl rom of cs w/o tenderness, some scapular pain on full flexion  Mild acromion tenderness -right  Mild medial epicondyle tenderness w/o swelling  Anterior wrist if mildly tender (mildly pos tinel)    Lymphadenopathy:     Cervical: No cervical adenopathy.  Skin:    General: Skin is warm and dry.     Coloration: Skin is not pale.     Findings: No erythema or rash.  Neurological:     Mental Status: She is alert.     Cranial Nerves: No cranial nerve deficit.     Sensory: Sensory deficit present.     Motor: No weakness.     Coordination: Coordination normal.     Deep Tendon Reflexes: Reflexes normal.     Comments: Dec sens to light touch over 4,5th fingers  Mildly pos tinel's of R wrist    Psychiatric:        Mood and Affect: Mood normal.           Assessment & Plan:   Problem List Items Addressed This Visit      Other   Right hand paresthesia - Primary    Hand and arm Disc poss impingement or radiculopathy Films ordered-cs and shoulder  Some pos resp to tinel test as well   Enc ice to affected areas  meloxicam px  inst to get otc carpal tunnel wrist splint to try at night       Relevant Orders   DG Cervical Spine Complete   Right arm pain    And paresthesia with shoulder pain as well  Disc poss locations of nerve impingement/  neck/shoulder/elbow  Xray of cs and shoulder today  meloxicam px  Enc to try carpal tunnel splint at night Plan to follow       Relevant Orders   DG Shoulder Right   DG Cervical Spine Complete   Right shoulder pain    With arm pain and paresthesias  Fairly good rom  Mildly positive hawking test  Xray today       Relevant Orders   DG Shoulder Right

## 2019-08-18 NOTE — Assessment & Plan Note (Signed)
Hand and arm Disc poss impingement or radiculopathy Films ordered-cs and shoulder  Some pos resp to tinel test as well   Enc ice to affected areas  meloxicam px  inst to get otc carpal tunnel wrist splint to try at night

## 2019-08-19 ENCOUNTER — Telehealth: Payer: Self-pay | Admitting: Family Medicine

## 2019-08-19 DIAGNOSIS — M5090 Cervical disc disorder, unspecified, unspecified cervical region: Secondary | ICD-10-CM

## 2019-08-19 NOTE — Telephone Encounter (Signed)
-----   Message from Tammi Sou, [place] sent at 08/19/2019 11:32 AM EDT ----- Pt notified of xray results and Dr. Marliss Coots comments. Pt does want to proceed with Ortho referral. Pt said she has seen [facility] in the past they did both of her hip replacements, but they have changed their name. Pt not sure of the new name but it use to be called [facility]. I advised pt PCP will put referral in and our [facility] will call her to schedule appt

## 2019-08-19 NOTE — Telephone Encounter (Signed)
Ref done  Will route to [facility] 

## 2019-08-21 NOTE — Telephone Encounter (Signed)
Appt scheduled with Dr Delilah Shan for 08/29/19 at 8:00am , patient notified

## 2019-12-31 ENCOUNTER — Other Ambulatory Visit: Payer: Self-pay

## 2019-12-31 ENCOUNTER — Ambulatory Visit (INDEPENDENT_AMBULATORY_CARE_PROVIDER_SITE_OTHER): Payer: Medicare HMO

## 2019-12-31 DIAGNOSIS — Z23 Encounter for immunization: Secondary | ICD-10-CM

## 2020-01-22 ENCOUNTER — Other Ambulatory Visit: Payer: Self-pay | Admitting: Family Medicine

## 2020-03-01 ENCOUNTER — Other Ambulatory Visit: Payer: Self-pay | Admitting: *Deleted

## 2020-03-01 NOTE — Telephone Encounter (Signed)
Refills provided. Patient is due for AWE in March. Please schedule patient. Thank you

## 2020-03-01 NOTE — Telephone Encounter (Signed)
Patient called stating that she needs refill sent to [facility] mail order pharmacy for Fluoxetine and Pantoprazole. Patient stated that [facility] told her that she needed to call and request these refills from her provider.

## 2020-03-02 NOTE — Telephone Encounter (Signed)
I left a detailed message on patient's voice mail to call back and scheduled awv after 03/31/20.

## 2020-03-03 MED ORDER — PANTOPRAZOLE SODIUM 40 MG PO TBEC
40.0000 mg | DELAYED_RELEASE_TABLET | Freq: Two times a day (BID) | ORAL | 0 refills | Status: DC
Start: 2020-03-03 — End: 2020-05-07

## 2020-03-03 MED ORDER — FLUOXETINE HCL 40 MG PO CAPS
40.0000 mg | ORAL_CAPSULE | Freq: Every day | ORAL | 0 refills | Status: DC
Start: 2020-03-03 — End: 2020-05-07

## 2020-04-30 ENCOUNTER — Other Ambulatory Visit (INDEPENDENT_AMBULATORY_CARE_PROVIDER_SITE_OTHER): Payer: Medicare HMO

## 2020-04-30 ENCOUNTER — Telehealth (INDEPENDENT_AMBULATORY_CARE_PROVIDER_SITE_OTHER): Payer: Medicare HMO | Admitting: Family Medicine

## 2020-04-30 ENCOUNTER — Ambulatory Visit (INDEPENDENT_AMBULATORY_CARE_PROVIDER_SITE_OTHER): Payer: Medicare HMO

## 2020-04-30 ENCOUNTER — Other Ambulatory Visit: Payer: Self-pay

## 2020-04-30 DIAGNOSIS — E785 Hyperlipidemia, unspecified: Secondary | ICD-10-CM | POA: Diagnosis not present

## 2020-04-30 DIAGNOSIS — E119 Type 2 diabetes mellitus without complications: Secondary | ICD-10-CM

## 2020-04-30 DIAGNOSIS — Z0000 Encounter for general adult medical examination without abnormal findings: Secondary | ICD-10-CM

## 2020-04-30 DIAGNOSIS — I10 Essential (primary) hypertension: Secondary | ICD-10-CM | POA: Diagnosis not present

## 2020-04-30 DIAGNOSIS — E1169 Type 2 diabetes mellitus with other specified complication: Secondary | ICD-10-CM

## 2020-04-30 LAB — CBC WITH DIFFERENTIAL/PLATELET
Basophils Absolute: 0 10*3/uL (ref 0.0–0.1)
Basophils Relative: 0.6 % (ref 0.0–3.0)
Eosinophils Absolute: 0.1 10*3/uL (ref 0.0–0.7)
Eosinophils Relative: 2.5 % (ref 0.0–5.0)
HCT: 41 % (ref 36.0–46.0)
Hemoglobin: 13.7 g/dL (ref 12.0–15.0)
Lymphocytes Relative: 40.7 % (ref 12.0–46.0)
Lymphs Abs: 1.9 10*3/uL (ref 0.7–4.0)
MCHC: 33.3 g/dL (ref 30.0–36.0)
MCV: 90.4 fl (ref 78.0–100.0)
Monocytes Absolute: 0.4 10*3/uL (ref 0.1–1.0)
Monocytes Relative: 9.1 % (ref 3.0–12.0)
Neutro Abs: 2.2 10*3/uL (ref 1.4–7.7)
Neutrophils Relative %: 47.1 % (ref 43.0–77.0)
Platelets: 231 10*3/uL (ref 150.0–400.0)
RBC: 4.54 Mil/uL (ref 3.87–5.11)
RDW: 13.9 % (ref 11.5–15.5)
WBC: 4.7 10*3/uL (ref 4.0–10.5)

## 2020-04-30 LAB — LIPID PANEL
Cholesterol: 193 mg/dL (ref 0–200)
HDL: 69.4 mg/dL (ref >39.00)
LDL Cholesterol: 91 mg/dL (ref 0–99)
NonHDL: 123.62
Total CHOL/HDL Ratio: 3
Triglycerides: 161 mg/dL — ABNORMAL HIGH (ref 0.0–149.0)
VLDL: 32.2 mg/dL (ref 0.0–40.0)

## 2020-04-30 LAB — COMPREHENSIVE METABOLIC PANEL
ALT: 11 U/L (ref 0–35)
AST: 13 U/L (ref 0–37)
Albumin: 4.3 g/dL (ref 3.5–5.2)
Alkaline Phosphatase: 55 U/L (ref 39–117)
BUN: 18 mg/dL (ref 6–23)
CO2: 32 mEq/L (ref 19–32)
Calcium: 9.5 mg/dL (ref 8.4–10.5)
Chloride: 102 mEq/L (ref 96–112)
Creatinine, Ser: 1.01 mg/dL (ref 0.40–1.20)
GFR: 55.91 mL/min — ABNORMAL LOW (ref >60.00)
Glucose, Bld: 94 mg/dL (ref 70–99)
Potassium: 4 mEq/L (ref 3.5–5.1)
Sodium: 141 mEq/L (ref 135–145)
Total Bilirubin: 0.6 mg/dL (ref 0.2–1.2)
Total Protein: 6.8 g/dL (ref 6.0–8.3)

## 2020-04-30 LAB — TSH: TSH: 1.06 u[IU]/mL (ref 0.35–4.50)

## 2020-04-30 LAB — HEMOGLOBIN A1C: Hgb A1c MFr Bld: 5.5 % (ref 4.6–6.5)

## 2020-04-30 NOTE — Progress Notes (Signed)
Subjective:   Connie Rowe is a 70 y.o. female who presents for Medicare Annual (Subsequent) preventive examination.  Review of Systems: N/A     I connected with the patient today by telephone and verified that I am speaking with the correct person using two identifiers. Location patient: home Location nurse: work Persons participating in the telephone visit: patient, nurse.   I discussed the limitations, risks, security and privacy concerns of performing an evaluation and management service by telephone and the availability of in person appointments. I also discussed with the patient that there may be a patient responsible charge related to this service. The patient expressed understanding and verbally consented to this telephonic visit.        Cardiac Risk Factors include: advanced age (>27men, >12 women);diabetes mellitus;Other (see comment), Risk factor comments: hyperlipidemia     Objective:    Today's Vitals   04/30/20 0852  PainSc: 7    There is no height or weight on file to calculate BMI.  Advanced Directives 04/30/2020 11/29/2017 01/09/2017 01/05/2017 11/15/2016 01/05/2016 10/15/2015  Does Patient Have a Medical Advance Directive? Yes No No No No No No  Type of Paramedic of [place];Living will - - - - - -  Does patient want to make changes to medical advance directive? - Yes (MAU/Ambulatory/Procedural Areas - Information given) - - - - -  Copy of [place] in Chart? No - copy requested - - - - - -  Would patient like information on creating a medical advance directive? - - No - Patient declined No - Patient declined Yes (MAU/Ambulatory/Procedural Areas - Information given) - Yes - Educational materials given    Current Medications (verified) Outpatient Encounter Medications as of 04/30/2020  Medication Sig  . Calcium Carbonate-Vitamin D (CALCIUM-VITAMIN D) 500-200 MG-UNIT per tablet Take 1 tablet by mouth daily.  Marland Kitchen docusate  sodium (COLACE) 100 MG capsule Take 1 capsule (100 mg total) by mouth 2 (two) times daily.  . famotidine (PEPCID) 40 MG tablet Take 1 tablet (40 mg total) by mouth daily. (Patient taking differently: Take 40 mg by mouth daily as needed.)  . FLUoxetine (PROZAC) 40 MG capsule Take 1 capsule (40 mg total) by mouth daily.  . fluticasone (FLONASE) 50 MCG/ACT nasal spray Place 2 sprays into both nostrils daily.  . hydrochlorothiazide (MICROZIDE) 12.5 MG capsule Take 1 capsule (12.5 mg total) by mouth daily.  . meloxicam (MOBIC) 15 MG tablet Take 1 tablet (15 mg total) by mouth daily. With a meal  . metFORMIN (GLUCOPHAGE-XR) 500 MG 24 hr tablet TAKE 1 TABLET BY MOUTH ONCE A DAY WITH BREAKFAST.  . Multiple Vitamins-Minerals (MULTIVITAMIN PO) Take 1 tablet by mouth daily.  . pantoprazole (PROTONIX) 40 MG tablet Take 1 tablet (40 mg total) by mouth 2 (two) times daily.  . pravastatin (PRAVACHOL) 80 MG tablet Take 1 tablet (80 mg total) by mouth daily.  . Tetrahydrozoline HCl (VISINE OP) Place 1 drop into both eyes as needed (for dry eyes).  . traZODone (DESYREL) 50 MG tablet TAKE 1/2 TO 1 TABLET AT BEDTIME AS NEEDED FOR SLEEP   No facility-administered encounter medications on file as of 04/30/2020.    Allergies (verified) Ace inhibitors and Ambien [zolpidem tartrate]   History: Past Medical History:  Diagnosis Date  . Arthritis   . Colon polyps   . Depression   . Esophageal stricture   . Falls   . GERD (gastroesophageal reflux disease)   . H/O  fracture of humerus 2006  . History of bronchitis   . History of hiatal hernia   . Hyperlipidemia   . Hypertension   . Nocturia   . Osteopenia   . Pancreatitis    History of   Past Surgical History:  Procedure Laterality Date  . BLADDER REPAIR    . COLONOSCOPY    . DIAGNOSTIC LAPAROSCOPY     tubal ligation  . DILATION AND CURETTAGE OF UTERUS    . ESOPHAGEAL DILATION    . LUMBAR LAMINECTOMY/DECOMPRESSION MICRODISCECTOMY Right 09/23/2014    Procedure: Laminectomy and Foraminotomy - Lumbar three- lumbar four - right ;  Surgeon: Eustace Moore, MD;  Location: [facility];  Service: Neurosurgery;  Laterality: Right;  . MULTIPLE TOOTH EXTRACTIONS    . TONSILLECTOMY    . TOTAL HIP ARTHROPLASTY Right 03/30/2015   Procedure: RIGHT TOTAL HIP ARTHROPLASTY ANTERIOR APPROACH;  Surgeon: Paralee Cancel, MD;  Location: [facility];  Service: Orthopedics;  Laterality: Right;  . TOTAL HIP ARTHROPLASTY Left 01/09/2017   Procedure: LEFT TOTAL HIP ARTHROPLASTY ANTERIOR APPROACH;  Surgeon: Paralee Cancel, MD;  Location: [facility];  Service: Orthopedics;  Laterality: Left;  70 mins  . TUBAL LIGATION    . UPPER GI ENDOSCOPY     Family History  Problem Relation Age of Onset  . Stroke Mother   . Hypertension Mother   . Colon cancer Mother   . Drug abuse Son   . Stroke Brother   . Colon polyps Neg Hx   . Crohn's disease Neg Hx   . Pancreatic cancer Neg Hx   . Rectal cancer Neg Hx   . Stomach cancer Neg Hx   . Ulcerative colitis Neg Hx   . Breast cancer Neg Hx    Social History   Socioeconomic History  . Marital status: Married    Spouse name: Not on file  . Number of children: Not on file  . Years of education: Not on file  . Highest education level: Not on file  Occupational History  . Not on file  Tobacco Use  . Smoking status: Never Smoker  . Smokeless tobacco: Never Used  Vaping Use  . Vaping Use: Never used  Substance and Sexual Activity  . Alcohol use: No    Alcohol/week: 0.0 standard drinks  . Drug use: No  . Sexual activity: Yes  Other Topics Concern  . Not on file  Social History Narrative  . Not on file   Social Determinants of Health   Financial Resource Strain: Low Risk   . Difficulty of Paying Living Expenses: Not hard at all  Food Insecurity: No Food Insecurity  . Worried About Charity fundraiser in the Last Year: Never true  . Ran Out of Food in the Last Year: Never true  Transportation Needs: No Transportation Needs   . Lack of Transportation (Medical): No  . Lack of Transportation (Non-Medical): No  Physical Activity: Insufficiently Active  . Days of Exercise per Week: 3 days  . Minutes of Exercise per Session: 20 min  Stress: No Stress Concern Present  . Feeling of Stress : Not at all  Social Connections: Not on file    Tobacco Counseling Counseling given: Not Answered   Clinical Intake:  Pre-visit preparation completed: Yes  Pain : 0-10 Pain Score: 7  Pain Type: Acute pain Pain Location: Rib cage Pain Orientation: Right Pain Descriptors / Indicators: Aching Pain Onset: More than a month ago Pain Frequency: Intermittent  Nutritional Risks: None Diabetes: Yes CBG done?: No Did pt. bring in CBG monitor from home?: No  How often do you need to have someone help you when you read instructions, pamphlets, or other written materials from your doctor or pharmacy?: 1 - Never What is the last grade level you completed in school?: 12th  Diabetic: Yes Nutrition Risk Assessment:  Has the patient had any N/V/D within the last 2 months?  No  Does the patient have any non-healing wounds?  No  Has the patient had any unintentional weight loss or weight gain?  No   Diabetes:  Is the patient diabetic?  Yes  If diabetic, was a CBG obtained today?  No  telephone visit  Did the patient bring in their glucometer from home?  No  telephone visit  How often do you monitor your CBG's? Never.   Financial Strains and Diabetes Management:  Are you having any financial strains with the device, your supplies or your medication? No .  Does the patient want to be seen by Chronic Care Management for management of their diabetes?  No  Would the patient like to be referred to a Nutritionist or for Diabetic Management?  No   Diabetic Exams:  Diabetic Eye Exam: Overdue for diabetic eye exam. Pt has been advised about the importance in completing this exam. Patient advised to call and schedule an eye  exam. Diabetic Foot Exam: Overdue, Pt has been advised about the importance in completing this exam. Pt is scheduled for diabetic foot exam on 05/07/2020.   Interpreter Needed?: No  Information entered by :: CJohnson, LPN   Activities of Daily Living In your present state of health, do you have any difficulty performing the following activities: 04/30/2020  Hearing? N  Vision? N  Difficulty concentrating or making decisions? N  Walking or climbing stairs? N  Dressing or bathing? N  Doing errands, shopping? N  Preparing Food and eating ? N  Using the Toilet? N  In the past six months, have you accidently leaked urine? N  Do you have problems with loss of bowel control? N  Managing your Medications? N  Managing your Finances? N  Housekeeping or managing your Housekeeping? N  Some recent data might be hidden    Patient Care Team: Tower, Wynelle Fanny, MD as PCP - General  Indicate any recent Medical Services you may have received from other than [facility] providers in the past year (date may be approximate).     Assessment:   This is a routine wellness examination for [facility].  Hearing/Vision screen  Hearing Screening   125Hz  250Hz  500Hz  1000Hz  2000Hz  3000Hz  4000Hz  6000Hz  8000Hz   Right ear:           Left ear:           Vision Screening Comments: Patient gets annual eye exams  Dietary issues and exercise activities discussed: Current Exercise Habits: Home exercise routine, Type of exercise: walking, Time (Minutes): 25, Frequency (Times/Week): 3, Weekly Exercise (Minutes/Week): 75, Intensity: Moderate, Exercise limited by: None identified  Goals    . Increase physical activity     Starting 11/29/2017, I will continue to walk for 15 minutes daily.     . Patient Stated     04/30/2020, I will continue to walk 3 days a week for 20-25 minutes       Depression Screen PHQ 2/9 Scores 04/30/2020 04/01/2019 11/29/2017 11/15/2016 10/15/2015 06/03/2014  PHQ - 2 Score 0 0 1 0 0 0  PHQ- 9 Score 0 5 1  0 - -    Fall Risk Fall Risk  04/30/2020 04/01/2019 11/29/2017 11/15/2016 10/15/2015  Falls in the past year? 0 0 No No No  Number falls in past yr: 0 - - - -  Injury with Fall? 0 - - - -  Risk for fall due to : Medication side effect - - - -  Follow up Falls evaluation completed;Falls prevention discussed Falls evaluation completed - - -    FALL RISK PREVENTION PERTAINING TO THE HOME:  Any stairs in or around the home? Yes  If so, are there any without handrails? No  Home free of loose throw rugs in walkways, pet beds, electrical cords, etc? Yes  Adequate lighting in your home to reduce risk of falls? Yes   ASSISTIVE DEVICES UTILIZED TO PREVENT FALLS:  Life alert? No  Use of a cane, walker or w/c? No  Grab bars in the bathroom? No  Shower chair or bench in shower? No  Elevated toilet seat or a handicapped toilet? No   TIMED UP AND GO:  Was the test performed? N/A telephone visit.    Cognitive Function: MMSE - Mini Mental State Exam 04/30/2020 11/29/2017 11/15/2016 10/15/2015  Orientation to time 5 5 5 5   Orientation to Place 5 5 5 5   Registration 3 3 3 3   Attention/ Calculation 5 0 0 0  Recall 3 3 3 3   Language- name 2 objects - 0 0 0  Language- repeat 1 1 1 1   Language- follow 3 step command - 3 3 3   Language- read & follow direction - 0 0 0  Write a sentence - 0 0 0  Copy design - 0 0 0  Total score - 20 20 20   Mini Cog  Mini-Cog screen was completed. Maximum score is 22. A value of 0 denotes this part of the MMSE was not completed or the patient failed this part of the Mini-Cog screening.       Immunizations Immunization History  Administered Date(s) Administered  . Fluad Quad(high Dose 65+) 12/31/2019  . Influenza Split 11/17/2010, 12/14/2011  . Influenza Whole 12/07/2006, 11/05/2007, 11/10/2008, 01/05/2010  . Influenza,inj,Quad PF,6+ Mos 12/17/2012, 12/16/2013, 12/31/2014, 10/15/2015, 11/15/2016, 11/29/2017, 09/24/2018  . PFIZER(Purple Top)SARS-COV-2  Vaccination 02/18/2019, 03/11/2019  . Pneumococcal Conjugate-13 06/03/2014  . Pneumococcal Polysaccharide-23 10/15/2015  . Td 10/01/1995, 10/04/2006  . Zoster 09/29/2013    TDAP status: Due, Education has been provided regarding the importance of this vaccine. Advised may receive this vaccine at local pharmacy or Health Dept. Aware to provide a copy of the vaccination record if obtained from local pharmacy or Health Dept. Verbalized acceptance and understanding.  Flu Vaccine status: Up to date  Pneumococcal vaccine status: Up to date  Covid-19 vaccine status: 2 doses completed. Patient aware booster id due  Qualifies for Shingles Vaccine? Yes   Zostavax completed Yes   Shingrix Completed?: No.    Education has been provided regarding the importance of this vaccine. Patient has been advised to call insurance company to determine out of pocket expense if they have not yet received this vaccine. Advised may also receive vaccine at local pharmacy or Health Dept. Verbalized acceptance and understanding.  Screening Tests Health Maintenance  Topic Date Due  . COVID-19 Vaccine (3 - Booster for Pfizer series) 09/08/2019  . HEMOGLOBIN A1C  09/25/2019  . OPHTHALMOLOGY EXAM  12/02/2019  . URINE MICROALBUMIN  03/27/2020  . FOOT EXAM  03/31/2020  . TETANUS/TDAP  10/03/2026 (Originally 10/03/2016)  . MAMMOGRAM  05/06/2020  . INFLUENZA VACCINE  08/30/2020  . COLONOSCOPY (Pts 45-33yrs Insurance coverage will need to be confirmed)  02/14/2025  . DEXA SCAN  Completed  . Hepatitis C Screening  Completed  . PNA vac Low Risk Adult  Completed  . HPV VACCINES  Aged Out    Health Maintenance  Health Maintenance Due  Topic Date Due  . COVID-19 Vaccine (3 - Booster for Pfizer series) 09/08/2019  . HEMOGLOBIN A1C  09/25/2019  . OPHTHALMOLOGY EXAM  12/02/2019  . URINE MICROALBUMIN  03/27/2020  . FOOT EXAM  03/31/2020    Colorectal cancer screening: Type of screening: Colonoscopy. Completed  02/15/2015. Repeat every 10 years  Mammogram status: Completed 05/07/2019. Repeat every year  Bone Density status: Completed 05/14/2019. Results reflect: Bone density results: NORMAL. Repeat every 2-5 years.  Lung Cancer Screening: (Low Dose CT Chest recommended if Age 40-80 years, 30 pack-year currently smoking OR have quit w/in 15years.) does not qualify.   Additional Screening:  Hepatitis C Screening: does qualify; Completed 10/15/2015  Vision Screening: Recommended annual ophthalmology exams for early detection of glaucoma and other disorders of the eye. Is the patient up to date with their annual eye exam?  No  Who is the provider or what is the name of the office in which the patient attends annual eye exams? In the process of finding a new eye doctor If pt is not established with a provider, would they like to be referred to a provider to establish care? No .   Dental Screening: Recommended annual dental exams for proper oral hygiene  Community Resource Referral / Chronic Care Management: CRR required this visit?  No   CCM required this visit?  No      Plan:     I have personally reviewed and noted the following in the patient's chart:   . Medical and social history . Use of alcohol, tobacco or illicit drugs  . Current medications and supplements . Functional ability and status . Nutritional status . Physical activity . Advanced directives . List of other physicians . Hospitalizations, surgeries, and ER visits in previous 12 months . Vitals . Screenings to include cognitive, depression, and falls . Referrals and appointments  In addition, I have reviewed and discussed with patient certain preventive protocols, quality metrics, and best practice recommendations. A written personalized care plan for preventive services as well as general preventive health recommendations were provided to patient.   Due to this being a telephonic visit, the after visit summary with  patients personalized plan was offered to patient via office or my-chart. Patient preferred to pick up at office at next visit or via mychart.   Andrez Grime, LPN   09/07/2798

## 2020-04-30 NOTE — Patient Instructions (Signed)
Connie Rowe , Thank you for taking time to come for your Medicare Wellness Visit. I appreciate your ongoing commitment to your health goals. Please review the following plan we discussed and let me know if I can assist you in the future.   Screening recommendations/referrals: Colonoscopy: Up to date, completed 02/15/2015, due 01/2025 Mammogram: Up to date, completed 05/07/2019, due 04/2020 Bone Density: Up to date, completed 05/14/2019, due 2-5 years  Recommended yearly ophthalmology/optometry visit for glaucoma screening and checkup Recommended yearly dental visit for hygiene and checkup  Vaccinations: Influenza vaccine: Up to date, completed 12/31/2019, due 08/2020 Pneumococcal vaccine: Completed series Tdap vaccine: insurance-decline Shingles vaccine: due, check with your insurance regarding coverage if interested    Covid-19:2 doses completed, booster due   Advanced directives: Please bring a copy of your POA (Power of Attorney) and/or Living Will to your next appointment.   Conditions/risks identified: diabetes, hyperlipidemia   Next appointment: Follow up in one year for your annual wellness visit    Preventive Care 32 Years and Older, Female Preventive care refers to lifestyle choices and visits with your health care provider that can promote health and wellness. What does preventive care include?  A yearly physical exam. This is also called an annual well check.  Dental exams once or twice a year.  Routine eye exams. Ask your health care provider how often you should have your eyes checked.  Personal lifestyle choices, including:  Daily care of your teeth and gums.  Regular physical activity.  Eating a healthy diet.  Avoiding tobacco and drug use.  Limiting alcohol use.  Practicing safe sex.  Taking low-dose aspirin every day.  Taking vitamin and mineral supplements as recommended by your health care provider. What happens during an annual well check? The services  and screenings done by your health care provider during your annual well check will depend on your age, overall health, lifestyle risk factors, and family history of disease. Counseling  Your health care provider may ask you questions about your:  Alcohol use.  Tobacco use.  Drug use.  Emotional well-being.  Home and relationship well-being.  Sexual activity.  Eating habits.  History of falls.  Memory and ability to understand (cognition).  Work and work Statistician.  Reproductive health. Screening  You may have the following tests or measurements:  Height, weight, and BMI.  Blood pressure.  Lipid and cholesterol levels. These may be checked every 5 years, or more frequently if you are over 66 years old.  Skin check.  Lung cancer screening. You may have this screening every year starting at age 85 if you have a 30-pack-year history of smoking and currently smoke or have quit within the past 15 years.  Fecal occult blood test (FOBT) of the stool. You may have this test every year starting at age 18.  Flexible sigmoidoscopy or colonoscopy. You may have a sigmoidoscopy every 5 years or a colonoscopy every 10 years starting at age 41.  Hepatitis C blood test.  Hepatitis B blood test.  Sexually transmitted disease (STD) testing.  Diabetes screening. This is done by checking your blood sugar (glucose) after you have not eaten for a while (fasting). You may have this done every 1-3 years.  Bone density scan. This is done to screen for osteoporosis. You may have this done starting at age 89.  Mammogram. This may be done every 1-2 years. Talk to your health care provider about how often you should have regular mammograms. Talk with your health  care provider about your test results, treatment options, and if necessary, the need for more tests. Vaccines  Your health care provider may recommend certain vaccines, such as:  Influenza vaccine. This is recommended every  year.  Tetanus, diphtheria, and acellular pertussis (Tdap, Td) vaccine. You may need a Td booster every 10 years.  Zoster vaccine. You may need this after age 44.  Pneumococcal 13-valent conjugate (PCV13) vaccine. One dose is recommended after age 4.  Pneumococcal polysaccharide (PPSV23) vaccine. One dose is recommended after age 52. Talk to your health care provider about which screenings and vaccines you need and how often you need them. This information is not intended to replace advice given to you by your health care provider. Make sure you discuss any questions you have with your health care provider. Document Released: 02/12/2015 Document Revised: 10/06/2015 Document Reviewed: 11/17/2014 Elsevier Interactive Patient Education  2017 [place] Prevention in the Home Falls can cause injuries. They can happen to people of all ages. There are many things you can do to make your home safe and to help prevent falls. What can I do on the outside of my home?  Regularly fix the edges of walkways and driveways and fix any cracks.  Remove anything that might make you trip as you walk through a door, such as a raised step or threshold.  Trim any bushes or trees on the path to your home.  Use bright outdoor lighting.  Clear any walking paths of anything that might make someone trip, such as rocks or tools.  Regularly check to see if handrails are loose or broken. Make sure that both sides of any steps have handrails.  Any raised decks and porches should have guardrails on the edges.  Have any leaves, snow, or ice cleared regularly.  Use sand or salt on walking paths during winter.  Clean up any spills in your garage right away. This includes oil or grease spills. What can I do in the bathroom?  Use night lights.  Install grab bars by the toilet and in the tub and shower. Do not use towel bars as grab bars.  Use non-skid mats or decals in the tub or shower.  If you  need to sit down in the shower, use a plastic, non-slip stool.  Keep the floor dry. Clean up any water that spills on the floor as soon as it happens.  Remove soap buildup in the tub or shower regularly.  Attach bath mats securely with double-sided non-slip rug tape.  Do not have throw rugs and other things on the floor that can make you trip. What can I do in the bedroom?  Use night lights.  Make sure that you have a light by your bed that is easy to reach.  Do not use any sheets or blankets that are too big for your bed. They should not hang down onto the floor.  Have a firm chair that has side arms. You can use this for support while you get dressed.  Do not have throw rugs and other things on the floor that can make you trip. What can I do in the kitchen?  Clean up any spills right away.  Avoid walking on wet floors.  Keep items that you use a lot in easy-to-reach places.  If you need to reach something above you, use a strong step stool that has a grab bar.  Keep electrical cords out of the way.  Do not use floor  polish or wax that makes floors slippery. If you must use wax, use non-skid floor wax.  Do not have throw rugs and other things on the floor that can make you trip. What can I do with my stairs?  Do not leave any items on the stairs.  Make sure that there are handrails on both sides of the stairs and use them. Fix handrails that are broken or loose. Make sure that handrails are as long as the stairways.  Check any carpeting to make sure that it is firmly attached to the stairs. Fix any carpet that is loose or worn.  Avoid having throw rugs at the top or bottom of the stairs. If you do have throw rugs, attach them to the floor with carpet tape.  Make sure that you have a light switch at the top of the stairs and the bottom of the stairs. If you do not have them, ask someone to add them for you. What else can I do to help prevent falls?  Wear shoes  that:  Do not have high heels.  Have rubber bottoms.  Are comfortable and fit you well.  Are closed at the toe. Do not wear sandals.  If you use a stepladder:  Make sure that it is fully opened. Do not climb a closed stepladder.  Make sure that both sides of the stepladder are locked into place.  Ask someone to hold it for you, if possible.  Clearly mark and make sure that you can see:  Any grab bars or handrails.  First and last steps.  Where the edge of each step is.  Use tools that help you move around (mobility aids) if they are needed. These include:  Canes.  Walkers.  Scooters.  Crutches.  Turn on the lights when you go into a dark area. Replace any light bulbs as soon as they burn out.  Set up your furniture so you have a clear path. Avoid moving your furniture around.  If any of your floors are uneven, fix them.  If there are any pets around you, be aware of where they are.  Review your medicines with your doctor. Some medicines can make you feel dizzy. This can increase your chance of falling. Ask your doctor what other things that you can do to help prevent falls. This information is not intended to replace advice given to you by your health care provider. Make sure you discuss any questions you have with your health care provider. Document Released: 11/12/2008 Document Revised: 06/24/2015 Document Reviewed: 02/20/2014 Elsevier Interactive Patient Education  2017 [employer].

## 2020-04-30 NOTE — Telephone Encounter (Signed)
Lab orders

## 2020-04-30 NOTE — Progress Notes (Signed)
PCP notes:  Health Maintenance: Covid booster- due Eye exam- due Foot exam- due   Abnormal Screenings: none   Patient concerns: Right sided pain Colonoscopy repeat in 5 years instead of 10 years Can hear heart beating in ear when lying down   Nurse concerns: none   Next PCP appt.: 05/07/2020 @ 8:30 am

## 2020-05-03 ENCOUNTER — Other Ambulatory Visit: Payer: Medicare HMO

## 2020-05-07 ENCOUNTER — Other Ambulatory Visit: Payer: Self-pay

## 2020-05-07 ENCOUNTER — Encounter: Payer: Self-pay | Admitting: Family Medicine

## 2020-05-07 ENCOUNTER — Ambulatory Visit (INDEPENDENT_AMBULATORY_CARE_PROVIDER_SITE_OTHER): Payer: Medicare HMO | Admitting: Family Medicine

## 2020-05-07 VITALS — BP 118/70 | HR 79 | Temp 96.9°F | Ht 63.75 in | Wt 143.4 lb

## 2020-05-07 DIAGNOSIS — E1169 Type 2 diabetes mellitus with other specified complication: Secondary | ICD-10-CM

## 2020-05-07 DIAGNOSIS — Z800 Family history of malignant neoplasm of digestive organs: Secondary | ICD-10-CM | POA: Diagnosis not present

## 2020-05-07 DIAGNOSIS — Z1211 Encounter for screening for malignant neoplasm of colon: Secondary | ICD-10-CM

## 2020-05-07 DIAGNOSIS — E119 Type 2 diabetes mellitus without complications: Secondary | ICD-10-CM

## 2020-05-07 DIAGNOSIS — F418 Other specified anxiety disorders: Secondary | ICD-10-CM | POA: Diagnosis not present

## 2020-05-07 DIAGNOSIS — K219 Gastro-esophageal reflux disease without esophagitis: Secondary | ICD-10-CM | POA: Diagnosis not present

## 2020-05-07 DIAGNOSIS — E785 Hyperlipidemia, unspecified: Secondary | ICD-10-CM

## 2020-05-07 DIAGNOSIS — I10 Essential (primary) hypertension: Secondary | ICD-10-CM

## 2020-05-07 DIAGNOSIS — Z0000 Encounter for general adult medical examination without abnormal findings: Secondary | ICD-10-CM | POA: Diagnosis not present

## 2020-05-07 DIAGNOSIS — M85859 Other specified disorders of bone density and structure, unspecified thigh: Secondary | ICD-10-CM

## 2020-05-07 LAB — MICROALBUMIN / CREATININE URINE RATIO
Creatinine,U: 216.1 mg/dL
Microalb Creat Ratio: 0.6 mg/g (ref 0.0–30.0)
Microalb, Ur: 1.3 mg/dL (ref 0.0–1.9)

## 2020-05-07 MED ORDER — FLUTICASONE PROPIONATE 50 MCG/ACT NA SUSP
2.0000 | Freq: Every day | NASAL | 3 refills | Status: DC
Start: 2020-05-07 — End: 2022-08-24

## 2020-05-07 MED ORDER — TRAZODONE HCL 50 MG PO TABS: ORAL_TABLET | ORAL | 3 refills | Status: DC

## 2020-05-07 MED ORDER — PANTOPRAZOLE SODIUM 40 MG PO TBEC: 40.0000 mg | DELAYED_RELEASE_TABLET | Freq: Two times a day (BID) | ORAL | 3 refills | Status: DC

## 2020-05-07 MED ORDER — HYDROCHLOROTHIAZIDE 12.5 MG PO CAPS: 12.5000 mg | ORAL_CAPSULE | Freq: Every day | ORAL | 3 refills | Status: DC

## 2020-05-07 MED ORDER — FLUOXETINE HCL 40 MG PO CAPS: 40.0000 mg | ORAL_CAPSULE | Freq: Every day | ORAL | 3 refills | Status: DC

## 2020-05-07 MED ORDER — PRAVASTATIN SODIUM 80 MG PO TABS: 80.0000 mg | ORAL_TABLET | Freq: Every day | ORAL | 3 refills | Status: DC

## 2020-05-07 NOTE — Patient Instructions (Addendum)
I placed a referral for GI to talk about symptoms and colonoscopy   If you are interested in the new shingles vaccine (Shingrix) - call your local pharmacy to check on coverage and availability  If affordable, get on a wait list at your pharmacy to get the vaccine.  You can get a covid booster   Get an eye exam (diabetic eye exam)   Please schedule your annual mammogram at [facility]   Urine microalbumin ordered today   You can stay off your metoformin Let's check an a1c in 6 months

## 2020-05-07 NOTE — Progress Notes (Signed)
Subjective:    Patient ID: Connie Rowe, female    DOB: 1950-08-16, 70 y.o.   MRN: [id number]  This visit occurred during the SARS-CoV-2 public health emergency.  Safety protocols were in place, including screening questions prior to the visit, additional usage of staff PPE, and extensive cleaning of exam room while observing appropriate contact time as indicated for disinfecting solutions.    HPI Here for health maintenance exam and to review chronic medical problems   Had amw on 4/1  Wt Readings from Last 3 Encounters:  05/07/20 143 lb 7 oz (65.1 kg)  08/18/19 173 lb 1 oz (78.5 kg)  06/03/19 173 lb 2 oz (78.5 kg)   24.81 kg/m  Trying to loose weight with diet and exercise   Doing ok overall  So many stomach problems  EGD in 2020 -chronic gastritis Still occ has swallowing problems  protonix 40 mg bid  Also pepcid  Needs GI visit   zostavax 8/15 Interested in shingrix   covid immunized- has not had booster because she had covid in January  Did not go to the doctor  Lost since of taste    Flu shot 12/21 pna vaccines utd Td 08  Colonoscopy 2017 -5 y recall Mother had colon cancer when she was 79  Eye exam - needs to go [facility] schedule    Mammogram 4/21-she will schedule  Self breast exam   dexa 4/21-normal bmd Falls -none  Fractures-none Supplements -ca and D  Exercise -walks and exercises at home   HTN bp is stable today  No cp or palpitations or headaches or edema  No side effects to medicines  BP Readings from Last 3 Encounters:  05/07/20 118/70  08/18/19 132/70  06/03/19 118/76     hctz 12.5 mg daily   DM 2 Lab Results  Component Value Date   HGBA1C 5.5 04/30/2020   Stopped her metformin xr 500 daily -recently  Improved  6.4 last time  Weight loss and better diet   Lab Results  Component Value Date   MICROALBUR 1.3 03/28/2019   MICROALBUR 1.0 03/26/2018    Hyperlipidemia Lab Results  Component Value Date   CHOL 193  04/30/2020   CHOL 179 03/28/2019   CHOL 196 09/19/2018   Lab Results  Component Value Date   HDL 69.40 04/30/2020   HDL 60.80 03/28/2019   HDL 62.50 09/19/2018   Lab Results  Component Value Date   LDLCALC 91 04/30/2020   LDLCALC 86 03/28/2019   LDLCALC 107 (H) 09/19/2018   Lab Results  Component Value Date   TRIG 161.0 (H) 04/30/2020   TRIG 158.0 (H) 03/28/2019   TRIG 131.0 09/19/2018   Lab Results  Component Value Date   CHOLHDL 3 04/30/2020   CHOLHDL 3 03/28/2019   CHOLHDL 3 09/19/2018   Lab Results  Component Value Date   LDLDIRECT 126.0 06/03/2014   LDLDIRECT 122.4 09/14/2010   LDLDIRECT 132.2 04/16/2008   Taking pravastatin 80 mg daily  Also watching diet   Patient Active Problem List   Diagnosis Date Noted  . Family history of colon cancer 05/07/2020  . Cervical disc disease 08/19/2019  . Right hand paresthesia 08/18/2019  . Right arm pain 08/18/2019  . Right shoulder pain 08/18/2019  . Atypical chest pain 10/14/2018  . Dysphagia 10/14/2018  . Allergic rhinitis 09/03/2017  . Insomnia 11/27/2016  . Daytime somnolence 05/23/2016  . Snoring 05/23/2016  . Estrogen deficiency 10/19/2015  . Need for hepatitis C screening  test 09/27/2015  . S/P right THA, AA 03/30/2015  . Pre-operative examination 02/24/2015  . S/P lumbar laminectomy 09/23/2014  . Essential hypertension 06/26/2014  . Encounter for Medicare annual wellness exam 06/03/2014  . Colon cancer screening 06/03/2014  . Sciatica 11/26/2013  . Controlled type 2 diabetes mellitus without complication, without long-term current use of insulin ([place]) 04/17/2013  . Hirsutism 12/06/2010  . Routine general medical examination at a health care facility 09/10/2010  . Other screening mammogram 09/01/2010  . Osteopenia 10/22/2006  . URINARY INCONTINENCE, STRESS 08/22/2006  . History of Helicobacter pylori infection 04/18/2006  . Hyperlipidemia associated with type 2 diabetes mellitus ([place]) 04/18/2006  .  Depression with anxiety 04/18/2006  . EXTERNAL HEMORRHOIDS 04/18/2006  . GERD 04/18/2006  . HIATAL HERNIA 04/18/2006  . OVERACTIVE BLADDER 04/18/2006  . HEMATURIA, MICROSCOPIC 04/18/2006  . PANCREATITIS, HX OF 04/18/2006  . DIVERTICULOSIS, COLON 01/02/2000   Past Medical History:  Diagnosis Date  . Arthritis   . Colon polyps   . Depression   . Esophageal stricture   . Falls   . GERD (gastroesophageal reflux disease)   . H/O fracture of humerus 2006  . History of bronchitis   . History of hiatal hernia   . Hyperlipidemia   . Hypertension   . Nocturia   . Osteopenia   . Pancreatitis    History of   Past Surgical History:  Procedure Laterality Date  . BLADDER REPAIR    . COLONOSCOPY    . DIAGNOSTIC LAPAROSCOPY     tubal ligation  . DILATION AND CURETTAGE OF UTERUS    . ESOPHAGEAL DILATION    . LUMBAR LAMINECTOMY/DECOMPRESSION MICRODISCECTOMY Right 09/23/2014   Procedure: Laminectomy and Foraminotomy - Lumbar three- lumbar four - right ;  Surgeon: Eustace Moore, MD;  Location: [facility];  Service: Neurosurgery;  Laterality: Right;  . MULTIPLE TOOTH EXTRACTIONS    . TONSILLECTOMY    . TOTAL HIP ARTHROPLASTY Right 03/30/2015   Procedure: RIGHT TOTAL HIP ARTHROPLASTY ANTERIOR APPROACH;  Surgeon: Paralee Cancel, MD;  Location: [facility];  Service: Orthopedics;  Laterality: Right;  . TOTAL HIP ARTHROPLASTY Left 01/09/2017   Procedure: LEFT TOTAL HIP ARTHROPLASTY ANTERIOR APPROACH;  Surgeon: Paralee Cancel, MD;  Location: [facility];  Service: Orthopedics;  Laterality: Left;  70 mins  . TUBAL LIGATION    . UPPER GI ENDOSCOPY     Social History   Tobacco Use  . Smoking status: Never Smoker  . Smokeless tobacco: Never Used  Vaping Use  . Vaping Use: Never used  Substance Use Topics  . Alcohol use: No    Alcohol/week: 0.0 standard drinks  . Drug use: No   Family History  Problem Relation Age of Onset  . Stroke Mother   . Hypertension Mother   . Colon cancer Mother   . Drug  abuse Son   . Stroke Brother   . Colon polyps Neg Hx   . Crohn's disease Neg Hx   . Pancreatic cancer Neg Hx   . Rectal cancer Neg Hx   . Stomach cancer Neg Hx   . Ulcerative colitis Neg Hx   . Breast cancer Neg Hx    Allergies  Allergen Reactions  . Ace Inhibitors Cough  . Ambien [Zolpidem Tartrate] Other (See Comments)    Affected memory   Current Outpatient Medications on File Prior to Visit  Medication Sig Dispense Refill  . Calcium Carbonate-Vitamin D (CALCIUM-VITAMIN D) 500-200 MG-UNIT per tablet Take 1 tablet by mouth  daily.    . docusate sodium (COLACE) 100 MG capsule Take 1 capsule (100 mg total) by mouth 2 (two) times daily. 10 capsule 0  . famotidine (PEPCID) 40 MG tablet Take 1 tablet (40 mg total) by mouth daily. (Patient taking differently: Take 40 mg by mouth daily as needed.) 60 tablet 11  . Multiple Vitamins-Minerals (MULTIVITAMIN PO) Take 1 tablet by mouth daily.    . Tetrahydrozoline HCl (VISINE OP) Place 1 drop into both eyes as needed (for dry eyes).     No current facility-administered medications on file prior to visit.     Review of Systems  Constitutional: Negative for activity change, appetite change, fatigue, fever and unexpected weight change.  HENT: Negative for congestion, ear pain, rhinorrhea, sinus pressure and sore throat.   Eyes: Negative for pain, redness and visual disturbance.  Respiratory: Negative for cough, shortness of breath and wheezing.   Cardiovascular: Negative for chest pain and palpitations.  Gastrointestinal: Negative for abdominal pain, blood in stool, constipation and diarrhea.  Endocrine: Negative for polydipsia and polyuria.  Genitourinary: Negative for dysuria, frequency and urgency.  Musculoskeletal: Negative for arthralgias, back pain and myalgias.  Skin: Negative for pallor and rash.  Allergic/Immunologic: Negative for environmental allergies.  Neurological: Negative for dizziness, syncope and headaches.  Hematological:  Negative for adenopathy. Does not bruise/bleed easily.  Psychiatric/Behavioral: Negative for decreased concentration and dysphoric mood. The patient is not nervous/anxious.        Objective:   Physical Exam Constitutional:      General: She is not in acute distress.    Appearance: Normal appearance. She is well-developed and normal weight. She is not ill-appearing or diaphoretic.  HENT:     Head: Normocephalic and atraumatic.     Right Ear: Tympanic membrane, ear canal and external ear normal.     Left Ear: Tympanic membrane, ear canal and external ear normal.     Nose: Nose normal. No congestion.     Mouth/Throat:     Mouth: Mucous membranes are moist.     Pharynx: Oropharynx is clear. No posterior oropharyngeal erythema.  Eyes:     General: No scleral icterus.    Extraocular Movements: Extraocular movements intact.     Conjunctiva/sclera: Conjunctivae normal.     Pupils: Pupils are equal, round, and reactive to light.  Neck:     Thyroid: No thyromegaly.     Vascular: No carotid bruit or JVD.  Cardiovascular:     Rate and Rhythm: Normal rate and regular rhythm.     Pulses: Normal pulses.     Heart sounds: Normal heart sounds. No gallop.   Pulmonary:     Effort: Pulmonary effort is normal. No respiratory distress.     Breath sounds: Normal breath sounds. No wheezing.     Comments: Good air exch Chest:     Chest wall: No tenderness.  Abdominal:     General: Bowel sounds are normal. There is no distension or abdominal bruit.     Palpations: Abdomen is soft. There is no mass.     Tenderness: There is no abdominal tenderness.     Hernia: No hernia is present.  Genitourinary:    Comments: Breast exam: No mass, nodules, thickening, tenderness, bulging, retraction, inflamation, nipple discharge or skin changes noted.  No axillary or clavicular LA.     Musculoskeletal:        General: No tenderness. Normal range of motion.     Cervical back: Normal range of motion and neck  supple. No rigidity. No muscular tenderness.     Right lower leg: No edema.     Left lower leg: No edema.  Lymphadenopathy:     Cervical: No cervical adenopathy.  Skin:    General: Skin is warm and dry.     Coloration: Skin is not pale.     Findings: No erythema or rash.     Comments: Solar lentigines diffusely   Neurological:     Mental Status: She is alert. Mental status is at baseline.     Cranial Nerves: No cranial nerve deficit.     Motor: No abnormal muscle tone.     Coordination: Coordination normal.     Gait: Gait normal.     Deep Tendon Reflexes: Reflexes are normal and symmetric.  Psychiatric:        Mood and Affect: Mood normal.        Cognition and Memory: Cognition and memory normal.           Assessment & Plan:   Problem List Items Addressed This Visit      Cardiovascular and Mediastinum   Essential hypertension    bp in fair control at this time  BP Readings from Last 1 Encounters:  05/07/20 118/70   No changes needed Most recent labs reviewed  Disc lifstyle change with low sodium diet and exercise  Plan to continue hctz 12.5 mg daily      Relevant Medications   hydrochlorothiazide (MICROZIDE) 12.5 MG capsule   pravastatin (PRAVACHOL) 80 MG tablet     Digestive   GERD    Still struggling with upper GI symptoms  Chronic gastritis on EGD in 2020  Takes protonix 40 mg bid and pepcid  Ref to GI for this and colon cancer screening        Relevant Medications   pantoprazole (PROTONIX) 40 MG tablet   Other Relevant Orders   Ambulatory referral to Gastroenterology     Endocrine   Hyperlipidemia associated with type 2 diabetes mellitus ([place])    Disc goals for lipids and reasons to control them Rev last labs with pt Rev low sat fat diet in detail Plan to continue pravastatin 80 mg daily LDL of 91 with good HDL      Relevant Medications   pravastatin (PRAVACHOL) 80 MG tablet   Controlled type 2 diabetes mellitus without complication,  without long-term current use of insulin ([facility])    Lab Results  Component Value Date   HGBA1C 5.5 04/30/2020   Stopped her metformin  Improved with diet and exercise and weight loss microalbumin done  Taking a statin       Relevant Medications   pravastatin (PRAVACHOL) 80 MG tablet   Other Relevant Orders   Microalbumin / creatinine urine ratio (Completed)     Musculoskeletal and Integument   Osteopenia    dexa 4/21 actually -bmd in nl range No falls or fractures  Taking ca and D and does walk for exercise         Other   Depression with anxiety   Relevant Medications   traZODone (DESYREL) 50 MG tablet   FLUoxetine (PROZAC) 40 MG capsule   Routine general medical examination at a health care facility - Primary    Reviewed health habits including diet and exercise and skin cancer prevention Reviewed appropriate screening tests for age  Also reviewed health mt list, fam hx and immunization status , as well as social and family history   See HPI Labs reviewed  dexa utd  Ref to GI done for colon cancer screening  Discussed the shingrix vaccine  Due for eye exam-pt will schedule      Colon cancer screening    Due for recall colonoscopy  Ref done Mother had colon cancer at age 50      Family history of colon cancer    Due for recall screening colonoscopy      Relevant Orders   Ambulatory referral to Gastroenterology

## 2020-05-09 NOTE — Assessment & Plan Note (Signed)
bp in fair control at this time  BP Readings from Last 1 Encounters:  05/07/20 118/70   No changes needed Most recent labs reviewed  Disc lifstyle change with low sodium diet and exercise  Plan to continue hctz 12.5 mg daily

## 2020-05-09 NOTE — Assessment & Plan Note (Signed)
Reviewed health habits including diet and exercise and skin cancer prevention Reviewed appropriate screening tests for age  Also reviewed health mt list, fam hx and immunization status , as well as social and family history   See HPI Labs reviewed  dexa utd  Ref to GI done for colon cancer screening  Discussed the shingrix vaccine  Due for eye exam-pt will schedule

## 2020-05-09 NOTE — Assessment & Plan Note (Signed)
Lab Results  Component Value Date   HGBA1C 5.5 04/30/2020   Stopped her metformin  Improved with diet and exercise and weight loss microalbumin done  Taking a statin

## 2020-05-09 NOTE — Assessment & Plan Note (Signed)
Disc goals for lipids and reasons to control them Rev last labs with pt Rev low sat fat diet in detail Plan to continue pravastatin 80 mg daily LDL of 91 with good HDL

## 2020-05-09 NOTE — Assessment & Plan Note (Signed)
Still struggling with upper GI symptoms  Chronic gastritis on EGD in 2020  Takes protonix 40 mg bid and pepcid  Ref to GI for this and colon cancer screening

## 2020-05-09 NOTE — Assessment & Plan Note (Signed)
dexa 4/21 actually -bmd in nl range No falls or fractures  Taking ca and D and does walk for exercise

## 2020-05-09 NOTE — Assessment & Plan Note (Signed)
Due for recall screening colonoscopy

## 2020-05-09 NOTE — Assessment & Plan Note (Signed)
Due for recall colonoscopy  Ref done Mother had colon cancer at age 72

## 2020-05-16 IMAGING — MG DIGITAL SCREENING BILATERAL MAMMOGRAM WITH TOMO AND CAD
6 of 10 series · 6 of 30 positions shown · non-contrast
Comparison: Previous exam(s).

CLINICAL DATA: Screening.

EXAM:
DIGITAL SCREENING BILATERAL MAMMOGRAM WITH TOMO AND CAD

[R MLO synth-2D (1 of 2)]
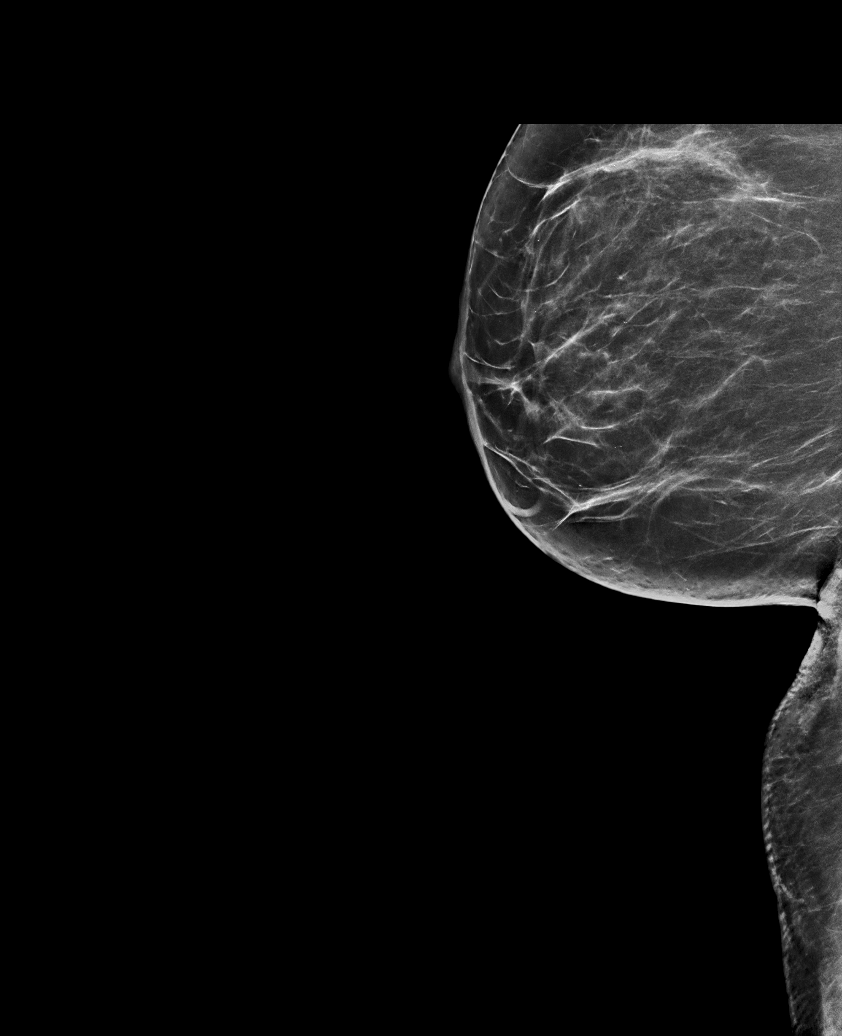

[L CC synth-2D]
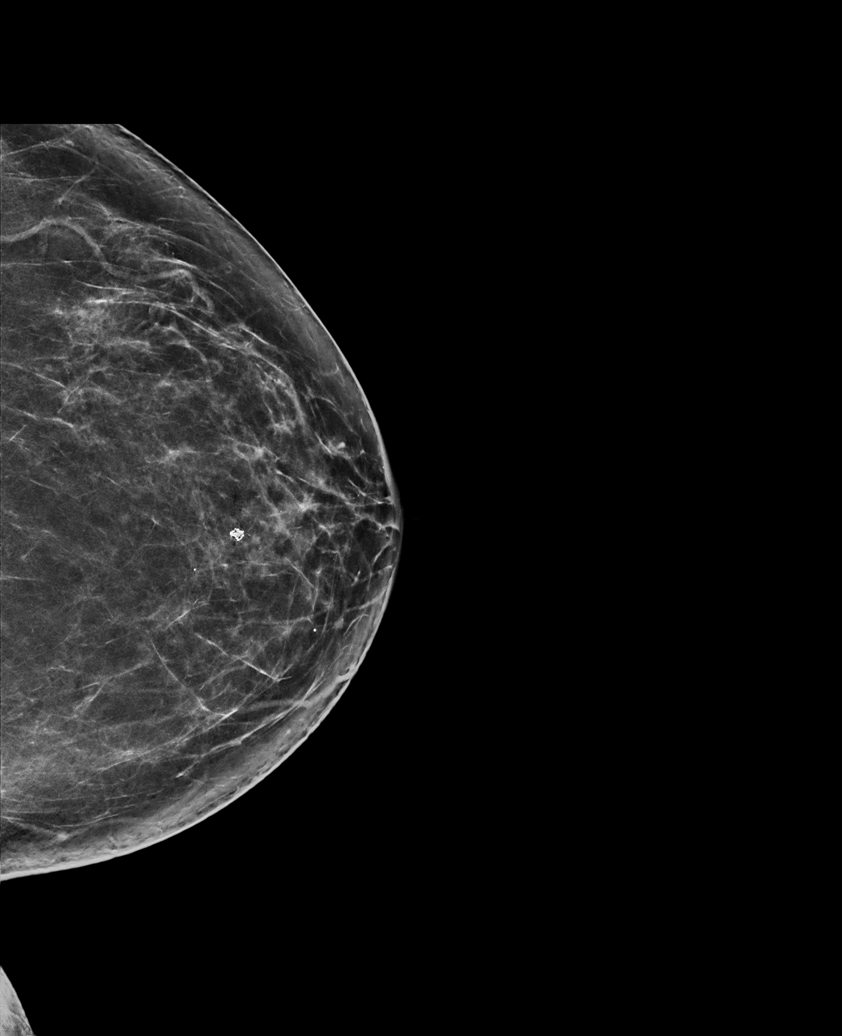

[L MLO synth-2D]
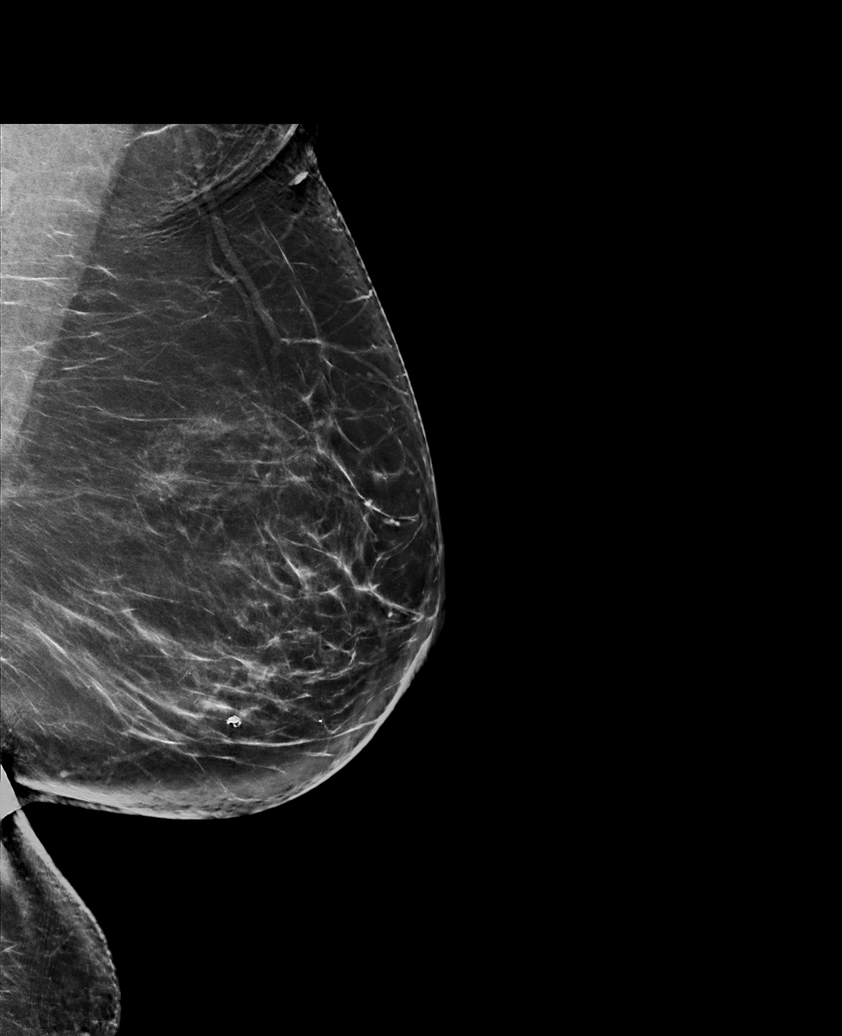

[R MLO synth-2D (2 of 2)]
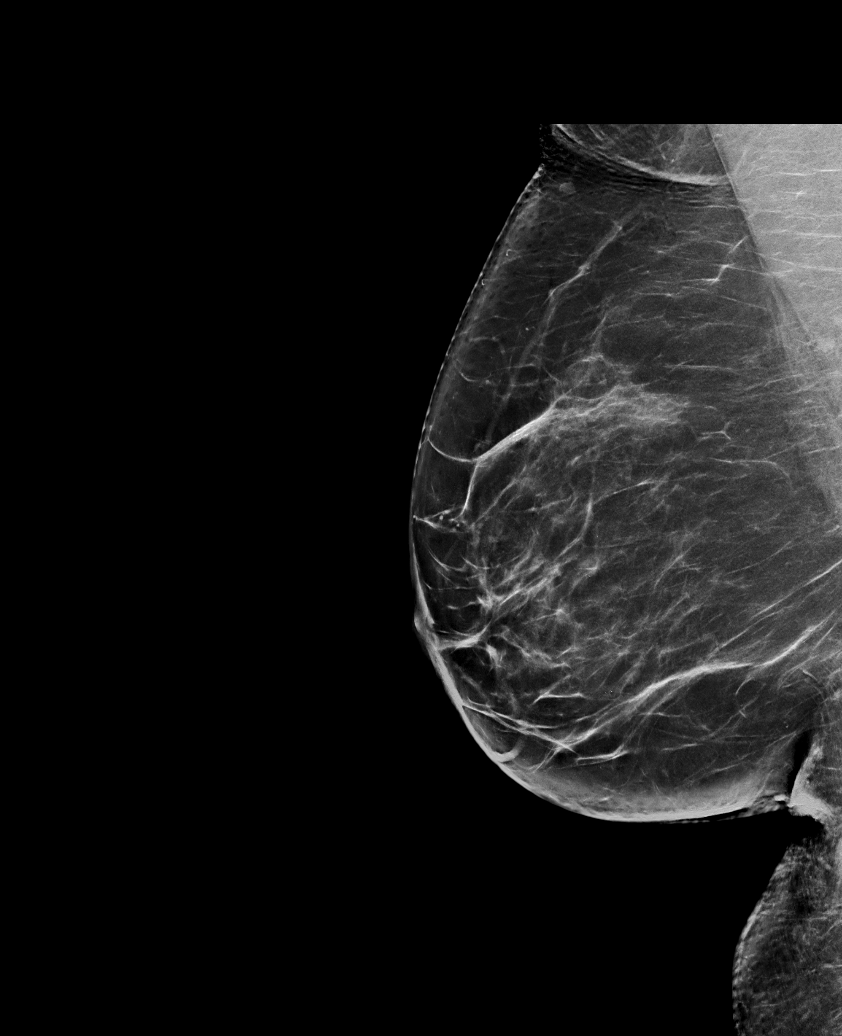

[R CC synth-2D]
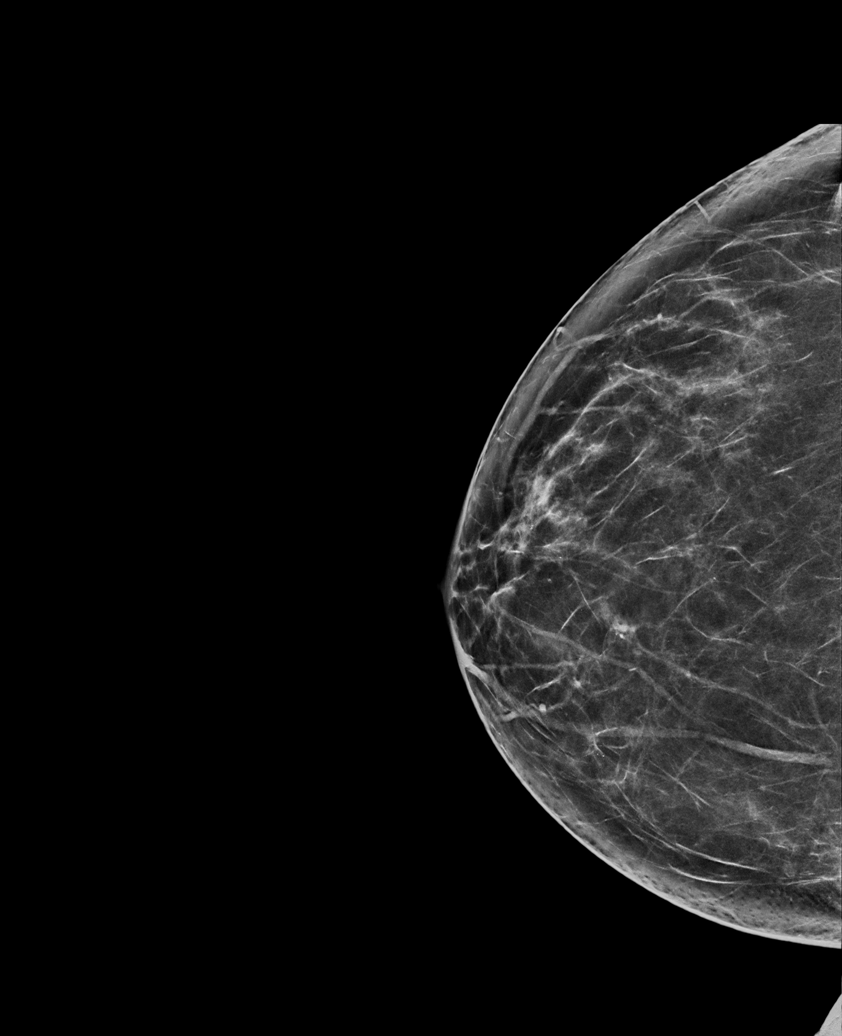

[R MLO tomo · tomo slice 46/91.0]
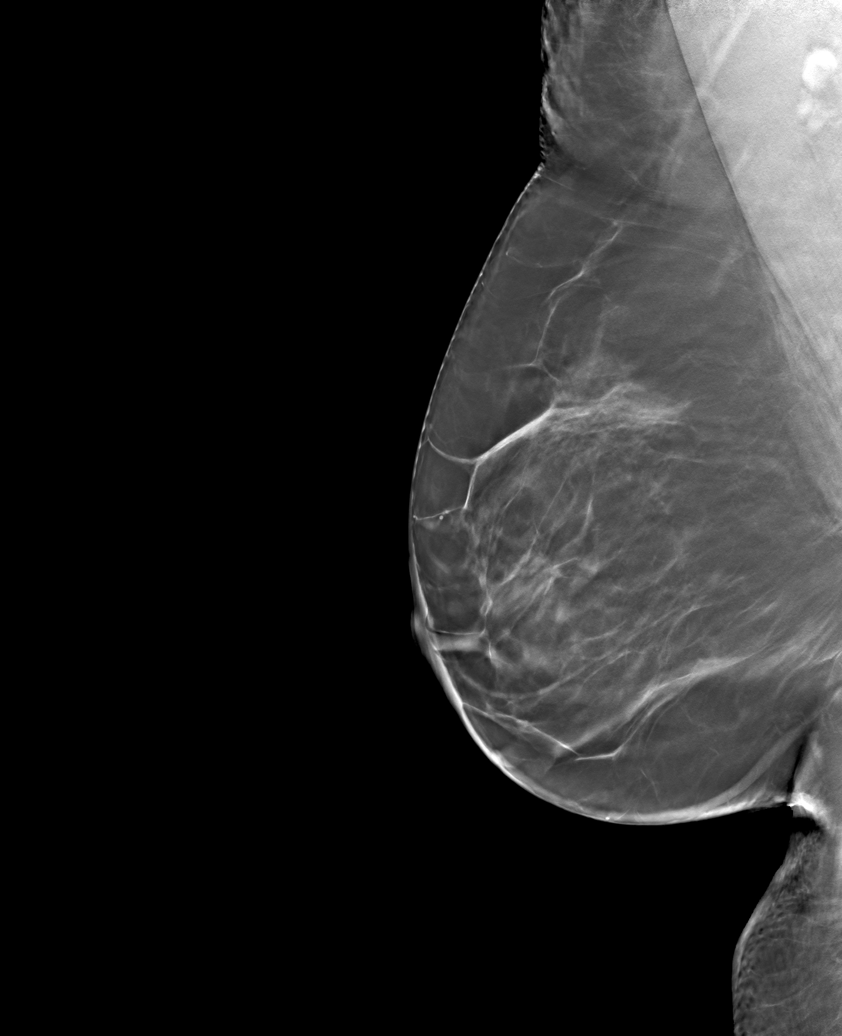

[6 of 30 positions shown; findings below may reference images not displayed]

ACR Breast Density Category b: There are scattered areas of
fibroglandular density.
FINDINGS: There are no findings suspicious for malignancy. Images were
processed with CAD.
IMPRESSION: No mammographic evidence of malignancy. A result letter of this
screening mammogram will be mailed directly to the patient.

RECOMMENDATION:
Screening mammogram in one year. (Code:[id number])

BI-RADS CATEGORY  1: Negative.

## 2020-06-21 ENCOUNTER — Telehealth: Payer: Self-pay | Admitting: Family Medicine

## 2020-06-21 NOTE — Chronic Care Management (AMB) (Signed)
  Chronic Care Management   Note  06/21/2020 Name: [name] MRN: [id number] DOB: September 20, 1950  Connie Rowe is a 70 y.o. year old female who is a primary care patient of Tower, Wynelle Fanny, MD. I reached out to [name] by phone today in response to a referral sent by [name]'s PCP, Tower, Wynelle Fanny, MD.   [name] was given information about Chronic Care Management services today including:  1. CCM service includes personalized support from designated clinical staff supervised by her physician, including individualized plan of care and coordination with other care providers 2. 24/7 contact phone numbers for assistance for urgent and routine care needs. 3. Service will only be billed when office clinical staff spend 20 minutes or more in a month to coordinate care. 4. Only one practitioner may furnish and bill the service in a calendar month. 5. The patient may stop CCM services at any time (effective at the end of the month) by phone call to the office staff.   Patient agreed to services and verbal consent obtained.   Follow up plan:   [name] Secretary/administrator

## 2020-07-30 ENCOUNTER — Telehealth: Payer: Self-pay

## 2020-07-30 NOTE — Chronic Care Management (AMB) (Addendum)
Chronic Care Management Pharmacy Assistant   Name: Connie Rowe  MRN: [id number] DOB: [birth year]   Reason for Encounter: Initial Questions   Conditions to be addressed/monitored: HTN, HLD, and DMII  Recent office visits:  05/07/20 - Dr.Tower PCP referral to Gastroenterology no medication changes, stay off your metformin  Recent consult visits:  None in 6 months  Hospital visits:  None in previous 6 months  Medications: Outpatient Encounter Medications as of 07/30/2020  Medication Sig   Calcium Carbonate-Vitamin D (CALCIUM-VITAMIN D) 500-200 MG-UNIT per tablet Take 1 tablet by mouth daily.   docusate sodium (COLACE) 100 MG capsule Take 1 capsule (100 mg total) by mouth 2 (two) times daily.   famotidine (PEPCID) 40 MG tablet Take 1 tablet (40 mg total) by mouth daily. (Patient taking differently: Take 40 mg by mouth daily as needed.)   FLUoxetine (PROZAC) 40 MG capsule Take 1 capsule (40 mg total) by mouth daily.   fluticasone (FLONASE) 50 MCG/ACT nasal spray Place 2 sprays into both nostrils daily.   hydrochlorothiazide (MICROZIDE) 12.5 MG capsule Take 1 capsule (12.5 mg total) by mouth daily.   Multiple Vitamins-Minerals (MULTIVITAMIN PO) Take 1 tablet by mouth daily.   pantoprazole (PROTONIX) 40 MG tablet Take 1 tablet (40 mg total) by mouth 2 (two) times daily.   pravastatin (PRAVACHOL) 80 MG tablet Take 1 tablet (80 mg total) by mouth daily.   Tetrahydrozoline HCl (VISINE OP) Place 1 drop into both eyes as needed (for dry eyes).   traZODone (DESYREL) 50 MG tablet TAKE 1/2 TO 1 TABLET AT BEDTIME AS NEEDED FOR SLEEP   No facility-administered encounter medications on file as of 07/30/2020.     Lab Results  Component Value Date/Time   HGBA1C 5.5 04/30/2020 09:21 AM   HGBA1C 6.4 (H) 03/28/2019 08:47 AM   MICROALBUR 1.3 05/07/2020 09:10 AM   MICROALBUR 1.3 03/28/2019 08:56 AM     BP Readings from Last 3 Encounters:  05/07/20 118/70  08/18/19 132/70  06/03/19 118/76     Patient contacted to review initial questions prior to visit with Debbora Dus.  Have you seen any other providers since your last visit with PCP? No  Any changes in your medications or health? Yes the patient reports she no longer has to take diabetes medications due to loosing weight and keeping BG's normal  Any side effects from any medications? No  Do you have an symptoms or problems not managed by your medications? Yes  Any concerns about your health right now? No the patient reports she needs to have annual colonoscopy and will schedule this soon  Has your provider asked that you check blood pressure, blood sugar, or follow special diet at home? No  Do you get any type of exercise on a regular basis? Yes she   Can you think of a goal you would like to reach for your health? No  Do you have any problems getting your medications? No the patient reports using [employer] Mail order    Is there anything that you would like to discuss during the appointment? No   Connie Rowe was reminded to have all medications, supplements and any blood glucose and blood pressure readings available for review with Debbora Dus, Pharm. D, at her telephone visit on 08/05/2020 at 2:00pm.    Star Rating Drugs: Medication:  Last Fill: Day Supply Pravastatin 80mg  05/07/20  90   Care Gaps: Last annual wellness visit: 04/30/20 If Diabetic: Last eye exam / retinopathy  screening: Pt.to schedule  Last diabetic foot exam: Pt.to schedule   Debbora Dus, CPP notified  [name], [place] Assistant [phone]  I have reviewed the care management and care coordination activities outlined in this encounter and I am certifying that I agree with the content of this note. No further action required.  Debbora Dus, PharmD Clinical Pharmacist [facility] [phone]

## 2020-08-03 DIAGNOSIS — S91119A Laceration without foreign body of unspecified toe without damage to nail, initial encounter: Secondary | ICD-10-CM | POA: Diagnosis not present

## 2020-08-03 DIAGNOSIS — S92425A Nondisplaced fracture of distal phalanx of left great toe, initial encounter for closed fracture: Secondary | ICD-10-CM | POA: Diagnosis not present

## 2020-08-05 ENCOUNTER — Other Ambulatory Visit: Payer: Self-pay

## 2020-08-05 ENCOUNTER — Ambulatory Visit (INDEPENDENT_AMBULATORY_CARE_PROVIDER_SITE_OTHER): Payer: Medicare HMO

## 2020-08-05 DIAGNOSIS — F418 Other specified anxiety disorders: Secondary | ICD-10-CM

## 2020-08-05 DIAGNOSIS — E785 Hyperlipidemia, unspecified: Secondary | ICD-10-CM

## 2020-08-05 DIAGNOSIS — I10 Essential (primary) hypertension: Secondary | ICD-10-CM | POA: Diagnosis not present

## 2020-08-05 DIAGNOSIS — E1169 Type 2 diabetes mellitus with other specified complication: Secondary | ICD-10-CM | POA: Diagnosis not present

## 2020-08-05 DIAGNOSIS — K219 Gastro-esophageal reflux disease without esophagitis: Secondary | ICD-10-CM

## 2020-08-05 NOTE — Progress Notes (Signed)
Chronic Care Management Pharmacy Note  08/05/2020 Name:  Connie Rowe MRN:  [id number] DOB:  1950/10/03  Summary: Chronic conditions DM, HTN, HLD, Depression, Anxiety stable and controlled. No medication concerns identified. She reports ongoing reflux symptoms at bedtime on PPI BID. She is takes the PPI after supper. She plans to schedule visit with GI soon.  Recommendations/Changes made from today's visit: Recommend taking pantoprazole 30 minutes before meals instead of afterward.  Plan: Follow up 12 months.   Subjective: Connie Rowe is an 70 y.o. year old female who is a primary patient of Tower, Wynelle Fanny, MD.  The CCM team was consulted for assistance with disease management and care coordination needs.    Engaged with patient by telephone for initial visit in response to provider referral for pharmacy case management and/or care coordination services. Denies health concerns. She tripped and broke her toe this week on the steps in her laundry room. She was in a hurry. She went to [facility] and was started on an antibiotic (cephalexin 500 mg x 10 days every 6 hours).  Consent to Services:  The patient was given the following information about Chronic Care Management services today, agreed to services, and gave verbal consent: 1. CCM service includes personalized support from designated clinical staff supervised by the primary care provider, including individualized plan of care and coordination with other care providers 2. 24/7 contact phone numbers for assistance for urgent and routine care needs. 3. Service will only be billed when office clinical staff spend 20 minutes or more in a month to coordinate care. 4. Only one practitioner may furnish and bill the service in a calendar month. 5.The patient may stop CCM services at any time (effective at the end of the month) by phone call to the office staff. 6. The patient will be responsible for cost sharing (co-pay) of up to 20% of the  service fee (after annual deductible is met). Patient agreed to services and consent obtained.  Patient Care Team: Tower, Wynelle Fanny, MD as PCP - Claris Gower, [facility] as Pharmacist (Pharmacist)  CCM Consent 06/21/20  Recent office visits: 05/07/20 - Dr. Glori Bickers, PCP - Referral to Gastroenterology    Recent consult visits:  None in 6 months   Hospital visits:  None in previous 6 months  Objective:  Lab Results  Component Value Date   CREATININE 1.01 04/30/2020   BUN 18 04/30/2020   GFR 55.91 (L) 04/30/2020   GFRNONAA >60 01/10/2017   GFRAA >60 01/10/2017   NA 141 04/30/2020   K 4.0 04/30/2020   CALCIUM 9.5 04/30/2020   CO2 32 04/30/2020   GLUCOSE 94 04/30/2020    Lab Results  Component Value Date/Time   HGBA1C 5.5 04/30/2020 09:21 AM   HGBA1C 6.4 (H) 03/28/2019 08:47 AM   GFR 55.91 (L) 04/30/2020 09:21 AM   GFR 50.60 (L) 03/28/2019 08:47 AM   MICROALBUR 1.3 05/07/2020 09:10 AM   MICROALBUR 1.3 03/28/2019 08:56 AM    Last diabetic Eye exam:  Lab Results  Component Value Date/Time   HMDIABEYEEXA No Retinopathy 12/02/2018 12:00 AM    Last diabetic Foot exam:  05/07/20 with PCP AWV  Lab Results  Component Value Date   CHOL 193 04/30/2020   HDL 69.40 04/30/2020   LDLCALC 91 04/30/2020   LDLDIRECT 126.0 06/03/2014   TRIG 161.0 (H) 04/30/2020   CHOLHDL 3 04/30/2020    Hepatic Function Latest Ref Rng & Units 04/30/2020 03/28/2019 09/19/2018  Total Protein 6.0 -  8.3 g/dL 6.8 7.4 7.0  Albumin 3.5 - 5.2 g/dL 4.3 4.3 4.4  AST 0 - 37 U/L _0 ALT 0 - 35 U/L _1 Alk Phosphatase 39 - 117 U/L 55 59 59  Total Bilirubin 0.2 - 1.2 mg/dL 0.6 0.5 0.3  Bilirubin, Direct 0.0 - 0.3 mg/dL - - -    Lab Results  Component Value Date/Time   TSH 1.06 04/30/2020 09:21 AM   TSH 1.40 03/28/2019 08:47 AM    CBC Latest Ref Rng & Units 04/30/2020 03/28/2019 11/29/2017  WBC 4.0 - 10.5 K/uL 4.7 5.0 5.5  Hemoglobin 12.0 - 15.0 g/dL 13.7 13.3 13.5  Hematocrit 36.0 - 46.0 %  41.0 39.7 40.5  Platelets 150.0 - 400.0 K/uL 231.0 220.0 238.0    Lab Results  Component Value Date/Time   VD25OH 21.68 (L) 06/03/2014 06:43 PM   VD25OH 42 09/14/2010 10:51 AM   VD25OH 32 07/16/2008 09:31 PM    Clinical ASCVD: No  The 10-year ASCVD risk score Mikey Bussing DC Jr., et al., 2013) is: 23.5%   Values used to calculate the score:     Age: 70 years     Sex: Female     Is Non-Hispanic African American: No     Diabetic: Yes     Tobacco smoker: No     Systolic Blood Pressure: 409 mmHg     Is BP treated: Yes     HDL Cholesterol: 69.4 mg/dL     Total Cholesterol: 193 mg/dL    Depression screen [facility] 2/9 04/30/2020 04/01/2019 11/29/2017  Decreased Interest 0 0 0  Down, Depressed, Hopeless 0 0 1  PHQ - 2 Score 0 0 1  Altered sleeping 0 2 0  Tired, decreased energy 0 0 0  Change in appetite 0 3 0  Feeling bad or failure about yourself  0 0 0  Trouble concentrating 0 0 0  Moving slowly or fidgety/restless 0 0 0  Suicidal thoughts 0 0 0  PHQ-9 Score 0 5 1  Difficult doing work/chores Not difficult at all Not difficult at all Not difficult at all    Social History   Tobacco Use  Smoking Status Never  Smokeless Tobacco Never   BP Readings from Last 3 Encounters:  05/07/20 118/70  08/18/19 132/70  06/03/19 118/76   Pulse Readings from Last 3 Encounters:  05/07/20 79  08/18/19 77  06/03/19 76   Wt Readings from Last 3 Encounters:  05/07/20 143 lb 7 oz (65.1 kg)  08/18/19 173 lb 1 oz (78.5 kg)  06/03/19 173 lb 2 oz (78.5 kg)   BMI Readings from Last 3 Encounters:  05/07/20 24.81 kg/m  08/18/19 29.71 kg/m  06/03/19 29.72 kg/m    Assessment/Interventions: Review of patient past medical history, allergies, medications, health status, including review of consultants reports, laboratory and other test data, was performed as part of comprehensive evaluation and provision of chronic care management services.   SDOH:  (Social Determinants of Health) assessments and  interventions performed: Yes SDOH Interventions    Flowsheet Row Most Recent Value  SDOH Interventions   Financial Strain Interventions Intervention Not Indicated      SDOH Screenings   Alcohol Screen: Low Risk    Last Alcohol Screening Score (AUDIT): 0  Depression (PHQ2-9): Low Risk    PHQ-2 Score: 0  Financial Resource Strain: Low Risk    Difficulty of Paying Living Expenses: Not very hard  Food Insecurity: No Food Insecurity   Worried  About Running Out of Food in the Last Year: Never true   Ran Out of Food in the Last Year: Never true  Housing: [place] Risk Score: 0  Physical Activity: Insufficiently Active   Days of Exercise per Week: 3 days   Minutes of Exercise per Session: 20 min  Social Connections: Not on file  Stress: No Stress Concern Present   Feeling of Stress : Not at all  Tobacco Use: Low Risk    Smoking Tobacco Use: Never   Smokeless Tobacco Use: Never  Transportation Needs: No Transportation Needs   Lack of Transportation (Medical): No   Lack of Transportation (Non-Medical): No    CCM Care Plan  Allergies  Allergen Reactions   Ace Inhibitors Cough   Ambien [Zolpidem Tartrate] Other (See Comments)    Affected memory    Medications Reviewed Today     Reviewed by Debbora Dus, [facility] (Pharmacist) on 08/05/20 at 1410  Med List Status: <None>   Medication Order Taking? Sig Documenting Provider Last Dose Status Informant  Calcium Carbonate-Vitamin D (CALCIUM-VITAMIN D) 500-200 MG-UNIT per tablet [id number] Yes Take 1 tablet by mouth daily. [provider] Taking Active Self  famotidine (PEPCID) 40 MG tablet [id number] No Take 1 tablet (40 mg total) by mouth daily.  Patient not taking: Reported on 08/05/2020   Abner Greenspan, MD Not Taking Active   FLUoxetine (PROZAC) 40 MG capsule [id number] Yes Take 1 capsule (40 mg total) by mouth daily. Tower, Wynelle Fanny, MD Taking Active   fluticasone [facility]) 50 MCG/ACT nasal spray [id number] Yes  Place 2 sprays into both nostrils daily.  Patient taking differently: Place 2 sprays into both nostrils daily as needed for allergies.   Tower, Wynelle Fanny, MD Taking Active Self  hydrochlorothiazide (MICROZIDE) 12.5 MG capsule [id number] Yes Take 1 capsule (12.5 mg total) by mouth daily. Tower, Wynelle Fanny, MD Taking Active   Multiple Vitamins-Minerals (MULTIVITAMIN PO) [id number] Yes Take 1 tablet by mouth daily. [provider] Taking Active Self  pantoprazole (PROTONIX) 40 MG tablet [id number] Yes Take 1 tablet (40 mg total) by mouth 2 (two) times daily. Tower, Wynelle Fanny, MD Taking Active   pravastatin (PRAVACHOL) 80 MG tablet [id number] Yes Take 1 tablet (80 mg total) by mouth daily. Tower, Wynelle Fanny, MD Taking Active   Tetrahydrozoline HCl [facility]) [id number] Yes Place 1 drop into both eyes as needed (for dry eyes). [provider] Taking Active Self  traZODone (DESYREL) 50 MG tablet [id number] Yes TAKE 1/2 TO 1 TABLET AT BEDTIME AS NEEDED FOR SLEEP Tower, Wynelle Fanny, MD Taking Active             Patient Active Problem List   Diagnosis Date Noted   Family history of colon cancer 05/07/2020   Cervical disc disease 08/19/2019   Right hand paresthesia 08/18/2019   Right arm pain 08/18/2019   Right shoulder pain 08/18/2019   Atypical chest pain 10/14/2018   Dysphagia 10/14/2018   Allergic rhinitis 09/03/2017   Insomnia 11/27/2016   Daytime somnolence 05/23/2016   Snoring 05/23/2016   Estrogen deficiency 10/19/2015   Need for hepatitis C screening test 09/27/2015   S/P right THA, AA 03/30/2015   Pre-operative examination 02/24/2015   S/P lumbar laminectomy 09/23/2014   Essential hypertension 06/26/2014   Encounter for Medicare annual wellness exam 06/03/2014   Colon cancer screening 06/03/2014   Sciatica 11/26/2013   Controlled type 2 diabetes mellitus without complication, without long-term current use of  insulin ([place]) 04/17/2013   Hirsutism 12/06/2010   Routine general  medical examination at a health care facility 09/10/2010   Other screening mammogram 09/01/2010   Osteopenia 10/22/2006   URINARY INCONTINENCE, STRESS 25/85/2778   History of Helicobacter pylori infection 04/18/2006   Hyperlipidemia associated with type 2 diabetes mellitus ([place]) 04/18/2006   Depression with anxiety 04/18/2006   EXTERNAL HEMORRHOIDS 04/18/2006   GERD 04/18/2006   HIATAL HERNIA 04/18/2006   OVERACTIVE BLADDER 04/18/2006   HEMATURIA, MICROSCOPIC 04/18/2006   PANCREATITIS, HX OF 04/18/2006   DIVERTICULOSIS, COLON 01/02/2000    Immunization History  Administered Date(s) Administered   Fluad Quad(high Dose 65+) 12/31/2019   Influenza Split 11/17/2010, 12/14/2011   Influenza Whole 12/07/2006, 11/05/2007, 11/10/2008, 01/05/2010   Influenza,inj,Quad PF,6+ Mos 12/17/2012, 12/16/2013, 12/31/2014, 10/15/2015, 11/15/2016, 11/29/2017, 09/24/2018   PFIZER(Purple Top)SARS-COV-2 Vaccination 02/18/2019, 03/11/2019   Pneumococcal Conjugate-13 06/03/2014   Pneumococcal Polysaccharide-23 10/15/2015   Td 10/01/1995, 10/04/2006   Zoster, Live 09/29/2013    Conditions to be addressed/monitored:  Hypertension, Hyperlipidemia, and Depression  Care Plan : [place]  Updates made by Debbora Dus, [place] since 08/05/2020 12:00 AM     Problem: CHL AMB "PATIENT-SPECIFIC PROBLEM"      Long-Range Goal: Disease Management   Start Date: 08/05/2020  Priority: High  Note:     Current Barriers:  None identified   Pharmacist Clinical Goal(s):  Patient will contact provider office for questions/concerns as evidenced notation of same in electronic health record through collaboration with PharmD and provider.   Interventions: 1:1 collaboration with Tower, Wynelle Fanny, MD regarding development and update of comprehensive plan of care as evidenced by provider attestation and co-signature Inter-disciplinary care team collaboration (see longitudinal plan of care) Comprehensive medication  review performed; medication list updated in electronic medical record  Hypertension (BP goal <140/90) -Controlled - clinic reading are within goal -Current treatment: HCTZ 12.5 mg - 1 capsule weekly -Medications previously tried: none  -Wears CPAP every night  -Current home readings: she has a home monitor but does not check routinely  -Current dietary habits: limits portions -Current exercise habits: recent weight loss of 35 lbs over the past 6-8 months, she walks for exercise and does some home exercises -Denies hypotensive/hypertensive symptoms -Educated on BP goals and benefits of medications for prevention of heart attack, stroke and kidney damage; -Counseled to monitor BP at home with symptoms of dizziness, document, and provide log at future appointments -Recommended to continue current medication  Hyperlipidemia: (LDL goal < 100) -Controlled - LDL 91 -Current treatment: Pravastatin 80 mg - 1 tablet daily -Medications previously tried: none  -Educated on Cholesterol goals;  -Recommended to continue current medication  Depression/Anxiety (Goal: Control symptoms) -Controlled  -She worries about her husband's health some, but overall mood is stable. Reports she has been on the fluoxetine a long time. She weaned off once and realized how helpful it had been. Trazodone helps her rest at night.  -Current treatment: Fluoxetine 40 mg - 1 capsule daily Trazodone 50 mg - 1/2 to 1 tablet at bedtime as needed -Medications previously tried/failed: none -Educated on Benefits of medication for symptom control Benefits of cognitive-behavioral therapy with or without medication -Recommended to continue current medication  GERD (Goal: Acid reflux) -Not ideally controlled, recently referred to GI -She has not schedule GI visit yet due to travel, caring for husband but plans to schedule soon. Reflux symptoms have been better lately. She is still having some breakthrough symptoms at  bedtime. -Current treatment  Pantoprazole  40 mg - 1 tablet twice daily  Famotidine 40 mg - 1 tablet daily as needed (not taking) She takes an occasional Tums  -Medications previously tried: none   -Recommended to continue current medication; Try taking the pantoprazole 30 minutes before supper instead of after.  Patient Goals/Self-Care Activities Patient will:  - take medications as prescribed - focus on medication adherence by using pillbox  Follow Up Plan: Telephone follow up appointment with care management team member scheduled for:  12 months      Medication Assistance: None required.  Patient affirms current coverage meets needs.  Compliance/Adherence/Medication fill history: Care Gaps: Last annual wellness visit: 04/30/20 She plans to schedule colonoscopy, mammogram and eye exam soon  Star-Rating Drugs: Medication:                Last Fill:         Day Supply Pravastatin 80m        05/07/20              90  [facility] [place] [id number]: [phone]: [phone] Uses pill box? Yes Pt endorses 100% compliance She takes HCTZ and pantoprazole every morning.  All others in the evening after supper - fluoxetine, 1 tab trazodone, pravastatin, pantoprazole  OTC - MVI, B complex with vitamin C, and calcium/vitamin D  We discussed: Current pharmacy is preferred with insurance plan and patient is satisfied with pharmacy services Patient decided to: Continue current medication management strategy  Care Plan and Follow Up Patient Decision:  Patient agrees to Care Plan and Follow-up.  MDebbora Dus PharmD Clinical Pharmacist [phone]

## 2020-08-05 NOTE — Patient Instructions (Signed)
August 05, 2020  Dear [name],  It was a pleasure meeting you during our initial appointment on August 05, 2020. Below is a summary of the goals we discussed and components of chronic care management. Please contact me anytime with questions or concerns.   Visit Information  Patient Care Plan: CCM Pharmacy Care Plan     Problem Identified: CHL AMB "PATIENT-SPECIFIC PROBLEM"      Long-Range Goal: Disease Management   Start Date: 08/05/2020  Priority: High  Note:     Current Barriers:  None identified   Pharmacist Clinical Goal(s):  Patient will contact provider office for questions/concerns as evidenced notation of same in electronic health record through collaboration with PharmD and provider.   Interventions: 1:1 collaboration with Tower, Wynelle Fanny, MD regarding development and update of comprehensive plan of care as evidenced by provider attestation and co-signature Inter-disciplinary care team collaboration (see longitudinal plan of care) Comprehensive medication review performed; medication list updated in electronic medical record  Hypertension (BP goal <140/90) -Controlled - clinic reading are within goal -Current treatment: HCTZ 12.5 mg - 1 capsule weekly -Medications previously tried: none  -Wears CPAP every night  -Current home readings: she has a home monitor but does not check routinely  -Current dietary habits: limits portions -Current exercise habits: recent weight loss of 35 lbs over the past 6-8 months, she walks for exercise and does some home exercises -Denies hypotensive/hypertensive symptoms -Educated on BP goals and benefits of medications for prevention of heart attack, stroke and kidney damage; -Counseled to monitor BP at home with symptoms of dizziness, document, and provide log at future appointments -Recommended to continue current medication  Hyperlipidemia: (LDL goal < 100) -Controlled - LDL 91 -Current treatment: Pravastatin 80 mg - 1 tablet  daily -Medications previously tried: none  -Educated on Cholesterol goals;  -Recommended to continue current medication  Depression/Anxiety (Goal: Control symptoms) -Controlled  -She worries about her husband's health some, but overall mood is stable. Reports she has been on the fluoxetine a long time. She weaned off once and realized how helpful it had been. Trazodone helps her rest at night.  -Current treatment: Fluoxetine 40 mg - 1 capsule daily Trazodone 50 mg - 1/2 to 1 tablet at bedtime as needed -Medications previously tried/failed: none -Educated on Benefits of medication for symptom control Benefits of cognitive-behavioral therapy with or without medication -Recommended to continue current medication  GERD (Goal: Acid reflux) -Not ideally controlled, recently referred to GI -She has not schedule GI visit yet due to travel, caring for husband but plans to schedule soon. Reflux symptoms have been better lately. She is still having some breakthrough symptoms at bedtime. -Current treatment  Pantoprazole 40 mg - 1 tablet twice daily  Famotidine 40 mg - 1 tablet daily as needed (not taking) She takes an occasional Tums  -Medications previously tried: none   -Recommended to continue current medication; Try taking the pantoprazole 30 minutes before supper instead of after.  Patient Goals/Self-Care Activities Patient will:  - take medications as prescribed - focus on medication adherence by using pillbox  Follow Up Plan: Telephone follow up appointment with care management team member scheduled for:  12 months      Patient verbalizes understanding of instructions provided today and agrees to view in [place].   Debbora Dus, PharmD Clinical Pharmacist [facility] [phone]'

## 2020-08-16 DIAGNOSIS — S92425A Nondisplaced fracture of distal phalanx of left great toe, initial encounter for closed fracture: Secondary | ICD-10-CM | POA: Diagnosis not present

## 2020-08-16 DIAGNOSIS — S91119A Laceration without foreign body of unspecified toe without damage to nail, initial encounter: Secondary | ICD-10-CM | POA: Diagnosis not present

## 2020-08-23 ENCOUNTER — Encounter: Payer: Self-pay | Admitting: Gastroenterology

## 2020-09-23 ENCOUNTER — Other Ambulatory Visit: Payer: Self-pay | Admitting: Family Medicine

## 2020-09-23 DIAGNOSIS — Z1231 Encounter for screening mammogram for malignant neoplasm of breast: Secondary | ICD-10-CM

## 2020-09-29 ENCOUNTER — Ambulatory Visit: Payer: Medicare HMO | Admitting: Gastroenterology

## 2020-09-29 ENCOUNTER — Encounter: Payer: Self-pay | Admitting: Gastroenterology

## 2020-09-29 DIAGNOSIS — Z800 Family history of malignant neoplasm of digestive organs: Secondary | ICD-10-CM | POA: Diagnosis not present

## 2020-09-29 DIAGNOSIS — R0789 Other chest pain: Secondary | ICD-10-CM | POA: Diagnosis not present

## 2020-09-29 DIAGNOSIS — K219 Gastro-esophageal reflux disease without esophagitis: Secondary | ICD-10-CM

## 2020-09-29 MED ORDER — NA SULFATE-K SULFATE-MG SULF 17.5-3.13-1.6 GM/177ML PO SOLN: 1.0000 | ORAL | 0 refills | Status: DC

## 2020-09-29 MED ORDER — FAMOTIDINE 40 MG PO TABS: 40.0000 mg | ORAL_TABLET | Freq: Every day | ORAL | 3 refills | Status: DC

## 2020-09-29 NOTE — Progress Notes (Signed)
09/29/2020 Connie Rowe [id number] [birth year]   HISTORY OF PRESENT ILLNESS: This is a 70 year old female is a patient [name].  She is here today to discuss an EGD and colonoscopy.  Her last colonoscopy was in January 2017 as listed below and she is on a 5-year plan due to family history of colon cancer in her mother.  She would also like to have another EGD.  Her last one was in September 2020.  She has been taking pantoprazole 40 mg twice daily and was taking Pepcid at bedtime, but ran out of that so she has not been taking that recently.  Despite that she still has some issues with heartburn/reflux and some intermittent discomfort in her chest that she feels is acid reflux related.  She also reports that she has been having a lot of gas.  She has occasional diarrhea, but other times has hard stools.  She denies seeing any blood in her stools.  She denies any vomiting.  She cannot necessarily identify certain foods that seem to trigger her to have an episode of diarrhea.   Colonoscopy 01/2015:  1. Melanosis coli was found in the transverse colon, ascending colon, and at the cecum; multiple biopsies were performed using cold forceps 2. There was mild diverticulosis noted in the sigmoid colon 3. Small internal hemorrhoids 4.small benign-appearing sessile polyp removed from sigmoid colon.  1. Surgical [P], random sites - MELANOSIS COLI; OTHERWISE HISTOLOGICALLY NORMAL COLONIC MUCOSA. - NEGATIVE FOR MORPHOLOGIC FEATURES OF IDIOPATHIC INFLAMMATORY BOWEL DISEASE, MICROSCOPIC COLITIS OR INFECTIOUS COLITIS. - NEGATIVE FOR DYSPLASIA. 2. Surgical [P], sigmoid, polyp - HYPERPLASTIC POLYP (X1).  EGD 10/2018:  - Z-line regular, 38 cm from the incisors. - LA Grade A reflux esophagitis. Biopsied. - Gastritis. Biopsied. - A single gastric polyp. Resected and retrieved. - Normal examined duodenum.  1. Surgical [P], gastric antrum and gastric body - ANTRAL AND OXYNTIC MUCOSA WITH  MILD CHRONIC GASTRITIS. - WARTHIN-STARRY NEGATIVE FOR HELICOBACTER PYLORI. - NO INTESTINAL METAPLASIA, DYSPLASIA OR CARCINOMA. 2. Surgical [P], gastric polyp - HYPERPLASTIC GASTRIC POLYP WITH FOCAL EROSION. Connie Rowe NEGATIVE FOR HELICOBACTER PYLORI. - NO INTESTINAL METAPLASIA, ADENOMATOUS CHANGE OR CARCINOMA. 3. Surgical [P], distal esophagus - SQUAMOUS MUCOSA WITH MILD CHANGES OF REFLUX. - PAS NEGATIVE FOR FUNGUS. - NO INTESTINAL METAPLASIA, DYSPLASIA OR MALIGNANCY. 4. Surgical [P], proximal esophagus - SQUAMOUS MUCOSA WITH NO SIGNIFICANT PATHOLOGIC CHANGES. - NO EOSINOPHILIC ESOPHAGITIS (LESS THAN 5 PER HIGH POWER FIELD). - PAS NEGATIVE FOR FUNGUS.   Past Medical History:  Diagnosis Date   Arthritis    Colon polyps    Depression    Esophageal stricture    Falls    GERD (gastroesophageal reflux disease)    H/O fracture of humerus 2006   History of bronchitis    History of hiatal hernia    Hyperlipidemia    Hypertension    Nocturia    Osteopenia    Pancreatitis    History of   Past Surgical History:  Procedure Laterality Date   BLADDER REPAIR     COLONOSCOPY     DIAGNOSTIC LAPAROSCOPY     tubal ligation   DILATION AND CURETTAGE OF UTERUS     ESOPHAGEAL DILATION     LUMBAR LAMINECTOMY/DECOMPRESSION MICRODISCECTOMY Right 09/23/2014   Procedure: Laminectomy and Foraminotomy - Lumbar three- lumbar four - right ;  Surgeon: Eustace Moore, MD;  Location: [facility];  Service: Neurosurgery;  Laterality: Right;   MULTIPLE TOOTH EXTRACTIONS  TONSILLECTOMY     TOTAL HIP ARTHROPLASTY Right 03/30/2015   Procedure: RIGHT TOTAL HIP ARTHROPLASTY ANTERIOR APPROACH;  Surgeon: Paralee Cancel, MD;  Location: [facility];  Service: Orthopedics;  Laterality: Right;   TOTAL HIP ARTHROPLASTY Left 01/09/2017   Procedure: LEFT TOTAL HIP ARTHROPLASTY ANTERIOR APPROACH;  Surgeon: Paralee Cancel, MD;  Location: [facility];  Service: Orthopedics;  Laterality: Left;  70 mins   TUBAL LIGATION      UPPER GI ENDOSCOPY      reports that she has never smoked. She has never used smokeless tobacco. She reports that she does not drink alcohol and does not use drugs. family history includes Colon cancer in her mother; Drug abuse in her son; Hypertension in her mother; Stroke in her brother and mother. Allergies  Allergen Reactions   Ace Inhibitors Cough   Ambien [Zolpidem Tartrate] Other (See Comments)    Affected memory      Outpatient Encounter Medications as of 09/29/2020  Medication Sig   Calcium Carbonate-Vitamin D (CALCIUM-VITAMIN D) 500-200 MG-UNIT per tablet Take 1 tablet by mouth daily.   famotidine (PEPCID) 40 MG tablet Take 1 tablet (40 mg total) by mouth daily.   FLUoxetine (PROZAC) 40 MG capsule Take 1 capsule (40 mg total) by mouth daily.   fluticasone (FLONASE) 50 MCG/ACT nasal spray Place 2 sprays into both nostrils daily. (Patient taking differently: Place 2 sprays into both nostrils daily as needed for allergies.)   hydrochlorothiazide (MICROZIDE) 12.5 MG capsule Take 1 capsule (12.5 mg total) by mouth daily.   Multiple Vitamins-Minerals (MULTIVITAMIN PO) Take 1 tablet by mouth daily.   pantoprazole (PROTONIX) 40 MG tablet Take 1 tablet (40 mg total) by mouth 2 (two) times daily.   pravastatin (PRAVACHOL) 80 MG tablet Take 1 tablet (80 mg total) by mouth daily.   Tetrahydrozoline HCl (VISINE OP) Place 1 drop into both eyes as needed (for dry eyes).   traZODone (DESYREL) 50 MG tablet TAKE 1/2 TO 1 TABLET AT BEDTIME AS NEEDED FOR SLEEP   No facility-administered encounter medications on file as of 09/29/2020.     REVIEW OF SYSTEMS  : All other systems reviewed and negative except where noted in the History of Present Illness.   PHYSICAL EXAM: BP 128/82   Pulse 71   Ht '5\' 4"'$  (1.626 m)   Wt 153 lb (69.4 kg)   SpO2 98%   BMI 26.26 kg/m  General: Well developed white female in no acute distress Head: Normocephalic and atraumatic Eyes:  Sclerae anicteric, conjunctiva  pink. Ears: Normal auditory acuity Lungs: Clear throughout to auscultation; no W/R/R. Heart: Regular rate and rhythm; no M/R/G. Abdomen: Soft, non-distended.  BS present.  Non-tender. Rectal:  Will be done at the time of colonoscopy. Musculoskeletal: Symmetrical with no gross deformities  Skin: No lesions on visible extremities Extremities: No edema  Neurological: Alert oriented x 4, grossly non-focal Psychological:  Alert and cooperative. Normal mood and affect  ASSESSMENT AND PLAN: *Family history of colon cancer in her mother:  Last colonoscopy was 01/2015.  Will schedule colonoscopy with Dr. Silverio Decamp.   *Atypical chest pain and GERD: Reports heartburn/reflux and some discomfort in her chest despite pantoprazole 40 mg twice daily.  She is also on Pepcid 40 mg at bedtime, but ran out of that so has not been taking it recently.  I will send a new prescription for that to her pharmacy.  We will plan for another EGD with Dr. Silverio Decamp as well.  We discussed trying IBgard for  her gas and she was given samples, question if maybe she would do better with FD guard instead, but we will try IBgard for now.  **The risks, benefits, and alternatives to EGD and colonoscopy were discussed with the patient and she consents to proceed.   CC:  Tower, Wynelle Fanny, MD

## 2020-09-29 NOTE — Patient Instructions (Addendum)
If you are age 72 or older, your body mass index should be between 23-30. Your Body mass index is 26.26 kg/m. If this is out of the aforementioned range listed, please consider follow up with your Primary Care Provider. __________________________________________________________  The [facility] providers would like to encourage you to use [facility] to communicate with providers for non-urgent requests or questions.  Due to long hold times on the telephone, sending your provider a message by [facility] may be a faster and more efficient way to get a response.  Please allow 48 business hours for a response.  Please remember that this is for non-urgent requests.   You have been scheduled for an endoscopy and colonoscopy. Please follow the written instructions given to you at your visit today. Please pick up your prep supplies at the pharmacy within the next 1-3 days. If you use inhalers (even only as needed), please bring them with you on the day of your procedure.  Due to recent changes in healthcare laws, you may see the results of your imaging and laboratory studies on MyChart before your provider has had a chance to review them.  We understand that in some cases there may be results that are confusing or concerning to you. Not all laboratory results come back in the same time frame and the provider may be waiting for multiple results in order to interpret others.  Please give [place] 48 hours in order for your provider to thoroughly review all the results before contacting the office for clarification of your results.   We have sent the following medications to your pharmacy for you to pick up at your convenience:  Pepcid '40mg'$  take one tablet at bedtime each night.  Ibgard samples given today.  Thank you for entrusting me with your care and choosing [facility].  Alonza Bogus, PA-C

## 2020-09-30 NOTE — Progress Notes (Signed)
Reviewed and agree with documentation and assessment and plan. K. Veena  , MD   

## 2020-10-14 ENCOUNTER — Other Ambulatory Visit: Payer: Self-pay

## 2020-10-14 ENCOUNTER — Ambulatory Visit
Admission: RE | Admit: 2020-10-14 | Discharge: 2020-10-14 | Disposition: A | Discharge status: Home / Self Care | Payer: Medicare HMO | Source: Ambulatory Visit | Attending: Family Medicine | Admitting: Family Medicine

## 2020-10-14 DIAGNOSIS — Z1231 Encounter for screening mammogram for malignant neoplasm of breast: Secondary | ICD-10-CM | POA: Diagnosis not present

## 2020-10-29 DIAGNOSIS — E7800 Pure hypercholesterolemia, unspecified: Secondary | ICD-10-CM | POA: Diagnosis not present

## 2020-10-29 DIAGNOSIS — Z0100 Encounter for examination of eyes and vision without abnormal findings: Secondary | ICD-10-CM | POA: Diagnosis not present

## 2020-10-29 DIAGNOSIS — I10 Essential (primary) hypertension: Secondary | ICD-10-CM | POA: Diagnosis not present

## 2020-12-06 ENCOUNTER — Encounter: Payer: Self-pay | Admitting: Gastroenterology

## 2020-12-06 ENCOUNTER — Other Ambulatory Visit: Payer: Self-pay

## 2020-12-06 ENCOUNTER — Ambulatory Visit (AMBULATORY_SURGERY_CENTER): Payer: Medicare HMO | Admitting: Gastroenterology

## 2020-12-06 ENCOUNTER — Telehealth: Payer: Self-pay | Admitting: Gastroenterology

## 2020-12-06 VITALS — BP 126/66 | HR 61 | Temp 97.8°F | Resp 11 | Ht 64.0 in | Wt 153.0 lb

## 2020-12-06 DIAGNOSIS — Z1211 Encounter for screening for malignant neoplasm of colon: Secondary | ICD-10-CM

## 2020-12-06 DIAGNOSIS — Z800 Family history of malignant neoplasm of digestive organs: Secondary | ICD-10-CM

## 2020-12-06 DIAGNOSIS — K2100 Gastro-esophageal reflux disease with esophagitis, without bleeding: Secondary | ICD-10-CM | POA: Diagnosis not present

## 2020-12-06 DIAGNOSIS — K2970 Gastritis, unspecified, without bleeding: Secondary | ICD-10-CM

## 2020-12-06 DIAGNOSIS — K219 Gastro-esophageal reflux disease without esophagitis: Secondary | ICD-10-CM | POA: Diagnosis not present

## 2020-12-06 DIAGNOSIS — K2990 Gastroduodenitis, unspecified, without bleeding: Secondary | ICD-10-CM | POA: Diagnosis not present

## 2020-12-06 DIAGNOSIS — K2950 Unspecified chronic gastritis without bleeding: Secondary | ICD-10-CM | POA: Diagnosis not present

## 2020-12-06 DIAGNOSIS — R0789 Other chest pain: Secondary | ICD-10-CM | POA: Diagnosis not present

## 2020-12-06 MED ORDER — SUCRALFATE 1 G PO TABS: 1.0000 g | ORAL_TABLET | Freq: Three times a day (TID) | ORAL | 0 refills | Status: DC | PRN

## 2020-12-06 MED ORDER — LANSOPRAZOLE 30 MG PO CPDR: 30.0000 mg | DELAYED_RELEASE_CAPSULE | Freq: Every day | ORAL | 3 refills | Status: DC

## 2020-12-06 MED ORDER — SODIUM CHLORIDE 0.9 % IV SOLN: 500.0000 mL | Freq: Once | INTRAVENOUS | Status: DC

## 2020-12-06 NOTE — Op Note (Signed)
[place] Patient Name: Connie Rowe Procedure Date: 12/06/2020 7:20 AM MRN: [id number] Endoscopist: Mauri Pole , MD Age: 70 Referring MD:  Date of Birth: 1950/04/01 Gender: Female Account #: [id number] Procedure:                Upper GI endoscopy Indications:              Esophageal reflux symptoms that persist despite                            appropriate therapy Medicines:                Monitored Anesthesia Care Procedure:                Pre-Anesthesia Assessment:                           - Prior to the procedure, a History and Physical                            was performed, and patient medications and                            allergies were reviewed. The patient's tolerance of                            previous anesthesia was also reviewed. The risks                            and benefits of the procedure and the sedation                            options and risks were discussed with the patient.                            All questions were answered, and informed consent                            was obtained. Prior Anticoagulants: The patient has                            taken no previous anticoagulant or antiplatelet                            agents. ASA Grade Assessment: II - A patient with                            mild systemic disease. After reviewing the risks                            and benefits, the patient was deemed in                            satisfactory condition to undergo the procedure.  After obtaining informed consent, the endoscope was                            passed under direct vision. Throughout the                            procedure, the patient's blood pressure, pulse, and                            oxygen saturations were monitored continuously. The                            GIF [id number] was introduced through the                            mouth, and advanced to the second  part of duodenum.                            The upper GI endoscopy was accomplished without                            difficulty. The patient tolerated the procedure                            well. Scope In: Scope Out: Findings:                 LA Grade A (one or more mucosal breaks less than 5                            mm, not extending between tops of 2 mucosal folds)                            esophagitis with no bleeding was found 34 to 35 cm                            from the incisors.                           The gastroesophageal flap valve was visualized                            endoscopically and classified as Hill Grade II                            (fold present, opens with respiration).                           Patchy mild inflammation characterized by                            congestion (edema) and erythema was found in the                            entire  examined stomach. Biopsies were taken with a                            cold forceps for Helicobacter pylori testing.                           The cardia and gastric fundus were normal on                            retroflexion.                           The examined duodenum was normal. Complications:            No immediate complications. Estimated Blood Loss:     Estimated blood loss was minimal. Impression:               - LA Grade A reflux esophagitis with no bleeding.                           - Gastroesophageal flap valve classified as Hill                            Grade II (fold present, opens with respiration).                           - Gastritis. Biopsied.                           - Normal examined duodenum. Recommendation:           - Patient has a contact number available for                            emergencies. The signs and symptoms of potential                            delayed complications were discussed with the                            patient. Return to normal activities  tomorrow.                            Written discharge instructions were provided to the                            patient.                           - Resume previous diet.                           - Continue present medications.                           - Await pathology results.                           -  Follow an antireflux regimen.                           - Use Prevacid (lansoprazole) 30 mg PO daily, 30                            mins before breakfast. Please send 90 day supply                            with 3 refills                           - Carafate 1gm before meals as needed X 30 days (90                            tabs)                           - Follow up in GI office visit in 2-3 months with                            APP or Dr. Mauri Pole, MD 12/06/2020 8:52:11 AM This report has been signed electronically.

## 2020-12-06 NOTE — Progress Notes (Signed)
Called to room to assist during endoscopic procedure.  Patient ID and intended procedure confirmed with present staff. Received instructions for my participation in the procedure from the performing physician.  

## 2020-12-06 NOTE — Progress Notes (Signed)
VS-DT 

## 2020-12-06 NOTE — Telephone Encounter (Signed)
Inbound call from pharmacy stating that the medication Sucralfate they are out of stock. They do have the liquid form if you can send a prescription in for that. Husband is waiting at the pharmacy. Please advise. Thank you.

## 2020-12-06 NOTE — Patient Instructions (Signed)
Handouts given for diverticulosis and gastritis.  YOU HAD AN ENDOSCOPIC PROCEDURE TODAY AT [facility]:   Refer to the procedure report that was given to you for any specific questions about what was found during the examination.  If the procedure report does not answer your questions, please call your gastroenterologist to clarify.  If you requested that your care partner not be given the details of your procedure findings, then the procedure report has been included in a sealed envelope for you to review at your convenience later.  YOU SHOULD EXPECT: Some feelings of bloating in the abdomen. Passage of more gas than usual.  Walking can help get rid of the air that was put into your GI tract during the procedure and reduce the bloating. If you had a lower endoscopy (such as a colonoscopy or flexible sigmoidoscopy) you may notice spotting of blood in your stool or on the toilet paper. If you underwent a bowel prep for your procedure, you may not have a normal bowel movement for a few days.  Please Note:  You might notice some irritation and congestion in your nose or some drainage.  This is from the oxygen used during your procedure.  There is no need for concern and it should clear up in a day or so.  SYMPTOMS TO REPORT IMMEDIATELY:  Following lower endoscopy (colonoscopy):  Excessive amounts of blood in the stool  Significant tenderness or worsening of abdominal pains  Swelling of the abdomen that is new, acute  Fever of 100F or higher  Following upper endoscopy (EGD)  Vomiting of blood or coffee ground material  New chest pain or pain under the shoulder blades  Painful or persistently difficult swallowing  New shortness of breath  Black, tarry-looking stools  For urgent or emergent issues, a gastroenterologist can be reached at any hour by calling [phone]. Do not use MyChart messaging for urgent concerns.    DIET:  We do recommend a small meal at first, but then  you may proceed to your regular diet.  Drink plenty of fluids but you should avoid alcoholic beverages for 24 hours.  ACTIVITY:  You should plan to take it easy for the rest of today and you should NOT DRIVE or use heavy machinery until tomorrow (because of the sedation medicines used during the test).    FOLLOW UP: Our staff will call the number listed on your records 48-72 hours following your procedure to check on you and address any questions or concerns that you may have regarding the information given to you following your procedure. If we do not reach you, we will leave a message.  We will attempt to reach you two times.  During this call, we will ask if you have developed any symptoms of COVID 19. If you develop any symptoms (ie: fever, flu-like symptoms, shortness of breath, cough etc.) before then, please call [phone].  If you test positive for Covid 19 in the 2 weeks post procedure, please call and report this information to [place].    If any biopsies were taken you will be contacted by phone or by letter within the next 1-3 weeks.  Please call us at [phone] if you have not heard about the biopsies in 3 weeks.    SIGNATURES/CONFIDENTIALITY: You and/or your care partner have signed paperwork which will be entered into your electronic medical record.  These signatures attest to the fact that that the information above on your After Visit Summary  has been reviewed and is understood.  Full responsibility of the confidentiality of this discharge information lies with you and/or your care-partner.  

## 2020-12-06 NOTE — Op Note (Signed)
[facility] Patient Name: Connie Rowe Procedure Date: 12/06/2020 7:20 AM MRN: [id number] Endoscopist: Mauri Pole , MD Age: 70 Referring MD:  Date of Birth: [birth year] Gender: Female Account #: [id number] Procedure:                Colonoscopy Indications:              Colon cancer screening in patient at increased                            risk: Colorectal cancer in mother Medicines:                Monitored Anesthesia Care Procedure:                Pre-Anesthesia Assessment:                           - Prior to the procedure, a History and Physical                            was performed, and patient medications and                            allergies were reviewed. The patient's tolerance of                            previous anesthesia was also reviewed. The risks                            and benefits of the procedure and the sedation                            options and risks were discussed with the patient.                            All questions were answered, and informed consent                            was obtained. Prior Anticoagulants: The patient has                            taken no previous anticoagulant or antiplatelet                            agents. ASA Grade Assessment: II - A patient with                            mild systemic disease. After reviewing the risks                            and benefits, the patient was deemed in                            satisfactory condition to undergo the procedure.  After obtaining informed consent, the colonoscope                            was passed under direct vision. Throughout the                            procedure, the patient's blood pressure, pulse, and                            oxygen saturations were monitored continuously. The                            PCF-HQ190L Colonoscope was introduced through the                            anus and advanced to the  the cecum, identified by                            appendiceal orifice and ileocecal valve. The                            colonoscopy was performed without difficulty. The                            patient tolerated the procedure well. The quality                            of the bowel preparation was adequate. The                            ileocecal valve, appendiceal orifice, and rectum                            were photographed. Scope In: 8:21:49 AM Scope Out: 8:34:47 AM Scope Withdrawal Time: 0 hours 8 minutes 31 seconds  Total Procedure Duration: 0 hours 12 minutes 58 seconds  Findings:                 The perianal and digital rectal examinations were                            normal.                           Scattered small and large-mouthed diverticula were                            found in the sigmoid colon, descending colon,                            transverse colon and ascending colon.                           Non-bleeding internal hemorrhoids were found during  retroflexion. The hemorrhoids were large.                           The exam was otherwise normal throughout the                            examined colon. Complications:            No immediate complications. Estimated Blood Loss:     Estimated blood loss was minimal. Impression:               - Diverticulosis in the sigmoid colon, in the                            descending colon, in the transverse colon and in                            the ascending colon.                           - Non-bleeding internal hemorrhoids.                           - No specimens collected. Recommendation:           - Patient has a contact number available for                            emergencies. The signs and symptoms of potential                            delayed complications were discussed with the                            patient. Return to normal activities tomorrow.                             Written discharge instructions were provided to the                            patient.                           - Resume previous diet.                           - Continue present medications.                           - Repeat colonoscopy in 5 years for screening                            purposes. Mauri Pole, MD 12/06/2020 8:42:28 AM This report has been signed electronically.

## 2020-12-06 NOTE — Progress Notes (Signed)
[facility] History and Physical   Primary Care Physician:  Tower, Wynelle Fanny, MD   Reason for Procedure:  Family history of colon cancer, gerd, atypical chest pain despite PPI  Plan:    EGD and Screening colonoscopy with possible interventions as needed     HPI: Connie Rowe is a very pleasant 70 y.o. female here for EGD and colonoscopy. Denies any nausea, vomiting, abdominal pain, melena or bright red blood per rectum  The risks and benefits as well as alternatives of endoscopic procedure(s) have been discussed and reviewed. All questions answered. The patient agrees to proceed.    Past Medical History:  Diagnosis Date   Allergy    Anxiety    Arthritis    Colon polyps    Depression    Esophageal stricture    Falls    GERD (gastroesophageal reflux disease)    H/O fracture of humerus 01/31/2004   History of bronchitis    History of hiatal hernia    Hyperlipidemia    Hypertension    Nocturia    Osteopenia    Pancreatitis    History of   Sleep apnea    uses CPAP    Past Surgical History:  Procedure Laterality Date   BLADDER REPAIR     COLONOSCOPY     DIAGNOSTIC LAPAROSCOPY     tubal ligation   DILATION AND CURETTAGE OF UTERUS     ESOPHAGEAL DILATION     LUMBAR LAMINECTOMY/DECOMPRESSION MICRODISCECTOMY Right 09/23/2014   Procedure: Laminectomy and Foraminotomy - Lumbar three- lumbar four - right ;  Surgeon: Eustace Moore, MD;  Location: [facility];  Service: Neurosurgery;  Laterality: Right;   MULTIPLE TOOTH EXTRACTIONS     TONSILLECTOMY     TOTAL HIP ARTHROPLASTY Right 03/30/2015   Procedure: RIGHT TOTAL HIP ARTHROPLASTY ANTERIOR APPROACH;  Surgeon: Paralee Cancel, MD;  Location: [facility];  Service: Orthopedics;  Laterality: Right;   TOTAL HIP ARTHROPLASTY Left 01/09/2017   Procedure: LEFT TOTAL HIP ARTHROPLASTY ANTERIOR APPROACH;  Surgeon: Paralee Cancel, MD;  Location: [facility];  Service: Orthopedics;  Laterality: Left;  70 mins   TUBAL LIGATION      UPPER GASTROINTESTINAL ENDOSCOPY     UPPER GI ENDOSCOPY      Prior to Admission medications   Medication Sig Start Date End Date Taking? Authorizing Provider  Calcium Carbonate-Vitamin D (CALCIUM-VITAMIN D) 500-200 MG-UNIT per tablet Take 1 tablet by mouth daily.   Yes [provider]  famotidine (PEPCID) 40 MG tablet Take 1 tablet (40 mg total) by mouth at bedtime. 09/29/20  Yes Zehr, Laban Emperor, PA-C  FLUoxetine (PROZAC) 40 MG capsule Take 1 capsule (40 mg total) by mouth daily. 05/07/20  Yes Tower, Wynelle Fanny, MD  fluticasone (FLONASE) 50 MCG/ACT nasal spray Place 2 sprays into both nostrils daily. Patient taking differently: Place 2 sprays into both nostrils daily as needed for allergies. 05/07/20  Yes Tower, Wynelle Fanny, MD  hydrochlorothiazide (MICROZIDE) 12.5 MG capsule Take 1 capsule (12.5 mg total) by mouth daily. 05/07/20  Yes Tower, Wynelle Fanny, MD  pantoprazole (PROTONIX) 40 MG tablet Take 1 tablet (40 mg total) by mouth 2 (two) times daily. 05/07/20  Yes Tower, Wynelle Fanny, MD  pravastatin (PRAVACHOL) 80 MG tablet Take 1 tablet (80 mg total) by mouth daily. 05/07/20  Yes Tower, Wynelle Fanny, MD  traZODone (DESYREL) 50 MG tablet TAKE 1/2 TO 1 TABLET AT BEDTIME AS NEEDED FOR SLEEP 05/07/20  Yes Tower, Wynelle Fanny, MD  docusate  sodium (COLACE) 100 MG capsule Colace 100 mg capsule   100 mg by oral route. 01/09/17   [provider]  meloxicam (MOBIC) 15 MG tablet meloxicam 15 mg tablet   15 mg by oral route. Patient not taking: Reported on 12/06/2020 08/18/19   [provider]  Multiple Vitamins-Minerals (MULTIVITAMIN PO) Take 1 tablet by mouth daily.    [provider]  Tetrahydrozoline HCl (VISINE OP) Place 1 drop into both eyes as needed (for dry eyes).    [provider]    Current Outpatient Medications  Medication Sig Dispense Refill   Calcium Carbonate-Vitamin D (CALCIUM-VITAMIN D) 500-200 MG-UNIT per tablet Take 1 tablet by mouth daily.     famotidine (PEPCID) 40 MG  tablet Take 1 tablet (40 mg total) by mouth at bedtime. 30 tablet 3   FLUoxetine (PROZAC) 40 MG capsule Take 1 capsule (40 mg total) by mouth daily. 90 capsule 3   fluticasone (FLONASE) 50 MCG/ACT nasal spray Place 2 sprays into both nostrils daily. (Patient taking differently: Place 2 sprays into both nostrils daily as needed for allergies.) 48 g 3   hydrochlorothiazide (MICROZIDE) 12.5 MG capsule Take 1 capsule (12.5 mg total) by mouth daily. 90 capsule 3   pantoprazole (PROTONIX) 40 MG tablet Take 1 tablet (40 mg total) by mouth 2 (two) times daily. 180 tablet 3   pravastatin (PRAVACHOL) 80 MG tablet Take 1 tablet (80 mg total) by mouth daily. 90 tablet 3   traZODone (DESYREL) 50 MG tablet TAKE 1/2 TO 1 TABLET AT BEDTIME AS NEEDED FOR SLEEP 90 tablet 3   docusate sodium (COLACE) 100 MG capsule Colace 100 mg capsule   100 mg by oral route.     meloxicam (MOBIC) 15 MG tablet meloxicam 15 mg tablet   15 mg by oral route. (Patient not taking: Reported on 12/06/2020)     Multiple Vitamins-Minerals (MULTIVITAMIN PO) Take 1 tablet by mouth daily.     Tetrahydrozoline HCl (VISINE OP) Place 1 drop into both eyes as needed (for dry eyes).     Current Facility-Administered Medications  Medication Dose Route Frequency Provider Last Rate Last Admin   0.9 %  sodium chloride infusion  500 mL Intravenous Once Mauri Pole, MD        Allergies as of 12/06/2020 - Review Complete 12/06/2020  Allergen Reaction Noted   Ace inhibitors Cough 09/18/2014   Ambien [zolpidem tartrate] Other (See Comments) 11/15/2016    Family History  Problem Relation Age of Onset   Stroke Mother    Hypertension Mother    Colon cancer Mother    Stroke Brother    Drug abuse Son    Colon polyps Neg Hx    Crohn's disease Neg Hx    Pancreatic cancer Neg Hx    Rectal cancer Neg Hx    Stomach cancer Neg Hx    Ulcerative colitis Neg Hx    Breast cancer Neg Hx    Esophageal cancer Neg Hx     Social History    Socioeconomic History   Marital status: Married    Spouse name: Not on file   Number of children: Not on file   Years of education: Not on file   Highest education level: Not on file  Occupational History   Not on file  Tobacco Use   Smoking status: Never   Smokeless tobacco: Never  Vaping Use   Vaping Use: Never used  Substance and Sexual Activity   Alcohol use: Yes  Comment: very rarely   Drug use: No   Sexual activity: Yes  Other Topics Concern   Not on file  Social History Narrative   Not on file   Social Determinants of Health   Financial Resource Strain: Low Risk    Difficulty of Paying Living Expenses: Not very hard  Food Insecurity: No Food Insecurity   Worried About Running Out of Food in the Last Year: Never true   Ran Out of Food in the Last Year: Never true  Transportation Needs: No Transportation Needs   Lack of Transportation (Medical): No   Lack of Transportation (Non-Medical): No  Physical Activity: Insufficiently Active   Days of Exercise per Week: 3 days   Minutes of Exercise per Session: 20 min  Stress: No Stress Concern Present   Feeling of Stress : Not at all  Social Connections: Not on file  Intimate Partner Violence: Not At Risk   Fear of Current or Ex-Partner: No   Emotionally Abused: No   Physically Abused: No   Sexually Abused: No    Review of Systems:  All other review of systems negative except as mentioned in the HPI.  Physical Exam: Vital signs in last 24 hours: BP 136/67   Pulse 69   Temp 97.8 F (36.6 C) (Temporal)   Ht 5\' 4"  (1.626 m)   Wt 153 lb (69.4 kg)   SpO2 95%   BMI 26.26 kg/m     General:   Alert, NAD Lungs:  Clear .   Heart:  Regular rate and rhythm Abdomen:  Soft, nontender and nondistended. Neuro/Psych:  Alert and cooperative. Normal mood and affect. A and O x 3  Reviewed labs, radiology imaging, old records and pertinent past GI work up  Patient is appropriate for planned procedure(s) and  anesthesia in an ambulatory setting   K. Denzil Magnuson , MD [phone]

## 2020-12-07 MED ORDER — SUCRALFATE 1 GM/10ML PO SUSP: 1.0000 g | Freq: Three times a day (TID) | ORAL | 2 refills | Status: DC

## 2020-12-07 NOTE — Telephone Encounter (Signed)
Liquid Carafate sent to pharmacy

## 2020-12-08 ENCOUNTER — Telehealth: Payer: Self-pay

## 2020-12-08 ENCOUNTER — Telehealth: Payer: Self-pay | Admitting: *Deleted

## 2020-12-08 NOTE — Telephone Encounter (Signed)
Left message on answering machine. 

## 2020-12-08 NOTE — Telephone Encounter (Signed)
Message left

## 2020-12-10 ENCOUNTER — Telehealth: Payer: Self-pay

## 2020-12-10 NOTE — Chronic Care Management (AMB) (Addendum)
Chronic Care Management Pharmacy Assistant   Name: Connie Rowe  MRN: [id number] DOB: Jan 26, 1951  Reason for Encounter: Hypertension Disease State    Recent office visits:  None since last CCM contact  Recent consult visits:  12/06/20-Gastroenterology-Patient presented for colonoscopy and endoscopy.Use prevacid 30mg  1 tablet daily and Carafate 1gm before meals as needed for 1 month, follow up 2-3 months. 10/29/20-Optometry- No data found 09/28/20-Gastroenterology-Patient presented to discuss procedure.Ordered pepcid 40mg  take1 tablet bedtime,Ibgard samples given. 08/16/20-OrthopedicsPatient presented for left great toe fracture-no data found  Hospital visits:  None in previous 6 months  Medications: Outpatient Encounter Medications as of 12/10/2020  Medication Sig   Calcium Carbonate-Vitamin D (CALCIUM-VITAMIN D) 500-200 MG-UNIT per tablet Take 1 tablet by mouth daily.   docusate sodium (COLACE) 100 MG capsule Colace 100 mg capsule   100 mg by oral route.   famotidine (PEPCID) 40 MG tablet Take 1 tablet (40 mg total) by mouth at bedtime.   FLUoxetine (PROZAC) 40 MG capsule Take 1 capsule (40 mg total) by mouth daily.   fluticasone (FLONASE) 50 MCG/ACT nasal spray Place 2 sprays into both nostrils daily. (Patient taking differently: Place 2 sprays into both nostrils daily as needed for allergies.)   hydrochlorothiazide (MICROZIDE) 12.5 MG capsule Take 1 capsule (12.5 mg total) by mouth daily.   lansoprazole (PREVACID) 30 MG capsule Take 1 capsule (30 mg total) by mouth daily before breakfast. Take 30 minutes before breakfast   meloxicam (MOBIC) 15 MG tablet meloxicam 15 mg tablet   15 mg by oral route. (Patient not taking: Reported on 12/06/2020)   Multiple Vitamins-Minerals (MULTIVITAMIN PO) Take 1 tablet by mouth daily.   pravastatin (PRAVACHOL) 80 MG tablet Take 1 tablet (80 mg total) by mouth daily.   sucralfate (CARAFATE) 1 g tablet Take 1 tablet (1 g total) by mouth 3  (three) times daily as needed (before meals and as needed).   sucralfate (CARAFATE) 1 GM/10ML suspension Take 10 mLs (1 g total) by mouth in the morning, at noon, and at bedtime.   Tetrahydrozoline HCl (VISINE OP) Place 1 drop into both eyes as needed (for dry eyes).   traZODone (DESYREL) 50 MG tablet TAKE 1/2 TO 1 TABLET AT BEDTIME AS NEEDED FOR SLEEP   No facility-administered encounter medications on file as of 12/10/2020.    Recent Office Vitals: BP Readings from Last 3 Encounters:  12/06/20 126/66  09/29/20 128/82  05/07/20 118/70   Pulse Readings from Last 3 Encounters:  12/06/20 61  09/29/20 71  05/07/20 79    Wt Readings from Last 3 Encounters:  12/06/20 153 lb (69.4 kg)  09/29/20 153 lb (69.4 kg)  05/07/20 143 lb 7 oz (65.1 kg)     Kidney Function Lab Results  Component Value Date/Time   CREATININE 1.01 04/30/2020 09:21 AM   CREATININE 1.07 03/28/2019 08:47 AM   GFR 55.91 (L) 04/30/2020 09:21 AM   GFRNONAA >60 01/10/2017 05:59 AM   GFRAA >60 01/10/2017 05:59 AM    BMP Latest Ref Rng & Units 04/30/2020 03/28/2019 09/19/2018  Glucose 70 - 99 mg/dL 94 118(H) 114(H)  BUN 6 - 23 mg/dL 18 15 12   Creatinine 0.40 - 1.20 mg/dL 1.01 1.07 1.05  Sodium 135 - 145 mEq/L 141 139 140  Potassium 3.5 - 5.1 mEq/L 4.0 3.8 3.8  Chloride 96 - 112 mEq/L 102 101 102  CO2 19 - 32 mEq/L 32 32 30  Calcium 8.4 - 10.5 mg/dL 9.5 9.7 9.5  Contacted patient on 12/10/20 to discuss hypertension disease state  Current antihypertensive regimen:   HCTZ 12.5 mg - 1 capsule daily   Patient verbally confirms she is taking the above medications as directed. Yes  How often are you checking your Blood Pressure?  The patient reports she does not monitor her BP at home Salt intake: The patient reports limits adding to food OTC medications including pseudoephedrine or NSAIDs? no   What recent interventions/DTPs have been made by any provider to improve Blood Pressure control since last CPP Visit:  none indentified  Any recent hospitalizations or ED visits since last visit with CPP? No  What diet changes have been made to improve Blood Pressure Control?   The patient reports cutting out fried foods, limits salt intake  What exercise is being done to improve your Blood Pressure Control?   Walks for exercise daily   Adherence Review: Is the patient currently on ACE/ARB medication? No Does the patient have >5 day gap between last estimated fill dates? No   Star Rating Drugs:  Medication:  Last Fill: Day Supply Pravastatin 80mg  10/12/20 90  Care Gaps: Annual wellness visit in last year? Yes Most Recent BP reading: 136/67  69-P  12/06/20  No appointments scheduled within the next 30 days.   Debbora Dus, CPP notified  [name], [facility] Assistant [phone]  I have reviewed the care management and care coordination activities outlined in this encounter and I am certifying that I agree with the content of this note. No further action required.  Debbora Dus, PharmD Clinical Pharmacist [facility] [phone]

## 2020-12-30 ENCOUNTER — Encounter: Payer: Self-pay | Admitting: Gastroenterology

## 2021-01-04 ENCOUNTER — Other Ambulatory Visit: Payer: Self-pay

## 2021-01-04 ENCOUNTER — Ambulatory Visit (INDEPENDENT_AMBULATORY_CARE_PROVIDER_SITE_OTHER): Payer: Medicare HMO

## 2021-01-04 DIAGNOSIS — Z23 Encounter for immunization: Secondary | ICD-10-CM | POA: Diagnosis not present

## 2021-02-10 DIAGNOSIS — Z6828 Body mass index (BMI) 28.0-28.9, adult: Secondary | ICD-10-CM | POA: Diagnosis not present

## 2021-02-10 DIAGNOSIS — E119 Type 2 diabetes mellitus without complications: Secondary | ICD-10-CM | POA: Diagnosis not present

## 2021-02-10 DIAGNOSIS — I10 Essential (primary) hypertension: Secondary | ICD-10-CM | POA: Diagnosis not present

## 2021-02-10 DIAGNOSIS — F329 Major depressive disorder, single episode, unspecified: Secondary | ICD-10-CM | POA: Diagnosis not present

## 2021-02-10 DIAGNOSIS — K219 Gastro-esophageal reflux disease without esophagitis: Secondary | ICD-10-CM | POA: Diagnosis not present

## 2021-02-11 DIAGNOSIS — E7800 Pure hypercholesterolemia, unspecified: Secondary | ICD-10-CM | POA: Diagnosis not present

## 2021-02-11 DIAGNOSIS — E559 Vitamin D deficiency, unspecified: Secondary | ICD-10-CM | POA: Diagnosis not present

## 2021-02-11 DIAGNOSIS — E039 Hypothyroidism, unspecified: Secondary | ICD-10-CM | POA: Diagnosis not present

## 2021-02-11 DIAGNOSIS — E119 Type 2 diabetes mellitus without complications: Secondary | ICD-10-CM | POA: Diagnosis not present

## 2021-02-24 DIAGNOSIS — Z23 Encounter for immunization: Secondary | ICD-10-CM | POA: Diagnosis not present

## 2021-02-24 DIAGNOSIS — E538 Deficiency of other specified B group vitamins: Secondary | ICD-10-CM | POA: Diagnosis not present

## 2021-02-24 DIAGNOSIS — Z6828 Body mass index (BMI) 28.0-28.9, adult: Secondary | ICD-10-CM | POA: Diagnosis not present

## 2021-02-24 DIAGNOSIS — E118 Type 2 diabetes mellitus with unspecified complications: Secondary | ICD-10-CM | POA: Diagnosis not present

## 2021-02-24 DIAGNOSIS — I10 Essential (primary) hypertension: Secondary | ICD-10-CM | POA: Diagnosis not present

## 2021-03-21 ENCOUNTER — Telehealth: Payer: Self-pay

## 2021-03-21 NOTE — Chronic Care Management (AMB) (Addendum)
Chronic Care Management Pharmacy Assistant   Name: Connie Rowe  MRN: [id number] DOB: [birth year]   Reason for Encounter: Cholesterol Disease State    Recent office visits:  01/04/21-Family Medicine- Patient presented for flu shot.  Recent consult visits:  02/10/21-Jennifer Stann Mainland, NP- no data found   Hospital visits:  None in previous 6 months  Medications: Outpatient Encounter Medications as of 03/21/2021  Medication Sig   Calcium Carbonate-Vitamin D (CALCIUM-VITAMIN D) 500-200 MG-UNIT per tablet Take 1 tablet by mouth daily.   docusate sodium (COLACE) 100 MG capsule Colace 100 mg capsule   100 mg by oral route.   famotidine (PEPCID) 40 MG tablet Take 1 tablet (40 mg total) by mouth at bedtime.   FLUoxetine (PROZAC) 40 MG capsule Take 1 capsule (40 mg total) by mouth daily.   fluticasone (FLONASE) 50 MCG/ACT nasal spray Place 2 sprays into both nostrils daily. (Patient taking differently: Place 2 sprays into both nostrils daily as needed for allergies.)   hydrochlorothiazide (MICROZIDE) 12.5 MG capsule Take 1 capsule (12.5 mg total) by mouth daily.   lansoprazole (PREVACID) 30 MG capsule Take 1 capsule (30 mg total) by mouth daily before breakfast. Take 30 minutes before breakfast   meloxicam (MOBIC) 15 MG tablet meloxicam 15 mg tablet   15 mg by oral route. (Patient not taking: Reported on 12/06/2020)   Multiple Vitamins-Minerals (MULTIVITAMIN PO) Take 1 tablet by mouth daily.   pravastatin (PRAVACHOL) 80 MG tablet Take 1 tablet (80 mg total) by mouth daily.   sucralfate (CARAFATE) 1 g tablet Take 1 tablet (1 g total) by mouth 3 (three) times daily as needed (before meals and as needed).   sucralfate (CARAFATE) 1 GM/10ML suspension Take 10 mLs (1 g total) by mouth in the morning, at noon, and at bedtime.   Tetrahydrozoline HCl (VISINE OP) Place 1 drop into both eyes as needed (for dry eyes).   traZODone (DESYREL) 50 MG tablet TAKE 1/2 TO 1 TABLET AT BEDTIME AS NEEDED FOR  SLEEP   No facility-administered encounter medications on file as of 03/21/2021.   03/21/2021 Name: Connie Rowe MRN: [id number] DOB: 1950-08-19 [name] is a 71 y.o. year old female who is a primary care patient of Tower, Wynelle Fanny, MD.  Comprehensive medication review performed; Spoke to patient regarding cholesterol.   Contacted patient on 03/28/21 to discuss hyperlipidemia.  Lipid Panel    Component Value Date/Time   CHOL 193 04/30/2020 0921   TRIG 161.0 (H) 04/30/2020 0921   HDL 69.40 04/30/2020 0921   LDLCALC 91 04/30/2020 0921   LDLDIRECT 126.0 06/03/2014 1843    10-year ASCVD risk score: The 10-year ASCVD risk score (Arnett DK, et al., 2019) is: 26.3%   Values used to calculate the score:     Age: 68 years     Sex: Female     Is Non-Hispanic African American: No     Diabetic: Yes     Tobacco smoker: No     Systolic Blood Pressure: 782 mmHg     Is BP treated: Yes     HDL Cholesterol: 69.4 mg/dL     Total Cholesterol: 193 mg/dL  Current antihyperlipidemic regimen:   Pravastatin 80mg -take 1 tablet daily (supper meal) Previous antihyperlipidemic medications tried: none  ASCVD risk enhancing conditions: age >58, DM, and HTN  What recent interventions/DTPs have been made by any provider to improve Cholesterol control since last CPP Visit:  none identified  Any recent hospitalizations or ED visits since  last visit with CPP? No  What diet changes have been made to improve Cholesterol?   No special diet  What exercise is being done to improve Cholesterol? The patient reports she does home exercises and walks for exercise when weather permits   Adherence Review: Does the patient have >5 day gap between last estimated fill dates? No  Annual wellness visit in last year? Yes Most Recent BP reading:128/82  71-P 09/29/20   Upcoming appointments: No appointments scheduled within the next 30 days.  Star Rating Drugs Medication Name/Dose: Last fill date Days  supply Pravastatin 80mg   03/12/21 [place] ,Painter CPP notified  [name], [place] Assistant [phone]  Total time spent for month CPA: 30 min

## 2021-03-24 ENCOUNTER — Other Ambulatory Visit: Payer: Self-pay | Admitting: Adult Health

## 2021-03-24 DIAGNOSIS — I10 Essential (primary) hypertension: Secondary | ICD-10-CM | POA: Diagnosis not present

## 2021-03-24 DIAGNOSIS — Z13820 Encounter for screening for osteoporosis: Secondary | ICD-10-CM

## 2021-03-24 DIAGNOSIS — Z6828 Body mass index (BMI) 28.0-28.9, adult: Secondary | ICD-10-CM | POA: Diagnosis not present

## 2021-03-24 DIAGNOSIS — E119 Type 2 diabetes mellitus without complications: Secondary | ICD-10-CM | POA: Diagnosis not present

## 2021-04-07 DIAGNOSIS — Z0000 Encounter for general adult medical examination without abnormal findings: Secondary | ICD-10-CM | POA: Diagnosis not present

## 2021-04-07 DIAGNOSIS — I10 Essential (primary) hypertension: Secondary | ICD-10-CM | POA: Diagnosis not present

## 2021-04-07 DIAGNOSIS — K219 Gastro-esophageal reflux disease without esophagitis: Secondary | ICD-10-CM | POA: Diagnosis not present

## 2021-04-07 DIAGNOSIS — Z6828 Body mass index (BMI) 28.0-28.9, adult: Secondary | ICD-10-CM | POA: Diagnosis not present

## 2021-04-07 DIAGNOSIS — F329 Major depressive disorder, single episode, unspecified: Secondary | ICD-10-CM | POA: Diagnosis not present

## 2021-04-07 DIAGNOSIS — E119 Type 2 diabetes mellitus without complications: Secondary | ICD-10-CM | POA: Diagnosis not present

## 2021-04-07 DIAGNOSIS — E669 Obesity, unspecified: Secondary | ICD-10-CM | POA: Diagnosis not present

## 2021-04-07 DIAGNOSIS — Z1331 Encounter for screening for depression: Secondary | ICD-10-CM | POA: Diagnosis not present

## 2021-04-07 DIAGNOSIS — Z1389 Encounter for screening for other disorder: Secondary | ICD-10-CM | POA: Diagnosis not present

## 2021-04-21 DIAGNOSIS — E118 Type 2 diabetes mellitus with unspecified complications: Secondary | ICD-10-CM | POA: Diagnosis not present

## 2021-04-21 DIAGNOSIS — Z6828 Body mass index (BMI) 28.0-28.9, adult: Secondary | ICD-10-CM | POA: Diagnosis not present

## 2021-04-21 DIAGNOSIS — Z713 Dietary counseling and surveillance: Secondary | ICD-10-CM | POA: Diagnosis not present

## 2021-04-21 DIAGNOSIS — I10 Essential (primary) hypertension: Secondary | ICD-10-CM | POA: Diagnosis not present

## 2021-05-02 ENCOUNTER — Ambulatory Visit: Payer: Medicare HMO

## 2021-05-16 ENCOUNTER — Ambulatory Visit
Admission: RE | Admit: 2021-05-16 | Discharge: 2021-05-16 | Disposition: A | Discharge status: Home / Self Care | Payer: Medicare HMO | Source: Ambulatory Visit | Attending: Adult Health | Admitting: Adult Health

## 2021-05-16 ENCOUNTER — Other Ambulatory Visit: Payer: Self-pay | Admitting: Adult Health

## 2021-05-16 DIAGNOSIS — Z13820 Encounter for screening for osteoporosis: Secondary | ICD-10-CM | POA: Diagnosis present

## 2021-05-16 DIAGNOSIS — E119 Type 2 diabetes mellitus without complications: Secondary | ICD-10-CM | POA: Diagnosis not present

## 2021-05-16 DIAGNOSIS — Z780 Asymptomatic menopausal state: Secondary | ICD-10-CM | POA: Diagnosis not present

## 2021-06-13 ENCOUNTER — Telehealth: Payer: Medicare HMO

## 2021-08-03 ENCOUNTER — Telehealth: Payer: Self-pay

## 2021-08-03 NOTE — Chronic Care Management (AMB) (Signed)
    Chronic Care Management Pharmacy Assistant   Name: Connie Rowe  MRN: [id number] DOB: [birth year]  Reason for Encounter: Reminder Call   Recent office visits:  None since last CCM contact   Recent consult visits:  05/16/21- Dexa Scan  04/21/21-Connie Stann Mainland, NP- no data found  12/06/20- Connie Nandigam,MD(gastro)- Upper GI endoscopy and colonoscopy 10/29/20-Connie Rowe (optom)- no data found 09/29/20-Connie Zehr,PA (gastro)-discuss colonoscopy and reflux,start famotidine '40mg'$  take 1 tablet at bedtime,discussed Ibgard and samples given.   Hospital visits:  None in previous 6 months  Medications: Outpatient Encounter Medications as of 08/03/2021  Medication Sig   Calcium Carbonate-Vitamin D (CALCIUM-VITAMIN D) 500-200 MG-UNIT per tablet Take 1 tablet by mouth daily.   docusate sodium (COLACE) 100 MG capsule Colace 100 mg capsule   100 mg by oral route.   DROPLET PEN NEEDLES 32G X 4 MM MISC    famotidine (PEPCID) 40 MG tablet Take 1 tablet (40 mg total) by mouth at bedtime.   FLUoxetine (PROZAC) 40 MG capsule Take 1 capsule (40 mg total) by mouth daily.   fluticasone (FLONASE) 50 MCG/ACT nasal spray Place 2 sprays into both nostrils daily. (Patient taking differently: Place 2 sprays into both nostrils daily as needed for allergies.)   hydrochlorothiazide (MICROZIDE) 12.5 MG capsule Take 1 capsule (12.5 mg total) by mouth daily.   lansoprazole (PREVACID) 30 MG capsule Take 1 capsule (30 mg total) by mouth daily before breakfast. Take 30 minutes before breakfast   meloxicam (MOBIC) 15 MG tablet meloxicam 15 mg tablet   15 mg by oral route. (Patient not taking: Reported on 12/06/2020)   Multiple Vitamins-Minerals (MULTIVITAMIN PO) Take 1 tablet by mouth daily.   OZEMPIC, 1 MG/DOSE, 4 MG/3ML SOPN Inject into the skin.   pravastatin (PRAVACHOL) 80 MG tablet Take 1 tablet (80 mg total) by mouth daily.   sucralfate (CARAFATE) 1 g tablet Take 1 tablet (1 g total) by mouth 3 (three)  times daily as needed (before meals and as needed).   sucralfate (CARAFATE) 1 GM/10ML suspension Take 10 mLs (1 g total) by mouth in the morning, at noon, and at bedtime.   Tetrahydrozoline HCl (VISINE OP) Place 1 drop into both eyes as needed (for dry eyes).   traZODone (DESYREL) 50 MG tablet TAKE 1/2 TO 1 TABLET AT BEDTIME AS NEEDED FOR SLEEP   No facility-administered encounter medications on file as of 08/03/2021.   Connie Rowe was contacted to remind of upcoming telephone visit with Charlene Brooke  on 08/08/21 at 2:15pm. Patient was reminded to have any blood glucose and blood pressure readings available for review at appointment.   Message was left reminding patient of appointment.   CCM referral has been placed prior to visit?  No    Star Rating Drugs: Medication:  Last Fill: Day Supply Pravastatin '80mg'$  03/12/21 90  [facility] mail order pharmacy  Ozempic '1mg'$    04/27/21 90 [facility] mail order     Charlene Brooke, CPP notified  [name], [place]  [phone]

## 2021-08-08 ENCOUNTER — Telehealth: Payer: Medicare HMO

## 2021-08-08 NOTE — Progress Notes (Deleted)
Chronic Care Management Pharmacy Note  08/08/2021 Name:  Connie Rowe MRN:  [id number] DOB:  [birth year]  Summary: ***  Recommendations/Changes made from today's visit: ***  Plan: ***   Subjective: Connie Rowe is an 71 y.o. year old female who is a primary patient of Gae Bon, NP.  The CCM team was consulted for assistance with disease management and care coordination needs.    Engaged with patient by telephone for follow up visit in response to provider referral for pharmacy case management and/or care coordination services.   Consent to Services:  The patient was given information about Chronic Care Management services, agreed to services, and gave verbal consent prior to initiation of services.  Please see initial visit note for detailed documentation.   Patient Care Team: Gae Bon, NP as PCP - General (Nurse Practitioner) Debbora Dus, [facility] as Pharmacist (Pharmacist)  Recent office visits: 05/07/20 Dr Glori Bickers [place]: annual exam  Recent consult visits: 05/16/21- Dexa Scan  04/21/21-Jennifer Stann Mainland, NP- no data found  12/06/20- K.Veena Nandigam,MD(gastro)- Upper GI endoscopy and colonoscopy 10/29/20-William Sherlean Foot (optom)- no data found 09/29/20-Jessica Zehr,PA (gastro)-discuss colonoscopy and reflux,start famotidine 78m take 1 tablet at bedtime,discussed Ibgard and samples given.  Hospital visits: {Hospital DC Yes/No:25215}   Objective:  Lab Results  Component Value Date   CREATININE 1.01 04/30/2020   BUN 18 04/30/2020   GFR 55.91 (L) 04/30/2020   GFRNONAA >60 01/10/2017   GFRAA >60 01/10/2017   NA 141 04/30/2020   K 4.0 04/30/2020   CALCIUM 9.5 04/30/2020   CO2 32 04/30/2020   GLUCOSE 94 04/30/2020    Lab Results  Component Value Date/Time   HGBA1C 5.5 04/30/2020 09:21 AM   HGBA1C 6.4 (H) 03/28/2019 08:47 AM   GFR 55.91 (L) 04/30/2020 09:21 AM   GFR 50.60 (L) 03/28/2019 08:47 AM   MICROALBUR 1.3 05/07/2020 09:10 AM   MICROALBUR 1.3  03/28/2019 08:56 AM    Last diabetic Eye exam:  Lab Results  Component Value Date/Time   HMDIABEYEEXA No Retinopathy 12/02/2018 12:00 AM    Last diabetic Foot exam: No results found for: "HMDIABFOOTEX"   Lab Results  Component Value Date   CHOL 193 04/30/2020   HDL 69.40 04/30/2020   LDLCALC 91 04/30/2020   LDLDIRECT 126.0 06/03/2014   TRIG 161.0 (H) 04/30/2020   CHOLHDL 3 04/30/2020       Latest Ref Rng & Units 04/30/2020    9:21 AM 03/28/2019    8:47 AM 09/19/2018    9:20 AM  Hepatic Function  Total Protein 6.0 - 8.3 g/dL 6.8  7.4  7.0   Albumin 3.5 - 5.2 g/dL 4.3  4.3  4.4   AST 0 - 37 U/L _0 ALT 0 - 35 U/L _1 Alk Phosphatase 39 - 117 U/L 55  59  59   Total Bilirubin 0.2 - 1.2 mg/dL 0.6  0.5  0.3     Lab Results  Component Value Date/Time   TSH 1.06 04/30/2020 09:21 AM   TSH 1.40 03/28/2019 08:47 AM       Latest Ref Rng & Units 04/30/2020    9:21 AM 03/28/2019    8:47 AM 11/29/2017   11:08 AM  CBC  WBC 4.0 - 10.5 K/uL 4.7  5.0  5.5   Hemoglobin 12.0 - 15.0 g/dL 13.7  13.3  13.5   Hematocrit 36.0 - 46.0 % 41.0  39.7  40.5  Platelets 150.0 - 400.0 K/uL 231.0  220.0  238.0     Lab Results  Component Value Date/Time   VD25OH 21.68 (L) 06/03/2014 06:43 PM   VD25OH 42 09/14/2010 10:51 AM   VD25OH 32 07/16/2008 09:31 PM    Clinical ASCVD: {YES/NO:21197} The 10-year ASCVD risk score (Arnett DK, et al., 2019) is: 29.3%   Values used to calculate the score:     Age: 82 years     Sex: Female     Is Non-Hispanic African American: No     Diabetic: Yes     Tobacco smoker: No     Systolic Blood Pressure: 333 mmHg     Is BP treated: Yes     HDL Cholesterol: 69.4 mg/dL     Total Cholesterol: 193 mg/dL       04/30/2020    9:14 AM 04/01/2019    9:49 AM 11/29/2017   11:41 AM  Depression screen PHQ 2/9  Decreased Interest 0 0 0  Down, Depressed, Hopeless 0 0 1  PHQ - 2 Score 0 0 1  Altered sleeping 0 2 0  Tired, decreased energy 0 0 0  Change  in appetite 0 3 0  Feeling bad or failure about yourself  0 0 0  Trouble concentrating 0 0 0  Moving slowly or fidgety/restless 0 0 0  Suicidal thoughts 0 0 0  PHQ-9 Score 0 5 1  Difficult doing work/chores Not difficult at all Not difficult at all Not difficult at all     ***Other: (CHADS2VASc if Afib, MMRC or CAT for COPD, ACT, DEXA)  Social History   Tobacco Use  Smoking Status Never  Smokeless Tobacco Never   BP Readings from Last 3 Encounters:  12/06/20 126/66  09/29/20 128/82  05/07/20 118/70   Pulse Readings from Last 3 Encounters:  12/06/20 61  09/29/20 71  05/07/20 79   Wt Readings from Last 3 Encounters:  12/06/20 153 lb (69.4 kg)  09/29/20 153 lb (69.4 kg)  05/07/20 143 lb 7 oz (65.1 kg)   BMI Readings from Last 3 Encounters:  12/06/20 26.26 kg/m  09/29/20 26.26 kg/m  05/07/20 24.81 kg/m    Assessment/Interventions: Review of patient past medical history, allergies, medications, health status, including review of consultants reports, laboratory and other test data, was performed as part of comprehensive evaluation and provision of chronic care management services.   SDOH:  (Social Determinants of Health) assessments and interventions performed: {yes/no:20286}  SDOH Screenings   Alcohol Screen: Low Risk  (04/30/2020)   Alcohol Screen    Last Alcohol Screening Score (AUDIT): 0  Depression (PHQ2-9): Low Risk  (04/30/2020)   Depression (PHQ2-9)    PHQ-2 Score: 0  Financial Resource Strain: Low Risk  (08/05/2020)   Overall Financial Resource Strain (CARDIA)    Difficulty of Paying Living Expenses: Not very hard  Food Insecurity: No Food Insecurity (04/30/2020)   Hunger Vital Sign    Worried About Running Out of Food in the Last Year: Never true    Ran Out of Food in the Last Year: Never true  Housing: Low Risk  (04/30/2020)   Housing    Last Housing Risk Score: 0  Physical Activity: Insufficiently Active (04/30/2020)   Exercise Vital Sign    Days of Exercise  per Week: 3 days    Minutes of Exercise per Session: 20 min  Social Connections: Not on file  Stress: No Stress Concern Present (04/30/2020)   [place]  Stress Questionnaire    Feeling of Stress : Not at all  Tobacco Use: Low Risk  (12/06/2020)   Patient History    Smoking Tobacco Use: Never    Smokeless Tobacco Use: Never    Passive Exposure: Not on file  Transportation Needs: No Transportation Needs (04/30/2020)   PRAPARE - Transportation    Lack of Transportation (Medical): No    Lack of Transportation (Non-Medical): No    CCM Care Plan  Allergies  Allergen Reactions   Ace Inhibitors Cough   Ambien [Zolpidem Tartrate] Other (See Comments)    Affected memory    Medications Reviewed Today     Reviewed by Mauri Pole, MD (Physician) on 12/06/20 at [facility] List Status: <None>   Medication Order Taking? Sig Documenting Provider Last Dose Status Informant  0.9 %  sodium chloride infusion [id number]   Mauri Pole, MD  Active   Calcium Carbonate-Vitamin D (CALCIUM-VITAMIN D) 500-200 MG-UNIT per tablet [id number] Yes Take 1 tablet by mouth daily. [provider] Past Week Active Self  docusate sodium (COLACE) 100 MG capsule [id number] No Colace 100 mg capsule   100 mg by oral route. [provider] Unknown Active   famotidine (PEPCID) 40 MG tablet [id number] Yes Take 1 tablet (40 mg total) by mouth at bedtime. Loralie Champagne, PA-C 12/05/2020 Active   FLUoxetine (PROZAC) 40 MG capsule [id number] Yes Take 1 capsule (40 mg total) by mouth daily. Tower, Wynelle Fanny, MD 12/05/2020 Active   fluticasone (FLONASE) 50 MCG/ACT nasal spray [id number] Yes Place 2 sprays into both nostrils daily.  Patient taking differently: Place 2 sprays into both nostrils daily as needed for allergies.   Tower, Wynelle Fanny, MD Past Week Active Self  hydrochlorothiazide (MICROZIDE) 12.5 MG capsule [id number] Yes Take 1 capsule (12.5 mg total) by mouth  daily. Tower, Wynelle Fanny, MD 12/05/2020 Active   meloxicam (MOBIC) 15 MG tablet [id number] No meloxicam 15 mg tablet   15 mg by oral route.  Patient not taking: Reported on 12/06/2020   [provider] Not Taking Active   Multiple Vitamins-Minerals (MULTIVITAMIN PO) [id number] No Take 1 tablet by mouth daily. [provider] Unknown Active Self  pantoprazole (PROTONIX) 40 MG tablet [id number] Yes Take 1 tablet (40 mg total) by mouth 2 (two) times daily. Tower, Wynelle Fanny, MD 12/05/2020 Active   pravastatin (PRAVACHOL) 80 MG tablet [id number] Yes Take 1 tablet (80 mg total) by mouth daily. Tower, Wynelle Fanny, MD 12/05/2020 Active   Tetrahydrozoline HCl (VISINE OP) [id number] No Place 1 drop into both eyes as needed (for dry eyes). [provider] Unknown Active Self  traZODone (DESYREL) 50 MG tablet [id number] Yes TAKE 1/2 TO 1 TABLET AT BEDTIME AS NEEDED FOR SLEEP Tower, Wynelle Fanny, MD 12/05/2020 Active             Patient Active Problem List   Diagnosis Date Noted   Family hx of colon cancer 05/07/2020   Cervical disc disease 08/19/2019   Right hand paresthesia 08/18/2019   Right arm pain 08/18/2019   Right shoulder pain 08/18/2019   Atypical chest pain 10/14/2018   Dysphagia 10/14/2018   Allergic rhinitis 09/03/2017   Insomnia 11/27/2016   Daytime somnolence 05/23/2016   Snoring 05/23/2016   Estrogen deficiency 10/19/2015   Need for hepatitis C screening test 09/27/2015   S/P right THA, AA 03/30/2015   Pre-operative examination 02/24/2015   S/P lumbar laminectomy 09/23/2014   Essential hypertension 06/26/2014   Encounter for Medicare  annual wellness exam 06/03/2014   Colon cancer screening 06/03/2014   Sciatica 11/26/2013   Controlled type 2 diabetes mellitus without complication, without long-term current use of insulin ([place]) 04/17/2013   Hirsutism 12/06/2010   Routine general medical examination at a health care facility 09/10/2010   Other screening mammogram  09/01/2010   Osteopenia 10/22/2006   URINARY INCONTINENCE, STRESS 48/54/6270   History of Helicobacter pylori infection 04/18/2006   Hyperlipidemia associated with type 2 diabetes mellitus ([place]) 04/18/2006   Depression with anxiety 04/18/2006   EXTERNAL HEMORRHOIDS 04/18/2006   GERD 04/18/2006   HIATAL HERNIA 04/18/2006   OVERACTIVE BLADDER 04/18/2006   HEMATURIA, MICROSCOPIC 04/18/2006   PANCREATITIS, HX OF 04/18/2006   DIVERTICULOSIS, COLON 01/02/2000    Immunization History  Administered Date(s) Administered   Fluad Quad(high Dose 65+) 12/31/2019, 01/04/2021   Influenza Split 11/17/2010, 12/14/2011   Influenza Whole 12/07/2006, 11/05/2007, 11/10/2008, 01/05/2010   Influenza,inj,Quad PF,6+ Mos 12/17/2012, 12/16/2013, 12/31/2014, 10/15/2015, 11/15/2016, 11/29/2017, 09/24/2018   PFIZER(Purple Top)SARS-COV-2 Vaccination 02/18/2019, 03/11/2019   Pneumococcal Conjugate-13 06/03/2014   Pneumococcal Polysaccharide-23 10/15/2015   Td 10/01/1995, 10/04/2006   Zoster, Live 09/29/2013    Conditions to be addressed/monitored:  {USCCMDZASSESSMENTOPTIONS:23563}  There are no care plans that you recently modified to display for this patient.    Medication Assistance: {MEDASSISTANCEINFO:25044}  Compliance/Adherence/Medication fill history: Care Gaps: ***  Star-Rating Drugs: ***  Patient's preferred pharmacy is:  [facility] [address] [place] [address] Phone: [phone] Fax: [facility], [place] [address] Phone: [phone] Fax: [phone]  Uses pill box? {Yes or If no, why not?:20788} Pt endorses ***% compliance  We discussed: {Pharmacy options:24294} Patient decided to: {US Pharmacy Plan:23885}  Care Plan and Follow Up Patient Decision:  {FOLLOWUP:24991}  Plan: {CM FOLLOW UP MPNT:61443}  ***      Current  Barriers:  None identified   Pharmacist Clinical Goal(s):  Patient will contact provider office for questions/concerns as evidenced notation of same in electronic health record through collaboration with PharmD and provider.  Current Barriers:  {pharmacybarriers:24917}  Pharmacist Clinical Goal(s):  Patient will {PHARMACYGOALCHOICES:24921} through collaboration with PharmD and provider.  Interventions: 1:1 collaboration with Tower, Wynelle Fanny, MD regarding development and update of comprehensive plan of care as evidenced by provider attestation and co-signature Inter-disciplinary care team collaboration (see longitudinal plan of care) Comprehensive medication review performed; medication list updated in electronic medical record  Hypertension (BP goal <140/90) -Controlled - clinic reading are within goal -Current treatment: HCTZ 12.5 mg - 1 capsule weekly -Medications previously tried: none  -Wears CPAP every night  -Current home readings: she has a home monitor but does not check routinely  -Current dietary habits: limits portions -Current exercise habits: recent weight loss of 35 lbs over the past 6-8 months, she walks for exercise and does some home exercises -Denies hypotensive/hypertensive symptoms -Educated on BP goals and benefits of medications for prevention of heart attack, stroke and kidney damage; -Counseled to monitor BP at home with symptoms of dizziness, document, and provide log at future appointments -Recommended to continue current medication  Hyperlipidemia: (LDL goal < 100) -Controlled - LDL 91 -Current treatment: Pravastatin 80 mg - 1 tablet daily -Medications previously tried: none  -Educated on Cholesterol goals;  -Recommended to continue current medication  Depression/Anxiety (Goal: Control symptoms) -Controlled  -She worries about her husband's health some, but overall mood is stable. Reports she  has been on the fluoxetine a long time. She weaned off once  and realized how helpful it had been. Trazodone helps her rest at night.  -Current treatment: Fluoxetine 40 mg - 1 capsule daily Trazodone 50 mg - 1/2 to 1 tablet at bedtime as needed -Medications previously tried/failed: none -Educated on Benefits of medication for symptom control Benefits of cognitive-behavioral therapy with or without medication -Recommended to continue current medication  GERD (Goal: Acid reflux) -Not ideally controlled, recently referred to GI -She has not schedule GI visit yet due to travel, caring for husband but plans to schedule soon. Reflux symptoms have been better lately. She is still having some breakthrough symptoms at bedtime. -Current treatment  Pantoprazole 40 mg - 1 tablet twice daily  Famotidine 40 mg - 1 tablet daily as needed (not taking) She takes an occasional Tums  -Medications previously tried: none   -Recommended to continue current medication; Try taking the pantoprazole 30 minutes before supper instead of after.   Interventions: 1:1 collaboration with Gae Bon, NP regarding development and update of comprehensive plan of care as evidenced by provider attestation and co-signature Inter-disciplinary care team collaboration (see longitudinal plan of care) Comprehensive medication review performed; medication list updated in electronic medical record  {CCM PHARMD DISEASE STATES:25130}  Patient Goals/Self-Care Activities Patient will:  - {pharmacypatientgoals:24919}

## 2021-08-08 NOTE — Telephone Encounter (Signed)
It appears patient has changed PCP to another practice. Unable to speak with patient to verify. Cancelled CCM appt.

## 2021-09-05 ENCOUNTER — Telehealth: Payer: Self-pay

## 2021-09-05 NOTE — Telephone Encounter (Signed)
Patient called office states that she transferred to another provider but wanted to come back to Dr. Glori Bickers. Wanted to know if we could schedule her back?

## 2021-09-05 NOTE — Telephone Encounter (Signed)
Is this okay?

## 2021-09-05 NOTE — Telephone Encounter (Signed)
That is fine as long as she is ok with a wait. Please schedule her for a f/u next month.

## 2021-09-06 NOTE — Telephone Encounter (Signed)
Can you please schedule patient.

## 2021-09-14 ENCOUNTER — Encounter: Payer: Self-pay | Admitting: Emergency Medicine

## 2021-09-14 ENCOUNTER — Other Ambulatory Visit: Payer: Self-pay

## 2021-09-14 ENCOUNTER — Emergency Department: Payer: Medicare HMO

## 2021-09-14 DIAGNOSIS — Z20822 Contact with and (suspected) exposure to covid-19: Secondary | ICD-10-CM | POA: Diagnosis not present

## 2021-09-14 DIAGNOSIS — F17210 Nicotine dependence, cigarettes, uncomplicated: Secondary | ICD-10-CM | POA: Diagnosis not present

## 2021-09-14 DIAGNOSIS — R630 Anorexia: Secondary | ICD-10-CM | POA: Insufficient documentation

## 2021-09-14 DIAGNOSIS — R052 Subacute cough: Secondary | ICD-10-CM | POA: Insufficient documentation

## 2021-09-14 DIAGNOSIS — R5383 Other fatigue: Secondary | ICD-10-CM | POA: Insufficient documentation

## 2021-09-14 LAB — COMPREHENSIVE METABOLIC PANEL
ALT: 16 U/L (ref 0–44)
AST: 16 U/L (ref 15–41)
Albumin: 3.8 g/dL (ref 3.5–5.0)
Alkaline Phosphatase: 52 U/L (ref 38–126)
Anion gap: 6 (ref 5–15)
BUN: 18 mg/dL (ref 8–23)
CO2: 31 mmol/L (ref 22–32)
Calcium: 9.2 mg/dL (ref 8.9–10.3)
Chloride: 100 mmol/L (ref 98–111)
Creatinine, Ser: 1.19 mg/dL — ABNORMAL HIGH (ref 0.44–1.00)
GFR, Estimated: 48 mL/min — ABNORMAL LOW (ref >60)
Glucose, Bld: 140 mg/dL — ABNORMAL HIGH (ref 70–99)
Potassium: 3.8 mmol/L (ref 3.5–5.1)
Sodium: 137 mmol/L (ref 135–145)
Total Bilirubin: 0.7 mg/dL (ref 0.3–1.2)
Total Protein: 6.7 g/dL (ref 6.5–8.1)

## 2021-09-14 LAB — TROPONIN I (HIGH SENSITIVITY)
Troponin I (High Sensitivity): 3 ng/L (ref <18)
Troponin I (High Sensitivity): 3 ng/L (ref <18)

## 2021-09-14 LAB — CBC
HCT: 43 % (ref 36.0–46.0)
Hemoglobin: 13.8 g/dL (ref 12.0–15.0)
MCH: 29.9 pg (ref 26.0–34.0)
MCHC: 32.1 g/dL (ref 30.0–36.0)
MCV: 93.1 fL (ref 80.0–100.0)
Platelets: 262 10*3/uL (ref 150–400)
RBC: 4.62 MIL/uL (ref 3.87–5.11)
RDW: 13.4 % (ref 11.5–15.5)
WBC: 7.9 10*3/uL (ref 4.0–10.5)
nRBC: 0 % (ref 0.0–0.2)

## 2021-09-14 LAB — SARS CORONAVIRUS 2 BY RT PCR: SARS Coronavirus 2 by RT PCR: NEGATIVE

## 2021-09-14 NOTE — ED Triage Notes (Signed)
Pt presents via pov with c/o cough for 4 weeks. PT reports was treated with amoxicillin and prednisone, but cough it still present. Pt cough is strong but non-productive. Pt endorses shob. Pt denies fevers, N/V/D.

## 2021-09-15 ENCOUNTER — Emergency Department
Admission: EM | Admit: 2021-09-15 | Discharge: 2021-09-15 | Disposition: A | Discharge status: Home / Self Care | Payer: Medicare HMO | Attending: Emergency Medicine | Admitting: Emergency Medicine

## 2021-09-15 ENCOUNTER — Emergency Department: Payer: Medicare HMO

## 2021-09-15 DIAGNOSIS — R058 Other specified cough: Secondary | ICD-10-CM

## 2021-09-15 DIAGNOSIS — R052 Subacute cough: Secondary | ICD-10-CM

## 2021-09-15 MED ORDER — SUCRALFATE 1 G PO TABS: 1.0000 g | ORAL_TABLET | Freq: Three times a day (TID) | ORAL | 1 refills | Status: DC

## 2021-09-15 MED ORDER — BENZONATATE 100 MG PO CAPS: 100.0000 mg | ORAL_CAPSULE | Freq: Once | ORAL | Status: DC

## 2021-09-15 MED ORDER — IPRATROPIUM-ALBUTEROL 0.5-2.5 (3) MG/3ML IN SOLN
3.0000 mL | Freq: Once | RESPIRATORY_TRACT | Status: AC
Start: 2021-09-15 — End: 2021-09-15
  Administered 2021-09-15: 3 mL via RESPIRATORY_TRACT
  Filled 2021-09-15: qty 3

## 2021-09-15 MED ORDER — IOHEXOL 350 MG/ML SOLN
60.0000 mL | Freq: Once | INTRAVENOUS | Status: AC | PRN
  Administered 2021-09-15: 60 mL via INTRAVENOUS

## 2021-09-15 NOTE — ED Provider Notes (Signed)
[facility] Provider Note    Event Date/Time   First MD Initiated Contact with Patient 09/15/21 0214     (approximate)   History   Cough   HPI  Connie Rowe is a 71 y.o. female who presents to the ED for evaluation of Cough   Patient presents to the ED for evaluation of 4 weeks of persistent nonproductive cough.  She reports poor sleep and significant annoyance due to this.  Reports her PCP has tried antibiotics and steroids and no improvement.  Patient reports many years of secondhand smoke exposure but no primary cigarette smoking.  She reports history of GERD on PPI.  Reports no productive cough, fever, syncope, dyspnea.   Medications from her PCP have not helped.  Fatigue and poor appetite without significant unintentional weight loss.   Physical Exam   Triage Vital Signs: ED Triage Vitals  Enc Vitals Group     BP 09/14/21 1957 132/70     Pulse Rate 09/14/21 1957 95     Resp 09/14/21 1957 18     Temp 09/14/21 1957 98.7 F (37.1 C)     Temp Source 09/14/21 1957 Oral     SpO2 09/14/21 1957 97 %     Weight 09/14/21 1957 160 lb (72.6 kg)     Height 09/14/21 1957 '5\' 3"'$  (1.6 m)     Head Circumference --      Peak Flow --      Pain Score 09/14/21 2007 0     Pain Loc --      Pain Edu? --      Excl. in [place]? --     Most recent vital signs: Vitals:   09/14/21 2301 09/15/21 0206  BP: 111/76 129/77  Pulse: 85 80  Resp: 16 17  Temp: 98.4 F (36.9 C)   SpO2: 92% 93%    General: Awake, no distress.  Pleasant and conversational full sentences, occasional nonproductive coughing fits. CV:  Good peripheral perfusion.  Resp:  Normal effort.  CTA B Abd:  No distention.  MSK:  No deformity noted.  Neuro:  No focal deficits appreciated. Other:  Posterior oropharynx is clear, uvula is midline, no erythema and tonsils without exudate or erythema   ED Results / Procedures / Treatments   Labs (all labs ordered are listed, but only abnormal  results are displayed) Labs Reviewed  COMPREHENSIVE METABOLIC PANEL - Abnormal; Notable for the following components:      Result Value   Glucose, Bld 140 (*)    Creatinine, Ser 1.19 (*)    GFR, Estimated 48 (*)    All other components within normal limits  SARS CORONAVIRUS 2 BY RT PCR  CBC  TROPONIN I (HIGH SENSITIVITY)  TROPONIN I (HIGH SENSITIVITY)    EKG Poor quality EKG seems to demonstrate a sinus rhythm with a rate of 83 bpm.  Rightward axis and likely normal intervals.  No clear signs of acute ischemia.  RADIOLOGY CXR interpreted by me without evidence of acute cardiopulmonary pathology.  Official radiology report(s): DG Chest 2 View  Result Date: 09/14/2021 CLINICAL DATA:  shob. cough for 4 weeks EXAM: CHEST - 2 VIEW COMPARISON:  Chest x-ray 09/15/2014 FINDINGS: The heart and mediastinal contours are within normal limits. Biapical pleural/pulmonary scarring. No focal consolidation. No pulmonary edema. No pleural effusion. No pneumothorax. No acute osseous abnormality. IMPRESSION: No active cardiopulmonary disease. Electronically Signed   By: Iven Finn M.D.   On: 09/14/2021 20:46  PROCEDURES and INTERVENTIONS:  Procedures  Medications - No data to display   IMPRESSION / MDM / [place] / ED COURSE  I reviewed the triage vital signs and the nursing notes.  Differential diagnosis includes, but is not limited to, COPD, GERD, lung mass, ACS, PE  {Patient presents with symptoms of an acute illness or injury that is potentially life-threatening.  71 year old female presents to the ED with subacute nonproductive cough, without evidence of any pathology and suitable for outpatient management.  She look systemically well.  Has clear lungs.  Her CXR is clear, vitals are in reassuring on room air and normal CBC/metabolic panel and troponins.  Negative for COVID.  CTA chest performed without evidence of PE, mass or further acute pathology.  I see no barriers to  outpatient management.  We discussed return precautions.  Clinical Course as of 09/15/21 0338  Thu Sep 15, 2021  0336 Reassessed.  Feeling a little bit better after the breathing treatment.  We also discussed possible GI causes and she is agreeable to empiric addition of cervical fate to her regimen on top of her PPI.  Discussed return precautions. [DS]    Clinical Course User Index [DS] Vladimir Crofts, MD     FINAL CLINICAL IMPRESSION(S) / ED DIAGNOSES   Final diagnoses:  None     Rx / DC Orders   ED Discharge Orders     None        Note:  This document was prepared using Dragon voice recognition software and may include unintentional dictation errors.   Vladimir Crofts, MD 09/15/21 [id number]

## 2021-09-15 NOTE — Discharge Instructions (Addendum)
Sucralfate/Carafate can help bolster the lining of the stomach and address acid reflux/stomach acid that could be contributing to your cough.

## 2021-09-20 ENCOUNTER — Other Ambulatory Visit
Admission: RE | Admit: 2021-09-20 | Discharge: 2021-09-20 | Disposition: A | Discharge status: Home / Self Care | Payer: Medicare HMO | Source: Ambulatory Visit | Attending: Student in an Organized Health Care Education/Training Program | Admitting: Student in an Organized Health Care Education/Training Program

## 2021-09-20 ENCOUNTER — Encounter: Payer: Self-pay | Admitting: Student in an Organized Health Care Education/Training Program

## 2021-09-20 ENCOUNTER — Ambulatory Visit: Payer: Medicare HMO | Admitting: Student in an Organized Health Care Education/Training Program

## 2021-09-20 VITALS — BP 124/80 | HR 78 | Temp 98.0°F | Ht 63.5 in | Wt 162.0 lb

## 2021-09-20 DIAGNOSIS — R052 Subacute cough: Secondary | ICD-10-CM | POA: Insufficient documentation

## 2021-09-20 MED ORDER — ARNUITY ELLIPTA 100 MCG/ACT IN AEPB: 1.0000 | INHALATION_SPRAY | Freq: Every day | RESPIRATORY_TRACT | 3 refills | Status: DC

## 2021-09-20 NOTE — Progress Notes (Signed)
Synopsis: Referred in for cough by Gae Bon, NP  Assessment & Plan:   #Subacute Cough   She is presenting for cough of 5 weeks in duration suggestive of subacute cough. My differentials include cough variant asthma, upper airway cough syndrome, and GERD.  I will work her up for cough variant asthma with a pulmonary function test and a methacholine challenge as well as obtain an allergen panel with an IgE level. After discussing with the patient, and reviewing her CT scan that shows some mild thickening of the bronchial walls, we came to an agreement of trialing an inhaled corticosteroid to see if it helps with her symptoms given the fact that this is very disturbing for her.  Furthermore, I have counseled Ms. Anastasi regarding a GERD diet and have provided her with information regarding foods to avoid. She is taking Famotidine which she will continue. She will also continue taking Flonase as previously prescribed.  Plan: - PFT's with methacholine challenge test - Continue Famotidine 40 mg daily - Start Arnuity 100 mcg one puff daily - GERD consistent diet - CBC w/ differential - Allergen panel and IgE level  Return in about 5 weeks (around 10/25/2021).  I spent 60 minutes caring for this patient today, including preparing to see the patient, obtaining and/or reviewing separately obtained history, performing a medically appropriate examination and/or evaluation, counseling and educating the patient/family/caregiver, ordering medications, tests, or procedures, and documenting clinical information in the electronic health record  Armando Reichert, MD [facility] 09/20/2021 11:07 AM    End of visit medications:  Current Outpatient Medications:    albuterol (VENTOLIN HFA) 108 (90 Base) MCG/ACT inhaler, Inhale into the lungs., Disp: , Rfl:    Calcium Carbonate-Vitamin D (CALCIUM-VITAMIN D) 500-200 MG-UNIT per tablet, Take 1 tablet by mouth daily., Disp: , Rfl:     docusate sodium (COLACE) 100 MG capsule, Colace 100 mg capsule   100 mg by oral route., Disp: , Rfl:    famotidine (PEPCID) 40 MG tablet, Take 1 tablet (40 mg total) by mouth at bedtime., Disp: 30 tablet, Rfl: 3   FLUoxetine (PROZAC) 40 MG capsule, Take 1 capsule (40 mg total) by mouth daily., Disp: 90 capsule, Rfl: 3   fluticasone (FLONASE) 50 MCG/ACT nasal spray, Place 2 sprays into both nostrils daily. (Patient taking differently: Place 2 sprays into both nostrils daily as needed for allergies.), Disp: 48 g, Rfl: 3   Fluticasone Furoate (ARNUITY ELLIPTA) 100 MCG/ACT AEPB, Inhale 1 puff into the lungs daily., Disp: 30 each, Rfl: 3   hydrochlorothiazide (MICROZIDE) 12.5 MG capsule, Take 1 capsule (12.5 mg total) by mouth daily., Disp: 90 capsule, Rfl: 3   lansoprazole (PREVACID) 30 MG capsule, Take 1 capsule (30 mg total) by mouth daily before breakfast. Take 30 minutes before breakfast, Disp: 90 capsule, Rfl: 3   meloxicam (MOBIC) 15 MG tablet, , Disp: , Rfl:    Multiple Vitamins-Minerals (MULTIVITAMIN PO), Take 1 tablet by mouth daily., Disp: , Rfl:    pravastatin (PRAVACHOL) 80 MG tablet, Take 1 tablet (80 mg total) by mouth daily., Disp: 90 tablet, Rfl: 3   sucralfate (CARAFATE) 1 g tablet, Take 1 tablet (1 g total) by mouth 4 (four) times daily -  with meals and at bedtime., Disp: 120 tablet, Rfl: 1   sucralfate (CARAFATE) 1 GM/10ML suspension, Take 10 mLs (1 g total) by mouth in the morning, at noon, and at bedtime., Disp: 420 mL, Rfl: 2   Tetrahydrozoline HCl (VISINE  OP), Place 1 drop into both eyes as needed (for dry eyes)., Disp: , Rfl:    traZODone (DESYREL) 50 MG tablet, TAKE 1/2 TO 1 TABLET AT BEDTIME AS NEEDED FOR SLEEP, Disp: 90 tablet, Rfl: 3   Subjective:   PATIENT ID: Connie Rowe GENDER: female DOB: October 26, 1950, MRN: [id number]  Chief Complaint  Patient presents with   pulmonary consult    C/o dry cough and occ wheezing x5wk.    HPI  Connie Rowe is a pleasant  71 year old female presenting to clinic for the chief complaint of cough.  Patient reports that her symptoms started around 5 weeks ago and have persisted since then.  The cough is sporadic, and can happen at rest and with exertion.  It is nonproductive and is extremely discomforting.  She does not have any fevers or chills.  She does not have any chest pain or chest tightness though the cough itself does cause her to have some discomfort in her chest wall.  She denies any sick contacts or exposures. She recalls that she was caring for her grandchild around the time her symptoms started, but her grand child was not ill. Upon further questioning, she does tell me that she cleaned her carpets the day her cough started. She does not have mold in her house, nor does she have any occupational exposures. 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 She has one dog that they have had for 8 years now. No other pets, birds, or other exposures reported. She is not on any ACE inhibitors  She has been to the ER as well as has seen her primary care physician for the above mentioned chief complaint.  She had a CT scan of her chest during her most recent ED visit.  She was prescribed an as needed albuterol that she does not feel has helped her symptoms. She was given medicine for allergic rhinitis (Flonase) which did not help.  She was also given a course of antibiotics and a course of prednisone with no help.  She does not report a scratchy sensation in her throat or continued drainage from her nose.  She reports some symptoms of reflux disease however is not overly concerned with that.  When asked, she does report enjoying foods that exacerbate reflux.  She does not have any personal history of pulmonary disease or asthma but acknowledges that colds have always been a little difficult on her. She reports usually having postviral cough for around 2 to 3 weeks after being ill throughout her life.  Ancillary  information including prior medications, full medical/surgical/family/social histories, and PFTs (when available) are listed below and have been reviewed.   Review of Systems  Constitutional:  Negative for chills, fever and weight loss.  HENT:  Negative for congestion, nosebleeds and sinus pain.   Respiratory:  Positive for cough. Negative for hemoptysis, sputum production, shortness of breath and wheezing.   Cardiovascular:  Negative for chest pain.     Objective:   Vitals:   09/20/21 1032  BP: 124/80  Pulse: 78  Temp: 98 F (36.7 C)  TempSrc: Temporal  SpO2: 95%  Weight: 162 lb (73.5 kg)  Height: 5' 3.5" (1.613 m)   95% on RA BMI Readings from Last 3 Encounters:  09/20/21 28.25 kg/m  09/14/21 28.34 kg/m  12/06/20 26.26 kg/m   Wt Readings from Last 3 Encounters:  09/20/21 162 lb (73.5 kg)  09/14/21 160 lb (72.6 kg)  12/06/20 153 lb (69.4 kg)  Physical Exam Constitutional:      Appearance: Normal appearance.  HENT:     Head: Normocephalic.     Nose: Nose normal.     Mouth/Throat:     Mouth: Mucous membranes are moist.  Cardiovascular:     Rate and Rhythm: Normal rate and regular rhythm.     Pulses: Normal pulses.     Heart sounds: Normal heart sounds.  Pulmonary:     Breath sounds: Normal breath sounds.  Abdominal:     General: Abdomen is flat.     Palpations: Abdomen is soft.  Musculoskeletal:        General: Normal range of motion.  Skin:    General: Skin is warm.  Neurological:     General: No focal deficit present.     Mental Status: She is alert. Mental status is at baseline.       Ancillary Information    Past Medical History:  Diagnosis Date   Allergy    Anxiety    Arthritis    Colon polyps    Depression    Esophageal stricture    Falls    GERD (gastroesophageal reflux disease)    H/O fracture of humerus 01/31/2004   History of bronchitis    History of hiatal hernia    Hyperlipidemia    Hypertension    Nocturia     Osteopenia    Pancreatitis    History of   Sleep apnea    uses CPAP     Family History  Problem Relation Age of Onset   Stroke Mother    Hypertension Mother    Colon cancer Mother    Stroke Brother    Drug abuse Son    Colon polyps Neg Hx    Crohn's disease Neg Hx    Pancreatic cancer Neg Hx    Rectal cancer Neg Hx    Stomach cancer Neg Hx    Ulcerative colitis Neg Hx    Breast cancer Neg Hx    Esophageal cancer Neg Hx      Past Surgical History:  Procedure Laterality Date   BLADDER REPAIR     COLONOSCOPY     DIAGNOSTIC LAPAROSCOPY     tubal ligation   DILATION AND CURETTAGE OF UTERUS     ESOPHAGEAL DILATION     LUMBAR LAMINECTOMY/DECOMPRESSION MICRODISCECTOMY Right 09/23/2014   Procedure: Laminectomy and Foraminotomy - Lumbar three- lumbar four - right ;  Surgeon: Eustace Moore, MD;  Location: [facility];  Service: Neurosurgery;  Laterality: Right;   MULTIPLE TOOTH EXTRACTIONS     TONSILLECTOMY     TOTAL HIP ARTHROPLASTY Right 03/30/2015   Procedure: RIGHT TOTAL HIP ARTHROPLASTY ANTERIOR APPROACH;  Surgeon: Paralee Cancel, MD;  Location: [facility];  Service: Orthopedics;  Laterality: Right;   TOTAL HIP ARTHROPLASTY Left 01/09/2017   Procedure: LEFT TOTAL HIP ARTHROPLASTY ANTERIOR APPROACH;  Surgeon: Paralee Cancel, MD;  Location: [facility];  Service: Orthopedics;  Laterality: Left;  70 mins   TUBAL LIGATION     UPPER GASTROINTESTINAL ENDOSCOPY     UPPER GI ENDOSCOPY      Social History   Socioeconomic History   Marital status: Married    Spouse name: Not on file   Number of children: Not on file   Years of education: Not on file   Highest education level: Not on file  Occupational History   Not on file  Tobacco Use   Smoking status: Never   Smokeless tobacco: Never  Vaping Use   Vaping Use: Never used  Substance and Sexual Activity   Alcohol use: Yes    Comment: very rarely   Drug use: No   Sexual activity: Yes  Other Topics Concern   Not on file  Social  History Narrative   Not on file   Social Determinants of Health   Financial Resource Strain: Low Risk  (08/05/2020)   Overall Financial Resource Strain (CARDIA)    Difficulty of Paying Living Expenses: Not very hard  Food Insecurity: No Food Insecurity (04/30/2020)   Hunger Vital Sign    Worried About Running Out of Food in the Last Year: Never true    Ran Out of Food in the Last Year: Never true  Transportation Needs: No Transportation Needs (04/30/2020)   PRAPARE - Hydrologist (Medical): No    Lack of Transportation (Non-Medical): No  Physical Activity: Insufficiently Active (04/30/2020)   Exercise Vital Sign    Days of Exercise per Week: 3 days    Minutes of Exercise per Session: 20 min  Stress: No Stress Concern Present (04/30/2020)   [place]    Feeling of Stress : Not at all  Social Connections: Not on file  Intimate Partner Violence: Not At Risk (04/30/2020)   Humiliation, Afraid, Rape, and Kick questionnaire    Fear of Current or Ex-Partner: No    Emotionally Abused: No    Physically Abused: No    Sexually Abused: No     Allergies  Allergen Reactions   Ace Inhibitors Cough   Ambien [Zolpidem Tartrate] Other (See Comments)    Affected memory     CBC    Component Value Date/Time   WBC 7.9 09/14/2021 2010   RBC 4.62 09/14/2021 2010   HGB 13.8 09/14/2021 2010   HCT 43.0 09/14/2021 2010   PLT 262 09/14/2021 2010   MCV 93.1 09/14/2021 2010   MCH 29.9 09/14/2021 2010   MCHC 32.1 09/14/2021 2010   RDW 13.4 09/14/2021 2010   LYMPHSABS 1.9 04/30/2020 0921   MONOABS 0.4 04/30/2020 0921   EOSABS 0.1 04/30/2020 0921   BASOSABS 0.0 04/30/2020 4098    Pulmonary Functions Testing Results:     No data to display          Outpatient Medications Prior to Visit  Medication Sig Dispense Refill   albuterol (VENTOLIN HFA) 108 (90 Base) MCG/ACT inhaler Inhale into the lungs.      Calcium Carbonate-Vitamin D (CALCIUM-VITAMIN D) 500-200 MG-UNIT per tablet Take 1 tablet by mouth daily.     docusate sodium (COLACE) 100 MG capsule Colace 100 mg capsule   100 mg by oral route.     famotidine (PEPCID) 40 MG tablet Take 1 tablet (40 mg total) by mouth at bedtime. 30 tablet 3   FLUoxetine (PROZAC) 40 MG capsule Take 1 capsule (40 mg total) by mouth daily. 90 capsule 3   fluticasone (FLONASE) 50 MCG/ACT nasal spray Place 2 sprays into both nostrils daily. (Patient taking differently: Place 2 sprays into both nostrils daily as needed for allergies.) 48 g 3   hydrochlorothiazide (MICROZIDE) 12.5 MG capsule Take 1 capsule (12.5 mg total) by mouth daily. 90 capsule 3   lansoprazole (PREVACID) 30 MG capsule Take 1 capsule (30 mg total) by mouth daily before breakfast. Take 30 minutes before breakfast 90 capsule 3   meloxicam (MOBIC) 15 MG tablet      Multiple Vitamins-Minerals (MULTIVITAMIN PO)  Take 1 tablet by mouth daily.     pravastatin (PRAVACHOL) 80 MG tablet Take 1 tablet (80 mg total) by mouth daily. 90 tablet 3   sucralfate (CARAFATE) 1 g tablet Take 1 tablet (1 g total) by mouth 4 (four) times daily -  with meals and at bedtime. 120 tablet 1   sucralfate (CARAFATE) 1 GM/10ML suspension Take 10 mLs (1 g total) by mouth in the morning, at noon, and at bedtime. 420 mL 2   Tetrahydrozoline HCl (VISINE OP) Place 1 drop into both eyes as needed (for dry eyes).     traZODone (DESYREL) 50 MG tablet TAKE 1/2 TO 1 TABLET AT BEDTIME AS NEEDED FOR SLEEP 90 tablet 3   DROPLET PEN NEEDLES 32G X 4 MM MISC      OZEMPIC, 1 MG/DOSE, 4 MG/3ML SOPN Inject into the skin. (Patient not taking: Reported on 09/20/2021)     sucralfate (CARAFATE) 1 g tablet Take 1 tablet (1 g total) by mouth 3 (three) times daily as needed (before meals and as needed). (Patient not taking: Reported on 09/20/2021) 90 tablet 0   No facility-administered medications prior to visit.

## 2021-09-20 NOTE — Patient Instructions (Signed)
Today, we discussed workup for your shortness of breath.  I will order a breathing test to evaluate you for asthma I have ordered blood work to workup allergies I have ordered an inhaler for you to use daily

## 2021-09-22 LAB — ALLERGEN PANEL (27) + IGE
Alternaria Alternata IgE: <0.1 kU/L
Aspergillus Fumigatus IgE: <0.1 kU/L
Bahia Grass IgE: <0.1 kU/L
Bermuda Grass IgE: <0.1 kU/L
Cat Dander IgE: <0.1 kU/L
Cedar, Mountain IgE: <0.1 kU/L
Cladosporium Herbarum IgE: <0.1 kU/L
Cocklebur IgE: <0.1 kU/L
Cockroach, American IgE: <0.1 kU/L
Common Silver Birch IgE: <0.1 kU/L
D Farinae IgE: <0.1 kU/L
D Pteronyssinus IgE: <0.1 kU/L
Dog Dander IgE: <0.1 kU/L
Elm, American IgE: <0.1 kU/L
Hickory, White IgE: <0.1 kU/L
IgE (Immunoglobulin E), Serum: <2 IU/mL — ABNORMAL LOW (ref 6–495)
Johnson Grass IgE: <0.1 kU/L
Kentucky Bluegrass IgE: <0.1 kU/L
Maple/Box Elder IgE: <0.1 kU/L
Mucor Racemosus IgE: <0.1 kU/L
Oak, White IgE: <0.1 kU/L
Penicillium Chrysogen IgE: <0.1 kU/L
Pigweed, Rough IgE: <0.1 kU/L
Plantain, English IgE: <0.1 kU/L
Ragweed, Short IgE: <0.1 kU/L
Setomelanomma Rostrat: <0.1 kU/L
Timothy Grass IgE: <0.1 kU/L
White Mulberry IgE: <0.1 kU/L

## 2021-10-07 ENCOUNTER — Ambulatory Visit: Payer: Medicare HMO | Admitting: Family Medicine

## 2021-10-10 ENCOUNTER — Ambulatory Visit (INDEPENDENT_AMBULATORY_CARE_PROVIDER_SITE_OTHER): Payer: Medicare HMO | Admitting: Family Medicine

## 2021-10-10 ENCOUNTER — Encounter: Payer: Self-pay | Admitting: Family Medicine

## 2021-10-10 VITALS — BP 134/86 | HR 78 | Temp 98.2°F | Ht 63.5 in | Wt 158.5 lb

## 2021-10-10 DIAGNOSIS — I10 Essential (primary) hypertension: Secondary | ICD-10-CM | POA: Diagnosis not present

## 2021-10-10 DIAGNOSIS — R7303 Prediabetes: Secondary | ICD-10-CM

## 2021-10-10 DIAGNOSIS — F418 Other specified anxiety disorders: Secondary | ICD-10-CM

## 2021-10-10 DIAGNOSIS — Z23 Encounter for immunization: Secondary | ICD-10-CM

## 2021-10-10 DIAGNOSIS — Z1231 Encounter for screening mammogram for malignant neoplasm of breast: Secondary | ICD-10-CM

## 2021-10-10 DIAGNOSIS — M85859 Other specified disorders of bone density and structure, unspecified thigh: Secondary | ICD-10-CM

## 2021-10-10 DIAGNOSIS — E785 Hyperlipidemia, unspecified: Secondary | ICD-10-CM | POA: Diagnosis not present

## 2021-10-10 DIAGNOSIS — E1169 Type 2 diabetes mellitus with other specified complication: Secondary | ICD-10-CM

## 2021-10-10 LAB — CBC WITH DIFFERENTIAL/PLATELET
Basophils Absolute: 0 10*3/uL (ref 0.0–0.1)
Basophils Relative: 0.4 % (ref 0.0–3.0)
Eosinophils Absolute: 0.1 10*3/uL (ref 0.0–0.7)
Eosinophils Relative: 2.8 % (ref 0.0–5.0)
HCT: 39.9 % (ref 36.0–46.0)
Hemoglobin: 13.3 g/dL (ref 12.0–15.0)
Lymphocytes Relative: 39.2 % (ref 12.0–46.0)
Lymphs Abs: 1.9 10*3/uL (ref 0.7–4.0)
MCHC: 33.2 g/dL (ref 30.0–36.0)
MCV: 91.2 fl (ref 78.0–100.0)
Monocytes Absolute: 0.4 10*3/uL (ref 0.1–1.0)
Monocytes Relative: 8.9 % (ref 3.0–12.0)
Neutro Abs: 2.3 10*3/uL (ref 1.4–7.7)
Neutrophils Relative %: 48.7 % (ref 43.0–77.0)
Platelets: 234 10*3/uL (ref 150.0–400.0)
RBC: 4.38 Mil/uL (ref 3.87–5.11)
RDW: 13.8 % (ref 11.5–15.5)
WBC: 4.7 10*3/uL (ref 4.0–10.5)

## 2021-10-10 LAB — COMPREHENSIVE METABOLIC PANEL
ALT: 13 U/L (ref 0–35)
AST: 13 U/L (ref 0–37)
Albumin: 4.1 g/dL (ref 3.5–5.2)
Alkaline Phosphatase: 57 U/L (ref 39–117)
BUN: 14 mg/dL (ref 6–23)
CO2: 31 mEq/L (ref 19–32)
Calcium: 9.5 mg/dL (ref 8.4–10.5)
Chloride: 103 mEq/L (ref 96–112)
Creatinine, Ser: 1.15 mg/dL (ref 0.40–1.20)
GFR: 47.36 mL/min — ABNORMAL LOW (ref >60.00)
Glucose, Bld: 78 mg/dL (ref 70–99)
Potassium: 3.9 mEq/L (ref 3.5–5.1)
Sodium: 140 mEq/L (ref 135–145)
Total Bilirubin: 0.4 mg/dL (ref 0.2–1.2)
Total Protein: 6.6 g/dL (ref 6.0–8.3)

## 2021-10-10 LAB — LIPID PANEL
Cholesterol: 182 mg/dL (ref 0–200)
HDL: 79.5 mg/dL (ref >39.00)
LDL Cholesterol: 80 mg/dL (ref 0–99)
NonHDL: 102.79
Total CHOL/HDL Ratio: 2
Triglycerides: 115 mg/dL (ref 0.0–149.0)
VLDL: 23 mg/dL (ref 0.0–40.0)

## 2021-10-10 LAB — TSH: TSH: 0.67 u[IU]/mL (ref 0.35–5.50)

## 2021-10-10 NOTE — Assessment & Plan Note (Signed)
Had dexa 04/2021 (bmd was better)  No falls or fractures  Enc her to continue ca and D and start exercise

## 2021-10-10 NOTE — Assessment & Plan Note (Addendum)
a1c ordered disc imp of low glycemic diet and wt loss to prevent DM2  Has done well with wt loss  Diabetic in past  No longer on metformin  Was briefly on ozempic and did not tolerate it

## 2021-10-10 NOTE — Patient Instructions (Addendum)
Mammogram order is in  Call [facility] to schedule it   Get back into a regular exercise routine   Lab today   Take care of yourself

## 2021-10-10 NOTE — Assessment & Plan Note (Signed)
Continues fluoxetine 40 mg daily  Trazodone 25 to 50 mg for sleep as needed Encouraged good self care

## 2021-10-10 NOTE — Assessment & Plan Note (Signed)
Mammogram ordered °Pt will call to schedule  °

## 2021-10-10 NOTE — Assessment & Plan Note (Signed)
Disc goals for lipids and reasons to control them Rev last labs with pt Rev low sat fat diet in detail Taking pravastatin 80 mg daily   Lab today

## 2021-10-10 NOTE — Progress Notes (Signed)
Subjective:    Patient ID: Connie Rowe, female    DOB: [birth year], 71 y.o.   MRN: [id number]  HPI Pt presents to re establish care , f/u for HTN  and DM and chronic medical problems    Wt Readings from Last 3 Encounters:  10/10/21 158 lb 8 oz (71.9 kg)  09/20/21 162 lb (73.5 kg)  09/14/21 160 lb (72.6 kg)   27.64 kg/m  Has been doing well   Has not been exercising for 6 weeks / bad cough She had to care for step mother/who did just die  Plans to start with it again- home exercise and walking   Trying to eat healthy  More last week with food brought in after family loss    She went to a new PCP trying to loose weight  Was px ozemipc -did not tolerate it   Saw pulmonary doctor for cough and fatigue  Was given carafate in the ER  Doing better now  Arnuity elipta - from pulmonary helps  Was told reflux caused some asthma   Is up to date with GI care     HTN bp is stable today  No cp or palpitations or headaches or edema  No side effects to medicines  BP Readings from Last 3 Encounters:  10/10/21 134/86  09/20/21 124/80  09/15/21 (!) 130/93     Hctz 12.5 mg daily   Lab Results  Component Value Date   CREATININE 1.19 (H) 09/14/2021   BUN 18 09/14/2021   NA 137 09/14/2021   K 3.8 09/14/2021   CL 100 09/14/2021   CO2 31 09/14/2021   GFR 48   DM2 Lab Results  Component Value Date   HGBA1C 5.5 04/30/2020   Due for labs  No longer on metformin -was better after wt ;pss  Hyperlipidemia Lab Results  Component Value Date   CHOL 193 04/30/2020   HDL 69.40 04/30/2020   LDLCALC 91 04/30/2020   LDLDIRECT 126.0 06/03/2014   TRIG 161.0 (H) 04/30/2020   CHOLHDL 3 04/30/2020  Pravastatin 80 mg daily   Osteopenia  Dexa 04/2021-bmd was normal    Depression/anxiety  Fluoxetine   Patient Active Problem List   Diagnosis Date Noted   Screening mammogram for breast cancer 10/10/2021   Family hx of colon cancer 05/07/2020   Cervical disc disease  08/19/2019   Right hand paresthesia 08/18/2019   Right arm pain 08/18/2019   Right shoulder pain 08/18/2019   Atypical chest pain 10/14/2018   Dysphagia 10/14/2018   Allergic rhinitis 09/03/2017   Insomnia 11/27/2016   Daytime somnolence 05/23/2016   Snoring 05/23/2016   Estrogen deficiency 10/19/2015   Need for hepatitis C screening test 09/27/2015   S/P right THA, AA 03/30/2015   Pre-operative examination 02/24/2015   S/P lumbar laminectomy 09/23/2014   Essential hypertension 06/26/2014   Encounter for Medicare annual wellness exam 06/03/2014   Colon cancer screening 06/03/2014   Sciatica 11/26/2013   Prediabetes 04/17/2013   Hirsutism 12/06/2010   Routine general medical examination at a health care facility 09/10/2010   Other screening mammogram 09/01/2010   Osteopenia 10/22/2006   URINARY INCONTINENCE, STRESS 08/65/7846   History of Helicobacter pylori infection 04/18/2006   Hyperlipidemia associated with type 2 diabetes mellitus ([place]) 04/18/2006   Depression with anxiety 04/18/2006   EXTERNAL HEMORRHOIDS 04/18/2006   GERD 04/18/2006   HIATAL HERNIA 04/18/2006   OVERACTIVE BLADDER 04/18/2006   HEMATURIA, MICROSCOPIC 04/18/2006   PANCREATITIS, HX OF 04/18/2006  DIVERTICULOSIS, COLON 01/02/2000   Past Medical History:  Diagnosis Date   Allergy    Anxiety    Arthritis    Colon polyps    Depression    Esophageal stricture    Falls    GERD (gastroesophageal reflux disease)    H/O fracture of humerus 01/31/2004   History of bronchitis    History of hiatal hernia    Hyperlipidemia    Hypertension    Nocturia    Osteopenia    Pancreatitis    History of   Sleep apnea    uses CPAP   Past Surgical History:  Procedure Laterality Date   BLADDER REPAIR     COLONOSCOPY     DIAGNOSTIC LAPAROSCOPY     tubal ligation   DILATION AND CURETTAGE OF UTERUS     ESOPHAGEAL DILATION     LUMBAR LAMINECTOMY/DECOMPRESSION MICRODISCECTOMY Right 09/23/2014   Procedure:  Laminectomy and Foraminotomy - Lumbar three- lumbar four - right ;  Surgeon: Eustace Moore, MD;  Location: [facility];  Service: Neurosurgery;  Laterality: Right;   MULTIPLE TOOTH EXTRACTIONS     TONSILLECTOMY     TOTAL HIP ARTHROPLASTY Right 03/30/2015   Procedure: RIGHT TOTAL HIP ARTHROPLASTY ANTERIOR APPROACH;  Surgeon: Paralee Cancel, MD;  Location: [facility];  Service: Orthopedics;  Laterality: Right;   TOTAL HIP ARTHROPLASTY Left 01/09/2017   Procedure: LEFT TOTAL HIP ARTHROPLASTY ANTERIOR APPROACH;  Surgeon: Paralee Cancel, MD;  Location: [facility];  Service: Orthopedics;  Laterality: Left;  70 mins   TUBAL LIGATION     UPPER GASTROINTESTINAL ENDOSCOPY     UPPER GI ENDOSCOPY     Social History   Tobacco Use   Smoking status: Never   Smokeless tobacco: Never  Vaping Use   Vaping Use: Never used  Substance Use Topics   Alcohol use: Yes    Comment: very rarely   Drug use: No   Family History  Problem Relation Age of Onset   Stroke Mother    Hypertension Mother    Colon cancer Mother    Stroke Brother    Drug abuse Son    Colon polyps Neg Hx    Crohn's disease Neg Hx    Pancreatic cancer Neg Hx    Rectal cancer Neg Hx    Stomach cancer Neg Hx    Ulcerative colitis Neg Hx    Breast cancer Neg Hx    Esophageal cancer Neg Hx    Allergies  Allergen Reactions   Ace Inhibitors Cough   Ambien [Zolpidem Tartrate] Other (See Comments)    Affected memory   Current Outpatient Medications on File Prior to Visit  Medication Sig Dispense Refill   Calcium Carbonate-Vitamin D (CALCIUM-VITAMIN D) 500-200 MG-UNIT per tablet Take 1 tablet by mouth daily.     famotidine (PEPCID) 40 MG tablet Take 1 tablet (40 mg total) by mouth at bedtime. 30 tablet 3   FLUoxetine (PROZAC) 40 MG capsule Take 1 capsule (40 mg total) by mouth daily. 90 capsule 3   fluticasone (FLONASE) 50 MCG/ACT nasal spray Place 2 sprays into both nostrils daily. (Patient taking differently: Place 2 sprays into both  nostrils daily as needed for allergies.) 48 g 3   Fluticasone Furoate (ARNUITY ELLIPTA) 100 MCG/ACT AEPB Inhale 1 puff into the lungs daily. 30 each 3   hydrochlorothiazide (MICROZIDE) 12.5 MG capsule Take 1 capsule (12.5 mg total) by mouth daily. 90 capsule 3   lansoprazole (PREVACID) 30 MG capsule Take 1 capsule (  30 mg total) by mouth daily before breakfast. Take 30 minutes before breakfast 90 capsule 3   meloxicam (MOBIC) 15 MG tablet      Multiple Vitamins-Minerals (MULTIVITAMIN PO) Take 1 tablet by mouth daily.     pravastatin (PRAVACHOL) 80 MG tablet Take 1 tablet (80 mg total) by mouth daily. 90 tablet 3   sucralfate (CARAFATE) 1 g tablet Take 1 tablet (1 g total) by mouth 4 (four) times daily -  with meals and at bedtime. 120 tablet 1   traZODone (DESYREL) 50 MG tablet TAKE 1/2 TO 1 TABLET AT BEDTIME AS NEEDED FOR SLEEP 90 tablet 3   No current facility-administered medications on file prior to visit.    Review of Systems  Constitutional:  Negative for activity change, appetite change, fatigue, fever and unexpected weight change.  HENT:  Negative for congestion, ear pain, rhinorrhea, sinus pressure and sore throat.   Eyes:  Negative for pain, redness and visual disturbance.  Respiratory:  Negative for cough, shortness of breath and wheezing.   Cardiovascular:  Negative for chest pain and palpitations.  Gastrointestinal:  Negative for abdominal pain, blood in stool, constipation and diarrhea.  Endocrine: Negative for polydipsia and polyuria.  Genitourinary:  Negative for dysuria, frequency and urgency.  Musculoskeletal:  Negative for arthralgias, back pain and myalgias.  Skin:  Negative for pallor and rash.  Allergic/Immunologic: Negative for environmental allergies.  Neurological:  Negative for dizziness, syncope and headaches.  Hematological:  Negative for adenopathy. Does not bruise/bleed easily.  Psychiatric/Behavioral:  Negative for decreased concentration and dysphoric mood.  The patient is not nervous/anxious.        Objective:   Physical Exam Constitutional:      General: She is not in acute distress.    Appearance: Normal appearance. She is well-developed and normal weight. She is not ill-appearing or diaphoretic.  HENT:     Head: Normocephalic and atraumatic.  Eyes:     General: No scleral icterus.    Conjunctiva/sclera: Conjunctivae normal.     Pupils: Pupils are equal, round, and reactive to light.  Neck:     Thyroid: No thyromegaly.     Vascular: No carotid bruit or JVD.  Cardiovascular:     Rate and Rhythm: Normal rate and regular rhythm.     Heart sounds: Normal heart sounds.     No gallop.  Pulmonary:     Effort: Pulmonary effort is normal. No respiratory distress.     Breath sounds: Normal breath sounds. No wheezing or rales.  Abdominal:     General: There is no distension or abdominal bruit.     Palpations: Abdomen is soft.  Musculoskeletal:     Cervical back: Normal range of motion and neck supple.     Right lower leg: No edema.     Left lower leg: No edema.  Lymphadenopathy:     Cervical: No cervical adenopathy.  Skin:    General: Skin is warm and dry.     Coloration: Skin is not pale.     Findings: No rash.  Neurological:     Mental Status: She is alert.     Coordination: Coordination normal.     Deep Tendon Reflexes: Reflexes are normal and symmetric. Reflexes normal.  Psychiatric:        Mood and Affect: Mood normal.           Assessment & Plan:   Problem List Items Addressed This Visit       Cardiovascular and Mediastinum  Essential hypertension    bp in fair control at this time  BP Readings from Last 1 Encounters:  10/10/21 134/86  No changes needed Most recent labs reviewed  Disc lifstyle change with low sodium diet and exercise  Taking hctz 12.5 mg daily  Labs ordered       Relevant Orders   TSH   Lipid panel   Comprehensive metabolic panel   CBC with Differential/Platelet     Endocrine    Hyperlipidemia associated with type 2 diabetes mellitus ([place])    Disc goals for lipids and reasons to control them Rev last labs with pt Rev low sat fat diet in detail Taking pravastatin 80 mg daily   Lab today      Relevant Orders   Lipid panel     Musculoskeletal and Integument   Osteopenia    Had dexa 04/2021 (bmd was better)  No falls or fractures  Enc her to continue ca and D and start exercise           Other   Depression with anxiety    Continues fluoxetine 40 mg daily  Trazodone 25 to 50 mg for sleep as needed Encouraged good self care       Prediabetes - Primary    a1c ordered disc imp of low glycemic diet and wt loss to prevent DM2  Has done well with wt loss  Diabetic in past  No longer on metformin  Was briefly on ozempic and did not tolerate it       Relevant Orders   Hemoglobin A1c   Screening mammogram for breast cancer    Mammogram ordered Pt will call to schedule       Relevant Orders   MM 3D SCREEN BREAST BILATERAL   Other Visit Diagnoses     Need for influenza vaccination       Relevant Orders   Flu Vaccine QUAD High Dose(Fluad) (Completed)

## 2021-10-10 NOTE — Assessment & Plan Note (Signed)
bp in fair control at this time  BP Readings from Last 1 Encounters:  10/10/21 134/86   No changes needed Most recent labs reviewed  Disc lifstyle change with low sodium diet and exercise  Taking hctz 12.5 mg daily  Labs ordered

## 2021-10-11 LAB — HEMOGLOBIN A1C: Hgb A1c MFr Bld: 6.3 % (ref 4.6–6.5)

## 2021-10-24 NOTE — Addendum Note (Signed)
Addended by: Claudette Head A on: 10/24/2021 10:10 AM   Modules accepted: Orders

## 2021-10-26 ENCOUNTER — Telehealth: Payer: Self-pay | Admitting: Adult Health

## 2021-10-26 MED ORDER — PANTOPRAZOLE SODIUM 40 MG PO TBEC: 40.0000 mg | DELAYED_RELEASE_TABLET | Freq: Every day | ORAL | 3 refills | Status: DC

## 2021-10-26 NOTE — Telephone Encounter (Signed)
Not on med list, will route to PCP for review

## 2021-10-26 NOTE — Telephone Encounter (Signed)
Patient called and requested a refill for Pantoprazole 40 mg and called into [facility], phone number [phone]. Call back number [phone].

## 2021-10-26 NOTE — Telephone Encounter (Signed)
I think that comes from her GI doctor-please ask her if that is right?

## 2021-10-26 NOTE — Telephone Encounter (Signed)
Spoke to patient by telephone and was advised that she has been getting the Pantoprazole from Dr. Glori Bickers and not her GI doctor.

## 2021-10-27 ENCOUNTER — Ambulatory Visit: Payer: Medicare HMO | Admitting: Pulmonary Disease

## 2021-10-27 ENCOUNTER — Encounter: Payer: Self-pay | Admitting: Pulmonary Disease

## 2021-10-27 VITALS — BP 124/84 | HR 76 | Temp 97.3°F | Ht 63.0 in | Wt 163.0 lb

## 2021-10-27 DIAGNOSIS — Z9989 Dependence on other enabling machines and devices: Secondary | ICD-10-CM | POA: Diagnosis not present

## 2021-10-27 DIAGNOSIS — R052 Subacute cough: Secondary | ICD-10-CM

## 2021-10-27 DIAGNOSIS — R0982 Postnasal drip: Secondary | ICD-10-CM | POA: Diagnosis not present

## 2021-10-27 DIAGNOSIS — G4733 Obstructive sleep apnea (adult) (pediatric): Secondary | ICD-10-CM

## 2021-10-27 NOTE — Progress Notes (Signed)
Connie Rowe, Critical Care, and Sleep Medicine  Chief Complaint  Patient presents with   Follow-up    PFT scheduled for 10/28/21. She is coughing less during the day, still has some dry cough at night. She is doing well with her CPAP.     Past Surgical History:  She  has a past surgical history that includes Tubal ligation; Tonsillectomy; Bladder repair; Colonoscopy; Upper gi endoscopy; Esophageal dilation; Diagnostic laparoscopy; Dilation and curettage of uterus; Multiple tooth extractions; Lumbar laminectomy/decompression microdiscectomy (Right, 09/23/2014); Total hip arthroplasty (Right, 03/30/2015); Total hip arthroplasty (Left, 01/09/2017); and Upper gastrointestinal endoscopy.  Past Medical History:  Allergies, Anxiety, OA, Colon polyps, Depression, Esophageal stricture, GERD, Hiatal hernia, HLD, HTN, Osteopenia, Pancreatitis  Constitutional:  BP 124/84 (BP Location: Left Arm, Cuff Size: Normal)   Pulse 76   Temp (!) 97.3 F (36.3 C) (Oral)   Ht '5\' 3"'$  (1.6 m)   Wt 163 lb (73.9 kg)   SpO2 97% Comment: on RA  BMI 28.87 kg/m   Brief Summary:  Connie Rowe is a 71 y.o. female with obstructive sleep apnea.      Subjective:   She was seen previously by Dr. Mortimer Fries.  She wasn't able to follow up for her sleep apnea because of the COVID pandemic.  She was seen recently by Dr. Genia Harold for a cough and advised to have sleep follow up also.  She uses CPAP nightly.  Using nasal pillows.  Gets some mouth dryness but not too bad.  Gets cough when laying flat and has some sinus congestion.  Pressure setting feels good.  She was purchasing supplies on her own, but would like to get set up through insurance again. She feels rested during the day.  Physical Exam:   Appearance - well kempt   ENMT - no sinus tenderness, no oral exudate, no LAN, Mallampati 3 airway, no stridor  Respiratory - equal breath sounds bilaterally, no wheezing or rales  CV - s1s2 regular rate and rhythm,  no murmurs  Ext - no clubbing, no edema  Skin - no rashes  Psych - normal mood and affect   Sleep Tests:  PSG 09/05/16 >> AHI 12.8, SpO2 low 82.3% Auto CPAP 09/26/21 to 10/25/21 >> used on 28 of 30 nights with average 4 hrs 57 min.  Average AHI 1.1 with median CPAP 11 and 95 th percentile CPAP 12 cm H2O  Social History:  She  reports that she has never smoked. She has never used smokeless tobacco. She reports current alcohol use. She reports that she does not use drugs.  Family History:  Her family history includes Colon cancer in her mother; Drug abuse in her son; Hypertension in her mother; Stroke in her brother and mother.     Assessment/Plan:   Obstructive sleep apnea. - she is compliant with CPAP and reports benefit from therapy - she uses [facility] for her DME - her current CPAP is more than 73 years old - will arrange for new Resmed auto CPAP 5 to 14 cm H2O  Subacute cough. - improved - she will follow up with Dr. Armando Reichert  Post nasal drip. - explained how this could contribute to nocturnal cough - she can try using flonase before going to bed  Time Spent Involved in Patient Care on Day of Examination:  26 minutes  Follow up:   Patient Instructions  Will arrange for [employer] to set up a new Resmed auto CPAP machine with new supplies  Follow up  in 4 months  Medication List:   Allergies as of 10/27/2021       Reactions   Ace Inhibitors Cough   Ambien [zolpidem Tartrate] Other (See Comments)   Affected memory        Medication List        Accurate as of October 27, 2021 11:30 AM. If you have any questions, ask your nurse or doctor.          Arnuity Ellipta 100 MCG/ACT Aepb Generic drug: Fluticasone Furoate Inhale 1 puff into the lungs daily.   calcium-vitamin D 500-200 MG-UNIT tablet Take 1 tablet by mouth daily.   famotidine 40 MG tablet Commonly known as: Pepcid Take 1 tablet (40 mg total) by mouth at bedtime.   FLUoxetine 40 MG  capsule Commonly known as: PROZAC Take 1 capsule (40 mg total) by mouth daily.   fluticasone 50 MCG/ACT nasal spray Commonly known as: FLONASE Place 2 sprays into both nostrils daily. What changed:  when to take this reasons to take this   hydrochlorothiazide 12.5 MG capsule Commonly known as: MICROZIDE Take 1 capsule (12.5 mg total) by mouth daily.   meloxicam 15 MG tablet Commonly known as: MOBIC   MULTIVITAMIN PO Take 1 tablet by mouth daily.   pantoprazole 40 MG tablet Commonly known as: PROTONIX Take 1 tablet (40 mg total) by mouth daily.   pravastatin 80 MG tablet Commonly known as: PRAVACHOL Take 1 tablet (80 mg total) by mouth daily.   sucralfate 1 g tablet Commonly known as: Carafate Take 1 tablet (1 g total) by mouth 4 (four) times daily -  with meals and at bedtime.   traZODone 50 MG tablet Commonly known as: DESYREL TAKE 1/2 TO 1 TABLET AT BEDTIME AS NEEDED FOR SLEEP        Signature:  Chesley Mires, MD [place] Pager - [phone] 10/27/2021, 11:30 AM

## 2021-10-27 NOTE — Patient Instructions (Signed)
Will arrange for [facility] to set up a new Resmed auto CPAP machine with new supplies  Follow up in 4 months

## 2021-10-28 ENCOUNTER — Encounter (HOSPITAL_COMMUNITY): Payer: Medicare HMO

## 2021-10-28 ENCOUNTER — Ambulatory Visit (HOSPITAL_COMMUNITY): Admission: RE | Admit: 2021-10-28 | Payer: Medicare HMO | Source: Ambulatory Visit

## 2021-10-28 MED ORDER — ALBUTEROL SULFATE (2.5 MG/3ML) 0.083% IN NEBU: 2.5000 mg | INHALATION_SOLUTION | Freq: Once | RESPIRATORY_TRACT | Status: DC

## 2021-10-31 ENCOUNTER — Ambulatory Visit (HOSPITAL_COMMUNITY)
Admission: RE | Admit: 2021-10-31 | Discharge: 2021-10-31 | Disposition: A | Discharge status: Home / Self Care | Payer: Medicare HMO | Source: Ambulatory Visit | Attending: Student in an Organized Health Care Education/Training Program | Admitting: Student in an Organized Health Care Education/Training Program

## 2021-10-31 DIAGNOSIS — R052 Subacute cough: Secondary | ICD-10-CM | POA: Diagnosis present

## 2021-10-31 LAB — PULMONARY FUNCTION TEST
DL/VA % pred: 97 %
DL/VA: 4.02 ml/min/mmHg/L
DLCO unc % pred: 94 %
DLCO unc: 17.9 ml/min/mmHg
FEF 25-75 Post: 2.86 L/sec
FEF 25-75 Pre: 2.59 L/sec
FEF2575-%Change-Post: 10 %
FEF2575-%Pred-Post: 165 %
FEF2575-%Pred-Pre: 149 %
FEV1-%Change-Post: 4 %
FEV1-%Pred-Post: 118 %
FEV1-%Pred-Pre: 113 %
FEV1-Post: 2.52 L
FEV1-Pre: 2.41 L
FEV1FVC-%Change-Post: 2 %
FEV1FVC-%Pred-Pre: 109 %
FEV6-%Change-Post: 2 %
FEV6-%Pred-Post: 110 %
FEV6-%Pred-Pre: 107 %
FEV6-Post: 2.97 L
FEV6-Pre: 2.9 L
FEV6FVC-%Change-Post: 0 %
FEV6FVC-%Pred-Post: 104 %
FEV6FVC-%Pred-Pre: 104 %
FVC-%Change-Post: 1 %
FVC-%Pred-Post: 104 %
FVC-%Pred-Pre: 102 %
FVC-Post: 2.97 L
FVC-Pre: 2.91 L
Post FEV1/FVC ratio: 85 %
Post FEV6/FVC ratio: 100 %
Pre FEV1/FVC ratio: 83 %
Pre FEV6/FVC Ratio: 100 %
RV % pred: 82 %
RV: 1.84 L
TLC % pred: 95 %
TLC: 4.74 L

## 2021-10-31 MED ORDER — ALBUTEROL SULFATE (2.5 MG/3ML) 0.083% IN NEBU
2.5000 mg | INHALATION_SOLUTION | Freq: Once | RESPIRATORY_TRACT | Status: AC
  Administered 2021-10-31: 2.5 mg via RESPIRATORY_TRACT

## 2021-11-01 ENCOUNTER — Encounter: Payer: Self-pay | Admitting: Student in an Organized Health Care Education/Training Program

## 2021-11-01 ENCOUNTER — Ambulatory Visit: Payer: Medicare HMO | Admitting: Student in an Organized Health Care Education/Training Program

## 2021-11-01 VITALS — BP 126/74 | HR 96 | Temp 97.7°F | Ht 63.5 in | Wt 162.0 lb

## 2021-11-01 DIAGNOSIS — R052 Subacute cough: Secondary | ICD-10-CM

## 2021-11-01 NOTE — Progress Notes (Signed)
Synopsis: Presenting for follow up on subacute cough.  Assessment & Plan:   #Subacute Cough    Subacute cough that is now improved. This has improved with inhaled corticosteroids (initiated during our last visit given thickened airways on CT). PFT's have been fully within normal and she is pending a methacholine challenge test. Today, we discussed the possibility of GERD contributing to her symptoms, and went over a GERD compliant diet. She will attempt to cut out carbonated drinks from her diet. She is taking Famotidine which she will continue. She will also continue taking Flonase as previously prescribed.   Plan: - PFT's with methacholine challenge test - Continue Famotidine 40 mg daily - Continue Arnuity 100 mcg one puff daily - GERD consistent diet  Return in about 3 months (around 02/01/2022).  I spent 30 minutes caring for this patient today, including preparing to see the patient, obtaining and/or reviewing separately obtained history, performing a medically appropriate examination and/or evaluation, counseling and educating the patient/family/caregiver, ordering medications, tests, or procedures, and documenting clinical information in the electronic health record  Armando Reichert, MD [facility] 11/01/2021 10:18 AM    End of visit medications:  No orders of the defined types were placed in this encounter.    Current Outpatient Medications:    Calcium Carbonate-Vitamin D (CALCIUM-VITAMIN D) 500-200 MG-UNIT per tablet, Take 1 tablet by mouth daily., Disp: , Rfl:    famotidine (PEPCID) 40 MG tablet, Take 1 tablet (40 mg total) by mouth at bedtime., Disp: 30 tablet, Rfl: 3   FLUoxetine (PROZAC) 40 MG capsule, Take 1 capsule (40 mg total) by mouth daily., Disp: 90 capsule, Rfl: 3   fluticasone (FLONASE) 50 MCG/ACT nasal spray, Place 2 sprays into both nostrils daily. (Patient taking differently: Place 2 sprays into both nostrils daily as needed for  allergies.), Disp: 48 g, Rfl: 3   Fluticasone Furoate (ARNUITY ELLIPTA) 100 MCG/ACT AEPB, Inhale 1 puff into the lungs daily., Disp: 30 each, Rfl: 3   hydrochlorothiazide (MICROZIDE) 12.5 MG capsule, Take 1 capsule (12.5 mg total) by mouth daily., Disp: 90 capsule, Rfl: 3   meloxicam (MOBIC) 15 MG tablet, , Disp: , Rfl:    Multiple Vitamins-Minerals (MULTIVITAMIN PO), Take 1 tablet by mouth daily., Disp: , Rfl:    pantoprazole (PROTONIX) 40 MG tablet, Take 1 tablet (40 mg total) by mouth daily., Disp: 90 tablet, Rfl: 3   pravastatin (PRAVACHOL) 80 MG tablet, Take 1 tablet (80 mg total) by mouth daily., Disp: 90 tablet, Rfl: 3   sucralfate (CARAFATE) 1 g tablet, Take 1 tablet (1 g total) by mouth 4 (four) times daily -  with meals and at bedtime., Disp: 120 tablet, Rfl: 1   traZODone (DESYREL) 50 MG tablet, TAKE 1/2 TO 1 TABLET AT BEDTIME AS NEEDED FOR SLEEP, Disp: 90 tablet, Rfl: 3   Subjective:   PATIENT ID: Connie Rowe GENDER: female DOB: 08/03/1950, MRN: [id number]  Chief Complaint  Patient presents with   Follow-up    Cough has improved since last OV but is still present-Dry cough at times prod with greenish mucus.     HPI  Connie Rowe is a pleasant 71 year old female presenting to clinic for follow up. I had seen her on 09/20/2021 for the chief complaint of cough.   Patient reports that her symptoms started in July and had persisted. Cough was at rest and with exertion, and was non-productive. She was seen in the ED and a CT scan was performed (showing  thickened airways). She was given albuterol, Flonase, a course of antibiotics, and steroids with minimal help. During our last visit, I had initiated inhaled corticosteroids, continued Flonase, and counseled her on her diet. I also ordered a pulmonary function test as well as an allergen profile.  Today, she reports that her symptoms have significantly improved. The cough persists but is a lot less disruptive. She puts it at a 3 out of  10. She tells me that the cough is most disruptive to her at night when she lays flat. It is not productive and she denies any wheeze. She was seen by Dr. Chesley Mires recently for her sleep apnea.  Ancillary information including prior medications, full medical/surgical/family/social histories, and PFTs (when available) are listed below and have been reviewed.   Review of Systems  Constitutional:  Negative for chills, fever and weight loss.  HENT:  Negative for congestion, nosebleeds and sinus pain.   Respiratory:  Positive for cough. Negative for hemoptysis, sputum production, shortness of breath and wheezing.   Cardiovascular:  Negative for chest pain.     Objective:   Vitals:   11/01/21 1000  BP: 126/74  Pulse: 96  Temp: 97.7 F (36.5 C)  TempSrc: Temporal  SpO2: 96%  Weight: 162 lb (73.5 kg)  Height: 5' 3.5" (1.613 m)   96% on RA  BMI Readings from Last 3 Encounters:  11/01/21 28.25 kg/m  10/27/21 28.87 kg/m  10/10/21 27.64 kg/m   Wt Readings from Last 3 Encounters:  11/01/21 162 lb (73.5 kg)  10/27/21 163 lb (73.9 kg)  10/10/21 158 lb 8 oz (71.9 kg)    Physical Exam Constitutional:      Appearance: Normal appearance.  HENT:     Head: Normocephalic.     Nose: Nose normal. No congestion.     Mouth/Throat:     Mouth: Mucous membranes are moist.  Cardiovascular:     Rate and Rhythm: Normal rate and regular rhythm.     Pulses: Normal pulses.     Heart sounds: Normal heart sounds.  Pulmonary:     Breath sounds: Normal breath sounds.  Abdominal:     General: Abdomen is flat.     Palpations: Abdomen is soft.  Musculoskeletal:        General: Normal range of motion.  Skin:    General: Skin is warm.  Neurological:     General: No focal deficit present.     Mental Status: She is alert. Mental status is at baseline.       Ancillary Information    Past Medical History:  Diagnosis Date   Allergy    Anxiety    Arthritis    Colon polyps     Depression    Esophageal stricture    Falls    GERD (gastroesophageal reflux disease)    H/O fracture of humerus 01/31/2004   History of bronchitis    History of hiatal hernia    Hyperlipidemia    Hypertension    Nocturia    Osteopenia    Pancreatitis    History of   Sleep apnea    uses CPAP     Family History  Problem Relation Age of Onset   Stroke Mother    Hypertension Mother    Colon cancer Mother    Stroke Brother    Drug abuse Son    Colon polyps Neg Hx    Crohn's disease Neg Hx    Pancreatic cancer Neg Hx    Rectal  cancer Neg Hx    Stomach cancer Neg Hx    Ulcerative colitis Neg Hx    Breast cancer Neg Hx    Esophageal cancer Neg Hx      Past Surgical History:  Procedure Laterality Date   BLADDER REPAIR     COLONOSCOPY     DIAGNOSTIC LAPAROSCOPY     tubal ligation   DILATION AND CURETTAGE OF UTERUS     ESOPHAGEAL DILATION     LUMBAR LAMINECTOMY/DECOMPRESSION MICRODISCECTOMY Right 09/23/2014   Procedure: Laminectomy and Foraminotomy - Lumbar three- lumbar four - right ;  Surgeon: Eustace Moore, MD;  Location: [facility];  Service: Neurosurgery;  Laterality: Right;   MULTIPLE TOOTH EXTRACTIONS     TONSILLECTOMY     TOTAL HIP ARTHROPLASTY Right 03/30/2015   Procedure: RIGHT TOTAL HIP ARTHROPLASTY ANTERIOR APPROACH;  Surgeon: Paralee Cancel, MD;  Location: [facility];  Service: Orthopedics;  Laterality: Right;   TOTAL HIP ARTHROPLASTY Left 01/09/2017   Procedure: LEFT TOTAL HIP ARTHROPLASTY ANTERIOR APPROACH;  Surgeon: Paralee Cancel, MD;  Location: [facility];  Service: Orthopedics;  Laterality: Left;  70 mins   TUBAL LIGATION     UPPER GASTROINTESTINAL ENDOSCOPY     UPPER GI ENDOSCOPY      Social History   Socioeconomic History   Marital status: Married    Spouse name: Not on file   Number of children: Not on file   Years of education: Not on file   Highest education level: Not on file  Occupational History   Not on file  Tobacco Use   Smoking status:  Never   Smokeless tobacco: Never  Vaping Use   Vaping Use: Never used  Substance and Sexual Activity   Alcohol use: Yes    Comment: very rarely   Drug use: No   Sexual activity: Yes  Other Topics Concern   Not on file  Social History Narrative   Not on file   Social Determinants of Health   Financial Resource Strain: Low Risk  (08/05/2020)   Overall Financial Resource Strain (CARDIA)    Difficulty of Paying Living Expenses: Not very hard  Food Insecurity: No Food Insecurity (04/30/2020)   Hunger Vital Sign    Worried About Running Out of Food in the Last Year: Never true    [place] in the Last Year: Never true  Transportation Needs: No Transportation Needs (04/30/2020)   PRAPARE - Hydrologist (Medical): No    Lack of Transportation (Non-Medical): No  Physical Activity: Insufficiently Active (04/30/2020)   Exercise Vital Sign    Days of Exercise per Week: 3 days    Minutes of Exercise per Session: 20 min  Stress: No Stress Concern Present (04/30/2020)   [place]    Feeling of Stress : Not at all  Social Connections: Not on file  Intimate Partner Violence: Not At Risk (04/30/2020)   Humiliation, Afraid, Rape, and Kick questionnaire    Fear of Current or Ex-Partner: No    Emotionally Abused: No    Physically Abused: No    Sexually Abused: No     Allergies  Allergen Reactions   Ace Inhibitors Cough   Ambien [Zolpidem Tartrate] Other (See Comments)    Affected memory     CBC    Component Value Date/Time   WBC 4.7 10/10/2021 1143   RBC 4.38 10/10/2021 1143   HGB 13.3 10/10/2021 1143  HCT 39.9 10/10/2021 1143   PLT 234.0 10/10/2021 1143   MCV 91.2 10/10/2021 1143   MCH 29.9 09/14/2021 2010   MCHC 33.2 10/10/2021 1143   RDW 13.8 10/10/2021 1143   LYMPHSABS 1.9 10/10/2021 1143   MONOABS 0.4 10/10/2021 1143   EOSABS 0.1 10/10/2021 1143   BASOSABS 0.0 10/10/2021 1143     Pulmonary Functions Testing Results:    Latest Ref Rng & Units 10/28/2021   10:34 AM  PFT Results  FVC-Pre L 2.91   FVC-Predicted Pre % 102   FVC-Post L 2.97   FVC-Predicted Post % 104   Pre FEV1/FVC % % 83   Post FEV1/FCV % % 85   FEV1-Pre L 2.41   FEV1-Predicted Pre % 113   FEV1-Post L 2.52   DLCO uncorrected ml/min/mmHg 17.90   DLCO UNC% % 94   DLVA Predicted % 97   TLC L 4.74   TLC % Predicted % 95   RV % Predicted % 82     Outpatient Medications Prior to Visit  Medication Sig Dispense Refill   Calcium Carbonate-Vitamin D (CALCIUM-VITAMIN D) 500-200 MG-UNIT per tablet Take 1 tablet by mouth daily.     famotidine (PEPCID) 40 MG tablet Take 1 tablet (40 mg total) by mouth at bedtime. 30 tablet 3   FLUoxetine (PROZAC) 40 MG capsule Take 1 capsule (40 mg total) by mouth daily. 90 capsule 3   fluticasone (FLONASE) 50 MCG/ACT nasal spray Place 2 sprays into both nostrils daily. (Patient taking differently: Place 2 sprays into both nostrils daily as needed for allergies.) 48 g 3   Fluticasone Furoate (ARNUITY ELLIPTA) 100 MCG/ACT AEPB Inhale 1 puff into the lungs daily. 30 each 3   hydrochlorothiazide (MICROZIDE) 12.5 MG capsule Take 1 capsule (12.5 mg total) by mouth daily. 90 capsule 3   meloxicam (MOBIC) 15 MG tablet      Multiple Vitamins-Minerals (MULTIVITAMIN PO) Take 1 tablet by mouth daily.     pantoprazole (PROTONIX) 40 MG tablet Take 1 tablet (40 mg total) by mouth daily. 90 tablet 3   pravastatin (PRAVACHOL) 80 MG tablet Take 1 tablet (80 mg total) by mouth daily. 90 tablet 3   sucralfate (CARAFATE) 1 g tablet Take 1 tablet (1 g total) by mouth 4 (four) times daily -  with meals and at bedtime. 120 tablet 1   traZODone (DESYREL) 50 MG tablet TAKE 1/2 TO 1 TABLET AT BEDTIME AS NEEDED FOR SLEEP 90 tablet 3   No facility-administered medications prior to visit.

## 2021-12-13 ENCOUNTER — Ambulatory Visit
Admission: RE | Admit: 2021-12-13 | Discharge: 2021-12-13 | Disposition: A | Discharge status: Home / Self Care | Payer: Medicare HMO | Source: Ambulatory Visit | Attending: Family Medicine | Admitting: Family Medicine

## 2021-12-13 DIAGNOSIS — Z1231 Encounter for screening mammogram for malignant neoplasm of breast: Secondary | ICD-10-CM | POA: Insufficient documentation

## 2021-12-16 ENCOUNTER — Encounter: Payer: Self-pay | Admitting: Family Medicine

## 2021-12-16 ENCOUNTER — Ambulatory Visit (INDEPENDENT_AMBULATORY_CARE_PROVIDER_SITE_OTHER): Payer: Medicare HMO | Admitting: Family Medicine

## 2021-12-16 VITALS — BP 134/82 | HR 85 | Temp 97.6°F | Ht 63.5 in | Wt 165.0 lb

## 2021-12-16 DIAGNOSIS — R300 Dysuria: Secondary | ICD-10-CM | POA: Diagnosis not present

## 2021-12-16 DIAGNOSIS — R319 Hematuria, unspecified: Secondary | ICD-10-CM

## 2021-12-16 LAB — POC URINALSYSI DIPSTICK (AUTOMATED)
Glucose, UA: NEGATIVE
Ketones, UA: NEGATIVE
Leukocytes, UA: NEGATIVE
Nitrite, UA: NEGATIVE
Protein, UA: POSITIVE — AB
Spec Grav, UA: 1.015 (ref 1.010–1.025)
Urobilinogen, UA: 0.2 E.U./dL
pH, UA: 6 (ref 5.0–8.0)

## 2021-12-16 MED ORDER — SULFAMETHOXAZOLE-TRIMETHOPRIM 800-160 MG PO TABS: 1.0000 | ORAL_TABLET | Freq: Two times a day (BID) | ORAL | 0 refills | Status: DC

## 2021-12-16 NOTE — Assessment & Plan Note (Signed)
H/o this, again noted today however with UTI symptoms - will treat UTI with bactrim course and I asked her to return in 1 month for rpt UA. Future UA ordered.

## 2021-12-16 NOTE — Assessment & Plan Note (Addendum)
Story/exam suspicious for UTI.  UA/micro with significant hematuria.  No ho kidney stones, not consistent with kidney stones.  Send UCx.  Rx bactrim DS 3d course. Update if ongoing symptoms after treatment.  She previously completed reassuring hematuria workup remotely.  Non smoker.

## 2021-12-16 NOTE — Progress Notes (Signed)
Patient ID: Connie Rowe, female    DOB: 07/14/1950, 71 y.o.   MRN: [id number]  This visit was conducted in person.  BP 134/82   Pulse 85   Temp 97.6 F (36.4 C) (Temporal)   Ht 5' 3.5" (1.613 m)   Wt 165 lb (74.8 kg)   SpO2 95%   BMI 28.77 kg/m    CC: "I think I may have a UTI" Subjective:   HPI: Connie Rowe is a 71 y.o. female presenting on 12/16/2021 for Dysuria (C/o burning/itching when urinating and frequency.  Sxs started 12/14/21.)   2d h/o dysuria, urgency, frequency. She's been more tired than usual. Some suprapubic discomfort for a few hours this week.  No hematuria, fevers/chills, flank pain, nausea/vomiting.   05/2021 she also had UTI, none previously in years.  No recent abx.   H/o microhematuria ~2008 - s/p reassuring remote workup.  No h/o kidney stone.   Non smoker but she did have 2nd hand smoke exposure growing up.      Relevant past medical, surgical, family and social history reviewed and updated as indicated. Interim medical history since our last visit reviewed. Allergies and medications reviewed and updated. Outpatient Medications Prior to Visit  Medication Sig Dispense Refill   Calcium Carbonate-Vitamin D (CALCIUM-VITAMIN D) 500-200 MG-UNIT per tablet Take 1 tablet by mouth daily.     famotidine (PEPCID) 40 MG tablet Take 1 tablet (40 mg total) by mouth at bedtime. 30 tablet 3   FLUoxetine (PROZAC) 40 MG capsule Take 1 capsule (40 mg total) by mouth daily. 90 capsule 3   fluticasone (FLONASE) 50 MCG/ACT nasal spray Place 2 sprays into both nostrils daily. (Patient taking differently: Place 2 sprays into both nostrils daily as needed for allergies.) 48 g 3   Fluticasone Furoate (ARNUITY ELLIPTA) 100 MCG/ACT AEPB Inhale 1 puff into the lungs daily. 30 each 3   hydrochlorothiazide (MICROZIDE) 12.5 MG capsule Take 1 capsule (12.5 mg total) by mouth daily. 90 capsule 3   meloxicam (MOBIC) 15 MG tablet      Multiple Vitamins-Minerals  (MULTIVITAMIN PO) Take 1 tablet by mouth daily.     pantoprazole (PROTONIX) 40 MG tablet Take 1 tablet (40 mg total) by mouth daily. 90 tablet 3   pravastatin (PRAVACHOL) 80 MG tablet Take 1 tablet (80 mg total) by mouth daily. 90 tablet 3   sucralfate (CARAFATE) 1 g tablet Take 1 tablet (1 g total) by mouth 4 (four) times daily -  with meals and at bedtime. 120 tablet 1   traZODone (DESYREL) 50 MG tablet TAKE 1/2 TO 1 TABLET AT BEDTIME AS NEEDED FOR SLEEP 90 tablet 3   No facility-administered medications prior to visit.     Per HPI unless specifically indicated in ROS section below Review of Systems  Objective:  BP 134/82   Pulse 85   Temp 97.6 F (36.4 C) (Temporal)   Ht 5' 3.5" (1.613 m)   Wt 165 lb (74.8 kg)   SpO2 95%   BMI 28.77 kg/m   Wt Readings from Last 3 Encounters:  12/16/21 165 lb (74.8 kg)  11/01/21 162 lb (73.5 kg)  10/27/21 163 lb (73.9 kg)      Physical Exam Vitals and nursing note reviewed.  Constitutional:      Appearance: Normal appearance. She is not ill-appearing.  HENT:     Mouth/Throat:     Mouth: Mucous membranes are moist.     Pharynx: Oropharynx is clear. No oropharyngeal  exudate or posterior oropharyngeal erythema.  Eyes:     Extraocular Movements: Extraocular movements intact.  Cardiovascular:     Rate and Rhythm: Normal rate and regular rhythm.     Pulses: Normal pulses.     Heart sounds: Normal heart sounds. No murmur heard. Pulmonary:     Effort: Pulmonary effort is normal. No respiratory distress.     Breath sounds: Normal breath sounds. No wheezing, rhonchi or rales.  Abdominal:     General: Abdomen is flat. Bowel sounds are normal. There is no distension.     Palpations: Abdomen is soft. There is no mass.     Tenderness: There is abdominal tenderness (mild) in the suprapubic area. There is no right CVA tenderness, left CVA tenderness, guarding or rebound. Negative signs include Murphy's sign.     Hernia: No hernia is present.   Musculoskeletal:     Right lower leg: No edema.     Left lower leg: No edema.  Skin:    General: Skin is warm and dry.     Findings: No rash.  Neurological:     Mental Status: She is alert.  Psychiatric:        Mood and Affect: Mood normal.        Behavior: Behavior normal.       Results for orders placed or performed in visit on 12/16/21  POCT Urinalysis Dipstick (Automated)  Result Value Ref Range   Color, UA yellow    Clarity, UA cloudy    Glucose, UA Negative Negative   Bilirubin, UA 1+    Ketones, UA negative    Spec Grav, UA 1.015 1.010 - 1.025   Blood, UA 3+    pH, UA 6.0 5.0 - 8.0   Protein, UA Positive (A) Negative   Urobilinogen, UA 0.2 0.2 or 1.0 E.U./dL   Nitrite, UA negative    Leukocytes, UA Negative Negative    Lab Results  Component Value Date   CREATININE 1.15 10/10/2021   BUN 14 10/10/2021   NA 140 10/10/2021   K 3.9 10/10/2021   CL 103 10/10/2021   CO2 31 10/10/2021  GFR = 47  Lab Results  Component Value Date   HGBA1C 6.3 10/10/2021     Assessment & Plan:   Problem List Items Addressed This Visit     Hematuria    H/o this, again noted today however with UTI symptoms - will treat UTI with bactrim course and I asked her to return in 1 month for rpt UA. Future UA ordered.       Relevant Orders   Urinalysis, Routine w reflex microscopic   Dysuria - Primary    Story/exam suspicious for UTI.  UA/micro with significant hematuria.  No ho kidney stones, not consistent with kidney stones.  Send UCx.  Rx bactrim DS 3d course. Update if ongoing symptoms after treatment.  She previously completed reassuring hematuria workup remotely.  Non smoker.       Relevant Orders   POCT Urinalysis Dipstick (Automated) (Completed)   Urine Culture     Meds ordered this encounter  Medications   sulfamethoxazole-trimethoprim (BACTRIM DS) 800-160 MG tablet    Sig: Take 1 tablet by mouth 2 (two) times daily.    Dispense:  6 tablet    Refill:  0    Orders Placed This Encounter  Procedures   Urine Culture   Urinalysis, Routine w reflex microscopic    Standing Status:   Future    Standing Expiration  Date:   12/17/2022   POCT Urinalysis Dipstick (Automated)     Patient Instructions  Given symptoms, I recommend treatment for UTI  with bactrim twice daily for 3 days.  Push fluids and rest.  May use tylenol for discomfort as needed.  Urine culture sent. Let us know if ongoing symptoms after finishing antibiotic.  When you return in December for labs, I will order urinalysis to ensure blood in urine is better. If it isn't follow up with Dr Glori Bickers.   Follow up plan: Return if symptoms worsen or fail to improve.  [name] Bush, MD

## 2021-12-16 NOTE — Patient Instructions (Addendum)
Given symptoms, I recommend treatment for UTI  with bactrim twice daily for 3 days.  Push fluids and rest.  May use tylenol for discomfort as needed.  Urine culture sent. Let us know if ongoing symptoms after finishing antibiotic.  When you return in December for labs, I will order urinalysis to ensure blood in urine is better. If it isn't follow up with Dr Glori Bickers.

## 2021-12-17 LAB — URINE CULTURE
MICRO NUMBER:: 14205294
SPECIMEN QUALITY:: ADEQUATE

## 2022-01-17 ENCOUNTER — Other Ambulatory Visit (INDEPENDENT_AMBULATORY_CARE_PROVIDER_SITE_OTHER): Payer: Medicare HMO

## 2022-01-17 DIAGNOSIS — R319 Hematuria, unspecified: Secondary | ICD-10-CM | POA: Diagnosis not present

## 2022-01-17 LAB — URINALYSIS, ROUTINE W REFLEX MICROSCOPIC
Bilirubin Urine: NEGATIVE
Ketones, ur: NEGATIVE
Leukocytes,Ua: NEGATIVE
Nitrite: NEGATIVE
Specific Gravity, Urine: 1.02 (ref 1.000–1.030)
Total Protein, Urine: NEGATIVE
Urine Glucose: NEGATIVE
Urobilinogen, UA: 0.2 (ref 0.0–1.0)
pH: 6 (ref 5.0–8.0)

## 2022-01-18 ENCOUNTER — Other Ambulatory Visit: Payer: Self-pay | Admitting: Family Medicine

## 2022-01-18 DIAGNOSIS — R319 Hematuria, unspecified: Secondary | ICD-10-CM

## 2022-01-19 ENCOUNTER — Telehealth: Payer: Self-pay | Admitting: Family Medicine

## 2022-01-19 DIAGNOSIS — R319 Hematuria, unspecified: Secondary | ICD-10-CM

## 2022-01-19 NOTE — Telephone Encounter (Signed)
Previous referral to [facility] cancelled.   Pt notified of new referral.  Nothing further needed.

## 2022-01-19 NOTE — Telephone Encounter (Signed)
Patient called and stated she wants to go with [facility] instead [facility] for her referral. Call back number [phone].

## 2022-01-19 NOTE — Telephone Encounter (Signed)
I put a new referral in  Dr Darnell Level did the last one-please cancel it or change Thanks

## 2022-02-16 ENCOUNTER — Other Ambulatory Visit: Payer: Self-pay | Admitting: *Deleted

## 2022-02-16 MED ORDER — PRAVASTATIN SODIUM 80 MG PO TABS: 80.0000 mg | ORAL_TABLET | Freq: Every day | ORAL | 1 refills | Status: DC

## 2022-02-21 ENCOUNTER — Ambulatory Visit: Payer: Self-pay | Admitting: Urology

## 2022-03-24 DIAGNOSIS — R310 Gross hematuria: Secondary | ICD-10-CM | POA: Diagnosis not present

## 2022-03-24 DIAGNOSIS — R319 Hematuria, unspecified: Secondary | ICD-10-CM | POA: Diagnosis not present

## 2022-03-24 DIAGNOSIS — K5730 Diverticulosis of large intestine without perforation or abscess without bleeding: Secondary | ICD-10-CM | POA: Diagnosis not present

## 2022-03-27 DIAGNOSIS — R310 Gross hematuria: Secondary | ICD-10-CM | POA: Diagnosis not present

## 2022-03-28 ENCOUNTER — Other Ambulatory Visit: Payer: Self-pay | Admitting: Urology

## 2022-04-06 ENCOUNTER — Other Ambulatory Visit: Payer: Self-pay

## 2022-04-06 ENCOUNTER — Encounter (HOSPITAL_BASED_OUTPATIENT_CLINIC_OR_DEPARTMENT_OTHER): Payer: Self-pay | Admitting: Urology

## 2022-04-06 NOTE — Progress Notes (Signed)
Spoke w/ via phone for pre-op interview---pt Lab needs dos---- I stat              Lab results------EKG 09/15/21 in epic COVID test -----patient states asymptomatic no test needed Arrive at -------0700 NPO after MN NO Solid Food.  Clear liquids from MN until---0600 Med rec completed Medications to take morning of surgery -----none Diabetic medication -----n/a Patient instructed no nail polish to be worn day of surgery Patient instructed to bring photo id and insurance card day of surgery Patient aware to have Driver (ride ) /  Gavin [place]ound Husband caregiver    for 24 hours after surgery  Patient Special Instructions -----none Pre-Op special Istructions -----none Patient verbalized understanding of instructions that were given at this phone interview. Patient denies shortness of breath, chest pain, fever, cough at this phone interview.

## 2022-04-10 NOTE — Anesthesia Preprocedure Evaluation (Signed)
Anesthesia Evaluation  Patient identified by MRN, date of birth, ID band Patient awake    Reviewed: Allergy & Precautions, NPO status , Patient's Chart, lab work & pertinent test results  History of Anesthesia Complications Negative for: history of anesthetic complications  Airway Mallampati: II  TM Distance: >3 FB Neck ROM: Full    Dental no notable dental hx.    Pulmonary sleep apnea and Continuous Positive Airway Pressure Ventilation    Pulmonary exam normal        Cardiovascular hypertension, Pt. on medications Normal cardiovascular exam     Neuro/Psych   Anxiety Depression    negative neurological ROS     GI/Hepatic Neg liver ROS, hiatal hernia,GERD  Medicated,,  Endo/Other  diabetes, Type 2    Renal/GU negative Renal ROS   GROSS HEMATURIA, BLADDER LESION    Musculoskeletal  (+) Arthritis ,    Abdominal   Peds  Hematology negative hematology ROS (+)   Anesthesia Other Findings Day of surgery medications reviewed with patient.  Reproductive/Obstetrics negative OB ROS                              Anesthesia Physical Anesthesia Plan  ASA: 2  Anesthesia Plan: General   Post-op Pain Management: Tylenol PO (pre-op)*   Induction: Intravenous  PONV Risk Score and Plan: 3 and Treatment may vary due to age or medical condition, Dexamethasone and Ondansetron  Airway Management Planned: LMA  Additional Equipment: None  Intra-op Plan:   Post-operative Plan: Extubation in OR  Informed Consent: I have reviewed the patients History and Physical, chart, labs and discussed the procedure including the risks, benefits and alternatives for the proposed anesthesia with the patient or authorized representative who has indicated his/her understanding and acceptance.     Dental advisory given  Plan Discussed with: CRNA  Anesthesia Plan Comments:         Anesthesia Quick  Evaluation

## 2022-04-10 NOTE — H&P (Signed)
Patient is a 72 year old white female seen today for evaluation of gross painless hematuria. Patient states she initially presented back in May 2023 with pressure with voiding and an episode of gross hematuria. She was treated with antibiotic but culture ended up being negative. She had recurrent blood in the urine approximately 2 months ago seen again by her primary care physician urine culture showed mixed urogenital flora she was treated with Bactrim DS and hematuria resolved. She has had none visible since that time. Her urologic history is significant for mid urethral sling done by Dr. Vikki Ports here in 2008. Patient is a non-smoker, no prior history of kidney stones patient also having some mild urgency symptoms and some mild stress incontinence which has been recurrent since the sling within the last year or so. She is not wearing any pads for this. Stays dry at night.  Micro urinalysis today shows 40-60 RBCs and rare bacteria.   -03/27/22-patient with history of gross painless hematuria. Is had CT renal scan in the interim showed essentially normal upper urinary tract. Distal ureters not well-seen due to the spray artifact from hip replacement. See CT report below here for cystoscopy to assess bladder.  Micro urinalysis shows 10-20 RBCs  Cystoscopy is performed today and shows: Small slightly raised erythematous area in the right posterior bladder wall measuring 1 to 2 cm perhaps in size. Remainder the bladder was grossly normal ureteral orifice ease were effluxing clear urine.   CLINICAL DATA: Gross hematuria.   EXAM:  CT ABDOMEN AND PELVIS WITHOUT AND WITH CONTRAST   TECHNIQUE:  Multidetector CT imaging of the abdomen and pelvis was performed  following the standard protocol before and following the bolus  administration of intravenous contrast.   RADIATION DOSE REDUCTION: This exam was performed according to the  departmental dose-optimization program which includes automated  exposure  control, adjustment of the mA and/or kV according to  patient size and/or use of iterative reconstruction technique.   CONTRAST: 100 cc Omnipaque 300   COMPARISON: None Available.   FINDINGS:  Lower chest: Unremarkable.   Hepatobiliary: No suspicious focal abnormality within the liver  parenchyma. There is no evidence for gallstones, gallbladder wall  thickening, or pericholecystic fluid. No intrahepatic or  extrahepatic biliary dilation.   Pancreas: No focal mass lesion. No dilatation of the main duct. No  intraparenchymal cyst. No peripancreatic edema.   Spleen: No splenomegaly. No focal mass lesion.   Adrenals/Urinary Tract: No adrenal nodule or mass.   Pre contrast imaging shows no stones in either kidney or proximal  ureter. Distal ureters and bladder obscured by beam hardening  artifact from bilateral hip replacement.   Imaging after IV contrast administration shows no suspicious  enhancing mass lesion in either kidney.   Delayed post-contrast imaging shows no wall thickening or soft  tissue filling defect in either intrarenal collecting system or  renal pelvis. Proximal and mid ureters unremarkable on delayed  imaging. Distal ureters and bladder largely obscured by beam  hardening artifact from hip replacement.   Stomach/Bowel: Stomach is unremarkable. No gastric wall thickening.  No evidence of outlet obstruction. Duodenum is normally positioned  as is the ligament of Treitz. No small bowel wall thickening. No  small bowel dilatation. The terminal ileum is normal. The appendix  is normal. No gross colonic mass. No colonic wall thickening. Mild  left colonic diverticulosis without diverticulitis.   Vascular/Lymphatic: There is mild atherosclerotic calcification of  the abdominal aorta without aneurysm. There is no gastrohepatic or  hepatoduodenal ligament lymphadenopathy. No retroperitoneal or  mesenteric lymphadenopathy. No pelvic sidewall lymphadenopathy.    Reproductive: Uterus largely obscured by beam hardening artifact. No  adnexal mass evident.   Other: No intraperitoneal free fluid.   Musculoskeletal: Status post bilateral hip replacement. Degenerative  facet disease noted lumbar spine with degenerative disc disease at  L2-3.Marland Kitchen No worrisome lytic or sclerotic osseous abnormality.   IMPRESSION:  1. No acute findings in the abdomen or pelvis. Specifically, no  findings to explain the patient's history of hematuria. Distal  ureters and bladder are largely obscured by beam hardening artifact  from bilateral hip replacement.  2. Mild left colonic diverticulosis without diverticulitis.  3. Aortic Atherosclerosis (ICD10-I70.0).    Electronically Signed  By: Misty Stanley M.D.  On: 03/25/2022 12:07      ALLERGIES: No Allergies    MEDICATIONS: Hydrochlorothiazide 12.5 mg tablet  Calcium Citrate- Vitamin D 315 mg calcium-6.25 mcg (250 unit) tablet Oral  Fluoxetine Hcl 40 mg capsule  Multivitamin  Pantoprazole Sodium 40 mg tablet, delayed release  Pravastatin Sodium 80 mg tablet  Vitamin B12     GU PSH: Sling - 2008       PSH Notes: Vaginal Sling Operation For Stress Incontinence   NON-GU PSH: Hip Replacement, Bilateral     GU PMH: Gross hematuria - 03/24/2022, - 02/27/2022 Stress Incontinence - 02/27/2022, Female stress incontinence, - 2014 Nocturia, Frequent urination at night - 2014      PMH Notes:  1898-01-30 00:00:00 - Note: Normal Routine History And Physical Adult   NON-GU PMH: Personal history of other diseases of the digestive system, History of esophageal reflux - 2014 Personal history of other endocrine, nutritional and metabolic disease, History of hypercholesterolemia - 2014 Anxiety Arthritis Diabetes Type 2 GERD Hypercholesterolemia Sleep Apnea    FAMILY HISTORY: Colon Cancer - Runs In Family Death In The Family Father - Mother nephrolithiasis - Runs In Family renal failure - Runs In Family    SOCIAL HISTORY: Marital Status: Married Ethnicity: Not Hispanic Or Latino; Race: White Current Smoking Status: Patient has never smoked.   Tobacco Use Assessment Completed: Used Tobacco in last 30 days? Does not use smokeless tobacco. Does drink.  Does not use drugs. Drinks 2 caffeinated drinks per day.     Notes: Tobacco Use, Alcohol Use, Marital History - Currently Married, Occupation:, Caffeine Use   REVIEW OF SYSTEMS:    GU Review Female:   Patient denies frequent urination, hard to postpone urination, burning /pain with urination, get up at night to urinate, leakage of urine, stream starts and stops, trouble starting your stream, have to strain to urinate, and being pregnant.  Gastrointestinal (Upper):   Patient denies nausea, vomiting, and indigestion/ heartburn.  Gastrointestinal (Lower):   Patient denies diarrhea and constipation.  Constitutional:   Patient denies fever, night sweats, weight loss, and fatigue.  Skin:   Patient denies skin rash/ lesion and itching.  Eyes:   Patient denies blurred vision and double vision.  Ears/ Nose/ Throat:   Patient denies sore throat and sinus problems.  Hematologic/Lymphatic:   Patient denies swollen glands and easy bruising.  Cardiovascular:   Patient denies chest pains and leg swelling.  Respiratory:   Patient denies cough and shortness of breath.  Endocrine:   Patient denies excessive thirst.  Musculoskeletal:   Patient denies back pain and joint pain.  Neurological:   Patient denies headaches and dizziness.  Psychologic:   Patient denies depression and anxiety.   VITAL SIGNS: None  GU PHYSICAL EXAMINATION:    External Genitalia: No hirsutism, no rash, no scarring, no cyst, no erythematous lesion, no papular lesion, no blanched lesion, no warty lesion. No edema.  Urethral Meatus: Normal size. Normal position. No discharge.  Urethra: No tenderness, no mass, no scarring. No hypermobility. No leakage.  Bladder: Normal to palpation,  no tenderness, no mass, normal size.  Vagina: No atrophy, no stenosis. No rectocele. No cystocele. No enterocele.   MULTI-SYSTEM PHYSICAL EXAMINATION:    Constitutional: Well-nourished. No physical deformities. Normally developed. Good grooming.  Neck: Neck symmetrical, not swollen. Normal tracheal position.  Respiratory: No labored breathing, no use of accessory muscles.   Cardiovascular: Normal temperature, normal extremity pulses, no swelling, no varicosities.  Lymphatic: No enlargement of neck, axillae, groin.  Skin: No paleness, no jaundice, no cyanosis. No lesion, no ulcer, no rash.  Neurologic / Psychiatric: Oriented to time, oriented to place, oriented to person. No depression, no anxiety, no agitation.  Eyes: Normal conjunctivae. Normal eyelids.  Ears, Nose, Mouth, and Throat: Left ear no scars, no lesions, no masses. Right ear no scars, no lesions, no masses. Nose no scars, no lesions, no masses. Normal hearing. Normal lips.  Musculoskeletal: Normal gait and station of head and neck.     PAST DATA REVIEW: None   PROCEDURES:         Flexible Cystoscopy - 52000  Risks, benefits, and some of the potential complications of the procedure were discussed at length with the patient including infection, bleeding, voiding discomfort, urinary retention, fever, chills, sepsis, and others. All questions were answered. Informed consent was obtained. Antibiotic prophylaxis was given. Sterile technique and intraurethral analgesia were used.  Meatus:  Normal size. Normal location. Normal condition.  Urethra:  No hypermobility. No leakage.  Ureteral Orifices:  Normal location. Normal size. Normal shape. Effluxed clear urine.  Bladder:  Cystoscopy is performed today and shows: Small slightly raised erythematous area in the right posterior bladder wall measuring 1 to 2 cm perhaps in size. Remainder the bladder was grossly normal ureteral orifice ease were effluxing clear urine.      The lower  urinary tract was carefully examined. The procedure was well-tolerated and without complications. Antibiotic instructions were given. Instructions were given to call the office immediately for bloody urine, difficulty urinating, urinary retention, painful or frequent urination, fever, chills, nausea, vomiting or other illness. The patient stated that she understood these instructions and would comply with them.         Urinalysis w/Scope - 81001 Dipstick Dipstick Cont'd Micro  Color: Yellow Bilirubin: Neg mg/dL WBC/hpf: NS (Not Seen)  Appearance: Slightly Cloudy Ketones: Neg mg/dL RBC/hpf: 10 - 20/hpf  Specific Gravity: 1.020 Blood: 3+ ery/uL Bacteria: NS (Not Seen)  pH: <=5.0 Protein: Neg mg/dL Cystals: Ca Oxalate  Glucose: Neg mg/dL Urobilinogen: 0.2 mg/dL Casts: Hyaline    Nitrites: Neg Trichomonas: Not Present    Leukocyte Esterase: Neg leu/uL Mucous: Not Present      Epithelial Cells: 6 - 10/hpf      Yeast: Few (1 - 5/hpf)      Sperm: Not Present    ASSESSMENT:      ICD-10 Details  1 GU:   Gross hematuria - 123456 Acute, Complicated Injury   PLAN:           Document Letter(s):  Created for Patient: Clinical Summary         Notes:   Discussed cystoscopic findings with the patient. Recommended cystoscopy bladder biopsy with fulguration. Also will  perform bilateral retrogrades to get better look at the distal ureters as they were not well-visualized on the CT scan. Some benefits that procedure were discussed in detail as outlined below. She is agreeable to proceed electively in the near future.  TURBT consent: I have discussed with the patient the risks, benefits of TURBT which include but are not limited to: Bleeding, infection, damage to the bladder with potential perforation of the bladder, damage to surrounding organs, possible need for further procedures including open repair and catheterization, possibility of nonhealing area within the bladder, urgency, frequency which may be  refractory to medications. I pointed out that in some occasions after resection of the bladder tumor, mitomycin-C chemotherapy may be instilled into the bladder. The risks associated with this therapy include but are not limited to: Refractory or new onset urgency, frequency, dysuria, infrequently severe systemic side effects secondary to mitomycin-C. After full discussion of the risks, benefits and alternatives, the patient has consented to the above procedure and desires to proceed.

## 2022-04-11 ENCOUNTER — Encounter (HOSPITAL_BASED_OUTPATIENT_CLINIC_OR_DEPARTMENT_OTHER)
Admission: RE | Disposition: A | Discharge status: Home / Self Care | Payer: Self-pay | Source: Ambulatory Visit | Attending: Urology

## 2022-04-11 ENCOUNTER — Ambulatory Visit (HOSPITAL_BASED_OUTPATIENT_CLINIC_OR_DEPARTMENT_OTHER): Payer: Medicare HMO | Admitting: Anesthesiology

## 2022-04-11 ENCOUNTER — Other Ambulatory Visit: Payer: Self-pay

## 2022-04-11 ENCOUNTER — Encounter (HOSPITAL_BASED_OUTPATIENT_CLINIC_OR_DEPARTMENT_OTHER): Payer: Self-pay | Admitting: Urology

## 2022-04-11 ENCOUNTER — Ambulatory Visit (HOSPITAL_BASED_OUTPATIENT_CLINIC_OR_DEPARTMENT_OTHER)
Admission: RE | Admit: 2022-04-11 | Discharge: 2022-04-11 | Disposition: A | Discharge status: Home / Self Care | Payer: Medicare HMO | Source: Ambulatory Visit | Attending: Urology | Admitting: Urology

## 2022-04-11 DIAGNOSIS — F418 Other specified anxiety disorders: Secondary | ICD-10-CM | POA: Insufficient documentation

## 2022-04-11 DIAGNOSIS — R310 Gross hematuria: Secondary | ICD-10-CM | POA: Insufficient documentation

## 2022-04-11 DIAGNOSIS — K449 Diaphragmatic hernia without obstruction or gangrene: Secondary | ICD-10-CM | POA: Insufficient documentation

## 2022-04-11 DIAGNOSIS — R7303 Prediabetes: Secondary | ICD-10-CM

## 2022-04-11 DIAGNOSIS — K219 Gastro-esophageal reflux disease without esophagitis: Secondary | ICD-10-CM | POA: Insufficient documentation

## 2022-04-11 DIAGNOSIS — D303 Benign neoplasm of bladder: Secondary | ICD-10-CM | POA: Diagnosis not present

## 2022-04-11 DIAGNOSIS — I10 Essential (primary) hypertension: Secondary | ICD-10-CM | POA: Diagnosis not present

## 2022-04-11 DIAGNOSIS — Z96643 Presence of artificial hip joint, bilateral: Secondary | ICD-10-CM | POA: Insufficient documentation

## 2022-04-11 DIAGNOSIS — N329 Bladder disorder, unspecified: Secondary | ICD-10-CM | POA: Insufficient documentation

## 2022-04-11 DIAGNOSIS — R3121 Asymptomatic microscopic hematuria: Secondary | ICD-10-CM | POA: Diagnosis not present

## 2022-04-11 DIAGNOSIS — E119 Type 2 diabetes mellitus without complications: Secondary | ICD-10-CM | POA: Insufficient documentation

## 2022-04-11 HISTORY — PX: CYSTOSCOPY W/ RETROGRADES: SHX1426

## 2022-04-11 HISTORY — PX: CYSTOSCOPY WITH BIOPSY: SHX5122

## 2022-04-11 HISTORY — DX: Presence of spectacles and contact lenses: Z97.3

## 2022-04-11 LAB — POCT I-STAT, CHEM 8
BUN: 20 mg/dL (ref 8–23)
Calcium, Ion: 1.15 mmol/L (ref 1.15–1.40)
Chloride: 102 mmol/L (ref 98–111)
Creatinine, Ser: 1.8 mg/dL — ABNORMAL HIGH (ref 0.44–1.00)
Glucose, Bld: 107 mg/dL — ABNORMAL HIGH (ref 70–99)
HCT: 37 % (ref 36.0–46.0)
Hemoglobin: 12.6 g/dL (ref 12.0–15.0)
Potassium: 3.6 mmol/L (ref 3.5–5.1)
Sodium: 141 mmol/L (ref 135–145)
TCO2: 29 mmol/L (ref 22–32)

## 2022-04-11 SURGERY — CYSTOSCOPY, WITH BIOPSY
Anesthesia: General | Site: Ureter

## 2022-04-11 MED ORDER — ONDANSETRON HCL 4 MG/2ML IJ SOLN
INTRAMUSCULAR | Status: DC | PRN
  Administered 2022-04-11: 4 mg via INTRAVENOUS

## 2022-04-11 MED ORDER — DEXAMETHASONE SODIUM PHOSPHATE 10 MG/ML IJ SOLN
INTRAMUSCULAR | Status: DC | PRN
  Administered 2022-04-11: 5 mg via INTRAVENOUS

## 2022-04-11 MED ORDER — LACTATED RINGERS IV SOLN: INTRAVENOUS | Status: DC

## 2022-04-11 MED ORDER — PROPOFOL 10 MG/ML IV BOLUS
INTRAVENOUS | Status: DC | PRN
  Administered 2022-04-11: 140 mg via INTRAVENOUS
  Administered 2022-04-11: 50 mg via INTRAVENOUS

## 2022-04-11 MED ORDER — ACETAMINOPHEN 500 MG PO TABS
1000.0000 mg | ORAL_TABLET | Freq: Once | ORAL | Status: AC
  Administered 2022-04-11: 1000 mg via ORAL

## 2022-04-11 MED ORDER — DEXAMETHASONE SODIUM PHOSPHATE 10 MG/ML IJ SOLN
INTRAMUSCULAR | Status: AC
  Filled 2022-04-11: qty 1

## 2022-04-11 MED ORDER — ONDANSETRON HCL 4 MG/2ML IJ SOLN
INTRAMUSCULAR | Status: AC
  Filled 2022-04-11: qty 2

## 2022-04-11 MED ORDER — EPHEDRINE SULFATE-NACL 50-0.9 MG/10ML-% IV SOSY
PREFILLED_SYRINGE | INTRAVENOUS | Status: DC | PRN
  Administered 2022-04-11: 10 mg via INTRAVENOUS
  Administered 2022-04-11: 15 mg via INTRAVENOUS

## 2022-04-11 MED ORDER — FENTANYL CITRATE (PF) 100 MCG/2ML IJ SOLN
INTRAMUSCULAR | Status: DC | PRN
  Administered 2022-04-11: 50 ug via INTRAVENOUS

## 2022-04-11 MED ORDER — CEFAZOLIN SODIUM-DEXTROSE 2-4 GM/100ML-% IV SOLN
2.0000 g | INTRAVENOUS | Status: AC
  Administered 2022-04-11: 2 g via INTRAVENOUS

## 2022-04-11 MED ORDER — CEFAZOLIN SODIUM-DEXTROSE 2-4 GM/100ML-% IV SOLN
INTRAVENOUS | Status: AC
  Filled 2022-04-11: qty 100

## 2022-04-11 MED ORDER — IOHEXOL 300 MG/ML  SOLN
INTRAMUSCULAR | Status: DC | PRN
  Administered 2022-04-11: 7 mL via URETHRAL

## 2022-04-11 MED ORDER — FENTANYL CITRATE (PF) 100 MCG/2ML IJ SOLN: 25.0000 ug | INTRAMUSCULAR | Status: DC | PRN

## 2022-04-11 MED ORDER — EPHEDRINE 5 MG/ML INJ
INTRAVENOUS | Status: AC
  Filled 2022-04-11: qty 5

## 2022-04-11 MED ORDER — LIDOCAINE 2% (20 MG/ML) 5 ML SYRINGE
INTRAMUSCULAR | Status: DC | PRN
  Administered 2022-04-11: 100 mg via INTRAVENOUS

## 2022-04-11 MED ORDER — FENTANYL CITRATE (PF) 100 MCG/2ML IJ SOLN
INTRAMUSCULAR | Status: AC
  Filled 2022-04-11: qty 2

## 2022-04-11 MED ORDER — PHENYLEPHRINE 80 MCG/ML (10ML) SYRINGE FOR IV PUSH (FOR BLOOD PRESSURE SUPPORT)
PREFILLED_SYRINGE | INTRAVENOUS | Status: AC
  Filled 2022-04-11: qty 10

## 2022-04-11 MED ORDER — OXYCODONE HCL 5 MG/5ML PO SOLN: 5.0000 mg | Freq: Once | ORAL | Status: DC | PRN

## 2022-04-11 MED ORDER — PROPOFOL 10 MG/ML IV BOLUS
INTRAVENOUS | Status: AC
  Filled 2022-04-11: qty 20

## 2022-04-11 MED ORDER — STERILE WATER FOR IRRIGATION IR SOLN
Status: DC | PRN
  Administered 2022-04-11: 3000 mL

## 2022-04-11 MED ORDER — PHENYLEPHRINE 80 MCG/ML (10ML) SYRINGE FOR IV PUSH (FOR BLOOD PRESSURE SUPPORT)
PREFILLED_SYRINGE | INTRAVENOUS | Status: DC | PRN
  Administered 2022-04-11: 160 ug via INTRAVENOUS

## 2022-04-11 MED ORDER — 0.9 % SODIUM CHLORIDE (POUR BTL) OPTIME
TOPICAL | Status: DC | PRN
  Administered 2022-04-11: 500 mL

## 2022-04-11 MED ORDER — LIDOCAINE HCL (PF) 2 % IJ SOLN
INTRAMUSCULAR | Status: AC
  Filled 2022-04-11: qty 5

## 2022-04-11 MED ORDER — OXYCODONE HCL 5 MG PO TABS: 5.0000 mg | ORAL_TABLET | Freq: Once | ORAL | Status: DC | PRN

## 2022-04-11 MED ORDER — ACETAMINOPHEN 500 MG PO TABS
ORAL_TABLET | ORAL | Status: AC
  Filled 2022-04-11: qty 2

## 2022-04-11 MED ORDER — AMISULPRIDE (ANTIEMETIC) 5 MG/2ML IV SOLN: 10.0000 mg | Freq: Once | INTRAVENOUS | Status: DC | PRN

## 2022-04-11 SURGICAL SUPPLY — 29 items
BAG DRAIN URO-CYSTO SKYTR STRL (DRAIN) ×2 IMPLANT
BAG DRN UROCATH (DRAIN) ×2
BULB IRRIG PATHFIND (MISCELLANEOUS) IMPLANT
CATH URET 5FR 28IN CONE TIP (BALLOONS)
CATH URET 5FR 70CM CONE TIP (BALLOONS) IMPLANT
CATH URETL OPEN 5X70 (CATHETERS) IMPLANT
CLOTH BEACON ORANGE TIMEOUT ST (SAFETY) ×2 IMPLANT
ELECT REM PT RETURN 9FT ADLT (ELECTROSURGICAL) ×2
ELECTRODE REM PT RTRN 9FT ADLT (ELECTROSURGICAL) IMPLANT
GLOVE BIO SURGEON STRL SZ7.5 (GLOVE) ×2 IMPLANT
GLOVE BIOGEL PI IND STRL 6.5 (GLOVE) IMPLANT
GLOVE SURG SS PI 6.5 STRL IVOR (GLOVE) IMPLANT
GOWN STRL REUS W/TWL LRG LVL3 (GOWN DISPOSABLE) ×2 IMPLANT
GUIDEWIRE ANG ZIPWIRE 038X150 (WIRE) IMPLANT
GUIDEWIRE STR DUAL SENSOR (WIRE) IMPLANT
IV NS IRRIG 3000ML ARTHROMATIC (IV SOLUTION) ×2 IMPLANT
KIT TURNOVER CYSTO (KITS) ×2 IMPLANT
LASER FIB FLEXIVA PULSE ID 365 (Laser) IMPLANT
MANIFOLD NEPTUNE II (INSTRUMENTS) IMPLANT
NS IRRIG 500ML POUR BTL (IV SOLUTION) IMPLANT
PACK CYSTO (CUSTOM PROCEDURE TRAY) ×2 IMPLANT
SHEATH NAVIGATOR HD 11/13X36 (SHEATH) IMPLANT
SLEEVE SCD COMPRESS KNEE MED (STOCKING) ×2 IMPLANT
SYR 20ML LL LF (SYRINGE) ×2 IMPLANT
SYR TOOMEY IRRIG 70ML (MISCELLANEOUS)
SYRINGE TOOMEY IRRIG 70ML (MISCELLANEOUS) IMPLANT
TRACTIP FLEXIVA PULS ID 200XHI (Laser) IMPLANT
TRACTIP FLEXIVA PULSE ID 200 (Laser)
TUBE CONNECTING 12X1/4 (SUCTIONS) IMPLANT

## 2022-04-11 NOTE — Interval H&P Note (Signed)
History and Physical Interval Note:  04/11/2022 8:50 AM  Connie Rowe  has presented today for surgery, with the diagnosis of GROSS HEMATURIA, BLADDER LESION.  The various methods of treatment have been discussed with the patient and family. After consideration of risks, benefits and other options for treatment, the patient has consented to  Procedure(s): CYSTOSCOPY WITH BIOPSY AND FULGERATION (N/A) CYSTOSCOPY WITH RETROGRADE PYELOGRAM (Bilateral) as a surgical intervention.  The patient's history has been reviewed, patient examined, no change in status, stable for surgery.  I have reviewed the patient's chart and labs.  Questions were answered to the patient's satisfaction.     [name]

## 2022-04-11 NOTE — Transfer of Care (Signed)
Immediate Anesthesia Transfer of Care Note  Patient: Connie Rowe  Procedure(s) Performed: CYSTOSCOPY WITH BIOPSY AND FULGERATION (Bladder) CYSTOSCOPY WITH RETROGRADE PYELOGRAM (Bilateral: Ureter)  Patient Location: PACU  Anesthesia Type:General  Level of Consciousness: awake, alert , oriented, and patient cooperative  Airway & Oxygen Therapy: Patient Spontanous Breathing  Post-op Assessment: Report given to RN and Post -op Vital signs reviewed and stable  Post vital signs: Reviewed and stable  Last Vitals:  Vitals Value Taken Time  BP 164/70 04/11/22 0933  Temp    Pulse 80 04/11/22 0935  Resp 11 04/11/22 0935  SpO2 91 % 04/11/22 0935  Vitals shown include unvalidated device data.  Last Pain:  Vitals:   04/11/22 0719  TempSrc: Oral  PainSc: 0-No pain      Patients Stated Pain Goal: 5 ([id number])  Complications: No notable events documented.

## 2022-04-11 NOTE — Discharge Instructions (Addendum)
CYSTOSCOPY HOME CARE INSTRUCTIONS  Activity: Rest for the remainder of the day.  Do not drive or operate equipment today.  You may resume normal activities in one to two days as instructed by your physician.   Meals: Drink plenty of liquids and eat light foods such as gelatin or soup this evening.  You may return to a normal meal plan tomorrow.  Return to Work: You may return to work in one to two days or as instructed by your physician.  Special Instructions / Symptoms: Call your physician if any of these symptoms occur:   -persistent or heavy bleeding  -bleeding which continues after first few urination  -large blood clots that are difficult to pass  -urine stream diminishes or stops completely  -fever equal to or higher than 101 degrees Farenheit.  -cloudy urine with a strong, foul odor  -severe pain  Females should always wipe from front to back after elimination.  You may feel some burning pain when you urinate.  This should disappear with time.  Applying moist heat to the lower abdomen or a hot tub bath may help relieve the pain. \   No acetaminophen/Tylenol until after 1:24 pm today if needed.   Post Anesthesia Home Care Instructions  Activity: Get plenty of rest for the remainder of the day. A responsible individual must stay with you for 24 hours following the procedure.  For the next 24 hours, DO NOT: -Drive a car -Paediatric nurse -Drink alcoholic beverages -Take any medication unless instructed by your physician -Make any legal decisions or sign important papers.  Meals: Start with liquid foods such as gelatin or soup. Progress to regular foods as tolerated. Avoid greasy, spicy, heavy foods. If nausea and/or vomiting occur, drink only clear liquids until the nausea and/or vomiting subsides. Call your physician if vomiting continues.  Special Instructions/Symptoms: Your throat may feel dry or sore from the anesthesia or the breathing tube placed in your throat  during surgery. If this causes discomfort, gargle with warm salt water. The discomfort should disappear within 24 hours.

## 2022-04-11 NOTE — Anesthesia Procedure Notes (Signed)
Procedure Name: LMA Insertion Date/Time: 04/11/2022 9:00 AM  Performed by: Rogers Blocker, CRNAPre-anesthesia Checklist: Patient identified, Emergency Drugs available, Suction available and Patient being monitored Patient Re-evaluated:Patient Re-evaluated prior to induction Oxygen Delivery Method: Circle System Utilized Preoxygenation: Pre-oxygenation with 100% oxygen Induction Type: IV induction Ventilation: Mask ventilation without difficulty LMA: LMA inserted LMA Size: 4.0 Number of attempts: 1 Airway Equipment and Method: Bite block Placement Confirmation: positive ETCO2 Tube secured with: Tape Dental Injury: Teeth and Oropharynx as per pre-operative assessment

## 2022-04-11 NOTE — Op Note (Signed)
Preoperative diagnosis:  1.  Bladder lesion and microhematuria  Postoperative diagnosis: 1.  Same  Procedure(s): 1.  Cystoscopy, bilateral retrograde pyelogram with intraoperative interpretation, bladder biopsy with fulguration  Surgeon: Dr. Harold Barban  Anesthesia: General  Complications: None  EBL: Minimal  Specimens: Bladder biopsy  Disposition of specimens: To pathology  Intraoperative findings: Small less than 1 cm slightly raised erythematous lesion in the right posterior floor of the bladder.  Area completely removed utilizing rigid biopsy forceps and base fulgurated.  No other lesions noted.  Bilateral retrogrades were normal  Indication: Patient is a 72 year old female with history of hematuria and was found on cystoscopy to have slightly raised erythematous area in the right posterior bladder floor.  Her distal ureters also were not well-visualized on recent CT scan due spray artifact from artificial hip joint.  She presents at this time for cystoscopy retrogrades bladder biopsy and fulguration.  Description of procedure:  After obtaining informed consent the patient was taken major cystoscopy suite placed under general anesthesia.  Placed in dorsolithotomy position genitalia prepped and draped usual sterile fashion.  Proper pause and timeout was performed.  42 [place] the scope was advanced into the bladder with above-noted findings.  Rigid biopsy forcep was utilized to remove the slightly raised erythematous lesion in the right floor of the bladder.  This area was completely removed and sent to pathology for evaluation.  The Bugbee electrode utilized to fulgurate the base good hemostasis noted.  Attention then directed towards retrograde pyelograms.  Utilizing 5 French catheter right ureteral orifice was identified and cannulated with the open tip catheter.  Retrograde pyelogram was performed revealed normal filling of the right ureter without evidence of filling defect or  hydronephrosis.  Right upper tract emptied out promptly upon removal of the retrograde catheter.  In similar manner left retrograde pyelogram was performed also showed normal upper urinary tract and prompt emptying.  Bladder was emptied the biopsy area reinspected with good hemostasis.  Procedure terminated she was awakened from anesthesia and taken recovery in stable condition.  No immediate complication from the procedure

## 2022-04-11 NOTE — Anesthesia Postprocedure Evaluation (Signed)
Anesthesia Post Note  Patient: Connie Rowe  Procedure(s) Performed: CYSTOSCOPY WITH BIOPSY AND FULGERATION (Bladder) CYSTOSCOPY WITH RETROGRADE PYELOGRAM (Bilateral: Ureter)     Patient location during evaluation: PACU Anesthesia Type: General Level of consciousness: awake and alert Pain management: pain level controlled Vital Signs Assessment: post-procedure vital signs reviewed and stable Respiratory status: spontaneous breathing, nonlabored ventilation and respiratory function stable Cardiovascular status: blood pressure returned to baseline Postop Assessment: no apparent nausea or vomiting Anesthetic complications: no   No notable events documented.  Last Vitals:  Vitals:   04/11/22 0747 04/11/22 0932  BP: (!) 179/84 (!) 164/70  Pulse: 66 80  Resp:  11  Temp:  (!) 36.4 C  SpO2:  93%    Last Pain:  Vitals:   04/11/22 0932  TempSrc:   PainSc: 0-No pain                 Marthenia Rolling

## 2022-04-12 LAB — SURGICAL PATHOLOGY

## 2022-04-13 ENCOUNTER — Encounter (HOSPITAL_BASED_OUTPATIENT_CLINIC_OR_DEPARTMENT_OTHER): Payer: Self-pay | Admitting: Urology

## 2022-04-19 DIAGNOSIS — R310 Gross hematuria: Secondary | ICD-10-CM | POA: Diagnosis not present

## 2022-05-29 ENCOUNTER — Telehealth: Payer: Self-pay | Admitting: Family Medicine

## 2022-05-29 NOTE — Telephone Encounter (Signed)
Contacted Connie Rowe to schedule their annual wellness visit. Appointment made for 05/31/2022.  [facility] Care Guide [facility] TEAM Direct Dial: [phone]

## 2022-05-31 ENCOUNTER — Ambulatory Visit (INDEPENDENT_AMBULATORY_CARE_PROVIDER_SITE_OTHER): Payer: Medicare HMO

## 2022-05-31 VITALS — Ht 63.5 in | Wt 170.0 lb

## 2022-05-31 DIAGNOSIS — Z0000 Encounter for general adult medical examination without abnormal findings: Secondary | ICD-10-CM

## 2022-05-31 NOTE — Patient Instructions (Signed)
Connie Rowe , Thank you for taking time to come for your Medicare Wellness Visit. I appreciate your ongoing commitment to your health goals. Please review the following plan we discussed and let me know if I can assist you in the future.   These are the goals we discussed:  Goals       Increase physical activity      Starting 11/29/2017, I will continue to walk for 15 minutes daily.       Patient Stated      04/30/2020, I will continue to walk 3 days a week for 20-25 minutes       Patient Stated (pt-stated)      Loose weight        This is a list of the screening recommended for you and due dates:  Health Maintenance  Topic Date Due   Zoster (Shingles) Vaccine (1 of 2) Never done   DTaP/Tdap/Td vaccine (3 - Tdap) 10/03/2016   Eye exam for diabetics  12/02/2019   Complete foot exam   03/31/2020   Yearly kidney health urinalysis for diabetes  05/07/2021   COVID-19 Vaccine (3 - 2023-24 season) 09/30/2021   Hemoglobin A1C  04/10/2022   Flu Shot  08/31/2022   Yearly kidney function blood test for diabetes  10/11/2022   Mammogram  12/14/2022   Medicare Annual Wellness Visit  05/31/2023   Colon Cancer Screening  12/06/2025   Pneumonia Vaccine  Completed   DEXA scan (bone density measurement)  Completed   Hepatitis C Screening: USPSTF Recommendation to screen - Ages 21-79 yo.  Completed   HPV Vaccine  Aged Out    Advanced directives: Yes will bring copy  Conditions/risks identified: Aim for 30 minutes of exercise or brisk walking, 6-8 glasses of water, and 5 servings of fruits and vegetables each day.   Next appointment: Follow up in one year for your annual wellness visit 06/04/23 @ 1:00 televisit   Preventive Care 65 Years and Older, Female Preventive care refers to lifestyle choices and visits with your health care provider that can promote health and wellness. What does preventive care include? A yearly physical exam. This is also called an annual well check. Dental exams  once or twice a year. Routine eye exams. Ask your health care provider how often you should have your eyes checked. Personal lifestyle choices, including: Daily care of your teeth and gums. Regular physical activity. Eating a healthy diet. Avoiding tobacco and drug use. Limiting alcohol use. Practicing safe sex. Taking low-dose aspirin every day. Taking vitamin and mineral supplements as recommended by your health care provider. What happens during an annual well check? The services and screenings done by your health care provider during your annual well check will depend on your age, overall health, lifestyle risk factors, and family history of disease. Counseling  Your health care provider may ask you questions about your: Alcohol use. Tobacco use. Drug use. Emotional well-being. Home and relationship well-being. Sexual activity. Eating habits. History of falls. Memory and ability to understand (cognition). Work and work Astronomer. Reproductive health. Screening  You may have the following tests or measurements: Height, weight, and BMI. Blood pressure. Lipid and cholesterol levels. These may be checked every 5 years, or more frequently if you are over 87 years old. Skin check. Lung cancer screening. You may have this screening every year starting at age 53 if you have a 30-pack-year history of smoking and currently smoke or have quit within the past 15 years. Fecal  occult blood test (FOBT) of the stool. You may have this test every year starting at age 74. Flexible sigmoidoscopy or colonoscopy. You may have a sigmoidoscopy every 5 years or a colonoscopy every 10 years starting at age 74. Hepatitis C blood test. Hepatitis B blood test. Sexually transmitted disease (STD) testing. Diabetes screening. This is done by checking your blood sugar (glucose) after you have not eaten for a while (fasting). You may have this done every 1-3 years. Bone density scan. This is done to  screen for osteoporosis. You may have this done starting at age 74. Mammogram. This may be done every 1-2 years. Talk to your health care provider about how often you should have regular mammograms. Talk with your health care provider about your test results, treatment options, and if necessary, the need for more tests. Vaccines  Your health care provider may recommend certain vaccines, such as: Influenza vaccine. This is recommended every year. Tetanus, diphtheria, and acellular pertussis (Tdap, Td) vaccine. You may need a Td booster every 10 years. Zoster vaccine. You may need this after age 74. Pneumococcal 13-valent conjugate (PCV13) vaccine. One dose is recommended after age 74. Pneumococcal polysaccharide (PPSV23) vaccine. One dose is recommended after age 74. Talk to your health care provider about which screenings and vaccines you need and how often you need them. This information is not intended to replace advice given to you by your health care provider. Make sure you discuss any questions you have with your health care provider. Document Released: 02/12/2015 Document Revised: 10/06/2015 Document Reviewed: 11/17/2014 Elsevier Interactive Patient Education  2017 [employer]  Fall Prevention in the Home Falls can cause injuries. They can happen to people of all ages. There are many things you can do to make your home safe and to help prevent falls. What can I do on the outside of my home? Regularly fix the edges of walkways and driveways and fix any cracks. Remove anything that might make you trip as you walk through a door, such as a raised step or threshold. Trim any bushes or trees on the path to your home. Use bright outdoor lighting. Clear any walking paths of anything that might make someone trip, such as rocks or tools. Regularly check to see if handrails are loose or broken. Make sure that both sides of any steps have handrails. Any raised decks and porches should have  guardrails on the edges. Have any leaves, snow, or ice cleared regularly. Use sand or salt on walking paths during winter. Clean up any spills in your garage right away. This includes oil or grease spills. What can I do in the bathroom? Use night lights. Install grab bars by the toilet and in the tub and shower. Do not use towel bars as grab bars. Use non-skid mats or decals in the tub or shower. If you need to sit down in the shower, use a plastic, non-slip stool. Keep the floor dry. Clean up any water that spills on the floor as soon as it happens. Remove soap buildup in the tub or shower regularly. Attach bath mats securely with double-sided non-slip rug tape. Do not have throw rugs and other things on the floor that can make you trip. What can I do in the bedroom? Use night lights. Make sure that you have a light by your bed that is easy to reach. Do not use any sheets or blankets that are too big for your bed. They should not hang down onto the  floor. Have a firm chair that has side arms. You can use this for support while you get dressed. Do not have throw rugs and other things on the floor that can make you trip. What can I do in the kitchen? Clean up any spills right away. Avoid walking on wet floors. Keep items that you use a lot in easy-to-reach places. If you need to reach something above you, use a strong step stool that has a grab bar. Keep electrical cords out of the way. Do not use floor polish or wax that makes floors slippery. If you must use wax, use non-skid floor wax. Do not have throw rugs and other things on the floor that can make you trip. What can I do with my stairs? Do not leave any items on the stairs. Make sure that there are handrails on both sides of the stairs and use them. Fix handrails that are broken or loose. Make sure that handrails are as long as the stairways. Check any carpeting to make sure that it is firmly attached to the stairs. Fix any carpet  that is loose or worn. Avoid having throw rugs at the top or bottom of the stairs. If you do have throw rugs, attach them to the floor with carpet tape. Make sure that you have a light switch at the top of the stairs and the bottom of the stairs. If you do not have them, ask someone to add them for you. What else can I do to help prevent falls? Wear shoes that: Do not have high heels. Have rubber bottoms. Are comfortable and fit you well. Are closed at the toe. Do not wear sandals. If you use a stepladder: Make sure that it is fully opened. Do not climb a closed stepladder. Make sure that both sides of the stepladder are locked into place. Ask someone to hold it for you, if possible. Clearly mark and make sure that you can see: Any grab bars or handrails. First and last steps. Where the edge of each step is. Use tools that help you move around (mobility aids) if they are needed. These include: Canes. Walkers. Scooters. Crutches. Turn on the lights when you go into a dark area. Replace any light bulbs as soon as they burn out. Set up your furniture so you have a clear path. Avoid moving your furniture around. If any of your floors are uneven, fix them. If there are any pets around you, be aware of where they are. Review your medicines with your doctor. Some medicines can make you feel dizzy. This can increase your chance of falling. Ask your doctor what other things that you can do to help prevent falls. This information is not intended to replace advice given to you by your health care provider. Make sure you discuss any questions you have with your health care provider. Document Released: 11/12/2008 Document Revised: 06/24/2015 Document Reviewed: 02/20/2014 Elsevier Interactive Patient Education  2017 [employer].

## 2022-05-31 NOTE — Progress Notes (Signed)
I connected with  Milderd Meager on 05/31/22 by a audio enabled telemedicine application and verified that I am speaking with the correct person using two identifiers.  Patient Location: Home  Provider Location: Office/Clinic  I discussed the limitations of evaluation and management by telemedicine. The patient expressed understanding and agreed to proceed.  Subjective:   AVEA MCGOWEN is a 72 y.o. female who presents for Medicare Annual (Subsequent) preventive examination.  Review of Systems     Cardiac Risk Factors include: advanced age (>83men, >37 women);hypertension     Objective:    Today's Vitals   05/31/22 1533  Weight: 170 lb (77.1 kg)  Height: 5' 3.5" (1.613 m)   Body mass index is 29.64 kg/m.     05/31/2022    3:17 PM 04/11/2022    7:14 AM 09/14/2021    8:08 PM 04/30/2020    9:02 AM 11/29/2017   11:40 AM 01/09/2017    4:30 PM 01/05/2017   10:18 AM  Advanced Directives  Does Patient Have a Medical Advance Directive? Yes Yes No Yes No No No  Type of Estate agent of [place];Living will Healthcare Power of [place];Living will  Healthcare Power of [place];Living will     Does patient want to make changes to medical advance directive?  No - Patient declined   Yes (MAU/Ambulatory/Procedural Areas - Information given)    Copy of Healthcare Power of Attorney in Chart? No - copy requested No - copy requested  No - copy requested     Would patient like information on creating a medical advance directive?   No - Patient declined   No - Patient declined No - Patient declined    Current Medications (verified) Outpatient Encounter Medications as of 05/31/2022  Medication Sig   Calcium Carbonate-Vitamin D (CALCIUM-VITAMIN D) 500-200 MG-UNIT per tablet Take 1 tablet by mouth daily.   diphenhydrAMINE (BENADRYL) 25 mg capsule Take 25 mg by mouth every 6 (six) hours as needed.   FLUoxetine (PROZAC) 40 MG capsule Take 1 capsule (40 mg total) by mouth daily.    fluticasone (FLONASE) 50 MCG/ACT nasal spray Place 2 sprays into both nostrils daily. (Patient taking differently: Place 2 sprays into both nostrils as needed for allergies.)   hydrochlorothiazide (MICROZIDE) 12.5 MG capsule Take 1 capsule (12.5 mg total) by mouth daily.   Multiple Vitamins-Minerals (MULTIVITAMIN PO) Take 1 tablet by mouth daily.   pantoprazole (PROTONIX) 40 MG tablet Take 1 tablet (40 mg total) by mouth daily.   pravastatin (PRAVACHOL) 80 MG tablet Take 1 tablet (80 mg total) by mouth daily.   traZODone (DESYREL) 50 MG tablet TAKE 1/2 TO 1 TABLET AT BEDTIME AS NEEDED FOR SLEEP   famotidine (PEPCID) 40 MG tablet Take 1 tablet (40 mg total) by mouth at bedtime. (Patient not taking: Reported on 04/11/2022)   Fluticasone Furoate (ARNUITY ELLIPTA) 100 MCG/ACT AEPB Inhale 1 puff into the lungs daily. (Patient not taking: Reported on 05/31/2022)   meloxicam (MOBIC) 15 MG tablet  (Patient not taking: Reported on 04/11/2022)   sucralfate (CARAFATE) 1 g tablet Take 1 tablet (1 g total) by mouth 4 (four) times daily -  with meals and at bedtime. (Patient not taking: Reported on 04/11/2022)   sulfamethoxazole-trimethoprim (BACTRIM DS) 800-160 MG tablet Take 1 tablet by mouth 2 (two) times daily. (Patient not taking: Reported on 04/11/2022)   No facility-administered encounter medications on file as of 05/31/2022.    Allergies (verified) Ace inhibitors and Ambien [zolpidem tartrate]  History: Past Medical History:  Diagnosis Date   Allergy    Anxiety    Arthritis    Colon polyps    Depression    Esophageal stricture    Falls    Pt denies   GERD (gastroesophageal reflux disease)    H/O fracture of humerus 01/31/2004   History of bronchitis    History of hiatal hernia    Hyperlipidemia    Hypertension    Nocturia    Osteopenia    Pancreatitis    History of   Sleep apnea    uses CPAP   Wears glasses    reading glasses  04/06/2022   Past Surgical History:  Procedure  Laterality Date   BLADDER REPAIR     COLONOSCOPY     CYSTOSCOPY W/ RETROGRADES Bilateral 04/11/2022   Procedure: CYSTOSCOPY WITH RETROGRADE PYELOGRAM;  Surgeon: Belva Agee, MD;  Location: [facility];  Service: Urology;  Laterality: Bilateral;   CYSTOSCOPY WITH BIOPSY N/A 04/11/2022   Procedure: CYSTOSCOPY WITH BIOPSY AND FULGERATION;  Surgeon: Belva Agee, MD;  Location[facility];  Service: Urology;  Laterality: N/A;   DIAGNOSTIC LAPAROSCOPY     tubal ligation   DILATION AND CURETTAGE OF UTERUS     ESOPHAGEAL DILATION     LUMBAR LAMINECTOMY/DECOMPRESSION MICRODISCECTOMY Right 09/23/2014   Procedure: Laminectomy and Foraminotomy - Lumbar three- lumbar four - right ;  Surgeon: Tia Alert, MD;  Location: [facility];  Service: Neurosurgery;  Laterality: Right;   MULTIPLE TOOTH EXTRACTIONS     TONSILLECTOMY     TOTAL HIP ARTHROPLASTY Right 03/30/2015   Procedure: RIGHT TOTAL HIP ARTHROPLASTY ANTERIOR APPROACH;  Surgeon: Durene Romans, MD;  Location: [facility];  Service: Orthopedics;  Laterality: Right;   TOTAL HIP ARTHROPLASTY Left 01/09/2017   Procedure: LEFT TOTAL HIP ARTHROPLASTY ANTERIOR APPROACH;  Surgeon: Durene Romans, MD;  Location: [facility];  Service: Orthopedics;  Laterality: Left;  70 mins   TUBAL LIGATION     UPPER GASTROINTESTINAL ENDOSCOPY     UPPER GI ENDOSCOPY     Family History  Problem Relation Age of Onset   Stroke Mother    Hypertension Mother    Colon cancer Mother    Stroke Brother    Drug abuse Son    Colon polyps Neg Hx    Crohn's disease Neg Hx    Pancreatic cancer Neg Hx    Rectal cancer Neg Hx    Stomach cancer Neg Hx    Ulcerative colitis Neg Hx    Breast cancer Neg Hx    Esophageal cancer Neg Hx    Social History   Socioeconomic History   Marital status: Married    Spouse name: Not on file   Number of children: Not on file   Years of education: Not on file   Highest education level: Not on file   Occupational History   Not on file  Tobacco Use   Smoking status: Never   Smokeless tobacco: Never  Vaping Use   Vaping Use: Never used  Substance and Sexual Activity   Alcohol use: Yes    Comment: very rarely   Drug use: No   Sexual activity: Yes  Other Topics Concern   Not on file  Social History Narrative   Not on file   Social Determinants of Health   Financial Resource Strain: Low Risk  (05/31/2022)   Overall Financial Resource Strain (CARDIA)    Difficulty of Paying Living  Expenses: Not hard at all  Food Insecurity: No Food Insecurity (05/31/2022)   Hunger Vital Sign    Worried About Running Out of Food in the Last Year: Never true    Ran Out of Food in the Last Year: Never true  Transportation Needs: No Transportation Needs (05/31/2022)   PRAPARE - Administrator, Civil Service (Medical): No    Lack of Transportation (Non-Medical): No  Physical Activity: Insufficiently Active (05/31/2022)   Exercise Vital Sign    Days of Exercise per Week: 3 days    Minutes of Exercise per Session: 40 min  Stress: No Stress Concern Present (05/31/2022)   [employer] of Occupational Health - Occupational Stress Questionnaire    Feeling of Stress : Only a little  Social Connections: Moderately Integrated (05/31/2022)   Social Connection and Isolation Panel [NHANES]    Frequency of Communication with Friends and Family: Three times a week    Frequency of Social Gatherings with Friends and Family: Three times a week    Attends Religious Services: 1 to 4 times per year    Active Member of Clubs or Organizations: No    Attends Engineer, structural: Never    Marital Status: Married    Tobacco Counseling Counseling given: Not Answered   Clinical Intake:  Pre-visit preparation completed: Yes  Pain : No/denies pain     Nutritional Risks: None Diabetes: No  How often do you need to have someone help you when you read instructions, pamphlets, or other written  materials from your doctor or pharmacy?: 1 - Never  Diabetic?no  Interpreter Needed?: No  Comments: married Information entered by :: p. Foy CMA   Activities of Daily Living    05/31/2022   10:51 AM 04/11/2022    7:23 AM  In your present state of health, do you have any difficulty performing the following activities:  Hearing? 0 0  Vision? 0 0  Difficulty concentrating or making decisions? 0 0  Walking or climbing stairs? 0 0  Dressing or bathing? 0 0  Doing errands, shopping? 0   Preparing Food and eating ? N   Using the Toilet? N   In the past six months, have you accidently leaked urine? Y   Comment seen urology,due to sneezing or coughing   Do you have problems with loss of bowel control? N   Managing your Medications? N   Managing your Finances? N   Housekeeping or managing your Housekeeping? N     Patient Care Team: [place], Audrie Gallus, MD as PCP - General (Family Medicine) Phil Dopp, [facility] as Pharmacist (Pharmacist)  Indicate any recent Medical Services you may have received from other than [facility] providers in the past year (date may be approximate).     Assessment:   This is a routine wellness examination for [facility].  Hearing/Vision screen Hearing Screening - Comments:: Adequate hearing Vision Screening - Comments:: Readers- [facility]  Lois Huxley issues and exercise activities discussed: Current Exercise Habits: Structured exercise class, Type of exercise: walking, Time (Minutes): 45, Frequency (Times/Week): 4, Weekly Exercise (Minutes/Week): 180, Intensity: Mild   Goals Addressed               This Visit's Progress     Patient Stated (pt-stated)        Loose weight       Depression Screen    05/31/2022    3:15 PM 04/30/2020    9:14 AM 04/01/2019    9:49 AM  11/29/2017   11:41 AM 11/15/2016    9:22 AM 10/15/2015    8:59 AM 06/03/2014    5:52 PM  PHQ 2/9 Scores  PHQ - 2 Score 0 0 0 1 0 0 0  PHQ- 9 Score  0 5 1 0      Fall Risk    05/31/2022   10:51  AM 04/30/2020    9:14 AM 04/01/2019    8:57 AM 11/29/2017   11:41 AM 11/15/2016    9:22 AM  Fall Risk   Falls in the past year? 1 0 0 No No  Number falls in past yr: 0 0     Injury with Fall? 1 0     Comment tripped on stairs and broke toe      Risk for fall due to :  Medication side effect     Follow up Falls evaluation completed;Education provided;Falls prevention discussed Falls evaluation completed;Falls prevention discussed Falls evaluation completed      FALL RISK PREVENTION PERTAINING TO THE HOME:  Any stairs in or around the home? Yes  If so, are there any without handrails? Yes  Home free of loose throw rugs in walkways, pet beds, electrical cords, etc? No  Adequate lighting in your home to reduce risk of falls? Yes   ASSISTIVE DEVICES UTILIZED TO PREVENT FALLS:  Life alert? No  Use of a cane, walker or w/c? No  Grab bars in the bathroom? No  Shower chair or bench in shower? Yes  Elevated toilet seat or a handicapped toilet? No    Cognitive Function:    04/30/2020    9:15 AM 11/29/2017   11:41 AM 11/15/2016    9:44 AM 10/15/2015    8:53 AM  MMSE - Mini Mental State Exam  Orientation to time 5 5 5 5   Orientation to Place 5 5 5 5   Registration 3 3 3 3   Attention/ Calculation 5 0 0 0  Recall 3 3 3 3   Language- name 2 objects  0 0 0  Language- repeat 1 1 1 1   Language- follow 3 step command  3 3 3   Language- read & follow direction  0 0 0  Write a sentence  0 0 0  Copy design  0 0 0  Total score  20 20 20         05/31/2022    3:25 PM  6CIT Screen  What Year? 0 points  What month? 0 points  What time? 0 points  Count back from 20 0 points  Months in reverse 0 points  Repeat phrase 0 points  Total Score 0 points    Immunizations Immunization History  Administered Date(s) Administered   Fluad Quad(high Dose 65+) 12/31/2019, 01/04/2021, 10/10/2021   Influenza Split 11/17/2010, 12/14/2011   Influenza Whole 12/07/2006, 11/05/2007, 11/10/2008, 01/05/2010    Influenza,inj,Quad PF,6+ Mos 12/17/2012, 12/16/2013, 12/31/2014, 10/15/2015, 11/15/2016, 11/29/2017, 09/24/2018   PFIZER(Purple Top)SARS-COV-2 Vaccination 02/18/2019, 03/11/2019   Pneumococcal Conjugate-13 06/03/2014   Pneumococcal Polysaccharide-23 10/15/2015   Td 10/01/1995, 10/04/2006   Zoster, Live 09/29/2013    TDAP status: Due, Education has been provided regarding the importance of this vaccine. Advised may receive this vaccine at local pharmacy or Health Dept. Aware to provide a copy of the vaccination record if obtained from local pharmacy or Health Dept. Verbalized acceptance and understanding.  Flu Vaccine status: Up to date  Pneumococcal vaccine status: Due, Education has been provided regarding the importance of this vaccine. Advised may receive this vaccine  at local pharmacy or Health Dept. Aware to provide a copy of the vaccination record if obtained from local pharmacy or Health Dept. Verbalized acceptance and understanding.  Covid-19 vaccine status: Declined, Education has been provided regarding the importance of this vaccine but patient still declined. Advised may receive this vaccine at local pharmacy or Health Dept.or vaccine clinic. Aware to provide a copy of the vaccination record if obtained from local pharmacy or Health Dept. Verbalized acceptance and understanding.  Qualifies for Shingles Vaccine? Yes   Zostavax completed No   Shingrix Completed?: No.    Education has been provided regarding the importance of this vaccine. Patient has been advised to call insurance company to determine out of pocket expense if they have not yet received this vaccine. Advised may also receive vaccine at local pharmacy or Health Dept. Verbalized acceptance and understanding.  Screening Tests Health Maintenance  Topic Date Due   Zoster Vaccines- Shingrix (1 of 2) Never done   DTaP/Tdap/Td (3 - Tdap) 10/03/2016   OPHTHALMOLOGY EXAM  12/02/2019   FOOT EXAM  03/31/2020   Diabetic kidney  evaluation - Urine ACR  05/07/2021   COVID-19 Vaccine (3 - 2023-24 season) 09/30/2021   HEMOGLOBIN A1C  04/10/2022   INFLUENZA VACCINE  08/31/2022   Diabetic kidney evaluation - eGFR measurement  10/11/2022   MAMMOGRAM  12/14/2022   Medicare Annual Wellness (AWV)  05/31/2023   COLONOSCOPY (Pts 45-101yrs Insurance coverage will need to be confirmed)  12/06/2025   Pneumonia Vaccine 66+ Years old  Completed   DEXA SCAN  Completed   Hepatitis C Screening  Completed   HPV VACCINES  Aged Out    Health Maintenance  Health Maintenance Due  Topic Date Due   Zoster Vaccines- Shingrix (1 of 2) Never done   DTaP/Tdap/Td (3 - Tdap) 10/03/2016   OPHTHALMOLOGY EXAM  12/02/2019   FOOT EXAM  03/31/2020   Diabetic kidney evaluation - Urine ACR  05/07/2021   COVID-19 Vaccine (3 - 2023-24 season) 09/30/2021   HEMOGLOBIN A1C  04/10/2022    Colorectal cancer screening: Type of screening: Colonoscopy. Completed 12/06/20. Repeat every 0 years  Mammogram status: Completed yes 12/13/21. Repeat every year  Bone Density status: Completed 05/16/21. Results reflect: Bone density results: NORMAL. Repeat every 2 years.  Lung Cancer Screening: (Low Dose CT Chest recommended if Age 88-80 years, 30 pack-year currently smoking OR have quit w/in 15years.) does not qualify.   Lung Cancer Screening Referral: no  Additional Screening:  Hepatitis C Screening: does qualify; Completed 9/115/17  Vision Screening: Recommended annual ophthalmology exams for early detection of glaucoma and other disorders of the eye. Is the patient up to date with their annual eye exam?  Yes  Who is the provider or what is the name of the office in which the patient attends annual eye exams? [facility] If pt is not established with a provider, would they like to be referred to a provider to establish care? No .   Dental Screening: Recommended annual dental exams for proper oral hygiene  Community Resource Referral / Chronic Care  Management: CRR required this visit?  No   CCM required this visit?  No      Plan:     I have personally reviewed and noted the following in the patient's chart:   Medical and social history Use of alcohol, tobacco or illicit drugs  Current medications and supplements including opioid prescriptions. Patient is not currently taking opioid prescriptions. Functional ability and status Nutritional  status Physical activity Advanced directives List of other physicians Hospitalizations, surgeries, and ER visits in previous 12 months Vitals Screenings to include cognitive, depression, and falls Referrals and appointments  In addition, I have reviewed and discussed with patient certain preventive protocols, quality metrics, and best practice recommendations. A written personalized care plan for preventive services as well as general preventive health recommendations were provided to patient.     Maryan Puls, LPN   07/08/6293   Nurse Notes: None

## 2022-07-20 DIAGNOSIS — Z96643 Presence of artificial hip joint, bilateral: Secondary | ICD-10-CM | POA: Diagnosis not present

## 2022-08-24 ENCOUNTER — Ambulatory Visit (INDEPENDENT_AMBULATORY_CARE_PROVIDER_SITE_OTHER): Payer: Medicare HMO | Admitting: Family Medicine

## 2022-08-24 ENCOUNTER — Ambulatory Visit (INDEPENDENT_AMBULATORY_CARE_PROVIDER_SITE_OTHER)
Admission: RE | Admit: 2022-08-24 | Discharge: 2022-08-24 | Disposition: A | Discharge status: Home / Self Care | Payer: Medicare HMO | Source: Ambulatory Visit | Attending: Family Medicine | Admitting: Family Medicine

## 2022-08-24 ENCOUNTER — Encounter: Payer: Self-pay | Admitting: Family Medicine

## 2022-08-24 VITALS — BP 137/80 | HR 74 | Temp 97.6°F | Ht 63.5 in | Wt 168.2 lb

## 2022-08-24 DIAGNOSIS — M25541 Pain in joints of right hand: Secondary | ICD-10-CM

## 2022-08-24 DIAGNOSIS — M67449 Ganglion, unspecified hand: Secondary | ICD-10-CM | POA: Diagnosis not present

## 2022-08-24 DIAGNOSIS — L821 Other seborrheic keratosis: Secondary | ICD-10-CM

## 2022-08-24 DIAGNOSIS — M2550 Pain in unspecified joint: Secondary | ICD-10-CM | POA: Diagnosis not present

## 2022-08-24 DIAGNOSIS — M7989 Other specified soft tissue disorders: Secondary | ICD-10-CM | POA: Diagnosis not present

## 2022-08-24 LAB — CBC WITH DIFFERENTIAL/PLATELET
Basophils Absolute: 0 10*3/uL (ref 0.0–0.1)
Basophils Relative: 0.6 % (ref 0.0–3.0)
Eosinophils Absolute: 0.2 10*3/uL (ref 0.0–0.7)
Eosinophils Relative: 2.9 % (ref 0.0–5.0)
HCT: 40.4 % (ref 36.0–46.0)
Hemoglobin: 12.7 g/dL (ref 12.0–15.0)
Lymphocytes Relative: 39.9 % (ref 12.0–46.0)
Lymphs Abs: 2.3 10*3/uL (ref 0.7–4.0)
MCHC: 31.6 g/dL (ref 30.0–36.0)
MCV: 94.4 fl (ref 78.0–100.0)
Monocytes Absolute: 0.6 10*3/uL (ref 0.1–1.0)
Monocytes Relative: 10.1 % (ref 3.0–12.0)
Neutro Abs: 2.6 10*3/uL (ref 1.4–7.7)
Neutrophils Relative %: 46.5 % (ref 43.0–77.0)
Platelets: 250 10*3/uL (ref 150.0–400.0)
RBC: 4.28 Mil/uL (ref 3.87–5.11)
RDW: 14.6 % (ref 11.5–15.5)
WBC: 5.7 10*3/uL (ref 4.0–10.5)

## 2022-08-24 LAB — SEDIMENTATION RATE: Sed Rate: 29 mm/hr (ref 0–30)

## 2022-08-24 MED ORDER — HYDROCHLOROTHIAZIDE 12.5 MG PO CAPS: 12.5000 mg | ORAL_CAPSULE | Freq: Every day | ORAL | 1 refills | Status: DC

## 2022-08-24 MED ORDER — PRAVASTATIN SODIUM 80 MG PO TABS: 80.0000 mg | ORAL_TABLET | Freq: Every day | ORAL | 1 refills | Status: DC

## 2022-08-24 MED ORDER — PANTOPRAZOLE SODIUM 40 MG PO TBEC: 40.0000 mg | DELAYED_RELEASE_TABLET | Freq: Every day | ORAL | 1 refills | Status: DC

## 2022-08-24 MED ORDER — FLUTICASONE PROPIONATE 50 MCG/ACT NA SUSP: 2.0000 | Freq: Every day | NASAL | 1 refills | Status: DC

## 2022-08-24 MED ORDER — FLUOXETINE HCL 40 MG PO CAPS: 40.0000 mg | ORAL_CAPSULE | Freq: Every day | ORAL | 1 refills | Status: DC

## 2022-08-24 MED ORDER — TRAZODONE HCL 50 MG PO TABS: ORAL_TABLET | ORAL | 1 refills | Status: DC

## 2022-08-24 NOTE — Assessment & Plan Note (Signed)
Somewhat bothersome Xray pending Consider hand referral   Right thumb, distal pip

## 2022-08-24 NOTE — Patient Instructions (Addendum)
Xray of hand today  We will contact you with result   Continue tylenol  I prefer voltaren gel to ibuprofen  Use warm water to get hands moving in the am   Lab today for auto immune joint disease   Take care of yourself

## 2022-08-24 NOTE — Assessment & Plan Note (Signed)
Mostly MCP joints in right hand (is r handed)  Some tenderness A cyst on right distal first PIP as well   Auto immune joint labs ordered today  Xray ordered right hand-pend rad revi Recommend tylenol Keep warm Voltaren gel   Plan to follow

## 2022-08-24 NOTE — Assessment & Plan Note (Signed)
Right shoulder Diffusely on back  Plans to follow up with dermatology (wants to make own appt)  Bothersome  Reassured -does not usually turn into skin cancer   Discussed importance of sun protection

## 2022-08-24 NOTE — Progress Notes (Signed)
Subjective:    Patient ID: Connie Rowe, female    DOB: May 29, 1950, 72 y.o.   MRN: [id number]  HPI  Wt Readings from Last 3 Encounters:  08/24/22 168 lb 4 oz (76.3 kg)  05/31/22 170 lb (77.1 kg)  04/11/22 171 lb (77.6 kg)   29.34 kg/m  Vitals:   08/24/22 0954 08/24/22 1019  BP: (!) 140/78 137/80  Pulse: 74   Temp: 97.6 F (36.4 C)   SpO2: 96%    Pt presents for mole on right shoulder  Also arthritis in hands  Also needs med refills /due for annual    Right hand hurts more lately  Radiates to her elbow Takes tylenol and ibuprofen - does not help a lot   Knot on thumb really hurts  More pain in her MCP joints - swollen and stiff in am  Also red  May be warm to the touch  Sometimes wrist bothers her and pops a lot  No n/t No weakness- limited by pain / esp opening jars   More trouble with knees lately  Has not had xrays   Is right handed    No auto immune dz in family   Spot on shoulder Has a mole  Itches a lot  Has been there for years  Has had a pre cancer on other shoulder in the past      Had total hip replacement in past  Also sciatica  Right side is really bothering her and may be from her back    Patient Active Problem List   Diagnosis Date Noted   Pain in joints of right hand 08/24/2022   Digital mucinous cyst of finger 08/24/2022   Seborrheic keratoses 08/24/2022   Dysuria 12/16/2021   Screening mammogram for breast cancer 10/10/2021   Family hx of colon cancer 05/07/2020   Cervical disc disease 08/19/2019   Right hand paresthesia 08/18/2019   Right arm pain 08/18/2019   Right shoulder pain 08/18/2019   Atypical chest pain 10/14/2018   Dysphagia 10/14/2018   Allergic rhinitis 09/03/2017   Insomnia 11/27/2016   Daytime somnolence 05/23/2016   Snoring 05/23/2016   Estrogen deficiency 10/19/2015   Need for hepatitis C screening test 09/27/2015   S/P right THA, AA 03/30/2015   Pre-operative examination 02/24/2015   S/P  lumbar laminectomy 09/23/2014   Essential hypertension 06/26/2014   Encounter for Medicare annual wellness exam 06/03/2014   Colon cancer screening 06/03/2014   Sciatica 11/26/2013   Prediabetes 04/17/2013   Hirsutism 12/06/2010   Routine general medical examination at a health care facility 09/10/2010   Other screening mammogram 09/01/2010   Osteopenia 10/22/2006   URINARY INCONTINENCE, STRESS 08/22/2006   History of Helicobacter pylori infection 04/18/2006   Hyperlipidemia associated with type 2 diabetes mellitus (HCC) 04/18/2006   Depression with anxiety 04/18/2006   EXTERNAL HEMORRHOIDS 04/18/2006   GERD 04/18/2006   HIATAL HERNIA 04/18/2006   OVERACTIVE BLADDER 04/18/2006   Hematuria 04/18/2006   PANCREATITIS, HX OF 04/18/2006   DIVERTICULOSIS, COLON 01/02/2000   Past Medical History:  Diagnosis Date   Allergy    Anxiety    Arthritis    Colon polyps    Depression    Esophageal stricture    Falls    Pt denies   GERD (gastroesophageal reflux disease)    H/O fracture of humerus 01/31/2004   History of bronchitis    History of hiatal hernia    Hyperlipidemia    Hypertension    Nocturia  Osteopenia    Pancreatitis    History of   Sleep apnea    uses CPAP   Wears glasses    reading glasses  04/06/2022   Past Surgical History:  Procedure Laterality Date   BLADDER REPAIR     COLONOSCOPY     CYSTOSCOPY W/ RETROGRADES Bilateral 04/11/2022   Procedure: CYSTOSCOPY WITH RETROGRADE PYELOGRAM;  Surgeon: Belva Agee, MD;  Location: [facility];  Service: Urology;  Laterality: Bilateral;   CYSTOSCOPY WITH BIOPSY N/A 04/11/2022   Procedure: CYSTOSCOPY WITH BIOPSY AND FULGERATION;  Surgeon: Belva Agee, MD;  Location: [facility];  Service: Urology;  Laterality: N/A;   DIAGNOSTIC LAPAROSCOPY     tubal ligation   DILATION AND CURETTAGE OF UTERUS     ESOPHAGEAL DILATION     LUMBAR LAMINECTOMY/DECOMPRESSION MICRODISCECTOMY Right  09/23/2014   Procedure: Laminectomy and Foraminotomy - Lumbar three- lumbar four - right ;  Surgeon: Tia Alert, MD;  Location: [facility];  Service: Neurosurgery;  Laterality: Right;   MULTIPLE TOOTH EXTRACTIONS     TONSILLECTOMY     TOTAL HIP ARTHROPLASTY Right 03/30/2015   Procedure: RIGHT TOTAL HIP ARTHROPLASTY ANTERIOR APPROACH;  Surgeon: Durene Romans, MD;  Location: [facility];  Service: Orthopedics;  Laterality: Right;   TOTAL HIP ARTHROPLASTY Left 01/09/2017   Procedure: LEFT TOTAL HIP ARTHROPLASTY ANTERIOR APPROACH;  Surgeon: Durene Romans, MD;  Location: [facility];  Service: Orthopedics;  Laterality: Left;  70 mins   TUBAL LIGATION     UPPER GASTROINTESTINAL ENDOSCOPY     UPPER GI ENDOSCOPY     Social History   Tobacco Use   Smoking status: Never   Smokeless tobacco: Never  Vaping Use   Vaping status: Never Used  Substance Use Topics   Alcohol use: Yes    Comment: very rarely   Drug use: No   Family History  Problem Relation Age of Onset   Stroke Mother    Hypertension Mother    Colon cancer Mother    Stroke Brother    Drug abuse Son    Colon polyps Neg Hx    Crohn's disease Neg Hx    Pancreatic cancer Neg Hx    Rectal cancer Neg Hx    Stomach cancer Neg Hx    Ulcerative colitis Neg Hx    Breast cancer Neg Hx    Esophageal cancer Neg Hx    Allergies  Allergen Reactions   Ace Inhibitors Cough   Ambien [Zolpidem Tartrate] Other (See Comments)    Affected memory   Current Outpatient Medications on File Prior to Visit  Medication Sig Dispense Refill   Calcium Carbonate-Vitamin D (CALCIUM-VITAMIN D) 500-200 MG-UNIT per tablet Take 1 tablet by mouth daily.     diphenhydrAMINE (BENADRYL) 25 mg capsule Take 25 mg by mouth every 6 (six) hours as needed.     Multiple Vitamins-Minerals (MULTIVITAMIN PO) Take 1 tablet by mouth daily.     No current facility-administered medications on file prior to visit.    Review of Systems  Constitutional:  Negative for activity  change, appetite change, fatigue, fever and unexpected weight change.  HENT:  Negative for congestion, ear pain, rhinorrhea, sinus pressure and sore throat.   Eyes:  Negative for pain, redness and visual disturbance.  Respiratory:  Negative for cough, shortness of breath and wheezing.   Cardiovascular:  Negative for chest pain and palpitations.  Gastrointestinal:  Negative for abdominal pain, blood in stool, constipation and diarrhea.  Endocrine: Negative for polydipsia and polyuria.  Genitourinary:  Negative for dysuria, frequency and urgency.  Musculoskeletal:  Positive for arthralgias and joint swelling. Negative for back pain and myalgias.  Skin:  Negative for pallor and rash.  Allergic/Immunologic: Negative for environmental allergies.  Neurological:  Negative for dizziness, syncope and headaches.  Hematological:  Negative for adenopathy. Does not bruise/bleed easily.  Psychiatric/Behavioral:  Negative for decreased concentration and dysphoric mood. The patient is not nervous/anxious.        Objective:   Physical Exam Constitutional:      General: She is not in acute distress.    Appearance: Normal appearance. She is normal weight. She is not ill-appearing.  HENT:     Head: Normocephalic and atraumatic.     Mouth/Throat:     Mouth: Mucous membranes are moist.  Eyes:     General:        Right eye: No discharge.        Left eye: No discharge.     Conjunctiva/sclera: Conjunctivae normal.     Pupils: Pupils are equal, round, and reactive to light.  Cardiovascular:     Rate and Rhythm: Normal rate and regular rhythm.  Musculoskeletal:     Cervical back: Neck supple.     Comments: Swollen/tender right MCP joints  No ulnar deviation  Few herberden's nodes in distal pips Small cyst palpated on right first distal pip joint   No erythema  No warmth  No crepitus No triggering   Lymphadenopathy:     Cervical: No cervical adenopathy.  Skin:    General: Skin is warm and dry.      Findings: No erythema or rash.     Comments: Sks of different sizes on back and uEs  Right shoulder 1 cm raised brown sk  Solar lentigines diffusely   Neurological:     Mental Status: She is alert.     Cranial Nerves: No cranial nerve deficit.     Sensory: No sensory deficit.  Psychiatric:        Mood and Affect: Mood normal.           Assessment & Plan:   Problem List Items Addressed This Visit       Musculoskeletal and Integument   Seborrheic keratoses    Right shoulder Diffusely on back  Plans to follow up with dermatology (wants to make own appt)  Bothersome  Reassured -does not usually turn into skin cancer   Discussed importance of sun protection         Other   Pain in joints of right hand - Primary    Mostly MCP joints in right hand (is r handed)  Some tenderness A cyst on right distal first PIP as well   Auto immune joint labs ordered today  Xray ordered right hand-pend rad revi Recommend tylenol Keep warm Voltaren gel   Plan to follow       Relevant Orders   DG Hand Complete Right   Sedimentation Rate   Rheumatoid factor   ANA w/Reflex   CBC with Differential   Digital mucinous cyst of finger    Somewhat bothersome Xray pending Consider hand referral   Right thumb, distal pip

## 2022-08-30 ENCOUNTER — Encounter (INDEPENDENT_AMBULATORY_CARE_PROVIDER_SITE_OTHER): Payer: Self-pay

## 2022-09-02 NOTE — Addendum Note (Signed)
Addended by: Roxy Manns A on: 09/02/2022 05:51 PM   Modules accepted: Orders

## 2022-09-05 ENCOUNTER — Encounter: Payer: Self-pay | Admitting: *Deleted

## 2022-09-18 ENCOUNTER — Telehealth: Payer: Self-pay | Admitting: Family Medicine

## 2022-09-18 DIAGNOSIS — R7303 Prediabetes: Secondary | ICD-10-CM

## 2022-09-18 DIAGNOSIS — E785 Hyperlipidemia, unspecified: Secondary | ICD-10-CM

## 2022-09-18 DIAGNOSIS — I10 Essential (primary) hypertension: Secondary | ICD-10-CM

## 2022-09-18 DIAGNOSIS — Z79899 Other long term (current) drug therapy: Secondary | ICD-10-CM | POA: Insufficient documentation

## 2022-09-18 NOTE — Telephone Encounter (Signed)
-----   Message from Alvina Chou sent at 09/04/2022 10:44 AM EDT ----- Regarding: lab orders for Tuesday, 8.20.24 Patient is scheduled for CPX labs, please order future labs, Thanks , Camelia Eng

## 2022-09-19 ENCOUNTER — Other Ambulatory Visit (INDEPENDENT_AMBULATORY_CARE_PROVIDER_SITE_OTHER): Payer: Medicare HMO

## 2022-09-19 DIAGNOSIS — E785 Hyperlipidemia, unspecified: Secondary | ICD-10-CM | POA: Diagnosis not present

## 2022-09-19 DIAGNOSIS — E1169 Type 2 diabetes mellitus with other specified complication: Secondary | ICD-10-CM | POA: Diagnosis not present

## 2022-09-19 DIAGNOSIS — I10 Essential (primary) hypertension: Secondary | ICD-10-CM

## 2022-09-19 DIAGNOSIS — R7303 Prediabetes: Secondary | ICD-10-CM | POA: Diagnosis not present

## 2022-09-19 DIAGNOSIS — Z79899 Other long term (current) drug therapy: Secondary | ICD-10-CM

## 2022-09-19 LAB — LIPID PANEL
Cholesterol: 229 mg/dL — ABNORMAL HIGH (ref 0–200)
HDL: 67.5 mg/dL (ref >39.00)
LDL Cholesterol: 125 mg/dL — ABNORMAL HIGH (ref 0–99)
NonHDL: 161.71
Total CHOL/HDL Ratio: 3
Triglycerides: 182 mg/dL — ABNORMAL HIGH (ref 0.0–149.0)
VLDL: 36.4 mg/dL (ref 0.0–40.0)

## 2022-09-19 LAB — COMPREHENSIVE METABOLIC PANEL
ALT: 14 U/L (ref 0–35)
AST: 16 U/L (ref 0–37)
Albumin: 4.1 g/dL (ref 3.5–5.2)
Alkaline Phosphatase: 56 U/L (ref 39–117)
BUN: 20 mg/dL (ref 6–23)
CO2: 31 mEq/L (ref 19–32)
Calcium: 9.1 mg/dL (ref 8.4–10.5)
Chloride: 100 mEq/L (ref 96–112)
Creatinine, Ser: 1.47 mg/dL — ABNORMAL HIGH (ref 0.40–1.20)
GFR: 35.04 mL/min — ABNORMAL LOW (ref >60.00)
Glucose, Bld: 121 mg/dL — ABNORMAL HIGH (ref 70–99)
Potassium: 3.8 mEq/L (ref 3.5–5.1)
Sodium: 140 mEq/L (ref 135–145)
Total Bilirubin: 0.5 mg/dL (ref 0.2–1.2)
Total Protein: 6.7 g/dL (ref 6.0–8.3)

## 2022-09-19 LAB — CBC WITH DIFFERENTIAL/PLATELET
Basophils Absolute: 0 10*3/uL (ref 0.0–0.1)
Basophils Relative: 0.6 % (ref 0.0–3.0)
Eosinophils Absolute: 0.2 10*3/uL (ref 0.0–0.7)
Eosinophils Relative: 3.3 % (ref 0.0–5.0)
HCT: 39.8 % (ref 36.0–46.0)
Hemoglobin: 12.8 g/dL (ref 12.0–15.0)
Lymphocytes Relative: 33.9 % (ref 12.0–46.0)
Lymphs Abs: 1.7 10*3/uL (ref 0.7–4.0)
MCHC: 32.2 g/dL (ref 30.0–36.0)
MCV: 93.4 fl (ref 78.0–100.0)
Monocytes Absolute: 0.4 10*3/uL (ref 0.1–1.0)
Monocytes Relative: 7.9 % (ref 3.0–12.0)
Neutro Abs: 2.8 10*3/uL (ref 1.4–7.7)
Neutrophils Relative %: 54.3 % (ref 43.0–77.0)
Platelets: 234 10*3/uL (ref 150.0–400.0)
RBC: 4.26 Mil/uL (ref 3.87–5.11)
RDW: 14.5 % (ref 11.5–15.5)
WBC: 5.1 10*3/uL (ref 4.0–10.5)

## 2022-09-19 LAB — HEMOGLOBIN A1C: Hgb A1c MFr Bld: 6.2 % (ref 4.6–6.5)

## 2022-09-19 LAB — VITAMIN B12: Vitamin B-12: 395 pg/mL (ref 211–911)

## 2022-09-19 LAB — VITAMIN D 25 HYDROXY (VIT D DEFICIENCY, FRACTURES): VITD: 55.06 ng/mL (ref 30.00–100.00)

## 2022-09-19 LAB — TSH: TSH: 0.7 u[IU]/mL (ref 0.35–5.50)

## 2022-09-26 ENCOUNTER — Ambulatory Visit (INDEPENDENT_AMBULATORY_CARE_PROVIDER_SITE_OTHER): Payer: Medicare HMO | Admitting: Family Medicine

## 2022-09-26 ENCOUNTER — Encounter: Payer: Self-pay | Admitting: Family Medicine

## 2022-09-26 VITALS — BP 128/76 | HR 75 | Temp 97.6°F | Ht 63.75 in | Wt 168.5 lb

## 2022-09-26 DIAGNOSIS — F418 Other specified anxiety disorders: Secondary | ICD-10-CM | POA: Diagnosis not present

## 2022-09-26 DIAGNOSIS — E785 Hyperlipidemia, unspecified: Secondary | ICD-10-CM

## 2022-09-26 DIAGNOSIS — E1169 Type 2 diabetes mellitus with other specified complication: Secondary | ICD-10-CM | POA: Diagnosis not present

## 2022-09-26 DIAGNOSIS — N184 Chronic kidney disease, stage 4 (severe): Secondary | ICD-10-CM | POA: Insufficient documentation

## 2022-09-26 DIAGNOSIS — Z23 Encounter for immunization: Secondary | ICD-10-CM

## 2022-09-26 DIAGNOSIS — R7303 Prediabetes: Secondary | ICD-10-CM

## 2022-09-26 DIAGNOSIS — M85859 Other specified disorders of bone density and structure, unspecified thigh: Secondary | ICD-10-CM

## 2022-09-26 DIAGNOSIS — Z79899 Other long term (current) drug therapy: Secondary | ICD-10-CM | POA: Diagnosis not present

## 2022-09-26 DIAGNOSIS — I10 Essential (primary) hypertension: Secondary | ICD-10-CM

## 2022-09-26 DIAGNOSIS — Z0000 Encounter for general adult medical examination without abnormal findings: Secondary | ICD-10-CM

## 2022-09-26 DIAGNOSIS — Z1231 Encounter for screening mammogram for malignant neoplasm of breast: Secondary | ICD-10-CM

## 2022-09-26 DIAGNOSIS — R944 Abnormal results of kidney function studies: Secondary | ICD-10-CM | POA: Diagnosis not present

## 2022-09-26 HISTORY — DX: Chronic kidney disease, stage 4 (severe): N18.4

## 2022-09-26 NOTE — Assessment & Plan Note (Signed)
Disc goals for lipids and reasons to control them Rev last labs with pt Rev low sat fat diet in detail  Continues pravastatin 80 mg  LDL up to 125  Had been on vacation-eating fried seafood/shellfish Will re check in 4-6 wk with better diet   Given info on Mediterranean diet

## 2022-09-26 NOTE — Assessment & Plan Note (Signed)
Reviewed health habits including diet and exercise and skin cancer prevention Reviewed appropriate screening tests for age  Also reviewed health mt list, fam hx and immunization status , as well as social and family history   See HPI Labs reviewed and ordered Due for Td-will check with pharmacy Flu shot given today  Planning 2nd chingrix vaccine  Mammogram due in nov at [facility]  Colonoscopy 11/2020 with 5 y recall /has fam history of colon cancer  Dexa utd 04/2021  Discussed fall prevention and bone health PHQ of 3-discussed some stressors

## 2022-09-26 NOTE — Assessment & Plan Note (Signed)
Lab Results  Component Value Date   VITAMINB12 395 09/19/2022   Last vitamin D Lab Results  Component Value Date   VD25OH 55.06 09/19/2022   Following  GFR is down- watcing this closely-re checking in 4-6 weeks with better water intake

## 2022-09-26 NOTE — Assessment & Plan Note (Signed)
bp in fair control at this time  BP Readings from Last 1 Encounters:  09/26/22 128/76   No changes needed Most recent labs reviewed  Disc lifstyle change with low sodium diet and exercise  Taking hctz 12.5 mg daily   GFR was down to 35.0- will re check in 4-6 wk with better fluids If not improved will change medication

## 2022-09-26 NOTE — Patient Instructions (Addendum)
Ask your pharmacist about a tetanus shot   Get your 2nd shingrix shot when it is due   Flu shot today   Your mammogram is due in November  Call us in October to put the order in   Keep walking  Add some strength training to your routine, this is important for bone and brain health and can reduce your risk of falls and help your body use insulin properly and regulate weight  Light weights, exercise bands , and internet videos are a good way to start  Yoga (chair or regular), machines , floor exercises or a gym with machines are also good options     For cholesterol Avoid red meat/ fried foods/ egg yolks/ fatty breakfast meats/ butter, cheese and high fat dairy/ and shellfish   Work on better diet  Drink 60 oz of fluid daily (mostly water)   Let's re check labs in 4-6 weeks       Mediterranean Diet  Why follow it? Research shows. Those who follow the Mediterranean diet have a reduced risk of heart disease  The diet is associated with a reduced incidence of Parkinson's and Alzheimer's diseases People following the diet may have longer life expectancies and lower rates of chronic diseases  The Dietary Guidelines for Americans recommends the Mediterranean diet as an eating plan to promote health and prevent disease  What Is the Mediterranean Diet?  Healthy eating plan based on typical foods and recipes of Mediterranean-style cooking The diet is primarily a plant based diet; these foods should make up a majority of meals   Starches - Plant based foods should make up a majority of meals - They are an important sources of vitamins, minerals, energy, antioxidants, and fiber - Choose whole grains, foods high in fiber and minimally processed items  - Typical grain sources include wheat, oats, barley, corn, brown rice, bulgar, farro, millet, polenta, couscous  - Various types of beans include chickpeas, lentils, fava beans, black beans, white beans   Fruits  Veggies - Large quantities of  antioxidant rich fruits & veggies; 6 or more servings  - Vegetables can be eaten raw or lightly drizzled with oil and cooked  - Vegetables common to the traditional Mediterranean Diet include: artichokes, arugula, beets, broccoli, brussel sprouts, cabbage, carrots, celery, collard greens, cucumbers, eggplant, kale, leeks, lemons, lettuce, mushrooms, okra, onions, peas, peppers, potatoes, pumpkin, radishes, rutabaga, shallots, spinach, sweet potatoes, turnips, zucchini - Fruits common to the Mediterranean Diet include: apples, apricots, avocados, cherries, clementines, dates, figs, grapefruits, grapes, melons, nectarines, oranges, peaches, pears, pomegranates, strawberries, tangerines  Fats - Replace butter and margarine with healthy oils, such as olive oil, canola oil, and tahini  - Limit nuts to no more than a handful a day  - Nuts include walnuts, almonds, pecans, pistachios, pine nuts  - Limit or avoid candied, honey roasted or heavily salted nuts - Olives are central to the [employer] - can be eaten whole or used in a variety of dishes   Meats Protein - Limiting red meat: no more than a few times a month - When eating red meat: choose lean cuts and keep the portion to the size of deck of cards - Eggs: approx. 0 to 4 times a week  - Fish and lean poultry: at least 2 a week  - Healthy protein sources include, chicken, [place], lean beef, lamb - Increase intake of seafood such as tuna, salmon, trout, mackerel, shrimp, scallops - Avoid or limit high fat processed  meats such as sausage and bacon  Dairy - Include moderate amounts of low fat dairy products  - Focus on healthy dairy such as fat free yogurt, skim milk, low or reduced fat cheese - Limit dairy products higher in fat such as whole or 2% milk, cheese, ice cream  Alcohol - Moderate amounts of red wine is ok  - No more than 5 oz daily for women (all ages) and men older than age 58  - No more than 10 oz of wine daily for men younger  than 3  Other - Limit sweets and other desserts  - Use herbs and spices instead of salt to flavor foods  - Herbs and spices common to the traditional Mediterranean Diet include: basil, bay leaves, chives, cloves, cumin, fennel, garlic, lavender, marjoram, mint, oregano, parsley, pepper, rosemary, sage, savory, sumac, tarragon, thyme   It's not just a diet, it's a lifestyle:  The Mediterranean diet includes lifestyle factors typical of those in the region  Foods, drinks and meals are best eaten with others and savored Daily physical activity is important for overall good health This could be strenuous exercise like running and aerobics This could also be more leisurely activities such as walking, housework, yard-work, or taking the stairs Moderation is the key; a balanced and healthy diet accommodates most foods and drinks Consider portion sizes and frequency of consumption of certain foods   Meal Ideas & Options:  Breakfast:  Whole wheat toast or whole wheat English muffins with peanut butter & hard boiled egg Steel cut oats topped with apples & cinnamon and skim milk  Fresh fruit: banana, strawberries, melon, berries, peaches  Smoothies: strawberries, bananas, greek yogurt, peanut butter Low fat greek yogurt with blueberries and granola  Egg white omelet with spinach and mushrooms Breakfast couscous: whole wheat couscous, apricots, skim milk, cranberries  Sandwiches:  Hummus and grilled vegetables (peppers, zucchini, squash) on whole wheat bread   Grilled chicken on whole wheat pita with lettuce, tomatoes, cucumbers or tzatziki  [place] salad on whole wheat bread: tuna salad made with greek yogurt, olives, red peppers, capers, green onions Garlic rosemary lamb pita: lamb sauted with garlic, rosemary, salt & pepper; add lettuce, cucumber, greek yogurt to pita - flavor with lemon juice and black pepper  Seafood:  Mediterranean grilled salmon, seasoned with garlic, basil, parsley, lemon  juice and black pepper Shrimp, lemon, and spinach whole-grain pasta salad made with low fat greek yogurt  Seared scallops with lemon orzo  Seared tuna steaks seasoned salt, pepper, coriander topped with tomato mixture of olives, tomatoes, olive oil, minced garlic, parsley, green onions and cappers  Meats:  Herbed greek chicken salad with kalamata olives, cucumber, feta  Red bell peppers stuffed with spinach, bulgur, lean ground beef (or lentils) & topped with feta   Kebabs: skewers of chicken, tomatoes, onions, zucchini, squash  [place] burgers: made with red onions, mint, dill, lemon juice, feta cheese topped with roasted red peppers Vegetarian Cucumber salad: cucumbers, artichoke hearts, celery, red onion, feta cheese, tossed in olive oil & lemon juice  Hummus and whole grain pita points with a greek salad (lettuce, tomato, feta, olives, cucumbers, red onion) Lentil soup with celery, carrots made with vegetable broth, garlic, salt and pepper  Tabouli salad: parsley, bulgur, mint, scallions, cucumbers, tomato, radishes, lemon juice, olive oil, salt and pepper.

## 2022-09-26 NOTE — Assessment & Plan Note (Signed)
Last dexa was in normal range 04/2021  Reassuring  No new falls or fracture (broken toe over a y ago) Encouraged her to add strength building exercise

## 2022-09-26 NOTE — Assessment & Plan Note (Signed)
Due for mammo in November Will call for order the mo prior

## 2022-09-26 NOTE — Assessment & Plan Note (Signed)
PHQ of 3  Most of the time ok but does get worried about her son (drug issues)    Continues fluoxetine 40 mg daily  Trazodone for sleep  Offered counseling referral if needed

## 2022-09-26 NOTE — Assessment & Plan Note (Signed)
Lab Results  Component Value Date   HGBA1C 6.2 09/19/2022   disc imp of low glycemic diet and wt loss to prevent DM2

## 2022-09-26 NOTE — Progress Notes (Signed)
Subjective:    Patient ID: Connie Rowe, female    DOB: 1950/09/24, 72 y.o.   MRN: [id number]  HPI  Here for health maintenance exam and to review chronic medical problems   Wt Readings from Last 3 Encounters:  09/26/22 168 lb 8 oz (76.4 kg)  08/24/22 168 lb 4 oz (76.3 kg)  05/31/22 170 lb (77.1 kg)   29.15 kg/m  Vitals:   09/26/22 1025  BP: 128/76  Pulse: 75  Temp: 97.6 F (36.4 C)  SpO2: 95%    Immunization History  Administered Date(s) Administered   Fluad Quad(high Dose 65+) 12/31/2019, 01/04/2021, 10/10/2021   Fluad Trivalent(High Dose 65+) 09/26/2022   Influenza Split 11/17/2010, 12/14/2011   Influenza Whole 12/07/2006, 11/05/2007, 11/10/2008, 01/05/2010   Influenza,inj,Quad PF,6+ Mos 12/17/2012, 12/16/2013, 12/31/2014, 10/15/2015, 11/15/2016, 11/29/2017, 09/24/2018   PFIZER(Purple Top)SARS-COV-2 Vaccination 02/18/2019, 03/11/2019   Pneumococcal Conjugate-13 06/03/2014   Pneumococcal Polysaccharide-23 10/15/2015   Td 10/01/1995, 10/04/2006   Zoster Recombinant(Shingrix) 08/08/2022   Zoster, Live 09/29/2013    Health Maintenance Due  Topic Date Due   DTaP/Tdap/Td (3 - Tdap) 10/03/2016   OPHTHALMOLOGY EXAM  12/02/2019   FOOT EXAM  03/31/2020   Doing well  Went to the beach recently   Tetanus shot - due/ will check with pharmacist   Eye exam - thinks she is due soon/ gets a reminder   Flu shot - today   Shingrix vaccine -- had the first one and planning the 2nd one   Mammogram 11/2021 at [facility] Self breast exam- no lumps   Gyn health- no problems    Colon cancer screening - colonoscopy 11/2020 with 5 y recall  Mother had colon cancer in her 19s    Bone health  Dexa 04/2021  normal BMD Falls- none Fractures-none (last a year ago - fractured toe) Supplements ca and D  Exercise : walking  Some exercise at home with small weights    Mood    09/26/2022   10:28 AM 08/24/2022   11:01 AM 05/31/2022    3:15 PM 04/30/2020    9:14 AM 04/01/2019     9:49 AM  Depression screen PHQ 2/9  Decreased Interest 0 0 0 0 0  Down, Depressed, Hopeless 0 0 0 0 0  PHQ - 2 Score 0 0 0 0 0  Altered sleeping 2 3  0 2  Tired, decreased energy 1 0  0 0  Change in appetite 2 0  0 3  Feeling bad or failure about yourself  1 0  0 0  Trouble concentrating 0 0  0 0  Moving slowly or fidgety/restless 0 0  0 0  Suicidal thoughts 0 0  0 0  PHQ-9 Score 6 3  0 5  Difficult doing work/chores Not difficult at all Not difficult at all  Not difficult at all Not difficult at all  Doing ok most of the time  Some stress-son is going through a hard time with drugs  (she had to raise her grandson)   Fluoxetine 40 mg daily  Trazodone prn sleep   HTN bp is stable today  No cp or palpitations or headaches or edema  No side effects to medicines  BP Readings from Last 3 Encounters:  09/26/22 128/76  08/24/22 137/80  04/11/22 (!) 165/81    Hydrochlorothiazide 12.5 mg daily   Lab Results  Component Value Date   NA 140 09/19/2022   K 3.8 09/19/2022   CO2 31 09/19/2022   GLUCOSE  121 (H) 09/19/2022   BUN 20 09/19/2022   CREATININE 1.47 (H) 09/19/2022   CALCIUM 9.1 09/19/2022   GFR 35.04 (L) 09/19/2022   GFRNONAA 48 (L) 09/14/2021   History of gross hematuria  Say urology Had CT renal scan/normal  Cystoscopy noted inflamed area in bladder wall (was not malignancy)   Water intake is up and down  Avoid the nsaids  No uti symptoms   GERD with history of chronic gastritis  Protonix 40 mg daily  Loves spicy food -eating less of it   Hyperlipidemia Lab Results  Component Value Date   CHOL 229 (H) 09/19/2022   CHOL 182 10/10/2021   CHOL 193 04/30/2020   Lab Results  Component Value Date   HDL 67.50 09/19/2022   HDL 79.50 10/10/2021   HDL 69.40 04/30/2020   Lab Results  Component Value Date   LDLCALC 125 (H) 09/19/2022   LDLCALC 80 10/10/2021   LDLCALC 91 04/30/2020   Lab Results  Component Value Date   TRIG 182.0 (H) 09/19/2022    TRIG 115.0 10/10/2021   TRIG 161.0 (H) 04/30/2020   Lab Results  Component Value Date   CHOLHDL 3 09/19/2022   CHOLHDL 2 10/10/2021   CHOLHDL 3 04/30/2020   Lab Results  Component Value Date   LDLDIRECT 126.0 06/03/2014   LDLDIRECT 122.4 09/14/2010   LDLDIRECT 132.2 04/16/2008   Pravastatin 80 mg daily  Diet has not changed   Rarely eats fried foods  Eats beef some  Eating a lot of ice cream  More shellfish this summer     Prediabetic  Lab Results  Component Value Date   HGBA1C 6.2 09/19/2022   Improved       Patient Active Problem List   Diagnosis Date Noted   Decreased GFR 09/26/2022   Current use of proton pump inhibitor 09/18/2022   Pain in joints of right hand 08/24/2022   Digital mucinous cyst of finger 08/24/2022   Seborrheic keratoses 08/24/2022   Dysuria 12/16/2021   Screening mammogram for breast cancer 10/10/2021   Family hx of colon cancer 05/07/2020   Cervical disc disease 08/19/2019   Right hand paresthesia 08/18/2019   Right arm pain 08/18/2019   Right shoulder pain 08/18/2019   Atypical chest pain 10/14/2018   Dysphagia 10/14/2018   Allergic rhinitis 09/03/2017   Insomnia 11/27/2016   Daytime somnolence 05/23/2016   Snoring 05/23/2016   Estrogen deficiency 10/19/2015   Need for hepatitis C screening test 09/27/2015   S/P right THA, AA 03/30/2015   Pre-operative examination 02/24/2015   S/P lumbar laminectomy 09/23/2014   Essential hypertension 06/26/2014   Encounter for Medicare annual wellness exam 06/03/2014   Colon cancer screening 06/03/2014   Sciatica 11/26/2013   Prediabetes 04/17/2013   Hirsutism 12/06/2010   Routine general medical examination at a health care facility 09/10/2010   Other screening mammogram 09/01/2010   Osteopenia 10/22/2006   URINARY INCONTINENCE, STRESS 08/22/2006   History of Helicobacter pylori infection 04/18/2006   Hyperlipidemia associated with type 2 diabetes mellitus (HCC) 04/18/2006    Depression with anxiety 04/18/2006   EXTERNAL HEMORRHOIDS 04/18/2006   GERD 04/18/2006   HIATAL HERNIA 04/18/2006   OVERACTIVE BLADDER 04/18/2006   Hematuria 04/18/2006   PANCREATITIS, HX OF 04/18/2006   DIVERTICULOSIS, COLON 01/02/2000   Past Medical History:  Diagnosis Date   Allergy    Anxiety    Arthritis    Colon polyps    Depression    Diabetes mellitus without complication (HCC)  Esophageal stricture    Falls    Pt denies   GERD (gastroesophageal reflux disease)    H/O fracture of humerus 01/31/2004   History of bronchitis    History of hiatal hernia    Hyperlipidemia    Hypertension    Nocturia    Osteopenia    Pancreatitis    History of   Sleep apnea    uses CPAP   Wears glasses    reading glasses  04/06/2022   Past Surgical History:  Procedure Laterality Date   BLADDER REPAIR     COLONOSCOPY     CYSTOSCOPY W/ RETROGRADES Bilateral 04/11/2022   Procedure: CYSTOSCOPY WITH RETROGRADE PYELOGRAM;  Surgeon: Belva Agee, MD;  Location: [facility];  Service: Urology;  Laterality: Bilateral;   CYSTOSCOPY WITH BIOPSY N/A 04/11/2022   Procedure: CYSTOSCOPY WITH BIOPSY AND FULGERATION;  Surgeon: Belva Agee, MD;  Location: [facility];  Service: Urology;  Laterality: N/A;   DIAGNOSTIC LAPAROSCOPY     tubal ligation   DILATION AND CURETTAGE OF UTERUS     ESOPHAGEAL DILATION     JOINT REPLACEMENT     LUMBAR LAMINECTOMY/DECOMPRESSION MICRODISCECTOMY Right 09/23/2014   Procedure: Laminectomy and Foraminotomy - Lumbar three- lumbar four - right ;  Surgeon: Tia Alert, MD;  Location: [facility];  Service: Neurosurgery;  Laterality: Right;   MULTIPLE TOOTH EXTRACTIONS     TONSILLECTOMY     TOTAL HIP ARTHROPLASTY Right 03/30/2015   Procedure: RIGHT TOTAL HIP ARTHROPLASTY ANTERIOR APPROACH;  Surgeon: Durene Romans, MD;  Location: [facility];  Service: Orthopedics;  Laterality: Right;   TOTAL HIP ARTHROPLASTY Left 01/09/2017    Procedure: LEFT TOTAL HIP ARTHROPLASTY ANTERIOR APPROACH;  Surgeon: Durene Romans, MD;  Location: [facility];  Service: Orthopedics;  Laterality: Left;  70 mins   TUBAL LIGATION     UPPER GASTROINTESTINAL ENDOSCOPY     UPPER GI ENDOSCOPY     Social History   Tobacco Use   Smoking status: Never   Smokeless tobacco: Never  Vaping Use   Vaping status: Never Used  Substance Use Topics   Alcohol use: Yes    Comment: very rarely   Drug use: No   Family History  Problem Relation Age of Onset   Stroke Mother    Hypertension Mother    Colon cancer Mother    Stroke Brother    Drug abuse Son    Colon polyps Neg Hx    Crohn's disease Neg Hx    Pancreatic cancer Neg Hx    Rectal cancer Neg Hx    Stomach cancer Neg Hx    Ulcerative colitis Neg Hx    Breast cancer Neg Hx    Esophageal cancer Neg Hx    Allergies  Allergen Reactions   Ace Inhibitors Cough   Ambien [Zolpidem Tartrate] Other (See Comments)    Affected memory   Current Outpatient Medications on File Prior to Visit  Medication Sig Dispense Refill   Calcium Carbonate-Vitamin D (CALCIUM-VITAMIN D) 500-200 MG-UNIT per tablet Take 1 tablet by mouth daily.     diphenhydrAMINE (BENADRYL) 25 mg capsule Take 25 mg by mouth every 6 (six) hours as needed.     FLUoxetine (PROZAC) 40 MG capsule Take 1 capsule (40 mg total) by mouth daily. 90 capsule 1   fluticasone (FLONASE) 50 MCG/ACT nasal spray Place 2 sprays into both nostrils daily. 48 g 1   hydrochlorothiazide (MICROZIDE) 12.5 MG capsule Take 1 capsule (12.5  mg total) by mouth daily. 90 capsule 1   Multiple Vitamins-Minerals (MULTIVITAMIN PO) Take 1 tablet by mouth daily.     pantoprazole (PROTONIX) 40 MG tablet Take 1 tablet (40 mg total) by mouth daily. 90 tablet 1   pravastatin (PRAVACHOL) 80 MG tablet Take 1 tablet (80 mg total) by mouth daily. 90 tablet 1   traZODone (DESYREL) 50 MG tablet TAKE 1/2 TO 1 TABLET AT BEDTIME AS NEEDED FOR SLEEP 90 tablet 1   No current  facility-administered medications on file prior to visit.    Review of Systems  Constitutional:  Negative for activity change, appetite change, fatigue, fever and unexpected weight change.  HENT:  Negative for congestion, ear pain, rhinorrhea, sinus pressure and sore throat.   Eyes:  Negative for pain, redness and visual disturbance.  Respiratory:  Negative for cough, shortness of breath and wheezing.   Cardiovascular:  Negative for chest pain and palpitations.  Gastrointestinal:  Negative for abdominal pain, blood in stool, constipation and diarrhea.  Endocrine: Negative for polydipsia and polyuria.  Genitourinary:  Negative for dysuria, frequency and urgency.  Musculoskeletal:  Negative for arthralgias, back pain and myalgias.  Skin:  Negative for pallor and rash.  Allergic/Immunologic: Negative for environmental allergies.  Neurological:  Negative for dizziness, syncope and headaches.  Hematological:  Negative for adenopathy. Does not bruise/bleed easily.  Psychiatric/Behavioral:  Negative for decreased concentration and dysphoric mood. The patient is nervous/anxious.        Some worry and stress       Objective:   Physical Exam Constitutional:      General: She is not in acute distress.    Appearance: Normal appearance. She is well-developed. She is obese. She is not ill-appearing or diaphoretic.  HENT:     Head: Normocephalic and atraumatic.     Right Ear: Tympanic membrane, ear canal and external ear normal.     Left Ear: Tympanic membrane, ear canal and external ear normal.     Nose: Nose normal. No congestion.     Mouth/Throat:     Mouth: Mucous membranes are moist.     Pharynx: Oropharynx is clear. No posterior oropharyngeal erythema.  Eyes:     General: No scleral icterus.    Extraocular Movements: Extraocular movements intact.     Conjunctiva/sclera: Conjunctivae normal.     Pupils: Pupils are equal, round, and reactive to light.  Neck:     Thyroid: No thyromegaly.      Vascular: No carotid bruit or JVD.  Cardiovascular:     Rate and Rhythm: Normal rate and regular rhythm.     Pulses: Normal pulses.     Heart sounds: Normal heart sounds.     No gallop.  Pulmonary:     Effort: Pulmonary effort is normal. No respiratory distress.     Breath sounds: Normal breath sounds. No wheezing.     Comments: Good air exch Chest:     Chest wall: No tenderness.  Abdominal:     General: Bowel sounds are normal. There is no distension or abdominal bruit.     Palpations: Abdomen is soft. There is no mass.     Tenderness: There is no abdominal tenderness.     Hernia: No hernia is present.  Genitourinary:    Comments: Breast exam: No mass, nodules, thickening, tenderness, bulging, retraction, inflamation, nipple discharge or skin changes noted.  No axillary or clavicular LA.     Musculoskeletal:        General: No tenderness.  Normal range of motion.     Cervical back: Normal range of motion and neck supple. No rigidity. No muscular tenderness.     Right lower leg: No edema.     Left lower leg: No edema.     Comments: No kyphosis   Lymphadenopathy:     Cervical: No cervical adenopathy.  Skin:    General: Skin is warm and dry.     Coloration: Skin is not pale.     Findings: No erythema or rash.     Comments: Solar lentigines diffusely   Neurological:     Mental Status: She is alert. Mental status is at baseline.     Cranial Nerves: No cranial nerve deficit.     Motor: No abnormal muscle tone.     Coordination: Coordination normal.     Gait: Gait normal.     Deep Tendon Reflexes: Reflexes are normal and symmetric. Reflexes normal.  Psychiatric:        Mood and Affect: Mood normal.        Cognition and Memory: Cognition and memory normal.           Assessment & Plan:   Problem List Items Addressed This Visit       Cardiovascular and Mediastinum   Essential hypertension    bp in fair control at this time  BP Readings from Last 1 Encounters:   09/26/22 128/76   No changes needed Most recent labs reviewed  Disc lifstyle change with low sodium diet and exercise  Taking hctz 12.5 mg daily   GFR was down to 35.0- will re check in 4-6 wk with better fluids If not improved will change medication       Relevant Orders   Basic metabolic panel     Endocrine   Hyperlipidemia associated with type 2 diabetes mellitus (HCC)    Disc goals for lipids and reasons to control them Rev last labs with pt Rev low sat fat diet in detail  Continues pravastatin 80 mg  LDL up to 125  Had been on vacation-eating fried seafood/shellfish Will re check in 4-6 wk with better diet   Given info on Mediterranean diet       Relevant Orders   Lipid panel     Musculoskeletal and Integument   Osteopenia    Last dexa was in normal range 04/2021  Reassuring  No new falls or fracture (broken toe over a y ago) Encouraged her to add strength building exercise         Other   Current use of proton pump inhibitor    Lab Results  Component Value Date   VITAMINB12 395 09/19/2022   Last vitamin D Lab Results  Component Value Date   VD25OH 55.06 09/19/2022   Following  GFR is down- watcing this closely-re checking in 4-6 weeks with better water intake       Decreased GFR    GFR 35.04 Takes low dose hydrochlorothiazide for blood pressure   Not drinking as much water?  Did see urology for bladder issue/not infection   Plan to increase fluid Re check 4-6 wk If not improved will have to change to different blood pressure agent       Relevant Orders   Basic metabolic panel   Depression with anxiety    PHQ of 3  Most of the time ok but does get worried about her son (drug issues)    Continues fluoxetine 40 mg daily  Trazodone for sleep  Offered counseling referral if needed       Prediabetes    Lab Results  Component Value Date   HGBA1C 6.2 09/19/2022   disc imp of low glycemic diet and wt loss to prevent DM2        Routine general medical examination at a health care facility - Primary    Reviewed health habits including diet and exercise and skin cancer prevention Reviewed appropriate screening tests for age  Also reviewed health mt list, fam hx and immunization status , as well as social and family history   See HPI Labs reviewed and ordered Due for Td-will check with pharmacy Flu shot given today  Planning 2nd chingrix vaccine  M[facility] due in nov [facility]  Colonoscopy 11/2020 with 5 y recall /has fam history of colon cancer  Dexa utd 04/2021  Discussed fall prevention and bone health PHQ of 3-discussed some stressors        Screening mammogram for breast cancer    Due for mammo in November Will call for order the mo prior       Other Visit Diagnoses     Need for influenza vaccination       Relevant Orders   Flu Vaccine Trivalent High Dose (Fluad) (Completed)

## 2022-09-26 NOTE — Assessment & Plan Note (Signed)
GFR 35.04 Takes low dose hydrochlorothiazide for blood pressure   Not drinking as much water?  Did see urology for bladder issue/not infection   Plan to increase fluid Re check 4-6 wk If not improved will have to change to different blood pressure agent

## 2022-10-31 ENCOUNTER — Other Ambulatory Visit (INDEPENDENT_AMBULATORY_CARE_PROVIDER_SITE_OTHER): Payer: Medicare HMO

## 2022-10-31 DIAGNOSIS — E785 Hyperlipidemia, unspecified: Secondary | ICD-10-CM

## 2022-10-31 DIAGNOSIS — R944 Abnormal results of kidney function studies: Secondary | ICD-10-CM

## 2022-10-31 DIAGNOSIS — I10 Essential (primary) hypertension: Secondary | ICD-10-CM | POA: Diagnosis not present

## 2022-10-31 DIAGNOSIS — E1169 Type 2 diabetes mellitus with other specified complication: Secondary | ICD-10-CM | POA: Diagnosis not present

## 2022-10-31 LAB — BASIC METABOLIC PANEL
BUN: 15 mg/dL (ref 6–23)
CO2: 31 meq/L (ref 19–32)
Calcium: 9.8 mg/dL (ref 8.4–10.5)
Chloride: 100 meq/L (ref 96–112)
Creatinine, Ser: 1.46 mg/dL — ABNORMAL HIGH (ref 0.40–1.20)
GFR: 35.3 mL/min — ABNORMAL LOW (ref >60.00)
Glucose, Bld: 102 mg/dL — ABNORMAL HIGH (ref 70–99)
Potassium: 3.8 meq/L (ref 3.5–5.1)
Sodium: 139 meq/L (ref 135–145)

## 2022-10-31 LAB — LIPID PANEL
Cholesterol: 195 mg/dL (ref 0–200)
HDL: 72 mg/dL (ref >39.00)
LDL Cholesterol: 97 mg/dL (ref 0–99)
NonHDL: 122.53
Total CHOL/HDL Ratio: 3
Triglycerides: 128 mg/dL (ref 0.0–149.0)
VLDL: 25.6 mg/dL (ref 0.0–40.0)

## 2022-11-02 ENCOUNTER — Telehealth: Payer: Self-pay | Admitting: *Deleted

## 2022-11-02 MED ORDER — AMLODIPINE BESYLATE 5 MG PO TABS: 5.0000 mg | ORAL_TABLET | Freq: Every day | ORAL | 1 refills | Status: DC

## 2022-11-02 NOTE — Telephone Encounter (Signed)
-----   Message from [place] sent at 10/31/2022  7:27 PM EDT ----- Despite good hydration, kidney labs are still not optimal  The hydrochlorothiazide is likely overworking kidneys  Please stop the hydrochlorothiazide  Send in amlodipine 5 mg daily #30 1 refill  Follow up with me in about 3-4 weeks (we will check labs the day of that visit)

## 2022-11-02 NOTE — Telephone Encounter (Signed)
Pt notified of lab results and Dr. Royden Purl comments. Rx sent to pharmacy and HCTZ d/c off med list. F/u appt with PCP scheduled

## 2022-11-23 ENCOUNTER — Other Ambulatory Visit: Payer: Self-pay | Admitting: Family Medicine

## 2022-11-23 DIAGNOSIS — Z1231 Encounter for screening mammogram for malignant neoplasm of breast: Secondary | ICD-10-CM

## 2022-12-05 ENCOUNTER — Encounter: Payer: Self-pay | Admitting: Family Medicine

## 2022-12-05 ENCOUNTER — Ambulatory Visit: Payer: Medicare HMO | Admitting: Family Medicine

## 2022-12-05 VITALS — BP 135/80 | HR 74 | Temp 98.2°F | Ht 63.75 in | Wt 174.2 lb

## 2022-12-05 DIAGNOSIS — R3129 Other microscopic hematuria: Secondary | ICD-10-CM | POA: Diagnosis not present

## 2022-12-05 DIAGNOSIS — E7800 Pure hypercholesterolemia, unspecified: Secondary | ICD-10-CM

## 2022-12-05 DIAGNOSIS — R944 Abnormal results of kidney function studies: Secondary | ICD-10-CM

## 2022-12-05 DIAGNOSIS — I10 Essential (primary) hypertension: Secondary | ICD-10-CM

## 2022-12-05 LAB — LIPID PANEL
Cholesterol: 205 mg/dL — ABNORMAL HIGH (ref 0–200)
HDL: 75 mg/dL (ref >39.00)
LDL Cholesterol: 86 mg/dL (ref 0–99)
NonHDL: 130.39
Total CHOL/HDL Ratio: 3
Triglycerides: 220 mg/dL — ABNORMAL HIGH (ref 0.0–149.0)
VLDL: 44 mg/dL — ABNORMAL HIGH (ref 0.0–40.0)

## 2022-12-05 LAB — BASIC METABOLIC PANEL
BUN: 15 mg/dL (ref 6–23)
CO2: 29 meq/L (ref 19–32)
Calcium: 9.5 mg/dL (ref 8.4–10.5)
Chloride: 102 meq/L (ref 96–112)
Creatinine, Ser: 1.36 mg/dL — ABNORMAL HIGH (ref 0.40–1.20)
GFR: 38.42 mL/min — ABNORMAL LOW (ref >60.00)
Glucose, Bld: 101 mg/dL — ABNORMAL HIGH (ref 70–99)
Potassium: 3.7 meq/L (ref 3.5–5.1)
Sodium: 140 meq/L (ref 135–145)

## 2022-12-05 LAB — POCT UA - MICROSCOPIC ONLY

## 2022-12-05 LAB — POC URINALSYSI DIPSTICK (AUTOMATED)
Bilirubin, UA: 1
Blood, UA: 25 — AB
Glucose, UA: NEGATIVE
Ketones, UA: NEGATIVE
Leukocytes, UA: NEGATIVE
Nitrite, UA: NEGATIVE
Protein, UA: POSITIVE — AB
Spec Grav, UA: 1.025 (ref 1.010–1.025)
Urobilinogen, UA: 0.2 U/dL
pH, UA: 6 (ref 5.0–8.0)

## 2022-12-05 MED ORDER — AMLODIPINE BESYLATE 5 MG PO TABS: 5.0000 mg | ORAL_TABLET | Freq: Every day | ORAL | 3 refills | Status: DC

## 2022-12-05 NOTE — Progress Notes (Signed)
Subjective:    Patient ID: Connie Rowe, female    DOB: 1950-06-09, 72 y.o.   MRN: [id number]  HPI  Wt Readings from Last 3 Encounters:  12/05/22 174 lb 4 oz (79 kg)  09/26/22 168 lb 8 oz (76.4 kg)  08/24/22 168 lb 4 oz (76.3 kg)   30.14 kg/m  Vitals:   12/05/22 0959 12/05/22 1019  BP: (!) 148/80 135/80  Pulse: 74   Temp: 98.2 F (36.8 C)   SpO2: 96%     Pt presents for follow up of decreased GFR and hyperlipidemia and HTN    Last visit noted low GFR Encouraged to drink more water   Re check  Lab Results  Component Value Date   NA 139 10/31/2022   K 3.8 10/31/2022   CO2 31 10/31/2022   GLUCOSE 102 (H) 10/31/2022   BUN 15 10/31/2022   CREATININE 1.46 (H) 10/31/2022   CALCIUM 9.8 10/31/2022   GFR 35.30 (L) 10/31/2022   GFRNONAA 48 (L) 09/14/2021   We instructed her to stop the hydrochlorothiazide 12.5 and replaced it with amlodipine 5 mg daily  No side effects or problems   HTN bp is stable today  No cp or palpitations or headaches or edema  No side effects to medicines  BP Readings from Last 3 Encounters:  12/05/22 135/80  09/26/22 128/76  08/24/22 137/80    Taking care of herself   Eating healthy  Exercising   No uti symptoms  No burning or frequency or urgency   Urinalysis today  Results for orders placed or performed in visit on 12/05/22  POCT Urinalysis Dipstick (Automated)  Result Value Ref Range   Color, UA Yellow    Clarity, UA Clear    Glucose, UA Negative Negative   Bilirubin, UA 1 mg/dL    Ketones, UA Negative    Spec Grav, UA 1.025 1.010 - 1.025   Blood, UA 25 Ery/uL (A)    pH, UA 6.0 5.0 - 8.0   Protein, UA Positive (A) Negative   Urobilinogen, UA 0.2 0.2 or 1.0 E.U./dL   Nitrite, UA Negative    Leukocytes, UA Negative Negative  POCT UA - Microscopic Only  Result Value Ref Range   WBC, Ur, HPF, POC 1-3 0 - 5   RBC, Urine, Miroscopic 3-8 0 - 2   Bacteria, U Microscopic few None - Trace   Mucus, UA mod    Epithelial  cells, urine per micros mod    Crystals, Ur, HPF, POC none    Casts, Ur, LPF, POC none    Yeast, UA none    Has been to urology for micro hematuria in the past  Had CT as well as cystoscopy and bx (normal) this spring at [facility] urology  All reassuring      Prediabetes  Lab Results  Component Value Date   HGBA1C 6.2 09/19/2022     Patient Active Problem List   Diagnosis Date Noted   Decreased GFR 09/26/2022   Current use of proton pump inhibitor 09/18/2022   Pain in joints of right hand 08/24/2022   Digital mucinous cyst of finger 08/24/2022   Seborrheic keratoses 08/24/2022   Screening mammogram for breast cancer 10/10/2021   Family hx of colon cancer 05/07/2020   Cervical disc disease 08/19/2019   Right hand paresthesia 08/18/2019   Right arm pain 08/18/2019   Right shoulder pain 08/18/2019   Dysphagia 10/14/2018   Allergic rhinitis 09/03/2017   Insomnia 11/27/2016  Daytime somnolence 05/23/2016   Snoring 05/23/2016   Estrogen deficiency 10/19/2015   Need for hepatitis C screening test 09/27/2015   S/P right THA, AA 03/30/2015   Pre-operative examination 02/24/2015   S/P lumbar laminectomy 09/23/2014   Essential hypertension 06/26/2014   Encounter for Medicare annual wellness exam 06/03/2014   Colon cancer screening 06/03/2014   Sciatica 11/26/2013   Prediabetes 04/17/2013   Hirsutism 12/06/2010   Routine general medical examination at a health care facility 09/10/2010   Other screening mammogram 09/01/2010   Osteopenia 10/22/2006   URINARY INCONTINENCE, STRESS 08/22/2006   History of Helicobacter pylori infection 04/18/2006   Hyperlipidemia 04/18/2006   Depression with anxiety 04/18/2006   External hemorrhoids 04/18/2006   GERD 04/18/2006   Diaphragmatic hernia 04/18/2006   OVERACTIVE BLADDER 04/18/2006   Hematuria 04/18/2006   History of digestive system disease 04/18/2006   Diverticulosis of colon 01/02/2000   Past Medical History:  Diagnosis  Date   Allergy    Anxiety    Arthritis    Colon polyps    Depression    Diabetes mellitus without complication (HCC)    Esophageal stricture    Falls    Pt denies   GERD (gastroesophageal reflux disease)    H/O fracture of humerus 01/31/2004   History of bronchitis    History of hiatal hernia    Hyperlipidemia    Hypertension    Nocturia    Osteopenia    Pancreatitis    History of   Sleep apnea    uses CPAP   Wears glasses    reading glasses  04/06/2022   Past Surgical History:  Procedure Laterality Date   BLADDER REPAIR     COLONOSCOPY     CYSTOSCOPY W/ RETROGRADES Bilateral 04/11/2022   Procedure: CYSTOSCOPY WITH RETROGRADE PYELOGRAM;  Surgeon: Belva Agee, MD;  Location: [facility];  Service: Urology;  Laterality: Bilateral;   CYSTOSCOPY WITH BIOPSY N/A 04/11/2022   Procedure: CYSTOSCOPY WITH BIOPSY AND FULGERATION;  Surgeon: Belva Agee, MD;  Location: [facility];  Service: Urology;  Laterality: N/A;   DIAGNOSTIC LAPAROSCOPY     tubal ligation   DILATION AND CURETTAGE OF UTERUS     ESOPHAGEAL DILATION     JOINT REPLACEMENT     LUMBAR LAMINECTOMY/DECOMPRESSION MICRODISCECTOMY Right 09/23/2014   Procedure: Laminectomy and Foraminotomy - Lumbar three- lumbar four - right ;  Surgeon: Tia Alert, MD;  Location: [facility];  Service: Neurosurgery;  Laterality: Right;   MULTIPLE TOOTH EXTRACTIONS     TONSILLECTOMY     TOTAL HIP ARTHROPLASTY Right 03/30/2015   Procedure: RIGHT TOTAL HIP ARTHROPLASTY ANTERIOR APPROACH;  Surgeon: Durene Romans, MD;  Location: [facility];  Service: Orthopedics;  Laterality: Right;   TOTAL HIP ARTHROPLASTY Left 01/09/2017   Procedure: LEFT TOTAL HIP ARTHROPLASTY ANTERIOR APPROACH;  Surgeon: Durene Romans, MD;  Location: [facility];  Service: Orthopedics;  Laterality: Left;  70 mins   TUBAL LIGATION     UPPER GASTROINTESTINAL ENDOSCOPY     UPPER GI ENDOSCOPY     Social History   Tobacco Use   Smoking  status: Never   Smokeless tobacco: Never  Vaping Use   Vaping status: Never Used  Substance Use Topics   Alcohol use: Yes    Comment: very rarely   Drug use: No   Family History  Problem Relation Age of Onset   Stroke Mother    Hypertension Mother    Colon cancer Mother  Stroke Brother    Drug abuse Son    Colon polyps Neg Hx    Crohn's disease Neg Hx    Pancreatic cancer Neg Hx    Rectal cancer Neg Hx    Stomach cancer Neg Hx    Ulcerative colitis Neg Hx    Breast cancer Neg Hx    Esophageal cancer Neg Hx    Allergies  Allergen Reactions   Ace Inhibitors Cough   Ambien [Zolpidem Tartrate] Other (See Comments)    Affected memory   Current Outpatient Medications on File Prior to Visit  Medication Sig Dispense Refill   Calcium Carbonate-Vitamin D (CALCIUM-VITAMIN D) 500-200 MG-UNIT per tablet Take 1 tablet by mouth daily.     diphenhydrAMINE (BENADRYL) 25 mg capsule Take 25 mg by mouth every 6 (six) hours as needed.     FLUoxetine (PROZAC) 40 MG capsule Take 1 capsule (40 mg total) by mouth daily. 90 capsule 1   fluticasone (FLONASE) 50 MCG/ACT nasal spray Place 2 sprays into both nostrils daily. 48 g 1   Multiple Vitamins-Minerals (MULTIVITAMIN PO) Take 1 tablet by mouth daily.     pantoprazole (PROTONIX) 40 MG tablet Take 1 tablet (40 mg total) by mouth daily. 90 tablet 1   pravastatin (PRAVACHOL) 80 MG tablet Take 1 tablet (80 mg total) by mouth daily. 90 tablet 1   traZODone (DESYREL) 50 MG tablet TAKE 1/2 TO 1 TABLET AT BEDTIME AS NEEDED FOR SLEEP 90 tablet 1   No current facility-administered medications on file prior to visit.    Review of Systems  Constitutional:  Negative for activity change, appetite change, fatigue, fever and unexpected weight change.  HENT:  Negative for congestion, ear pain, rhinorrhea, sinus pressure and sore throat.   Eyes:  Negative for pain, redness and visual disturbance.  Respiratory:  Negative for cough, shortness of breath and  wheezing.   Cardiovascular:  Negative for chest pain and palpitations.  Gastrointestinal:  Negative for abdominal pain, blood in stool, constipation and diarrhea.  Endocrine: Negative for polydipsia and polyuria.  Genitourinary:  Negative for dysuria, frequency and urgency.  Musculoskeletal:  Negative for arthralgias, back pain and myalgias.  Skin:  Negative for pallor and rash.  Allergic/Immunologic: Negative for environmental allergies.  Neurological:  Negative for dizziness, syncope and headaches.  Hematological:  Negative for adenopathy. Does not bruise/bleed easily.  Psychiatric/Behavioral:  Negative for decreased concentration and dysphoric mood. The patient is not nervous/anxious.        Objective:   Physical Exam Constitutional:      General: She is not in acute distress.    Appearance: Normal appearance. She is well-developed. She is obese. She is not ill-appearing or diaphoretic.  HENT:     Head: Normocephalic and atraumatic.  Eyes:     Conjunctiva/sclera: Conjunctivae normal.     Pupils: Pupils are equal, round, and reactive to light.  Neck:     Thyroid: No thyromegaly.     Vascular: No carotid bruit or JVD.  Cardiovascular:     Rate and Rhythm: Normal rate and regular rhythm.     Heart sounds: Normal heart sounds.     No gallop.  Pulmonary:     Effort: Pulmonary effort is normal. No respiratory distress.     Breath sounds: Normal breath sounds. No wheezing or rales.  Abdominal:     General: There is no distension or abdominal bruit.     Palpations: Abdomen is soft.  Musculoskeletal:     Cervical back: Normal  range of motion and neck supple.     Right lower leg: No edema.     Left lower leg: No edema.  Lymphadenopathy:     Cervical: No cervical adenopathy.  Skin:    General: Skin is warm and dry.     Coloration: Skin is not pale.     Findings: No rash.  Neurological:     Mental Status: She is alert.     Cranial Nerves: No cranial nerve deficit.      Coordination: Coordination normal.     Deep Tendon Reflexes: Reflexes are normal and symmetric. Reflexes normal.  Psychiatric:        Mood and Affect: Mood normal.           Assessment & Plan:   Problem List Items Addressed This Visit       Cardiovascular and Mediastinum   Essential hypertension    bp in fair control at this time  BP Readings from Last 1 Encounters:  12/05/22 135/80  Now taking amlodipine 5 mg daily  Stopped hydrochlorothiazide due to dec GFR  No changes needed Most recent labs reviewed  Disc lifstyle change with low sodium diet and exercise  Given info on DASH eating        Relevant Medications   amLODipine (NORVASC) 5 MG tablet     Other   Decreased GFR - Primary    GFR is 35.3 in pt with HTN and prediabetes, who also takes ppi  With better hydration not much change  We then changed her blood pressure med from hydrochlorothiazide to amlodipine Blood pressure is fairly controlled / she tolerates this  No urinary symptoms  Today Urinalysis some blood and protein (blood is baseline-reviewed urology notes) but still sent a culture  Bmet today  Continue to follow  Given handout on ckd as well       Relevant Orders   Basic metabolic panel   POCT Urinalysis Dipstick (Automated) (Completed)   POCT UA - Microscopic Only (Completed)   Urine Culture   Hematuria    Baseline microscopic Sent for culture In setting of reduced GFR  Reviewed urology notes/ CT/ cysto/ bx from this spring was reassuring  Irritated area in bladder -bx normal         Relevant Orders   POCT UA - Microscopic Only (Completed)   Urine Culture   Hyperlipidemia    Last LDL was up  Disc goals for lipids and reasons to control them Rev last labs with pt Rev low sat fat diet in detail On pravastatin 80 mg daily   Now eating better (last check was after vacation eating fried seafood)  Lab today       Relevant Medications   amLODipine (NORVASC) 5 MG tablet    Other Relevant Orders   Lipid panel

## 2022-12-05 NOTE — Assessment & Plan Note (Addendum)
GFR is 35.3 in pt with HTN and prediabetes, who also takes ppi  With better hydration not much change  We then changed her blood pressure med from hydrochlorothiazide to amlodipine Blood pressure is fairly controlled / she tolerates this  No urinary symptoms  Today Urinalysis some blood and protein (blood is baseline-reviewed urology notes) but still sent a culture  Bmet today  Continue to follow  Given handout on ckd as well

## 2022-12-05 NOTE — Patient Instructions (Addendum)
Avoid processed foods when able  See the DASH eating plan    Lab for cholesterol and kidney function  Also urinalysis    Continue current medicines

## 2022-12-05 NOTE — Assessment & Plan Note (Signed)
Baseline microscopic Sent for culture In setting of reduced GFR  Reviewed urology notes/ CT/ cysto/ bx from this spring was reassuring  Irritated area in bladder -bx normal

## 2022-12-05 NOTE — Assessment & Plan Note (Signed)
Last LDL was up  Disc goals for lipids and reasons to control them Rev last labs with pt Rev low sat fat diet in detail On pravastatin 80 mg daily   Now eating better (last check was after vacation eating fried seafood)  Lab today

## 2022-12-05 NOTE — Assessment & Plan Note (Signed)
bp in fair control at this time  BP Readings from Last 1 Encounters:  12/05/22 135/80  Now taking amlodipine 5 mg daily  Stopped hydrochlorothiazide due to dec GFR  No changes needed Most recent labs reviewed  Disc lifstyle change with low sodium diet and exercise  Given info on DASH eating

## 2022-12-06 LAB — URINE CULTURE
MICRO NUMBER:: 15688400
Result:: NO GROWTH
SPECIMEN QUALITY:: ADEQUATE

## 2022-12-07 ENCOUNTER — Encounter: Payer: Self-pay | Admitting: *Deleted

## 2022-12-07 NOTE — Addendum Note (Signed)
Addended by: Roxy Manns A on: 12/07/2022 04:24 PM   Modules accepted: Orders

## 2022-12-15 ENCOUNTER — Ambulatory Visit
Admission: RE | Admit: 2022-12-15 | Discharge: 2022-12-15 | Disposition: A | Discharge status: Home / Self Care | Payer: Medicare HMO | Source: Ambulatory Visit | Attending: Family Medicine | Admitting: Family Medicine

## 2022-12-15 DIAGNOSIS — Z1231 Encounter for screening mammogram for malignant neoplasm of breast: Secondary | ICD-10-CM | POA: Diagnosis not present

## 2023-01-10 ENCOUNTER — Ambulatory Visit: Payer: Medicare HMO | Admitting: Family Medicine

## 2023-01-11 ENCOUNTER — Encounter: Payer: Self-pay | Admitting: Family Medicine

## 2023-01-11 ENCOUNTER — Other Ambulatory Visit: Payer: Self-pay | Admitting: Family Medicine

## 2023-01-11 ENCOUNTER — Ambulatory Visit: Payer: Medicare HMO | Admitting: Family Medicine

## 2023-01-11 VITALS — BP 126/80 | HR 82 | Temp 98.3°F | Ht 63.75 in | Wt 173.5 lb

## 2023-01-11 DIAGNOSIS — I10 Essential (primary) hypertension: Secondary | ICD-10-CM

## 2023-01-11 DIAGNOSIS — H93A1 Pulsatile tinnitus, right ear: Secondary | ICD-10-CM

## 2023-01-11 NOTE — Telephone Encounter (Signed)
Pt had an acute appt today, last f/u was on 12/05/22  All Rxs were filled on 08/24/22 #90 tabs/ 1 refill

## 2023-01-11 NOTE — Assessment & Plan Note (Signed)
Going on for years at night/now pt notes during the day also  Reassuring exam - ears appear normal/ no carotid bruit and normal CV exam  Has chronic sinus issues and congestion /uses flonase daily  Also uses cpap   Discussed differential including ear /anatomic causes/ hearing loss and vascular causes In light of sinus issues will refer to ENT for prelim eval May want to consider imaging depending on result   Instructed to call if symptoms worsen in the meantime or if ear pain   Call back and Er precautions noted in detail today

## 2023-01-11 NOTE — Progress Notes (Signed)
Subjective:    Patient ID: Connie Rowe, female    DOB: 10-11-1950, 72 y.o.   MRN: [id number]  HPI  Wt Readings from Last 3 Encounters:  01/11/23 173 lb 8 oz (78.7 kg)  12/05/22 174 lb 4 oz (79 kg)  09/26/22 168 lb 8 oz (76.4 kg)   30.02 kg/m  Vitals:   01/11/23 1131 01/11/23 1154  BP: 134/76 126/80  Pulse: 82   Temp: 98.3 F (36.8 C)   SpO2: 95%     Pt presents for c/o hearing heart beat in her ear   Going on for long time  Now worse  Used to be worse at night Now all day and night   No high pitched ringing   It keeps her from sleeping at night   No ear pain   Right ear feels more dull  ? Pressure  Does not hear as well   No recent ear wax problems  A lot of ear infx as child   Does not use anything in her ears at all   Other ear is fine   Has sinus congestion a lot  Some facial pressure/ pain at times -not right now  Feels it in her teeth   Dentist sent her to dental sub specialist and problem was not dental   Over the counter Benadryl for allergies and sleep occational Also uses flonase daily    Mucous is clear right now   Occational headache- not very often   Time for eye exam      Patient Active Problem List   Diagnosis Date Noted   Pulsatile tinnitus of right ear 01/11/2023   Decreased GFR 09/26/2022   Current use of proton pump inhibitor 09/18/2022   Pain in joints of right hand 08/24/2022   Digital mucinous cyst of finger 08/24/2022   Seborrheic keratoses 08/24/2022   Screening mammogram for breast cancer 10/10/2021   Family hx of colon cancer 05/07/2020   Cervical disc disease 08/19/2019   Right hand paresthesia 08/18/2019   Right arm pain 08/18/2019   Right shoulder pain 08/18/2019   Dysphagia 10/14/2018   Allergic rhinitis 09/03/2017   Insomnia 11/27/2016   Daytime somnolence 05/23/2016   Snoring 05/23/2016   Estrogen deficiency 10/19/2015   Need for hepatitis C screening test 09/27/2015   S/P right THA, AA  03/30/2015   Pre-operative examination 02/24/2015   S/P lumbar laminectomy 09/23/2014   Essential hypertension 06/26/2014   Encounter for Medicare annual wellness exam 06/03/2014   Colon cancer screening 06/03/2014   Sciatica 11/26/2013   Prediabetes 04/17/2013   Hirsutism 12/06/2010   Routine general medical examination at a health care facility 09/10/2010   Other screening mammogram 09/01/2010   Osteopenia 10/22/2006   URINARY INCONTINENCE, STRESS 08/22/2006   History of Helicobacter pylori infection 04/18/2006   Hyperlipidemia 04/18/2006   Depression with anxiety 04/18/2006   External hemorrhoids 04/18/2006   GERD 04/18/2006   Diaphragmatic hernia 04/18/2006   OVERACTIVE BLADDER 04/18/2006   Hematuria 04/18/2006   History of digestive system disease 04/18/2006   Diverticulosis of colon 01/02/2000   Past Medical History:  Diagnosis Date   Allergy    Anxiety    Arthritis    Colon polyps    Depression    Diabetes mellitus without complication (HCC)    Esophageal stricture    Falls    Pt denies   GERD (gastroesophageal reflux disease)    H/O fracture of humerus 01/31/2004   History of bronchitis  History of hiatal hernia    Hyperlipidemia    Hypertension    Nocturia    Osteopenia    Pancreatitis    History of   Sleep apnea    uses CPAP   Wears glasses    reading glasses  04/06/2022   Past Surgical History:  Procedure Laterality Date   BLADDER REPAIR     COLONOSCOPY     CYSTOSCOPY W/ RETROGRADES Bilateral 04/11/2022   Procedure: CYSTOSCOPY WITH RETROGRADE PYELOGRAM;  Surgeon: Belva Agee, MD;  Location: [facility];  Service: Urology;  Laterality: Bilateral;   CYSTOSCOPY WITH BIOPSY N/A 04/11/2022   Procedure: CYSTOSCOPY WITH BIOPSY AND FULGERATION;  Surgeon: Belva Agee, MD;  Location: [facility];  Service: Urology;  Laterality: N/A;   DIAGNOSTIC LAPAROSCOPY     tubal ligation   DILATION AND CURETTAGE OF UTERUS      ESOPHAGEAL DILATION     JOINT REPLACEMENT     LUMBAR LAMINECTOMY/DECOMPRESSION MICRODISCECTOMY Right 09/23/2014   Procedure: Laminectomy and Foraminotomy - Lumbar three- lumbar four - right ;  Surgeon: Tia Alert, MD;  Location: [facility];  Service: Neurosurgery;  Laterality: Right;   MULTIPLE TOOTH EXTRACTIONS     TONSILLECTOMY     TOTAL HIP ARTHROPLASTY Right 03/30/2015   Procedure: RIGHT TOTAL HIP ARTHROPLASTY ANTERIOR APPROACH;  Surgeon: Durene Romans, MD;  Location: [facility];  Service: Orthopedics;  Laterality: Right;   TOTAL HIP ARTHROPLASTY Left 01/09/2017   Procedure: LEFT TOTAL HIP ARTHROPLASTY ANTERIOR APPROACH;  Surgeon: Durene Romans, MD;  Location: [facility];  Service: Orthopedics;  Laterality: Left;  70 mins   TUBAL LIGATION     UPPER GASTROINTESTINAL ENDOSCOPY     UPPER GI ENDOSCOPY     Social History   Tobacco Use   Smoking status: Never   Smokeless tobacco: Never  Vaping Use   Vaping status: Never Used  Substance Use Topics   Alcohol use: Yes    Comment: very rarely   Drug use: No   Family History  Problem Relation Age of Onset   Stroke Mother    Hypertension Mother    Colon cancer Mother    Stroke Brother    Drug abuse Son    Colon polyps Neg Hx    Crohn's disease Neg Hx    Pancreatic cancer Neg Hx    Rectal cancer Neg Hx    Stomach cancer Neg Hx    Ulcerative colitis Neg Hx    Breast cancer Neg Hx    Esophageal cancer Neg Hx    Allergies  Allergen Reactions   Ace Inhibitors Cough   Ambien [Zolpidem Tartrate] Other (See Comments)    Affected memory   Current Outpatient Medications on File Prior to Visit  Medication Sig Dispense Refill   amLODipine (NORVASC) 5 MG tablet Take 1 tablet (5 mg total) by mouth daily. 90 tablet 3   Calcium Carbonate-Vitamin D (CALCIUM-VITAMIN D) 500-200 MG-UNIT per tablet Take 1 tablet by mouth daily.     diphenhydrAMINE (BENADRYL) 25 mg capsule Take 25 mg by mouth every 6 (six) hours as needed.     FLUoxetine  (PROZAC) 40 MG capsule Take 1 capsule (40 mg total) by mouth daily. 90 capsule 1   fluticasone (FLONASE) 50 MCG/ACT nasal spray Place 2 sprays into both nostrils daily. 48 g 1   Multiple Vitamins-Minerals (MULTIVITAMIN PO) Take 1 tablet by mouth daily.     pantoprazole (PROTONIX) 40 MG tablet Take 1 tablet (  40 mg total) by mouth daily. 90 tablet 1   pravastatin (PRAVACHOL) 80 MG tablet Take 1 tablet (80 mg total) by mouth daily. 90 tablet 1   traZODone (DESYREL) 50 MG tablet TAKE 1/2 TO 1 TABLET AT BEDTIME AS NEEDED FOR SLEEP 90 tablet 1   No current facility-administered medications on file prior to visit.    Review of Systems  Constitutional:  Negative for activity change, appetite change, fatigue, fever and unexpected weight change.  HENT:  Positive for congestion, postnasal drip, rhinorrhea, sinus pressure and tinnitus. Negative for ear pain, sinus pain, sneezing, sore throat and trouble swallowing.   Eyes:  Negative for pain, redness and visual disturbance.  Respiratory:  Negative for cough, shortness of breath and wheezing.   Cardiovascular:  Negative for chest pain, palpitations and leg swelling.  Gastrointestinal:  Negative for abdominal pain, blood in stool, constipation and diarrhea.  Endocrine: Negative for polydipsia and polyuria.  Genitourinary:  Negative for dysuria, frequency and urgency.  Musculoskeletal:  Negative for arthralgias, back pain and myalgias.  Skin:  Negative for pallor and rash.  Allergic/Immunologic: Negative for environmental allergies.  Neurological:  Negative for dizziness, syncope, facial asymmetry, speech difficulty, weakness, light-headedness, numbness and headaches.       Occational tension headache Mild  Not often  Hematological:  Negative for adenopathy. Does not bruise/bleed easily.  Psychiatric/Behavioral:  Positive for sleep disturbance. Negative for decreased concentration and dysphoric mood. The patient is nervous/anxious.        Objective:    Physical Exam Constitutional:      General: She is not in acute distress.    Appearance: Normal appearance. She is well-developed. She is obese. She is not ill-appearing or diaphoretic.  HENT:     Head: Normocephalic and atraumatic.     Right Ear: Tympanic membrane, ear canal and external ear normal. There is no impacted cerumen.     Left Ear: Tympanic membrane, ear canal and external ear normal. There is no impacted cerumen.     Nose:     Comments: Right nostril is more narrow than left Some injection  Boggy nares     Mouth/Throat:     Mouth: Mucous membranes are moist.  Eyes:     General: No scleral icterus.       Right eye: No discharge.        Left eye: No discharge.     Conjunctiva/sclera: Conjunctivae normal.     Pupils: Pupils are equal, round, and reactive to light.  Neck:     Thyroid: No thyromegaly.     Vascular: No carotid bruit or JVD.  Cardiovascular:     Rate and Rhythm: Normal rate and regular rhythm.     Heart sounds: Normal heart sounds.     No gallop.  Pulmonary:     Effort: Pulmonary effort is normal. No respiratory distress.     Breath sounds: Normal breath sounds. No wheezing or rales.  Abdominal:     General: There is no distension or abdominal bruit.     Palpations: Abdomen is soft.  Musculoskeletal:     Cervical back: Normal range of motion and neck supple. No rigidity or tenderness.     Right lower leg: No edema.     Left lower leg: No edema.  Lymphadenopathy:     Cervical: No cervical adenopathy.  Skin:    General: Skin is warm and dry.     Coloration: Skin is not pale.     Findings: No rash.  Neurological:     Mental Status: She is alert.     Cranial Nerves: No cranial nerve deficit.     Motor: No weakness.     Coordination: Coordination normal.     Deep Tendon Reflexes: Reflexes are normal and symmetric. Reflexes normal.  Psychiatric:        Mood and Affect: Mood normal.           Assessment & Plan:   Problem List Items  Addressed This Visit       Cardiovascular and Mediastinum   Essential hypertension   bp in fair control at this time  BP Readings from Last 1 Encounters:  01/11/23 126/80  Now taking amlodipine 5 mg daily  No changes needed Most recent labs reviewed  Disc lifstyle change with low sodium diet and exercise           Nervous and Auditory   Pulsatile tinnitus of right ear - Primary   Going on for years at night/now pt notes during the day also  Reassuring exam - ears appear normal/ no carotid bruit and normal CV exam  Has chronic sinus issues and congestion /uses flonase daily  Also uses cpap   Discussed differential including ear /anatomic causes/ hearing loss and vascular causes In light of sinus issues will refer to ENT for prelim eval May want to consider imaging depending on result   Instructed to call if symptoms worsen in the meantime or if ear pain   Call back and Er precautions noted in detail today          Relevant Orders   Ambulatory referral to ENT

## 2023-01-11 NOTE — Patient Instructions (Signed)
If you have trouble sleeping-use white noise in the bedroom   If ear pain or any new symptoms let us know   I put the referral in for ENT  Please let us know if you don't hear in 1-2 weeks

## 2023-01-11 NOTE — Assessment & Plan Note (Signed)
bp in fair control at this time  BP Readings from Last 1 Encounters:  01/11/23 126/80  Now taking amlodipine 5 mg daily  No changes needed Most recent labs reviewed  Disc lifstyle change with low sodium diet and exercise

## 2023-01-12 ENCOUNTER — Other Ambulatory Visit: Payer: Self-pay | Admitting: Family Medicine

## 2023-01-26 ENCOUNTER — Encounter (INDEPENDENT_AMBULATORY_CARE_PROVIDER_SITE_OTHER): Payer: Self-pay | Admitting: Otolaryngology

## 2023-02-12 ENCOUNTER — Telehealth (INDEPENDENT_AMBULATORY_CARE_PROVIDER_SITE_OTHER): Payer: Self-pay | Admitting: Physician Assistant

## 2023-02-12 NOTE — Telephone Encounter (Signed)
 Called patient to reschedule 01/17 appt. Left Voicemail for return call.

## 2023-02-14 ENCOUNTER — Telehealth (INDEPENDENT_AMBULATORY_CARE_PROVIDER_SITE_OTHER): Payer: Self-pay | Admitting: Physician Assistant

## 2023-02-14 NOTE — Telephone Encounter (Signed)
 Called patient and left voicemail to reschedule appointment with Belma Boxer on 02/16/2023 due to him being out of office.

## 2023-02-16 ENCOUNTER — Institutional Professional Consult (permissible substitution) (INDEPENDENT_AMBULATORY_CARE_PROVIDER_SITE_OTHER): Payer: Medicare HMO

## 2023-02-26 ENCOUNTER — Other Ambulatory Visit: Payer: Self-pay | Admitting: Family Medicine

## 2023-02-26 DIAGNOSIS — N189 Chronic kidney disease, unspecified: Secondary | ICD-10-CM | POA: Diagnosis not present

## 2023-02-26 DIAGNOSIS — N179 Acute kidney failure, unspecified: Secondary | ICD-10-CM | POA: Diagnosis not present

## 2023-02-26 DIAGNOSIS — E785 Hyperlipidemia, unspecified: Secondary | ICD-10-CM | POA: Diagnosis not present

## 2023-02-26 DIAGNOSIS — N1832 Chronic kidney disease, stage 3b: Secondary | ICD-10-CM | POA: Diagnosis not present

## 2023-02-26 DIAGNOSIS — I129 Hypertensive chronic kidney disease with stage 1 through stage 4 chronic kidney disease, or unspecified chronic kidney disease: Secondary | ICD-10-CM | POA: Diagnosis not present

## 2023-02-26 DIAGNOSIS — R319 Hematuria, unspecified: Secondary | ICD-10-CM | POA: Diagnosis not present

## 2023-03-02 ENCOUNTER — Other Ambulatory Visit: Payer: Self-pay | Admitting: Nephrology

## 2023-03-02 DIAGNOSIS — R319 Hematuria, unspecified: Secondary | ICD-10-CM

## 2023-03-02 LAB — LAB REPORT - SCANNED
Albumin, Urine POC: 68.4
EGFR: 37
Microalb Creat Ratio: 32

## 2023-03-08 ENCOUNTER — Other Ambulatory Visit (HOSPITAL_COMMUNITY): Payer: Self-pay | Admitting: Nephrology

## 2023-03-08 DIAGNOSIS — N179 Acute kidney failure, unspecified: Secondary | ICD-10-CM

## 2023-03-22 ENCOUNTER — Ambulatory Visit (INDEPENDENT_AMBULATORY_CARE_PROVIDER_SITE_OTHER): Payer: Medicare HMO | Admitting: Audiology

## 2023-03-22 ENCOUNTER — Institutional Professional Consult (permissible substitution) (INDEPENDENT_AMBULATORY_CARE_PROVIDER_SITE_OTHER): Payer: Medicare HMO

## 2023-03-25 ENCOUNTER — Other Ambulatory Visit: Payer: Self-pay | Admitting: Family Medicine

## 2023-03-27 ENCOUNTER — Other Ambulatory Visit: Payer: Self-pay | Admitting: Student

## 2023-03-27 DIAGNOSIS — R3129 Other microscopic hematuria: Secondary | ICD-10-CM

## 2023-03-27 NOTE — H&P (Signed)
 Chief Complaint: Patient was seen in consultation today for hematuria; non-focal renal biopsy   Referring Physician(s): [name],[name]  Supervising Physician: Gilmer Mor  Patient Status: [facility] - Out-pt  History of Present Illness: Connie Rowe is a 73 y.o. female with a medical history significant for anxiety/depression, DM, HTN, pancreatitis and chronic kidney disease with longstanding history of hematuria. She has had hematuria for approximately 10 years and underwent evaluation by Urology last year. She had a cystogram with polyp removal. Her Nephrology team has requested a non-focal renal biopsy for further work up.    Past Medical History:  Diagnosis Date   Allergy    Anxiety    Arthritis    Colon polyps    Depression    Diabetes mellitus without complication (HCC)    Esophageal stricture    Falls    Pt denies   GERD (gastroesophageal reflux disease)    H/O fracture of humerus 01/31/2004   History of bronchitis    History of hiatal hernia    Hyperlipidemia    Hypertension    Nocturia    Osteopenia    Pancreatitis    History of   Sleep apnea    uses CPAP   Wears glasses    reading glasses  04/06/2022    Past Surgical History:  Procedure Laterality Date   BLADDER REPAIR     COLONOSCOPY     CYSTOSCOPY W/ RETROGRADES Bilateral 04/11/2022   Procedure: CYSTOSCOPY WITH RETROGRADE PYELOGRAM;  Surgeon: Belva Agee, MD;  Location: [facility];  Service: Urology;  Laterality: Bilateral;   CYSTOSCOPY WITH BIOPSY N/A 04/11/2022   Procedure: CYSTOSCOPY WITH BIOPSY AND FULGERATION;  Surgeon: Belva Agee, MD;  Location: [facility];  Service: Urology;  Laterality: N/A;   DIAGNOSTIC LAPAROSCOPY     tubal ligation   DILATION AND CURETTAGE OF UTERUS     ESOPHAGEAL DILATION     JOINT REPLACEMENT     LUMBAR LAMINECTOMY/DECOMPRESSION MICRODISCECTOMY Right 09/23/2014   Procedure: Laminectomy and Foraminotomy - Lumbar three-  lumbar four - right ;  Surgeon: Tia Alert, MD;  Location: [facility];  Service: Neurosurgery;  Laterality: Right;   MULTIPLE TOOTH EXTRACTIONS     TONSILLECTOMY     TOTAL HIP ARTHROPLASTY Right 03/30/2015   Procedure: RIGHT TOTAL HIP ARTHROPLASTY ANTERIOR APPROACH;  Surgeon: Durene Romans, MD;  Location: [facility];  Service: Orthopedics;  Laterality: Right;   TOTAL HIP ARTHROPLASTY Left 01/09/2017   Procedure: LEFT TOTAL HIP ARTHROPLASTY ANTERIOR APPROACH;  Surgeon: Durene Romans, MD;  Location: [facility];  Service: Orthopedics;  Laterality: Left;  70 mins   TUBAL LIGATION     UPPER GASTROINTESTINAL ENDOSCOPY     UPPER GI ENDOSCOPY      Allergies: Ace inhibitors and Ambien [zolpidem tartrate]  Medications: Prior to Admission medications   Medication Sig Start Date End Date Taking? Authorizing Provider  amLODipine (NORVASC) 5 MG tablet Take 1 tablet (5 mg total) by mouth daily. 12/05/22   Tower, Audrie Gallus, MD  Calcium Carbonate-Vitamin D (CALCIUM-VITAMIN D) 500-200 MG-UNIT per tablet Take 1 tablet by mouth daily.    [provider]  diphenhydrAMINE (BENADRYL) 25 mg capsule Take 25 mg by mouth every 6 (six) hours as needed.    [provider]  FLUoxetine (PROZAC) 40 MG capsule TAKE 1 CAPSULE EVERY DAY 01/11/23   Tower, Audrie Gallus, MD  fluticasone (FLONASE) 50 MCG/ACT nasal spray USE 2 SPRAYS IN EACH NOSTRIL EVERY DAY 03/26/23  Tower, Audrie Gallus, MD  hydrochlorothiazide (MICROZIDE) 12.5 MG capsule TAKE 1 CAPSULE EVERY DAY 01/11/23   Tower, Audrie Gallus, MD  Multiple Vitamins-Minerals (MULTIVITAMIN PO) Take 1 tablet by mouth daily.    [provider]  pantoprazole (PROTONIX) 40 MG tablet TAKE 1 TABLET EVERY DAY 02/26/23   Tower, Audrie Gallus, MD  pravastatin (PRAVACHOL) 80 MG tablet TAKE 1 TABLET EVERY DAY 01/11/23   Tower, Audrie Gallus, MD  traZODone (DESYREL) 50 MG tablet TAKE 1/2 TO 1 TABLET AT BEDTIME AS NEEDED FOR SLEEP 01/11/23   Tower, Audrie Gallus, MD     Family History  Problem  Relation Age of Onset   Stroke Mother    Hypertension Mother    Colon cancer Mother    Stroke Brother    Drug abuse Son    Colon polyps Neg Hx    Crohn's disease Neg Hx    Pancreatic cancer Neg Hx    Rectal cancer Neg Hx    Stomach cancer Neg Hx    Ulcerative colitis Neg Hx    Breast cancer Neg Hx    Esophageal cancer Neg Hx     Social History   Socioeconomic History   Marital status: Married    Spouse name: Not on file   Number of children: Not on file   Years of education: Not on file   Highest education level: 12th grade  Occupational History   Not on file  Tobacco Use   Smoking status: Never   Smokeless tobacco: Never  Vaping Use   Vaping status: Never Used  Substance and Sexual Activity   Alcohol use: Yes    Comment: very rarely   Drug use: No   Sexual activity: Yes  Other Topics Concern   Not on file  Social History Narrative   Not on file   Social Drivers of Health   Financial Resource Strain: Low Risk  (12/04/2022)   Overall Financial Resource Strain (CARDIA)    Difficulty of Paying Living Expenses: Not hard at all  Food Insecurity: No Food Insecurity (12/04/2022)   Hunger Vital Sign    Worried About Running Out of Food in the Last Year: Never true    Ran Out of Food in the Last Year: Never true  Transportation Needs: No Transportation Needs (12/04/2022)   PRAPARE - Administrator, Civil Service (Medical): No    Lack of Transportation (Non-Medical): No  Physical Activity: Insufficiently Active (12/04/2022)   Exercise Vital Sign    Days of Exercise per Week: 3 days    Minutes of Exercise per Session: 30 min  Stress: No Stress Concern Present (12/04/2022)   [employer] of Occupational Health - Occupational Stress Questionnaire    Feeling of Stress : Only a little  Social Connections: Moderately Isolated (12/04/2022)   Social Connection and Isolation Panel [NHANES]    Frequency of Communication with Friends and Family: More than three  times a week    Frequency of Social Gatherings with Friends and Family: Once a week    Attends Religious Services: Never    Database administrator or Organizations: No    Attends Banker Meetings: Never    Marital Status: Married    Review of Systems: A 12 point ROS discussed and pertinent positives are indicated in the HPI above.  All other systems are negative.  Review of Systems  Vital Signs: There were no vitals taken for this visit.  Physical Exam  Imaging: No results  found.  Labs:  CBC: Recent Labs    04/11/22 0744 08/24/22 1033 09/19/22 0832  WBC  --  5.7 5.1  HGB 12.6 12.7 12.8  HCT 37.0 40.4 39.8  PLT  --  250.0 234.0    COAGS: No results for input(s): "INR", "APTT" in the last 8760 hours.  BMP: Recent Labs    04/11/22 0744 09/19/22 0832 10/31/22 0811 12/05/22 1029  NA 141 140 139 140  K 3.6 3.8 3.8 3.7  CL 102 100 100 102  CO2  --  31 31 29   GLUCOSE 107* 121* 102* 101*  BUN 20 20 15 15   CALCIUM  --  9.1 9.8 9.5  CREATININE 1.80* 1.47* 1.46* 1.36*    LIVER FUNCTION TESTS: Recent Labs    09/19/22 0832  BILITOT 0.5  AST 16  ALT 14  ALKPHOS 56  PROT 6.7  ALBUMIN 4.1    TUMOR MARKERS: No results for input(s): "AFPTM", "CEA", "CA199", "CHROMGRNA" in the last 8760 hours.  Assessment and Plan:  Hematuria of unclear etiology: Connie Rowe, 73 year old female, presents today to the [facility] Interventional Radiology department for an image-guided non-focal renal biopsy.   Risks and benefits of this procedure were discussed with the patient and/or patient's family including, but not limited to bleeding, infection, damage to adjacent structures or low yield requiring additional tests.  All of the questions were answered and there is agreement to proceed. She has been NPO. She is a full code. She does not take any blood-thinning medications.   Consent signed and in chart.  Thank you for this interesting consult.  I greatly  enjoyed meeting Connie Rowe and look forward to participating in their care.  A copy of this report was sent to the requesting provider on this date.  Electronically Signed: Alwyn Ren, AGACNP-BC 03/27/2023, 9:58 AM   I spent a total of  30 Minutes   in face to face in clinical consultation, greater than 50% of which was counseling/coordinating care for non-focal renal biopsy.

## 2023-03-27 NOTE — H&P (Incomplete)
 Chief Complaint: Patient was seen in consultation today for acute kidney injury   at the request of ConnieRowe  Referring Physician(s): ConnieRowe  Supervising Physician: Gilmer Mor  Patient Status: [facility] - Out-pt  History of Present Illness: Connie Rowe is a 73 y.o. female with history of anxiety, depression, DM, GERD, hiatal hernia, HLD, HTN, osteopenia, sleep apnea-utilizes CPAP,   Past Medical History:  Diagnosis Date   Allergy    Anxiety    Arthritis    Colon polyps    Depression    Diabetes mellitus without complication (HCC)    Esophageal stricture    Falls    Pt denies   GERD (gastroesophageal reflux disease)    H/O fracture of humerus 01/31/2004   History of bronchitis    History of hiatal hernia    Hyperlipidemia    Hypertension    Nocturia    Osteopenia    Pancreatitis    History of   Sleep apnea    uses CPAP   Wears glasses    reading glasses  04/06/2022    Past Surgical History:  Procedure Laterality Date   BLADDER REPAIR     COLONOSCOPY     CYSTOSCOPY W/ RETROGRADES Bilateral 04/11/2022   Procedure: CYSTOSCOPY WITH RETROGRADE PYELOGRAM;  Surgeon: Belva Agee, MD;  Location: [facility];  Service: Urology;  Laterality: Bilateral;   CYSTOSCOPY WITH BIOPSY N/A 04/11/2022   Procedure: CYSTOSCOPY WITH BIOPSY AND FULGERATION;  Surgeon: Belva Agee, MD;  Location: [facility];  Service: Urology;  Laterality: N/A;   DIAGNOSTIC LAPAROSCOPY     tubal ligation   DILATION AND CURETTAGE OF UTERUS     ESOPHAGEAL DILATION     JOINT REPLACEMENT     LUMBAR LAMINECTOMY/DECOMPRESSION MICRODISCECTOMY Right 09/23/2014   Procedure: Laminectomy and Foraminotomy - Lumbar three- lumbar four - right ;  Surgeon: Tia Alert, MD;  Location: [facility];  Service: Neurosurgery;  Laterality: Right;   MULTIPLE TOOTH EXTRACTIONS     TONSILLECTOMY     TOTAL HIP ARTHROPLASTY Right 03/30/2015   Procedure: RIGHT  TOTAL HIP ARTHROPLASTY ANTERIOR APPROACH;  Surgeon: Durene Romans, MD;  Location: [facility];  Service: Orthopedics;  Laterality: Right;   TOTAL HIP ARTHROPLASTY Left 01/09/2017   Procedure: LEFT TOTAL HIP ARTHROPLASTY ANTERIOR APPROACH;  Surgeon: Durene Romans, MD;  Location: [facility];  Service: Orthopedics;  Laterality: Left;  70 mins   TUBAL LIGATION     UPPER GASTROINTESTINAL ENDOSCOPY     UPPER GI ENDOSCOPY      Allergies: Ace inhibitors and Ambien [zolpidem tartrate]  Medications: Prior to Admission medications   Medication Sig Start Date End Date Taking? Authorizing Provider  amLODipine (NORVASC) 5 MG tablet Take 1 tablet (5 mg total) by mouth daily. 12/05/22   Tower, Audrie Gallus, MD  Calcium Carbonate-Vitamin D (CALCIUM-VITAMIN D) 500-200 MG-UNIT per tablet Take 1 tablet by mouth daily.    [provider]  diphenhydrAMINE (BENADRYL) 25 mg capsule Take 25 mg by mouth every 6 (six) hours as needed.    [provider]  FLUoxetine (PROZAC) 40 MG capsule TAKE 1 CAPSULE EVERY DAY 01/11/23   Tower, Audrie Gallus, MD  fluticasone [facility]) 50 MCG/ACT nasal spray USE 2 SPRAYS IN EACH NOSTRIL EVERY DAY 03/26/23   Tower, Audrie Gallus, MD  hydrochlorothiazide (MICROZIDE) 12.5 MG capsule TAKE 1 CAPSULE EVERY DAY 01/11/23   Tower, Audrie Gallus, MD  Multiple Vitamins-Minerals (MULTIVITAMIN PO) Take 1 tablet by mouth daily.  [provider]  pantoprazole (PROTONIX) 40 MG tablet TAKE 1 TABLET EVERY DAY 02/26/23   Tower, Audrie Gallus, MD  pravastatin (PRAVACHOL) 80 MG tablet TAKE 1 TABLET EVERY DAY 01/11/23   Tower, Audrie Gallus, MD  traZODone (DESYREL) 50 MG tablet TAKE 1/2 TO 1 TABLET AT BEDTIME AS NEEDED FOR SLEEP 01/11/23   Tower, Audrie Gallus, MD     Family History  Problem Relation Age of Onset   Stroke Mother    Hypertension Mother    Colon cancer Mother    Stroke Brother    Drug abuse Son    Colon polyps Neg Hx    Crohn's disease Neg Hx    Pancreatic cancer Neg Hx    Rectal cancer Neg Hx     Stomach cancer Neg Hx    Ulcerative colitis Neg Hx    Breast cancer Neg Hx    Esophageal cancer Neg Hx     Social History   Socioeconomic History   Marital status: Married    Spouse name: Not on file   Number of children: Not on file   Years of education: Not on file   Highest education level: 12th grade  Occupational History   Not on file  Tobacco Use   Smoking status: Never   Smokeless tobacco: Never  Vaping Use   Vaping status: Never Used  Substance and Sexual Activity   Alcohol use: Yes    Comment: very rarely   Drug use: No   Sexual activity: Yes  Other Topics Concern   Not on file  Social History Narrative   Not on file   Social Drivers of Health   Financial Resource Strain: Low Risk  (12/04/2022)   Overall Financial Resource Strain (CARDIA)    Difficulty of Paying Living Expenses: Not hard at all  Food Insecurity: No Food Insecurity (12/04/2022)   Hunger Vital Sign    Worried About Running Out of Food in the Last Year: Never true    Ran Out of Food in the Last Year: Never true  Transportation Needs: No Transportation Needs (12/04/2022)   PRAPARE - Administrator, Civil Service (Medical): No    Lack of Transportation (Non-Medical): No  Physical Activity: Insufficiently Active (12/04/2022)   Exercise Vital Sign    Days of Exercise per Week: 3 days    Minutes of Exercise per Session: 30 min  Stress: No Stress Concern Present (12/04/2022)   [employer] of Occupational Health - Occupational Stress Questionnaire    Feeling of Stress : Only a little  Social Connections: Moderately Isolated (12/04/2022)   Social Connection and Isolation Panel [NHANES]    Frequency of Communication with Friends and Family: More than three times a week    Frequency of Social Gatherings with Friends and Family: Once a week    Attends Religious Services: Never    Database administrator or Organizations: No    Attends Banker Meetings: Never    Marital  Status: Married     Review of Systems: A 12 point ROS discussed and pertinent positives are indicated in the HPI above.  All other systems are negative.  Review of Systems  Vital Signs: There were no vitals taken for this visit.  Advance Care Plan: The advanced care plan/surrogate decision maker was discussed at the time of visit and documented in the medical record.    Physical Exam  Imaging: No results found.  Labs:  CBC: Recent Labs  04/11/22 0744 08/24/22 1033 09/19/22 0832  WBC  --  5.7 5.1  HGB 12.6 12.7 12.8  HCT 37.0 40.4 39.8  PLT  --  250.0 234.0    COAGS: No results for input(s): "INR", "APTT" in the last 8760 hours.  BMP: Recent Labs    04/11/22 0744 09/19/22 0832 10/31/22 0811 12/05/22 1029  NA 141 140 139 140  K 3.6 3.8 3.8 3.7  CL 102 100 100 102  CO2  --  31 31 29   GLUCOSE 107* 121* 102* 101*  BUN 20 20 15 15   CALCIUM  --  9.1 9.8 9.5  CREATININE 1.80* 1.47* 1.46* 1.36*    LIVER FUNCTION TESTS: Recent Labs    09/19/22 0832  BILITOT 0.5  AST 16  ALT 14  ALKPHOS 56  PROT 6.7  ALBUMIN 4.1    TUMOR MARKERS: No results for input(s): "AFPTM", "CEA", "CA199", "CHROMGRNA" in the last 8760 hours.  Assessment and Plan:  Risks and benefits of renal biopsy was discussed with the patient and/or patient's family including, but not limited to bleeding, infection, damage to adjacent structures or low yield requiring additional tests.  All of the questions were answered and there is agreement to proceed.  Consent signed and in chart.   Thank you for this interesting consult.  I greatly enjoyed meeting PRISCILLA KIRSTEIN and look forward to participating in their care.  A copy of this report was sent to the requesting provider on this date.  Electronically Signed: Rosalita Levan, PA 03/27/2023, 3:21 PM   I spent a total of  30 Minutes   in face to face in clinical consultation, greater than 50% of which was counseling/coordinating  care for acute kidney injury

## 2023-03-28 ENCOUNTER — Other Ambulatory Visit: Payer: Self-pay

## 2023-03-28 ENCOUNTER — Ambulatory Visit (HOSPITAL_COMMUNITY)
Admission: RE | Admit: 2023-03-28 | Discharge: 2023-03-28 | Disposition: A | Discharge status: Home / Self Care | Payer: Medicare HMO | Source: Ambulatory Visit | Attending: Nephrology | Admitting: Nephrology

## 2023-03-28 VITALS — BP 130/74 | HR 72 | Temp 98.6°F | Resp 15 | Ht 64.5 in | Wt 170.0 lb

## 2023-03-28 DIAGNOSIS — F32A Depression, unspecified: Secondary | ICD-10-CM | POA: Insufficient documentation

## 2023-03-28 DIAGNOSIS — I129 Hypertensive chronic kidney disease with stage 1 through stage 4 chronic kidney disease, or unspecified chronic kidney disease: Secondary | ICD-10-CM | POA: Insufficient documentation

## 2023-03-28 DIAGNOSIS — R944 Abnormal results of kidney function studies: Secondary | ICD-10-CM

## 2023-03-28 DIAGNOSIS — R319 Hematuria, unspecified: Secondary | ICD-10-CM | POA: Insufficient documentation

## 2023-03-28 DIAGNOSIS — E1122 Type 2 diabetes mellitus with diabetic chronic kidney disease: Secondary | ICD-10-CM | POA: Diagnosis not present

## 2023-03-28 DIAGNOSIS — N2889 Other specified disorders of kidney and ureter: Secondary | ICD-10-CM | POA: Diagnosis not present

## 2023-03-28 DIAGNOSIS — F419 Anxiety disorder, unspecified: Secondary | ICD-10-CM | POA: Insufficient documentation

## 2023-03-28 DIAGNOSIS — R3129 Other microscopic hematuria: Secondary | ICD-10-CM

## 2023-03-28 DIAGNOSIS — N189 Chronic kidney disease, unspecified: Secondary | ICD-10-CM | POA: Insufficient documentation

## 2023-03-28 DIAGNOSIS — Z8719 Personal history of other diseases of the digestive system: Secondary | ICD-10-CM | POA: Diagnosis not present

## 2023-03-28 DIAGNOSIS — N179 Acute kidney failure, unspecified: Secondary | ICD-10-CM

## 2023-03-28 LAB — CBC
HCT: 35 % — ABNORMAL LOW (ref 36.0–46.0)
Hemoglobin: 11.4 g/dL — ABNORMAL LOW (ref 12.0–15.0)
MCH: 30.2 pg (ref 26.0–34.0)
MCHC: 32.6 g/dL (ref 30.0–36.0)
MCV: 92.6 fL (ref 80.0–100.0)
Platelets: 217 10*3/uL (ref 150–400)
RBC: 3.78 MIL/uL — ABNORMAL LOW (ref 3.87–5.11)
RDW: 13.6 % (ref 11.5–15.5)
WBC: 5.4 10*3/uL (ref 4.0–10.5)
nRBC: 0 % (ref 0.0–0.2)

## 2023-03-28 LAB — PROTIME-INR
INR: 0.9 (ref 0.8–1.2)
Prothrombin Time: 12.5 s (ref 11.4–15.2)

## 2023-03-28 MED ORDER — FENTANYL CITRATE (PF) 100 MCG/2ML IJ SOLN
INTRAMUSCULAR | Status: AC | PRN
  Administered 2023-03-28: 50 ug via INTRAVENOUS

## 2023-03-28 MED ORDER — FENTANYL CITRATE (PF) 100 MCG/2ML IJ SOLN
INTRAMUSCULAR | Status: AC
  Filled 2023-03-28: qty 2

## 2023-03-28 MED ORDER — HYDRALAZINE HCL 20 MG/ML IJ SOLN
INTRAMUSCULAR | Status: AC
  Filled 2023-03-28: qty 1

## 2023-03-28 MED ORDER — MIDAZOLAM HCL 2 MG/2ML IJ SOLN
INTRAMUSCULAR | Status: AC
  Filled 2023-03-28: qty 2

## 2023-03-28 MED ORDER — HYDRALAZINE HCL 20 MG/ML IJ SOLN
10.0000 mg | Freq: Once | INTRAMUSCULAR | Status: AC
  Administered 2023-03-28: 10 mg via INTRAVENOUS

## 2023-03-28 MED ORDER — MIDAZOLAM HCL 2 MG/2ML IJ SOLN
INTRAMUSCULAR | Status: AC | PRN
  Administered 2023-03-28 (×2): .5 mg via INTRAVENOUS

## 2023-03-28 MED ORDER — HYDRALAZINE HCL 20 MG/ML IJ SOLN
INTRAMUSCULAR | Status: AC
Start: 2023-03-28 — End: ?
  Filled 2023-03-28: qty 1

## 2023-03-28 MED ORDER — LIDOCAINE HCL (PF) 1 % IJ SOLN
10.0000 mL | Freq: Once | INTRAMUSCULAR | Status: AC
  Administered 2023-03-28: 10 mL via INTRADERMAL

## 2023-03-28 NOTE — Procedures (Signed)
Interventional Radiology Procedure Note  Procedure: US guided biopsy of left kidney, medical renal Complications: None EBL: None Recommendations: - Bedrest 2 hours.   - Routine wound care - Follow up pathology - Advance diet   Signed,  Remedios Mckone, DO   

## 2023-03-29 ENCOUNTER — Telehealth (INDEPENDENT_AMBULATORY_CARE_PROVIDER_SITE_OTHER): Payer: Self-pay | Admitting: Physician Assistant

## 2023-03-29 NOTE — Telephone Encounter (Signed)
 Left vm to reschedule appt due to Burna Forts PA being in surgery. Offered patient to come in today 03/29/2023 betwwen 11:30am and 3pm.

## 2023-03-30 ENCOUNTER — Ambulatory Visit (INDEPENDENT_AMBULATORY_CARE_PROVIDER_SITE_OTHER): Payer: Medicare HMO | Admitting: Audiology

## 2023-03-30 ENCOUNTER — Institutional Professional Consult (permissible substitution) (INDEPENDENT_AMBULATORY_CARE_PROVIDER_SITE_OTHER): Payer: Medicare HMO

## 2023-04-02 ENCOUNTER — Ambulatory Visit (INDEPENDENT_AMBULATORY_CARE_PROVIDER_SITE_OTHER): Payer: Medicare HMO | Admitting: Audiology

## 2023-04-02 ENCOUNTER — Ambulatory Visit (INDEPENDENT_AMBULATORY_CARE_PROVIDER_SITE_OTHER): Payer: Medicare HMO | Admitting: Physician Assistant

## 2023-04-02 ENCOUNTER — Telehealth: Payer: Self-pay

## 2023-04-02 ENCOUNTER — Encounter (HOSPITAL_COMMUNITY): Payer: Self-pay

## 2023-04-02 DIAGNOSIS — H93A1 Pulsatile tinnitus, right ear: Secondary | ICD-10-CM

## 2023-04-02 DIAGNOSIS — H903 Sensorineural hearing loss, bilateral: Secondary | ICD-10-CM | POA: Diagnosis not present

## 2023-04-02 DIAGNOSIS — R7303 Prediabetes: Secondary | ICD-10-CM

## 2023-04-02 LAB — SURGICAL PATHOLOGY

## 2023-04-02 NOTE — Addendum Note (Signed)
 Addended by: Roxy Manns A on: 04/02/2023 08:19 PM   Modules accepted: Orders

## 2023-04-02 NOTE — Progress Notes (Signed)
  [address], [address] [place], [place] [address] [phone]  Audiological Evaluation    Name: Connie Rowe     DOB:   09-Apr-1950      MRN:   [id number]                                                                                     Service Date: 04/02/2023     Accompanied by: unaccompanied    Patient comes today after Eyvonne Mechanic, PA-C sent a referral for a hearing evaluation due to concerns with right sided heartbeat like sound in her right ear. .   Symptoms Yes Details  Hearing loss  [x]  Not really, sometimes feels like others mumble  Tinnitus  [x]  Heartbeat like sound in the right ear - used to be only perceived at night and now it is all the time.  Ear pain/ infections/pressure  []    Balance problems  []    Noise exposure history  []    Previous ear surgeries  []    Family history of hearing loss  []    Amplification  []    Other  []      Otoscopy: Right ear: Clear external ear canals and notable landmarks visualized on the tympanic membrane. Left ear:  Clear external ear canals and notable landmarks visualized on the tympanic membrane.  Tympanometry: Right ear: Type A- Normal external ear canal volume with normal middle ear pressure and tympanic membrane compliance. Left ear: Type Ad- Normal external ear canal volume with normal middle ear pressure and high tympanic membrane compliance.   Pure tone Audiometry: Both ears-  Normal hearing from [phone] Hz, them mild to moderate sensorineural hearing loss from 6000 Hz - 8000 Hz.    Speech Audiometry: Right ear- Speech Reception Threshold (SRT) was obtained at 15 dBHL. Left ear-Speech Reception Threshold (SRT) was obtained at 20 dBHL.   Word Recognition Score Tested using NU-6 (MLV) Right ear: 100% was obtained at a presentation level of 60 dBHL with contralateral masking which is deemed as  excellent. Left ear: 96% was obtained at a presentation level of 60 dBHL with contralateral masking which is deemed as   excellent.   The hearing test results were completed under headphones and results are deemed to be of good reliability. Test technique:  conventional    Impression: There is not a significant difference in pure-tone thresholds between ears., There is not a significant difference in the word recognition score in between ears.    Recommendations: Follow up with ENT as scheduled for today., Return for a hearing evaluation if concerns with hearing changes arise or per MD recommendation.   Arlissa Monteverde MARIE LEROUX-MARTINEZ, AUD

## 2023-04-02 NOTE — Patient Instructions (Signed)
 I have ordered an imaging study for you to complete prior to your next visit. Please call [facility] Scheduling at [phone] to schedule your imaging if you have not received a call within 24 hours. If you are unable to complete your imaging study prior to your next scheduled visit please call our office to let us know.

## 2023-04-02 NOTE — Telephone Encounter (Signed)
 The order is in

## 2023-04-02 NOTE — Telephone Encounter (Signed)
 Copied from CRM [id number]. Topic: Clinical - Request for Lab/Test Order >> Apr 02, 2023  3:46 PM Connie Rowe wrote: Reason for CRM: Patient is requesting lab orders to check her A1C.

## 2023-04-02 NOTE — Progress Notes (Unsigned)
 Dear Dr. Milinda Antis, Here is my assessment for our mutual patient, Connie Rowe. Thank you for allowing me the opportunity to care for your patient. Please do not hesitate to contact me should you have any other questions. Sincerely, Burna Forts PA-C  Otolaryngology Clinic Note Referring provider: Dr. Milinda Antis HPI:  Connie Rowe is a 73 y.o. female kindly referred by Dr. Milinda Antis   The patient is a 73 year old female presenting today with right sided pulsatile tinnitus.  She notes that approximately 1 year ago she started develop symptoms of pulsatile tinnitus in the right ear.  She describes this is a rhythmic heartbeat like sound in the right ear.  Uncertain if it is timed with her heartbeat but goes at the same pace.  She notes that originally it was intermittent and at night, now it seems to be there all day.  She cannot increase the volume of it with any movements or activity.  She denies any associated pain, numbness tingling or weakness or any neurologic deficits.  She denies any cardiovascular complaints including shortness of breath or chest pain.  She has no history of heart disease or vascular issues, no history of head or neck cancer no head or neck trauma.  She does not take aspirin.  She denies any dizziness.  Hearing is at baseline and normal.    Independent Review of Additional Tests or Records:  Pure tone Audiometry: Both ears-  Normal hearing from [phone] Hz, them mild to moderate sensorineural hearing loss from 6000 Hz - 8000 Hz.     Speech Audiometry: Right ear- Speech Reception Threshold (SRT) was obtained at 15 dBHL. Left ear-Speech Reception Threshold (SRT) was obtained at 20 dBHL.   Word Recognition Score Tested using NU-6 (MLV) Right ear: 100% was obtained at a presentation level of 60 dBHL with contralateral masking which is deemed as  excellent. Left ear: 96% was obtained at a presentation level of 60 dBHL with contralateral masking which is deemed as  excellent.   The  hearing test results were completed under headphones and results are deemed to be of good reliability. Test technique:  conventional     Impression: There is not a significant difference in pure-tone thresholds between ears., There is not a significant difference in the word recognition score in between ears     PMH/Meds/All/SocHx/FamHx/ROS:   Past Medical History:  Diagnosis Date   Allergy    Anxiety    Arthritis    Colon polyps    Depression    Diabetes mellitus without complication (HCC)    Esophageal stricture    Falls    Pt denies   GERD (gastroesophageal reflux disease)    H/O fracture of humerus 01/31/2004   History of bronchitis    History of hiatal hernia    Hyperlipidemia    Hypertension    Nocturia    Osteopenia    Pancreatitis    History of   Sleep apnea    uses CPAP   Wears glasses    reading glasses  04/06/2022     Past Surgical History:  Procedure Laterality Date   BLADDER REPAIR     COLONOSCOPY     CYSTOSCOPY W/ RETROGRADES Bilateral 04/11/2022   Procedure: CYSTOSCOPY WITH RETROGRADE PYELOGRAM;  Surgeon: Belva Agee, MD;  Location: [facility];  Service: Urology;  Laterality: Bilateral;   CYSTOSCOPY WITH BIOPSY N/A 04/11/2022   Procedure: CYSTOSCOPY WITH BIOPSY AND FULGERATION;  Surgeon: Belva Agee, MD;  Location: [facility];  Service: Urology;  Laterality: N/A;   DIAGNOSTIC LAPAROSCOPY     tubal ligation   DILATION AND CURETTAGE OF UTERUS     ESOPHAGEAL DILATION     JOINT REPLACEMENT     LUMBAR LAMINECTOMY/DECOMPRESSION MICRODISCECTOMY Right 09/23/2014   Procedure: Laminectomy and Foraminotomy - Lumbar three- lumbar four - right ;  Surgeon: Tia Alert, MD;  Location: [facility];  Service: Neurosurgery;  Laterality: Right;   MULTIPLE TOOTH EXTRACTIONS     TONSILLECTOMY     TOTAL HIP ARTHROPLASTY Right 03/30/2015   Procedure: RIGHT TOTAL HIP ARTHROPLASTY ANTERIOR APPROACH;  Surgeon: Durene Romans,  MD;  Location: [facility];  Service: Orthopedics;  Laterality: Right;   TOTAL HIP ARTHROPLASTY Left 01/09/2017   Procedure: LEFT TOTAL HIP ARTHROPLASTY ANTERIOR APPROACH;  Surgeon: Durene Romans, MD;  Location: [facility];  Service: Orthopedics;  Laterality: Left;  70 mins   TUBAL LIGATION     UPPER GASTROINTESTINAL ENDOSCOPY     UPPER GI ENDOSCOPY      Family History  Problem Relation Age of Onset   Stroke Mother    Hypertension Mother    Colon cancer Mother    Stroke Brother    Drug abuse Son    Colon polyps Neg Hx    Crohn's disease Neg Hx    Pancreatic cancer Neg Hx    Rectal cancer Neg Hx    Stomach cancer Neg Hx    Ulcerative colitis Neg Hx    Breast cancer Neg Hx    Esophageal cancer Neg Hx      Social Connections: Moderately Isolated (12/04/2022)   Social Connection and Isolation Panel [NHANES]    Frequency of Communication with Friends and Family: More than three times a week    Frequency of Social Gatherings with Friends and Family: Once a week    Attends Religious Services: Never    Database administrator or Organizations: No    Attends Banker Meetings: Never    Marital Status: Married      Current Outpatient Medications:    amLODipine (NORVASC) 5 MG tablet, Take 1 tablet (5 mg total) by mouth daily., Disp: 90 tablet, Rfl: 3   Calcium Carbonate-Vitamin D (CALCIUM-VITAMIN D) 500-200 MG-UNIT per tablet, Take 1 tablet by mouth daily., Disp: , Rfl:    diphenhydrAMINE (BENADRYL) 25 mg capsule, Take 25 mg by mouth every 6 (six) hours as needed., Disp: , Rfl:    FLUoxetine (PROZAC) 40 MG capsule, TAKE 1 CAPSULE EVERY DAY, Disp: 90 capsule, Rfl: 1   fluticasone (FLONASE) 50 MCG/ACT nasal spray, USE 2 SPRAYS IN EACH NOSTRIL EVERY DAY, Disp: 48 g, Rfl: 3   Multiple Vitamins-Minerals (MULTIVITAMIN PO), Take 1 tablet by mouth daily., Disp: , Rfl:    pantoprazole (PROTONIX) 40 MG tablet, TAKE 1 TABLET EVERY DAY, Disp: 90 tablet, Rfl: 3   pravastatin (PRAVACHOL) 80 MG  tablet, TAKE 1 TABLET EVERY DAY, Disp: 90 tablet, Rfl: 1   traZODone (DESYREL) 50 MG tablet, TAKE 1/2 TO 1 TABLET AT BEDTIME AS NEEDED FOR SLEEP, Disp: 90 tablet, Rfl: 1   Physical Exam:   There were no vitals taken for this visit.  Pertinent Findings  CN II-XII intact  Bilateral EAC clear and TM intact with well pneumatized middle ear spaces Weber 512: equal Rinne 512: AC > BC b/l  Anterior rhinoscopy: Septum midline; bilateral inferior turbinates with no hypertrophy No lesions of oral cavity/oropharynx; dentition wnl No obviously palpable neck masses/lymphadenopathy/thyromegaly, thrill noted  on the neck  No respiratory distress or stridor  Seprately Identifiable Procedures:  None  Impression & Plans:  Anaja Monts is a 73 y.o. female with the following   Pulsatile tinnitus-  The patient presents today with complaints of pulsatile tinnitus.  She has no associated neurologic deficits, this has been going on for the last year but has become constant.  Given the concerning findings I do recommend we proceed with head and neck imaging.  I have placed an order for MRI IAC as well as MR head and neck.  I would like to assess the vasculature of the head and neck to assure no significant stenosis, aneurysm, or any other concerning etiology.  The patient does note that she has some baseline kidney dysfunction.  She does have a nephrologist, I did recommend at her office visit with nephrology to verify if they feel MRI contrast is okay.  If the risks are unacceptable she will reach out to our office.  Once the patient has obtain the imaging I would like to see her back in the office for repeat evaluation.  If she develops any acute symptoms she should seek immediate care, she verbalized understanding and agreement to today's plan had no further questions or concerns   - f/u 2 weeks after MRI exam   Thank you for allowing me the opportunity to care for your patient. Please do not hesitate to  contact me should you have any other questions.  Sincerely, Burna Forts PA-C [facility] Phone: [phone] Fax: [phone]  04/02/2023, 9:30 AM

## 2023-04-03 ENCOUNTER — Encounter (INDEPENDENT_AMBULATORY_CARE_PROVIDER_SITE_OTHER): Payer: Self-pay

## 2023-04-03 NOTE — Telephone Encounter (Signed)
 [email or web address]

## 2023-04-03 NOTE — Telephone Encounter (Signed)
 Please call to get pt scheduled for lab

## 2023-04-04 ENCOUNTER — Encounter: Payer: Self-pay | Admitting: Family Medicine

## 2023-04-04 ENCOUNTER — Other Ambulatory Visit (INDEPENDENT_AMBULATORY_CARE_PROVIDER_SITE_OTHER)

## 2023-04-04 DIAGNOSIS — R7303 Prediabetes: Secondary | ICD-10-CM

## 2023-04-04 LAB — POCT GLYCOSYLATED HEMOGLOBIN (HGB A1C): Hemoglobin A1C: 6.1 % — AB (ref 4.0–5.6)

## 2023-04-05 ENCOUNTER — Ambulatory Visit (HOSPITAL_COMMUNITY)
Admission: RE | Admit: 2023-04-05 | Discharge: 2023-04-05 | Disposition: A | Discharge status: Home / Self Care | Source: Ambulatory Visit | Attending: Physician Assistant | Admitting: Physician Assistant

## 2023-04-05 DIAGNOSIS — H93A9 Pulsatile tinnitus, unspecified ear: Secondary | ICD-10-CM | POA: Diagnosis not present

## 2023-04-05 DIAGNOSIS — H93A1 Pulsatile tinnitus, right ear: Secondary | ICD-10-CM | POA: Insufficient documentation

## 2023-04-05 DIAGNOSIS — I6782 Cerebral ischemia: Secondary | ICD-10-CM | POA: Diagnosis not present

## 2023-04-05 MED ORDER — GADOBUTROL 1 MMOL/ML IV SOLN
8.0000 mL | Freq: Once | INTRAVENOUS | Status: AC | PRN
Start: 2023-04-05 — End: 2023-04-05
  Administered 2023-04-05: 8 mL via INTRAVENOUS

## 2023-04-06 ENCOUNTER — Encounter: Payer: Self-pay | Admitting: Audiology

## 2023-04-11 DIAGNOSIS — I129 Hypertensive chronic kidney disease with stage 1 through stage 4 chronic kidney disease, or unspecified chronic kidney disease: Secondary | ICD-10-CM | POA: Diagnosis not present

## 2023-04-11 DIAGNOSIS — R809 Proteinuria, unspecified: Secondary | ICD-10-CM | POA: Diagnosis not present

## 2023-04-11 DIAGNOSIS — N179 Acute kidney failure, unspecified: Secondary | ICD-10-CM | POA: Diagnosis not present

## 2023-04-11 DIAGNOSIS — R319 Hematuria, unspecified: Secondary | ICD-10-CM | POA: Diagnosis not present

## 2023-04-11 DIAGNOSIS — R799 Abnormal finding of blood chemistry, unspecified: Secondary | ICD-10-CM | POA: Diagnosis not present

## 2023-04-11 DIAGNOSIS — E785 Hyperlipidemia, unspecified: Secondary | ICD-10-CM | POA: Diagnosis not present

## 2023-04-11 DIAGNOSIS — N1831 Chronic kidney disease, stage 3a: Secondary | ICD-10-CM | POA: Diagnosis not present

## 2023-04-26 ENCOUNTER — Encounter (INDEPENDENT_AMBULATORY_CARE_PROVIDER_SITE_OTHER): Payer: Self-pay | Admitting: Physician Assistant

## 2023-04-26 ENCOUNTER — Ambulatory Visit (INDEPENDENT_AMBULATORY_CARE_PROVIDER_SITE_OTHER): Admitting: Physician Assistant

## 2023-04-26 DIAGNOSIS — H93A9 Pulsatile tinnitus, unspecified ear: Secondary | ICD-10-CM | POA: Diagnosis not present

## 2023-04-26 DIAGNOSIS — H93A1 Pulsatile tinnitus, right ear: Secondary | ICD-10-CM

## 2023-04-26 NOTE — Progress Notes (Signed)
 Dear Dr. Milinda Antis, Here is my assessment for our mutual patient, Connie Rowe. Thank you for allowing me the opportunity to care for your patient. Please do not hesitate to contact me should you have any other questions. Sincerely, Burna Forts PA-C  Otolaryngology Clinic Note Referring provider: Dr. Milinda Antis HPI:  Connie Rowe is a 73 y.o. female kindly referred by Dr. Milinda Antis   The patient is a 73 year old female seen in our office for results review.  The patient was last seen in the office on 04/02/2023 for pulsatile tinnitus of the right ear.  Below is a recap of that encounter  The patient is a 73 year old female presenting today with right sided pulsatile tinnitus.  She notes that approximately 1 year ago she started develop symptoms of pulsatile tinnitus in the right ear.  She describes this is a rhythmic heartbeat like sound in the right ear.  Uncertain if it is timed with her heartbeat but goes at the same pace.  She notes that originally it was intermittent and at night, now it seems to be there all day.  She cannot increase the volume of it with any movements or activity.  She denies any associated pain, numbness tingling or weakness or any neurologic deficits.  She denies any cardiovascular complaints including shortness of breath or chest pain.  She has no history of heart disease or vascular issues, no history of head or neck cancer no head or neck trauma.  She does not take aspirin.  She denies any dizziness.  Hearing is at baseline and normal.   Update 04/26/2023  Since her last office visit she denies any significant changes in her symptoms, she continues to endorse right sided pulsatile tinnitus with no associated neurologic symptoms.   Independent Review of Additional Tests or Records:  MR angio head and neck, MRI brain IAC on 04/05/2023 showing no significant abnormalities including stenosis, masses or vascular abnormalities   PMH/Meds/All/SocHx/FamHx/ROS:   Past Medical History:   Diagnosis Date   Allergy    Anxiety    Arthritis    Colon polyps    Depression    Diabetes mellitus without complication (HCC)    Esophageal stricture    Falls    Pt denies   GERD (gastroesophageal reflux disease)    H/O fracture of humerus 01/31/2004   History of bronchitis    History of hiatal hernia    Hyperlipidemia    Hypertension    Nocturia    Osteopenia    Pancreatitis    History of   Sleep apnea    uses CPAP   Wears glasses    reading glasses  04/06/2022     Past Surgical History:  Procedure Laterality Date   BLADDER REPAIR     COLONOSCOPY     CYSTOSCOPY W/ RETROGRADES Bilateral 04/11/2022   Procedure: CYSTOSCOPY WITH RETROGRADE PYELOGRAM;  Surgeon: Belva Agee, MD;  Location: [facility];  Service: Urology;  Laterality: Bilateral;   CYSTOSCOPY WITH BIOPSY N/A 04/11/2022   Procedure: CYSTOSCOPY WITH BIOPSY AND FULGERATION;  Surgeon: Belva Agee, MD;  Location: [facility];  Service: Urology;  Laterality: N/A;   DIAGNOSTIC LAPAROSCOPY     tubal ligation   DILATION AND CURETTAGE OF UTERUS     ESOPHAGEAL DILATION     JOINT REPLACEMENT     LUMBAR LAMINECTOMY/DECOMPRESSION MICRODISCECTOMY Right 09/23/2014   Procedure: Laminectomy and Foraminotomy - Lumbar three- lumbar four - right ;  Surgeon: Tia Alert, MD;  Location:  [facility];  Service: Neurosurgery;  Laterality: Right;   MULTIPLE TOOTH EXTRACTIONS     TONSILLECTOMY     TOTAL HIP ARTHROPLASTY Right 03/30/2015   Procedure: RIGHT TOTAL HIP ARTHROPLASTY ANTERIOR APPROACH;  Surgeon: Durene Romans, MD;  Location: [facility];  Service: Orthopedics;  Laterality: Right;   TOTAL HIP ARTHROPLASTY Left 01/09/2017   Procedure: LEFT TOTAL HIP ARTHROPLASTY ANTERIOR APPROACH;  Surgeon: Durene Romans, MD;  Location: [facility];  Service: Orthopedics;  Laterality: Left;  70 mins   TUBAL LIGATION     UPPER GASTROINTESTINAL ENDOSCOPY     UPPER GI ENDOSCOPY      Family History  Problem  Relation Age of Onset   Stroke Mother    Hypertension Mother    Colon cancer Mother    Stroke Brother    Drug abuse Son    Colon polyps Neg Hx    Crohn's disease Neg Hx    Pancreatic cancer Neg Hx    Rectal cancer Neg Hx    Stomach cancer Neg Hx    Ulcerative colitis Neg Hx    Breast cancer Neg Hx    Esophageal cancer Neg Hx      Social Connections: Moderately Isolated (12/04/2022)   Social Connection and Isolation Panel [NHANES]    Frequency of Communication with Friends and Family: More than three times a week    Frequency of Social Gatherings with Friends and Family: Once a week    Attends Religious Services: Never    Database administrator or Organizations: No    Attends Banker Meetings: Never    Marital Status: Married      Current Outpatient Medications:    amLODipine (NORVASC) 5 MG tablet, Take 1 tablet (5 mg total) by mouth daily., Disp: 90 tablet, Rfl: 3   Calcium Carbonate-Vitamin D (CALCIUM-VITAMIN D) 500-200 MG-UNIT per tablet, Take 1 tablet by mouth daily., Disp: , Rfl:    diphenhydrAMINE (BENADRYL) 25 mg capsule, Take 25 mg by mouth every 6 (six) hours as needed., Disp: , Rfl:    FLUoxetine (PROZAC) 40 MG capsule, TAKE 1 CAPSULE EVERY DAY, Disp: 90 capsule, Rfl: 1   fluticasone (FLONASE) 50 MCG/ACT nasal spray, USE 2 SPRAYS IN EACH NOSTRIL EVERY DAY, Disp: 48 g, Rfl: 3   Multiple Vitamins-Minerals (MULTIVITAMIN PO), Take 1 tablet by mouth daily., Disp: , Rfl:    pantoprazole (PROTONIX) 40 MG tablet, TAKE 1 TABLET EVERY DAY, Disp: 90 tablet, Rfl: 3   pravastatin (PRAVACHOL) 80 MG tablet, TAKE 1 TABLET EVERY DAY, Disp: 90 tablet, Rfl: 1   traZODone (DESYREL) 50 MG tablet, TAKE 1/2 TO 1 TABLET AT BEDTIME AS NEEDED FOR SLEEP, Disp: 90 tablet, Rfl: 1   Physical Exam:   There were no vitals taken for this visit.  Pertinent Findings  CN II-XII intact Resting comfortably in no acute distress Normal respirations Speaking full sentences No obvious neck  masses  Seprately Identifiable Procedures:  None  Impression & Plans:  Connie Rowe is a 73 y.o. female with the following   Pulsatile tinnitus-  The patient presents today for imaging review.  We discussed the findings of the MRIs which were fortunately without significant abnormality.  She has no obvious vascular source of her symptoms.  No masses were noted.  I have low suspicion for any life-threatening etiology at this time.  I did discuss the limitations of imaging studies and that if she develops any concerning signs or symptoms she would need to seek immediate  care.  I would like to see her back in the office if she develops any new symptoms or has any questions moving forward.  The patient verbalized understanding and agreement to today's plan and had no further questions or concerns.   - f/u PRN   Thank you for allowing me the opportunity to care for your patient. Please do not hesitate to contact me should you have any other questions.  Sincerely, Burna Forts PA-C [facility] Phone: [phone] Fax: [phone]  04/26/2023, 9:34 AM

## 2023-05-23 DIAGNOSIS — N1831 Chronic kidney disease, stage 3a: Secondary | ICD-10-CM | POA: Diagnosis not present

## 2023-06-04 ENCOUNTER — Ambulatory Visit (INDEPENDENT_AMBULATORY_CARE_PROVIDER_SITE_OTHER): Payer: Medicare HMO

## 2023-06-04 VITALS — BP 130/74 | Ht 64.5 in | Wt 168.0 lb

## 2023-06-04 DIAGNOSIS — Z0000 Encounter for general adult medical examination without abnormal findings: Secondary | ICD-10-CM | POA: Diagnosis not present

## 2023-06-04 NOTE — Patient Instructions (Signed)
 Connie Rowe , Thank you for taking time to come for your Medicare Wellness Visit. I appreciate your ongoing commitment to your health goals. Please review the following plan we discussed and let me know if I can assist you in the future.   Referrals/Orders/Follow-Ups/Clinician Recommendations: follow up as scheduled.  This is a list of the screening recommended for you and due dates:  Health Maintenance  Topic Date Due   DTaP/Tdap/Td vaccine (3 - Tdap) 12/05/2023*   COVID-19 Vaccine (3 - Pfizer risk series) 09/08/2024*   Flu Shot  08/31/2023   Hemoglobin A1C  10/05/2023   Mammogram  12/15/2023   Yearly kidney function blood test for diabetes  03/01/2024   Yearly kidney health urinalysis for diabetes  03/01/2024   Medicare Annual Wellness Visit  06/03/2024   Colon Cancer Screening  12/06/2025   Pneumonia Vaccine  Completed   DEXA scan (bone density measurement)  Completed   Hepatitis C Screening  Completed   Zoster (Shingles) Vaccine  Completed   HPV Vaccine  Aged Out   Meningitis B Vaccine  Aged Out   Complete foot exam   Discontinued   Eye exam for diabetics  Discontinued  *Topic was postponed. The date shown is not the original due date.    Advanced directives: (Copy Requested) Please bring a copy of your health care power of attorney and living will to the office to be added to your chart at your convenience. You can mail to [facility] [address] [place], [place] [address] or email to [email or web address]  Next Medicare Annual Wellness Visit scheduled for next year: Yes  Have you seen your provider in the last 6 months (3 months if uncontrolled diabetes)? Yes

## 2023-06-04 NOTE — Progress Notes (Signed)
 Because this visit was a virtual/telehealth visit,  certain criteria was not obtained, such a blood pressure, CBG if applicable, and timed get up and go. Any medications not marked as "taking" were not mentioned during the medication reconciliation part of the visit. Any vitals not documented were not able to be obtained due to this being a telehealth visit or patient was unable to self-report a recent blood pressure reading due to a lack of equipment at home via telehealth. Vitals that have been documented are verbally provided by the patient.  Subjective:   Connie Rowe is a 73 y.o. who presents for a Medicare Wellness preventive visit.  Visit Complete: Virtual I connected with  Lonn Roads on 06/04/23 by a audio enabled telemedicine application and verified that I am speaking with the correct person using two identifiers.  Patient Location: Home  Provider Location: Home Office  I discussed the limitations of evaluation and management by telemedicine. The patient expressed understanding and agreed to proceed.  Vital Signs: Because this visit was a virtual/telehealth visit, some criteria may be missing or patient reported. Any vitals not documented were not able to be obtained and vitals that have been documented are patient reported.  VideoDeclined- This patient declined Librarian, academic. Therefore the visit was completed with audio only.  Persons Participating in Visit: Patient.  AWV Questionnaire: No: Patient Medicare AWV questionnaire was not completed prior to this visit.  Cardiac Risk Factors include: advanced age (>78men, >67 women);hypertension;dyslipidemia     Objective:    Today's Vitals   06/04/23 1317  BP: 130/74  Weight: 168 lb (76.2 kg)  Height: 5' 4.5" (1.638 m)   Body mass index is 28.39 kg/m.     06/04/2023    1:15 PM 05/31/2022    3:17 PM 04/11/2022    7:14 AM 09/14/2021    8:08 PM 04/30/2020    9:02 AM 11/29/2017   11:40 AM  01/09/2017    4:30 PM  Advanced Directives  Does Patient Have a Medical Advance Directive? Yes Yes Yes No Yes No No  Type of Estate agent of [place];Living will Healthcare Power of [place];Living will Healthcare Power of [employer];Living will  Healthcare Power of [place];Living will    Does patient want to make changes to medical advance directive? No - Patient declined  No - Patient declined   Yes (MAU/Ambulatory/Procedural Areas - Information given)   Copy of Healthcare Power of Attorney in Chart? No - copy requested No - copy requested No - copy requested  No - copy requested    Would patient like information on creating a medical advance directive?    No - Patient declined   No - Patient declined    Current Medications (verified) Outpatient Encounter Medications as of 06/04/2023  Medication Sig   amLODipine  (NORVASC ) 10 MG tablet Take 10 mg by mouth daily.   Calcium Carbonate-Vitamin D  (CALCIUM-VITAMIN D ) 500-200 MG-UNIT per tablet Take 1 tablet by mouth daily.   diphenhydrAMINE  (BENADRYL ) 25 mg capsule Take 25 mg by mouth every 6 (six) hours as needed.   FLUoxetine  (PROZAC ) 40 MG capsule TAKE 1 CAPSULE EVERY DAY   fluticasone  (FLONASE ) 50 MCG/ACT nasal spray USE 2 SPRAYS IN EACH NOSTRIL EVERY DAY   JARDIANCE 25 MG TABS tablet Take 25 mg by mouth daily.   Multiple Vitamins-Minerals (MULTIVITAMIN PO) Take 1 tablet by mouth daily.   pantoprazole  (PROTONIX ) 40 MG tablet TAKE 1 TABLET EVERY DAY   pravastatin  (PRAVACHOL ) 80  MG tablet TAKE 1 TABLET EVERY DAY   traZODone  (DESYREL ) 50 MG tablet TAKE 1/2 TO 1 TABLET AT BEDTIME AS NEEDED FOR SLEEP   amLODipine  (NORVASC ) 5 MG tablet Take 1 tablet (5 mg total) by mouth daily. (Patient not taking: Reported on 06/04/2023)   No facility-administered encounter medications on file as of 06/04/2023.    Allergies (verified) Ace inhibitors and Ambien  [zolpidem  tartrate]   History: Past Medical History:  Diagnosis Date   Allergy     Anxiety    Arthritis    Colon polyps    Depression    Diabetes mellitus without complication (HCC)    Esophageal stricture    Falls    Pt denies   GERD (gastroesophageal reflux disease)    H/O fracture of humerus 01/31/2004   History of bronchitis    History of hiatal hernia    Hyperlipidemia    Hypertension    Nocturia    Osteopenia    Pancreatitis    History of   Sleep apnea    uses CPAP   Wears glasses    reading glasses  04/06/2022   Past Surgical History:  Procedure Laterality Date   BLADDER REPAIR     COLONOSCOPY     CYSTOSCOPY W/ RETROGRADES Bilateral 04/11/2022   Procedure: CYSTOSCOPY WITH RETROGRADE PYELOGRAM;  Surgeon: Sherlyn Ditto, MD;  Location: [facility];  Service: Urology;  Laterality: Bilateral;   CYSTOSCOPY WITH BIOPSY N/A 04/11/2022   Procedure: CYSTOSCOPY WITH BIOPSY AND FULGERATION;  Surgeon: Sherlyn Ditto, MD;  Location: [facility];  Service: Urology;  Laterality: N/A;   DIAGNOSTIC LAPAROSCOPY     tubal ligation   DILATION AND CURETTAGE OF UTERUS     ESOPHAGEAL DILATION     JOINT REPLACEMENT     LUMBAR LAMINECTOMY/DECOMPRESSION MICRODISCECTOMY Right 09/23/2014   Procedure: Laminectomy and Foraminotomy - Lumbar three- lumbar four - right ;  Surgeon: Isadora Mar, MD;  Location: [facility];  Service: Neurosurgery;  Laterality: Right;   MULTIPLE TOOTH EXTRACTIONS     TONSILLECTOMY     TOTAL HIP ARTHROPLASTY Right 03/30/2015   Procedure: RIGHT TOTAL HIP ARTHROPLASTY ANTERIOR APPROACH;  Surgeon: Claiborne Crew, MD;  Location: [facility];  Service: Orthopedics;  Laterality: Right;   TOTAL HIP ARTHROPLASTY Left 01/09/2017   Procedure: LEFT TOTAL HIP ARTHROPLASTY ANTERIOR APPROACH;  Surgeon: Claiborne Crew, MD;  Location: [facility];  Service: Orthopedics;  Laterality: Left;  70 mins   TUBAL LIGATION     UPPER GASTROINTESTINAL ENDOSCOPY     UPPER GI ENDOSCOPY     Family History  Problem Relation Age of Onset   Stroke  Mother    Hypertension Mother    Colon cancer Mother    Stroke Brother    Drug abuse Son    Colon polyps Neg Hx    Crohn's disease Neg Hx    Pancreatic cancer Neg Hx    Rectal cancer Neg Hx    Stomach cancer Neg Hx    Ulcerative colitis Neg Hx    Breast cancer Neg Hx    Esophageal cancer Neg Hx    Social History   Socioeconomic History   Marital status: Married    Spouse name: Not on file   Number of children: Not on file   Years of education: Not on file   Highest education level: 12th grade  Occupational History   Not on file  Tobacco Use   Smoking status: Never   Smokeless  tobacco: Never  Vaping Use   Vaping status: Never Used  Substance and Sexual Activity   Alcohol  use: Yes    Comment: very rarely   Drug use: No   Sexual activity: Yes  Other Topics Concern   Not on file  Social History Narrative   Not on file   Social Drivers of Health   Financial Resource Strain: Low Risk  (06/04/2023)   Overall Financial Resource Strain (CARDIA)    Difficulty of Paying Living Expenses: Not hard at all  Food Insecurity: No Food Insecurity (06/04/2023)   Hunger Vital Sign    Worried About Running Out of Food in the Last Year: Never true    Ran Out of Food in the Last Year: Never true  Transportation Needs: No Transportation Needs (06/04/2023)   PRAPARE - Administrator, Civil Service (Medical): No    Lack of Transportation (Non-Medical): No  Physical Activity: Insufficiently Active (06/04/2023)   Exercise Vital Sign    Days of Exercise per Week: 3 days    Minutes of Exercise per Session: 30 min  Stress: No Stress Concern Present (06/04/2023)   [employer] of Occupational Health - Occupational Stress Questionnaire    Feeling of Stress : Only a little  Social Connections: Moderately Isolated (06/04/2023)   Social Connection and Isolation Panel [NHANES]    Frequency of Communication with Friends and Family: More than three times a week    Frequency of Social  Gatherings with Friends and Family: Once a week    Attends Religious Services: Never    Database administrator or Organizations: No    Attends Engineer, structural: Never    Marital Status: Married    Tobacco Counseling Counseling given: Not Answered    Clinical Intake:  Pre-visit preparation completed: Yes  Pain : No/denies pain     BMI - recorded: 28.39 Nutritional Status: BMI 25 -29 Overweight Nutritional Risks: None Diabetes: No  Lab Results  Component Value Date   HGBA1C 6.1 (A) 04/04/2023   HGBA1C 6.2 09/19/2022   HGBA1C 6.3 10/10/2021     How often do you need to have someone help you when you read instructions, pamphlets, or other written materials from your doctor or pharmacy?: 1 - Never What is the last grade level you completed in school?: 12th grade  Interpreter Needed?: No  Information entered by :: [employer]   Activities of Daily Living     06/04/2023    1:22 PM  In your present state of health, do you have any difficulty performing the following activities:  Hearing? 0  Vision? 0  Difficulty concentrating or making decisions? 0  Walking or climbing stairs? 0  Dressing or bathing? 0  Doing errands, shopping? 0  Preparing Food and eating ? N  Using the Toilet? N  In the past six months, have you accidently leaked urine? Y  Do you have problems with loss of bowel control? N  Managing your Medications? N  Managing your Finances? N  Housekeeping or managing your Housekeeping? N    Patient Care Team: [place], Manley Seeds, MD as PCP - General (Family Medicine) [place], [facility] (Inactive) as Pharmacist (Pharmacist)  Indicate any recent Medical Services you may have received from other than [facility] providers in the past year (date may be approximate).     Assessment:   This is a routine wellness examination for [facility].  Hearing/Vision screen Hearing Screening - Comments:: Patient has no hearing issues  Vision Screening -  Comments:: Patient states she wears glasses    Goals Addressed             This Visit's Progress    Patient Stated       Try to stay active       Depression Screen     06/04/2023    1:24 PM 09/26/2022   10:28 AM 08/24/2022   11:01 AM 05/31/2022    3:15 PM 04/30/2020    9:14 AM 04/01/2019    9:49 AM 11/29/2017   11:41 AM  PHQ 2/9 Scores  PHQ - 2 Score 0 0 0 0 0 0 1  PHQ- 9 Score 0 6 3  0 5 1    Fall Risk     06/04/2023    1:21 PM 09/26/2022   10:28 AM 05/31/2022   10:51 AM 04/30/2020    9:14 AM 04/01/2019    8:57 AM  Fall Risk   Falls in the past year? 0 0 1 0 0  Number falls in past yr: 0 0 0 0   Injury with Fall? 0 0 1 0   Comment   tripped on stairs and broke toe    Risk for fall due to : No Fall Risks No Fall Risks  Medication side effect   Follow up Falls prevention discussed;Falls evaluation completed Falls evaluation completed Falls evaluation completed;Education provided;Falls prevention discussed Falls evaluation completed;Falls prevention discussed Falls evaluation completed    MEDICARE RISK AT HOME:  Medicare Risk at Home Any stairs in or around the home?: No If so, are there any without handrails?: No Home free of loose throw rugs in walkways, pet beds, electrical cords, etc?: Yes Adequate lighting in your home to reduce risk of falls?: Yes Life alert?: No Use of a cane, walker or w/c?: No Grab bars in the bathroom?: No Shower chair or bench in shower?: No Elevated toilet seat or a handicapped toilet?: No  TIMED UP AND GO:  Was the test performed?  No  Cognitive Function: 6CIT completed    04/30/2020    9:15 AM 11/29/2017   11:41 AM 11/15/2016    9:44 AM 10/15/2015    8:53 AM  MMSE - Mini Mental State Exam  Orientation to time 5 5 5 5   Orientation to Place 5 5 5 5   Registration 3 3 3 3   Attention/ Calculation 5 0 0 0  Recall 3 3 3 3   Language- name 2 objects  0 0 0  Language- repeat 1 1 1 1   Language- follow 3 step command  3 3 3   Language- read &  follow direction  0 0 0  Write a sentence  0 0 0  Copy design  0 0 0  Total score  20 20 20         06/04/2023    1:19 PM 05/31/2022    3:25 PM  6CIT Screen  What Year? 0 points 0 points  What month? 0 points 0 points  What time? 0 points 0 points  Count back from 20 0 points 0 points  Months in reverse 0 points 0 points  Repeat phrase 0 points 0 points  Total Score 0 points 0 points    Immunizations Immunization History  Administered Date(s) Administered   Fluad Quad(high Dose 65+) 12/31/2019, 01/04/2021, 10/10/2021   Fluad Trivalent(High Dose 65+) 09/26/2022   Influenza Split 11/17/2010, 12/14/2011   Influenza Whole 12/07/2006, 11/05/2007, 11/10/2008, 01/05/2010   Influenza,inj,Quad PF,6+ Mos 12/17/2012, 12/16/2013,  12/31/2014, 10/15/2015, 11/15/2016, 11/29/2017, 09/24/2018   PFIZER(Purple Top)SARS-COV-2 Vaccination 02/18/2019, 03/11/2019   Pneumococcal Conjugate-13 06/03/2014   Pneumococcal Polysaccharide-23 10/15/2015   Td 10/01/1995, 10/04/2006   Zoster Recombinant(Shingrix) 08/08/2022, 11/09/2022   Zoster, Live 09/29/2013    Screening Tests Health Maintenance  Topic Date Due   DTaP/Tdap/Td (3 - Tdap) 12/05/2023 (Originally 10/03/2016)   COVID-19 Vaccine (3 - Pfizer risk series) 09/08/2024 (Originally 04/08/2019)   INFLUENZA VACCINE  08/31/2023   HEMOGLOBIN A1C  10/05/2023   MAMMOGRAM  12/15/2023   Diabetic kidney evaluation - eGFR measurement  03/01/2024   Diabetic kidney evaluation - Urine ACR  03/01/2024   Medicare Annual Wellness (AWV)  06/03/2024   Colonoscopy  12/06/2025   Pneumonia Vaccine 95+ Years old  Completed   DEXA SCAN  Completed   Hepatitis C Screening  Completed   Zoster Vaccines- Shingrix  Completed   HPV VACCINES  Aged Out   Meningococcal B Vaccine  Aged Out   FOOT EXAM  Discontinued   OPHTHALMOLOGY EXAM  Discontinued    Health Maintenance  There are no preventive care reminders to display for this patient. Health Maintenance Items  Addressed:  Additional Screening:  Vision Screening: Recommended annual ophthalmology exams for early detection of glaucoma and other disorders of the eye.  Dental Screening: Recommended annual dental exams for proper oral hygiene  Community Resource Referral / Chronic Care Management: CRR required this visit?  No   CCM required this visit?  No     Plan:     I have personally reviewed and noted the following in the patient's chart:   Medical and social history Use of alcohol , tobacco or illicit drugs  Current medications and supplements including opioid prescriptions. Patient is not currently taking opioid prescriptions. Functional ability and status Nutritional status Physical activity Advanced directives List of other physicians Hospitalizations, surgeries, and ER visits in previous 12 months Vitals Screenings to include cognitive, depression, and falls Referrals and appointments  In addition, I have reviewed and discussed with patient certain preventive protocols, quality metrics, and best practice recommendations. A written personalized care plan for preventive services as well as general preventive health recommendations were provided to patient.     Freeda Jerry, [place]   06/04/2023   After Visit Summary: (MyChart) Due to this being a telephonic visit, the after visit summary with patients personalized plan was offered to patient via MyChart   Notes: Nothing significant to report at this time.

## 2023-06-07 ENCOUNTER — Other Ambulatory Visit: Payer: Self-pay | Admitting: Family Medicine

## 2023-06-07 NOTE — Telephone Encounter (Signed)
 To soon not due until 07/11/23

## 2023-08-25 ENCOUNTER — Other Ambulatory Visit: Payer: Self-pay | Admitting: Family Medicine

## 2023-08-28 DIAGNOSIS — I129 Hypertensive chronic kidney disease with stage 1 through stage 4 chronic kidney disease, or unspecified chronic kidney disease: Secondary | ICD-10-CM | POA: Diagnosis not present

## 2023-08-28 DIAGNOSIS — N1831 Chronic kidney disease, stage 3a: Secondary | ICD-10-CM | POA: Diagnosis not present

## 2023-08-28 DIAGNOSIS — E1122 Type 2 diabetes mellitus with diabetic chronic kidney disease: Secondary | ICD-10-CM | POA: Diagnosis not present

## 2023-08-28 DIAGNOSIS — R319 Hematuria, unspecified: Secondary | ICD-10-CM | POA: Diagnosis not present

## 2023-08-28 DIAGNOSIS — R140 Abdominal distension (gaseous): Secondary | ICD-10-CM | POA: Diagnosis not present

## 2023-08-28 DIAGNOSIS — E785 Hyperlipidemia, unspecified: Secondary | ICD-10-CM | POA: Diagnosis not present

## 2023-09-23 ENCOUNTER — Other Ambulatory Visit: Payer: Self-pay | Admitting: Family Medicine

## 2023-09-25 NOTE — Telephone Encounter (Signed)
 Pt is due for her CPE (labs prior) now. Please schedule   Med refilled once

## 2023-10-21 ENCOUNTER — Telehealth: Payer: Self-pay | Admitting: Family Medicine

## 2023-10-21 DIAGNOSIS — E7800 Pure hypercholesterolemia, unspecified: Secondary | ICD-10-CM

## 2023-10-21 DIAGNOSIS — R7303 Prediabetes: Secondary | ICD-10-CM

## 2023-10-21 DIAGNOSIS — I10 Essential (primary) hypertension: Secondary | ICD-10-CM

## 2023-10-21 DIAGNOSIS — Z79899 Other long term (current) drug therapy: Secondary | ICD-10-CM

## 2023-10-21 NOTE — Telephone Encounter (Signed)
-----   Message from Harlene Du sent at 10/11/2023  2:17 PM EDT ----- Regarding: Lab Wed 10/24/23 Hello,  Patient is coming in for CPE labs on Wednesday 10/24/23. Can we get orders please.   Thanks

## 2023-10-24 ENCOUNTER — Ambulatory Visit: Payer: Self-pay | Admitting: Family Medicine

## 2023-10-24 ENCOUNTER — Other Ambulatory Visit (INDEPENDENT_AMBULATORY_CARE_PROVIDER_SITE_OTHER)

## 2023-10-24 DIAGNOSIS — R7303 Prediabetes: Secondary | ICD-10-CM

## 2023-10-24 DIAGNOSIS — E7800 Pure hypercholesterolemia, unspecified: Secondary | ICD-10-CM

## 2023-10-24 DIAGNOSIS — Z79899 Other long term (current) drug therapy: Secondary | ICD-10-CM

## 2023-10-24 DIAGNOSIS — I10 Essential (primary) hypertension: Secondary | ICD-10-CM

## 2023-10-24 LAB — CBC WITH DIFFERENTIAL/PLATELET
Basophils Absolute: 0 K/uL (ref 0.0–0.1)
Basophils Relative: 0.7 % (ref 0.0–3.0)
Eosinophils Absolute: 0.3 K/uL (ref 0.0–0.7)
Eosinophils Relative: 6.1 % — ABNORMAL HIGH (ref 0.0–5.0)
HCT: 38.1 % (ref 36.0–46.0)
Hemoglobin: 12.3 g/dL (ref 12.0–15.0)
Lymphocytes Relative: 31.8 % (ref 12.0–46.0)
Lymphs Abs: 1.4 K/uL (ref 0.7–4.0)
MCHC: 32.3 g/dL (ref 30.0–36.0)
MCV: 89.6 fl (ref 78.0–100.0)
Monocytes Absolute: 0.5 K/uL (ref 0.1–1.0)
Monocytes Relative: 11.6 % (ref 3.0–12.0)
Neutro Abs: 2.2 K/uL (ref 1.4–7.7)
Neutrophils Relative %: 49.8 % (ref 43.0–77.0)
Platelets: 242 K/uL (ref 150.0–400.0)
RBC: 4.26 Mil/uL (ref 3.87–5.11)
RDW: 14 % (ref 11.5–15.5)
WBC: 4.4 K/uL (ref 4.0–10.5)

## 2023-10-24 LAB — COMPREHENSIVE METABOLIC PANEL WITH GFR
ALT: 10 U/L (ref 0–35)
AST: 13 U/L (ref 0–37)
Albumin: 4.2 g/dL (ref 3.5–5.2)
Alkaline Phosphatase: 81 U/L (ref 39–117)
BUN: 20 mg/dL (ref 6–23)
CO2: 29 meq/L (ref 19–32)
Calcium: 9.1 mg/dL (ref 8.4–10.5)
Chloride: 105 meq/L (ref 96–112)
Creatinine, Ser: 1.94 mg/dL — ABNORMAL HIGH (ref 0.40–1.20)
GFR: 24.93 mL/min — ABNORMAL LOW (ref >60.00)
Glucose, Bld: 183 mg/dL — ABNORMAL HIGH (ref 70–99)
Potassium: 4 meq/L (ref 3.5–5.1)
Sodium: 141 meq/L (ref 135–145)
Total Bilirubin: 0.4 mg/dL (ref 0.2–1.2)
Total Protein: 6.5 g/dL (ref 6.0–8.3)

## 2023-10-24 LAB — LIPID PANEL
Cholesterol: 160 mg/dL (ref 0–200)
HDL: 62 mg/dL (ref >39.00)
LDL Cholesterol: 65 mg/dL (ref 0–99)
NonHDL: 98.2
Total CHOL/HDL Ratio: 3
Triglycerides: 165 mg/dL — ABNORMAL HIGH (ref 0.0–149.0)
VLDL: 33 mg/dL (ref 0.0–40.0)

## 2023-10-24 LAB — TSH: TSH: 0.93 u[IU]/mL (ref 0.35–5.50)

## 2023-10-24 LAB — VITAMIN B12: Vitamin B-12: 270 pg/mL (ref 211–911)

## 2023-10-24 LAB — HEMOGLOBIN A1C: Hgb A1c MFr Bld: 6.4 % (ref 4.6–6.5)

## 2023-10-25 ENCOUNTER — Ambulatory Visit (INDEPENDENT_AMBULATORY_CARE_PROVIDER_SITE_OTHER): Admitting: Nurse Practitioner

## 2023-10-25 VITALS — BP 104/60 | HR 64 | Temp 98.2°F | Ht 64.5 in | Wt 159.4 lb

## 2023-10-25 DIAGNOSIS — R051 Acute cough: Secondary | ICD-10-CM | POA: Diagnosis not present

## 2023-10-25 DIAGNOSIS — J069 Acute upper respiratory infection, unspecified: Secondary | ICD-10-CM

## 2023-10-25 DIAGNOSIS — H938X3 Other specified disorders of ear, bilateral: Secondary | ICD-10-CM

## 2023-10-25 DIAGNOSIS — H6991 Unspecified Eustachian tube disorder, right ear: Secondary | ICD-10-CM | POA: Diagnosis not present

## 2023-10-25 LAB — POC COVID19 BINAXNOW: SARS Coronavirus 2 Ag: NEGATIVE

## 2023-10-25 MED ORDER — GUAIFENESIN-CODEINE 100-10 MG/5ML PO SOLN: 5.0000 mL | Freq: Three times a day (TID) | ORAL | 0 refills | Status: AC | PRN

## 2023-10-25 NOTE — Progress Notes (Signed)
 Acute Office Visit  Subjective:     Patient ID: Connie Rowe, female    DOB: [birth year], 73 y.o.   MRN: [id number]  Chief Complaint  Patient presents with   Cough    Pt complains of constant cough that started last weekend. States of feeling dizzy due to coughing. Pt also complains of congestion, itchy ears. No body aches, sore throat or fever. Been taking OTC zyrtec and muccinex     HPI  Discussed the use of AI scribe software for clinical note transcription with the patient, who gave verbal consent to proceed.  History of Present Illness Connie Rowe is a 73 year old female who presents with throat and ear symptoms following a recent beach trip.  Her symptoms began over the weekend with an itching sensation in her throat and ears, accompanied by a feeling of throat and tongue swelling. She attributes these symptoms to a possible allergic reaction from something encountered at the beach. The symptoms have progressively worsened, leading to difficulty sleeping due to a persistent dry cough. No fever or chills, but she experiences significant fatigue and dizziness, described as feeling 'swimmy headed'.  She has been using Zyrtec and Mucinex , which provide temporary relief, with Mucinex  being effective for the first few hours. No shortness of breath or wheezing, but her ears feel 'stopped up', affecting her hearing. She also experienced significant pressure and headache above her eyes yesterday, though it has since improved. No nausea, vomiting, diarrhea, chest pain outside of coughing, or body aches.  Her current medications include amlodipine  10 mg, fluoxetine , Flonase  as needed, Jardiance, Protonix , pravastatin , and trazodone . She uses Flonase  almost daily and has previously used codeine  cough syrups without issue.   Review of Systems  Constitutional:  Positive for malaise/fatigue. Negative for chills and fever.  HENT:  Positive for ear pain (stopped up) and sinus pain. Negative  for ear discharge and sore throat.   Respiratory:  Positive for cough. Negative for sputum production, shortness of breath and wheezing.   Cardiovascular:  Negative for chest pain.  Gastrointestinal:  Negative for abdominal pain, diarrhea, nausea and vomiting.  Musculoskeletal:  Negative for myalgias.  Neurological:  Positive for dizziness and headaches.        Objective:    BP 104/60   Pulse 64   Temp 98.2 F (36.8 C) (Oral)   Ht 5' 4.5 (1.638 m)   Wt 159 lb 6.4 oz (72.3 kg)   SpO2 97%   BMI 26.94 kg/m  BP Readings from Last 3 Encounters:  10/25/23 104/60  06/04/23 130/74  03/28/23 130/74   Wt Readings from Last 3 Encounters:  10/25/23 159 lb 6.4 oz (72.3 kg)  06/04/23 168 lb (76.2 kg)  03/28/23 170 lb (77.1 kg)   SpO2 Readings from Last 3 Encounters:  10/25/23 97%  03/28/23 95%  01/11/23 95%      Physical Exam Vitals and nursing note reviewed.  Constitutional:      Appearance: Normal appearance.  HENT:     Right Ear: Ear canal and external ear normal. Tympanic membrane is bulging. Tympanic membrane is not erythematous.     Left Ear: Tympanic membrane, ear canal and external ear normal. Tympanic membrane is not erythematous or bulging.     Nose:     Right Sinus: No maxillary sinus tenderness or frontal sinus tenderness.     Left Sinus: No maxillary sinus tenderness or frontal sinus tenderness.     Mouth/Throat:     Mouth: Mucous  membranes are moist.     Pharynx: Oropharynx is clear.  Cardiovascular:     Rate and Rhythm: Normal rate and regular rhythm.     Heart sounds: Normal heart sounds.  Pulmonary:     Effort: Pulmonary effort is normal.     Breath sounds: Normal breath sounds.  Lymphadenopathy:     Cervical: No cervical adenopathy.  Neurological:     Mental Status: She is alert.     No results found for any visits on 10/25/23.      Assessment & Plan:   Problem List Items Addressed This Visit   None Visit Diagnoses       Acute cough    -   Primary   Relevant Medications   guaiFENesin -codeine  100-10 MG/5ML syrup   Other Relevant Orders   POC COVID-19 BinaxNow     Sensation of fullness in both ears         Dysfunction of right eustachian tube         Upper respiratory tract infection, unspecified type          Assessment and Plan Assessment & Plan Acute upper respiratory infection with ETD Acute viral upper respiratory infection with ETD causing dizziness and ear fullness. COVID-19 negative. Symptoms expected to improve in 2-3 days. Discussed potential prednisone  use if no improvement, noting reversible effects on blood sugar and kidney function. - Prescribe codeine  cough syrup with Mucinex  for cough. - Advise continued use of Flonase  daily for ear fluid and congestion. - Instruct to monitor for fever or worsening symptoms and report if she occurs.  Type 2 diabetes mellitus, well controlled Type 2 diabetes mellitus well controlled with stable A1c levels over the past five years.  Meds ordered this encounter  Medications   guaiFENesin -codeine  100-10 MG/5ML syrup    Sig: Take 5 mLs by mouth 3 (three) times daily as needed.    Dispense:  120 mL    Refill:  0    Supervising Provider:   RANDEEN HARDY A [[id number]]    Return if symptoms worsen or fail to improve.  Adina Crandall, NP

## 2023-10-25 NOTE — Patient Instructions (Signed)
 Nice to see you today Covid test was negative in office I have sent in cough medication to the pharmacy. It has mucinex  in it so use one or the other.  You should start improving over the next few days If you develop a fever or do not start improving or get worse let me know

## 2023-10-31 ENCOUNTER — Encounter: Payer: Self-pay | Admitting: Family Medicine

## 2023-10-31 ENCOUNTER — Ambulatory Visit: Admitting: Family Medicine

## 2023-10-31 VITALS — BP 128/71 | HR 77 | Temp 97.8°F | Ht 63.5 in | Wt 159.5 lb

## 2023-10-31 DIAGNOSIS — M85859 Other specified disorders of bone density and structure, unspecified thigh: Secondary | ICD-10-CM | POA: Diagnosis not present

## 2023-10-31 DIAGNOSIS — E7800 Pure hypercholesterolemia, unspecified: Secondary | ICD-10-CM | POA: Diagnosis not present

## 2023-10-31 DIAGNOSIS — Z0000 Encounter for general adult medical examination without abnormal findings: Secondary | ICD-10-CM | POA: Diagnosis not present

## 2023-10-31 DIAGNOSIS — F418 Other specified anxiety disorders: Secondary | ICD-10-CM

## 2023-10-31 DIAGNOSIS — I10 Essential (primary) hypertension: Secondary | ICD-10-CM

## 2023-10-31 DIAGNOSIS — R7303 Prediabetes: Secondary | ICD-10-CM | POA: Diagnosis not present

## 2023-10-31 DIAGNOSIS — N184 Chronic kidney disease, stage 4 (severe): Secondary | ICD-10-CM | POA: Diagnosis not present

## 2023-10-31 DIAGNOSIS — R1032 Left lower quadrant pain: Secondary | ICD-10-CM | POA: Insufficient documentation

## 2023-10-31 DIAGNOSIS — Z79899 Other long term (current) drug therapy: Secondary | ICD-10-CM | POA: Diagnosis not present

## 2023-10-31 DIAGNOSIS — J0100 Acute maxillary sinusitis, unspecified: Secondary | ICD-10-CM

## 2023-10-31 DIAGNOSIS — Z1211 Encounter for screening for malignant neoplasm of colon: Secondary | ICD-10-CM

## 2023-10-31 MED ORDER — AMOXICILLIN-POT CLAVULANATE 875-125 MG PO TABS: 1.0000 | ORAL_TABLET | Freq: Two times a day (BID) | ORAL | 0 refills | Status: DC

## 2023-10-31 NOTE — Assessment & Plan Note (Addendum)
 Lab Results  Component Value Date   HGBA1C 6.4 10/24/2023   HGBA1C 6.1 (A) 04/04/2023   HGBA1C 6.2 09/19/2022   disc imp of low glycemic diet and wt loss to prevent DM2  On jardiance for ckd

## 2023-10-31 NOTE — Assessment & Plan Note (Signed)
 bp in fair control at this time  BP Readings from Last 1 Encounters:  10/31/23 128/71  Now taking amlodipine  5 mg daily  No changes needed Most recent labs reviewed  Disc lifstyle change with low sodium diet and exercise

## 2023-10-31 NOTE — Patient Instructions (Addendum)
 Wait until your respiratory illness is improved before flu shot    Continue pedaling  Add some strength training to your routine, this is important for bone and brain health and can reduce your risk of falls and help your body use insulin  properly and regulate weight  Light weights, exercise bands , and internet videos are a good way to start  Yoga (chair or regular), machines , floor exercises or a gym with machines are also good options   Stop the codeine  cough medicine  Take augmentin  for sinus infection   Keep drinking lots of water   Also eat produce   Start miralax  over the counter one dose as directed three times daily  When bowels start to move , you can decide how much you really need   Follow up in approx 1 week   If any symptoms worsen let us  know

## 2023-10-31 NOTE — Assessment & Plan Note (Signed)
 Last LDL good at 65  Disc goals for lipids and reasons to control them Rev last labs with pt Rev low sat fat diet in detail On pravastatin  80 mg daily

## 2023-10-31 NOTE — Assessment & Plan Note (Signed)
 PHQ of 2 Some situational stress   Continues fluoxetine  40 mg daily  Trazodone  for sleep

## 2023-10-31 NOTE — Assessment & Plan Note (Signed)
 Colonoscopy 11/2020  Mother with colon cancer at 68

## 2023-10-31 NOTE — Assessment & Plan Note (Signed)
 Last dexa was in normal range 04/2021  Reassuring  No new falls or fracture  Encouraged her to add strength building exercise   Discussed fall prevention, supplements and exercise for bone density

## 2023-10-31 NOTE — Assessment & Plan Note (Signed)
 Under nephrology care  GFR 24.9- up from 20 in march On jardiance 25 mg  Encouraged fluid intake

## 2023-10-31 NOTE — Progress Notes (Signed)
 Subjective:    Patient ID: Connie Rowe, female    DOB: 18-Oct-1950, 73 y.o.   MRN: [id number]  HPI  Here for health maintenance exam and to review chronic medical problems   Wt Readings from Last 3 Encounters:  10/31/23 159 lb 8 oz (72.3 kg)  10/25/23 159 lb 6.4 oz (72.3 kg)  06/04/23 168 lb (76.2 kg)   27.81 kg/m  Vitals:   10/31/23 1059 10/31/23 1130  BP: 134/82 128/71  Pulse: 77   Temp: 97.8 F (36.6 C)   SpO2: 96%     Immunization History  Administered Date(s) Administered   Fluad Quad(high Dose 65+) 12/31/2019, 01/04/2021, 10/10/2021   Fluad Trivalent(High Dose 65+) 09/26/2022   Influenza Split 11/17/2010, 12/14/2011   Influenza Whole 12/07/2006, 11/05/2007, 11/10/2008, 01/05/2010   Influenza,inj,Quad PF,6+ Mos 12/17/2012, 12/16/2013, 12/31/2014, 10/15/2015, 11/15/2016, 11/29/2017, 09/24/2018   PFIZER(Purple Top)SARS-COV-2 Vaccination 02/18/2019, 03/11/2019   Pneumococcal Conjugate-13 06/03/2014   Pneumococcal Polysaccharide-23 10/15/2015   Td 10/01/1995, 10/04/2006   Zoster Recombinant(Shingrix) 08/08/2022, 11/09/2022   Zoster, Live 09/29/2013    Health Maintenance Due  Topic Date Due   Mammogram  12/15/2023   Flu shot -is just getting over uri  Took mucinex  DM last night-worked better than codeine   Also bad sinus headache  Mucous is clear   M[facility] 12/20[facility] at [facility] Self breast exam- no lumps   Gyn health    Colon cancer screening  Colonoscopy 11/2020  Mother had colon cancer at 49    Bone health  Dexa  04/2021 normal bmd   (osteopenia prior)  Falls- none  Fractures-none  Supplements  Last vitamin D  Lab Results  Component Value Date   VD25OH 55.06 09/19/2022    Exercise  Uses a pedaler for cardio  Using arm weights    Mood    10/31/2023   11:05 AM 10/25/2023    8:28 AM 06/04/2023    1:24 PM 09/26/2022   10:28 AM 08/24/2022   11:01 AM  Depression screen PHQ 2/9  Decreased Interest 0 0 0 0 0  Down, Depressed, Hopeless 0  0  0 0  PHQ - 2 Score 0 0 0 0 0  Altered sleeping 1  0 2 3  Tired, decreased energy 1 1 0 1 0  Change in appetite 0 0 0 2 0  Feeling bad or failure about yourself  0 0 0 1 0  Trouble concentrating 1 1 0 0 0  Moving slowly or fidgety/restless 0 0 0 0 0  Suicidal thoughts 0 0 0 0 0  PHQ-9 Score 3  0 6 3  Difficult doing work/chores  Not difficult at all Not difficult at all Not difficult at all Not difficult at all   History of depression/anxiety  Fluoxetine  40 mg daily  Trazodone  for sleep   HTN bp is stable today  No cp or palpitations or headaches or edema  No side effects to medicines  BP Readings from Last 3 Encounters:  10/31/23 128/71  10/25/23 104/60  06/04/23 130/74    Amlodipine  5 mg daily   Ckd  Lab Results  Component Value Date   NA 141 10/24/2023   K 4.0 10/24/2023   CO2 29 10/24/2023   GLUCOSE 183 (H) 10/24/2023   BUN 20 10/24/2023   CREATININE 1.94 (H) 10/24/2023   CALCIUM 9.1 10/24/2023   GFR 24.93 (L) 10/24/2023   EGFR 37.0 03/02/2023   GFRNONAA 48 (L) 09/14/2021   GFR was 20 at nephrology in march On jardiance  now 25 mg    Lab Results  Component Value Date   WBC 4.4 10/24/2023   HGB 12.3 10/24/2023   HCT 38.1 10/24/2023   MCV 89.6 10/24/2023   PLT 242.0 10/24/2023    Hyperlipidemia Lab Results  Component Value Date   CHOL 160 10/24/2023   CHOL 205 (H) 12/05/2022   CHOL 195 10/31/2022   Lab Results  Component Value Date   HDL 62.00 10/24/2023   HDL 75.00 12/05/2022   HDL 72.00 10/31/2022   Lab Results  Component Value Date   LDLCALC 65 10/24/2023   LDLCALC 86 12/05/2022   LDLCALC 97 10/31/2022   Lab Results  Component Value Date   TRIG 165.0 (H) 10/24/2023   TRIG 220.0 (H) 12/05/2022   TRIG 128.0 10/31/2022   Lab Results  Component Value Date   CHOLHDL 3 10/24/2023   CHOLHDL 3 12/05/2022   CHOLHDL 3 10/31/2022   Lab Results  Component Value Date   LDLDIRECT 126.0 06/03/2014   LDLDIRECT 122.4 09/14/2010   LDLDIRECT  132.2 04/16/2008   Pravastatin  80 mg daily   Lab Results  Component Value Date   ALT 10 10/24/2023   AST 13 10/24/2023   ALKPHOS 81 10/24/2023   BILITOT 0.4 10/24/2023    Prediabetes Lab Results  Component Value Date   HGBA1C 6.4 10/24/2023   HGBA1C 6.1 (A) 04/04/2023   HGBA1C 6.2 09/19/2022   No steroids  Craves sweets and hard to control that    On ppi  Protonix  40 mg   Lab Results  Component Value Date   VITAMINB12 270 10/24/2023       Patient Active Problem List   Diagnosis Date Noted   LLQ pain 10/31/2023   Pulsatile tinnitus of right ear 01/11/2023   CKD (chronic kidney disease) stage 4, GFR 15-29 ml/min (HCC) 09/26/2022   Current use of proton pump inhibitor 09/18/2022   Pain in joints of right hand 08/24/2022   Digital mucinous cyst of finger 08/24/2022   Seborrheic keratoses 08/24/2022   Screening mammogram for breast cancer 10/10/2021   Family hx of colon cancer 05/07/2020   Cervical disc disease 08/19/2019   Dysphagia 10/14/2018   Allergic rhinitis 09/03/2017   Insomnia 11/27/2016   Daytime somnolence 05/23/2016   Snoring 05/23/2016   Estrogen deficiency 10/19/2015   Need for hepatitis C screening test 09/27/2015   S/P right THA, AA 03/30/2015   Pre-operative examination 02/24/2015   S/P lumbar laminectomy 09/23/2014   Essential hypertension 06/26/2014   Encounter for Medicare annual wellness exam 06/03/2014   Colon cancer screening 06/03/2014   Acute sinusitis 02/10/2014   Sciatica 11/26/2013   Prediabetes 04/17/2013   Hirsutism 12/06/2010   Routine general medical examination at a health care facility 09/10/2010   Other screening mammogram 09/01/2010   Osteopenia 10/22/2006   URINARY INCONTINENCE, STRESS 08/22/2006   History of Helicobacter pylori infection 04/18/2006   Hyperlipidemia 04/18/2006   Depression with anxiety 04/18/2006   External hemorrhoids 04/18/2006   GERD 04/18/2006   Diaphragmatic hernia 04/18/2006   OVERACTIVE  BLADDER 04/18/2006   Hematuria 04/18/2006   History of digestive system disease 04/18/2006   Diverticulosis of colon 01/02/2000   Past Medical History:  Diagnosis Date   Allergy    Anxiety    Arthritis    CKD (chronic kidney disease) stage 4, GFR 15-29 ml/min (HCC) 09/26/2022   Colon polyps    Depression    Diabetes mellitus without complication (HCC)    Esophageal stricture  Falls    Pt denies   GERD (gastroesophageal reflux disease)    H/O fracture of humerus 01/31/2004   History of bronchitis    History of hiatal hernia    Hyperlipidemia    Hypertension    Nocturia    Osteopenia    Pancreatitis    History of   Sleep apnea    uses CPAP   Wears glasses    reading glasses  04/06/2022   Past Surgical History:  Procedure Laterality Date   BLADDER REPAIR     COLONOSCOPY     CYSTOSCOPY W/ RETROGRADES Bilateral 04/11/2022   Procedure: CYSTOSCOPY WITH RETROGRADE PYELOGRAM;  Surgeon: Rosalind Zachary NOVAK, MD;  Location: [facility];  Service: Urology;  Laterality: Bilateral;   CYSTOSCOPY WITH BIOPSY N/A 04/11/2022   Procedure: CYSTOSCOPY WITH BIOPSY AND FULGERATION;  Surgeon: Rosalind Zachary NOVAK, MD;  Location: [facility];  Service: Urology;  Laterality: N/A;   DIAGNOSTIC LAPAROSCOPY     tubal ligation   DILATION AND CURETTAGE OF UTERUS     ESOPHAGEAL DILATION     JOINT REPLACEMENT     LUMBAR LAMINECTOMY/DECOMPRESSION MICRODISCECTOMY Right 09/23/2014   Procedure: Laminectomy and Foraminotomy - Lumbar three- lumbar four - right ;  Surgeon: Alm GORMAN Molt, MD;  Location: [facility];  Service: Neurosurgery;  Laterality: Right;   MULTIPLE TOOTH EXTRACTIONS     TONSILLECTOMY     TOTAL HIP ARTHROPLASTY Right 03/30/2015   Procedure: RIGHT TOTAL HIP ARTHROPLASTY ANTERIOR APPROACH;  Surgeon: Donnice Car, MD;  Location: [facility];  Service: Orthopedics;  Laterality: Right;   TOTAL HIP ARTHROPLASTY Left 01/09/2017   Procedure: LEFT TOTAL HIP ARTHROPLASTY  ANTERIOR APPROACH;  Surgeon: Car Donnice, MD;  Location: [facility];  Service: Orthopedics;  Laterality: Left;  70 mins   TUBAL LIGATION     UPPER GASTROINTESTINAL ENDOSCOPY     UPPER GI ENDOSCOPY     Social History   Tobacco Use   Smoking status: Never   Smokeless tobacco: Never  Vaping Use   Vaping status: Never Used  Substance Use Topics   Alcohol  use: Yes    Comment: very rarely   Drug use: No   Family History  Problem Relation Age of Onset   Stroke Mother    Hypertension Mother    Colon cancer Mother    Stroke Brother    Drug abuse Son    Colon polyps Neg Hx    Crohn's disease Neg Hx    Pancreatic cancer Neg Hx    Rectal cancer Neg Hx    Stomach cancer Neg Hx    Ulcerative colitis Neg Hx    Breast cancer Neg Hx    Esophageal cancer Neg Hx    Allergies  Allergen Reactions   Ace Inhibitors Cough   Ambien  [Zolpidem  Tartrate] Other (See Comments)    Affected memory   Current Outpatient Medications on File Prior to Visit  Medication Sig Dispense Refill   amLODipine  (NORVASC ) 10 MG tablet Take 10 mg by mouth daily.     Calcium Carbonate-Vitamin D  (CALCIUM-VITAMIN D ) 500-200 MG-UNIT per tablet Take 1 tablet by mouth daily.     diphenhydrAMINE  (BENADRYL ) 25 mg capsule Take 25 mg by mouth every 6 (six) hours as needed.     FLUoxetine  (PROZAC ) 40 MG capsule TAKE 1 CAPSULE EVERY DAY 90 capsule 0   fluticasone  (FLONASE ) 50 MCG/ACT nasal spray USE 2 SPRAYS IN EACH NOSTRIL EVERY DAY 48 g 3   guaiFENesin -codeine  100-10  MG/5ML syrup Take 5 mLs by mouth 3 (three) times daily as needed. 120 mL 0   JARDIANCE 25 MG TABS tablet Take 25 mg by mouth daily.     Multiple Vitamins-Minerals (MULTIVITAMIN PO) Take 1 tablet by mouth daily.     pantoprazole  (PROTONIX ) 40 MG tablet TAKE 1 TABLET EVERY DAY 90 tablet 3   pravastatin  (PRAVACHOL ) 80 MG tablet TAKE 1 TABLET EVERY DAY 90 tablet 0   traZODone  (DESYREL ) 50 MG tablet TAKE 1/2 TO 1 TABLET AT BEDTIME AS NEEDED FOR SLEEP 90 tablet 1    No current facility-administered medications on file prior to visit.    Review of Systems  Constitutional:  Negative for activity change, appetite change, fatigue, fever and unexpected weight change.  HENT:  Positive for postnasal drip, sinus pressure and sinus pain. Negative for congestion, ear pain, nosebleeds, rhinorrhea and sore throat.   Eyes:  Negative for pain, redness, itching and visual disturbance.  Respiratory:  Positive for cough. Negative for shortness of breath and wheezing.   Cardiovascular:  Negative for chest pain and palpitations.  Gastrointestinal:  Positive for abdominal pain and constipation. Negative for blood in stool, diarrhea, nausea and vomiting.  Endocrine: Negative for polydipsia and polyuria.  Genitourinary:  Negative for dysuria, frequency and urgency.  Musculoskeletal:  Negative for arthralgias, back pain and myalgias.  Skin:  Negative for pallor and rash.  Allergic/Immunologic: Negative for environmental allergies and immunocompromised state.  Neurological:  Negative for dizziness, tremors, syncope, weakness, numbness and headaches.  Hematological:  Negative for adenopathy. Does not bruise/bleed easily.  Psychiatric/Behavioral:  Negative for decreased concentration and dysphoric mood. The patient is not nervous/anxious.        Objective:   Physical Exam Constitutional:      General: She is not in acute distress.    Appearance: Normal appearance. She is well-developed and normal weight. She is not ill-appearing.  HENT:     Head: Normocephalic and atraumatic.     Comments: Bilateral maxillary and frontal sinus tenderness    Right Ear: Tympanic membrane and external ear normal.     Left Ear: Tympanic membrane and external ear normal.     Nose: Congestion and rhinorrhea present.     Mouth/Throat:     Pharynx: Oropharynx is clear. No oropharyngeal exudate or posterior oropharyngeal erythema.     Comments: Clear pnd Eyes:     General:        Right  eye: No discharge.        Left eye: No discharge.     Conjunctiva/sclera: Conjunctivae normal.     Pupils: Pupils are equal, round, and reactive to light.  Cardiovascular:     Rate and Rhythm: Normal rate and regular rhythm.  Pulmonary:     Effort: Pulmonary effort is normal. No respiratory distress.     Breath sounds: Normal breath sounds. No wheezing or rales.     Comments: Good air exch No rales or rhonchi Abdominal:     General: Abdomen is protuberant. Bowel sounds are normal. There is no distension or abdominal bruit. There are no signs of injury.     Palpations: There is no hepatomegaly, splenomegaly, mass or pulsatile mass.     Tenderness: There is abdominal tenderness in the left lower quadrant. There is no right CVA tenderness or left CVA tenderness. Negative signs include Murphy's sign and McBurney's sign.     Hernia: No hernia is present.     Comments: Mild LLQ pain   Musculoskeletal:  Cervical back: Normal range of motion and neck supple.  Lymphadenopathy:     Cervical: No cervical adenopathy.  Skin:    General: Skin is warm and dry.     Findings: No rash.  Neurological:     Mental Status: She is alert.     Cranial Nerves: No cranial nerve deficit.     Coordination: Coordination normal.     Deep Tendon Reflexes: Reflexes normal.  Psychiatric:        Mood and Affect: Mood normal.           Assessment & Plan:   Problem List Items Addressed This Visit       Cardiovascular and Mediastinum   Essential hypertension   bp in fair control at this time  BP Readings from Last 1 Encounters:  10/31/23 128/71  Now taking amlodipine  5 mg daily  No changes needed Most recent labs reviewed  Disc lifstyle change with low sodium diet and exercise           Respiratory   Acute sinusitis   S/p uri with congestion Worsening sinus pain and pressure Tenderness on exam  Persistent cough   Prescription augmentin  Follow up 1 wk for re check   Call back and Er  precautions noted in detail today          Relevant Medications   amoxicillin -clavulanate (AUGMENTIN ) 875-125 MG tablet     Musculoskeletal and Integument   Osteopenia   Last dexa was in normal range 04/2021  Reassuring  No new falls or fracture  Encouraged her to add strength building exercise   Discussed fall prevention, supplements and exercise for bone density          Genitourinary   CKD (chronic kidney disease) stage 4, GFR 15-29 ml/min (HCC)   Under nephrology care  GFR 24.9- up from 20 in march On jardiance 25 mg  Encouraged fluid intake         Other   Routine general medical examination at a health care facility - Primary   Reviewed health habits including diet and exercise and skin cancer prevention Reviewed appropriate screening tests for age  Also reviewed health mt list, fam hx and immunization status , as well as social and family history   See HPI Labs reviewed and ordered Health Maintenance  Topic Date Due   Flu Shot  08/31/2023   Breast Cancer Screening  12/15/2023   DTaP/Tdap/Td vaccine (3 - Tdap) 12/05/2023*   COVID-19 Vaccine (3 - Pfizer risk series) 09/08/2024*   Yearly kidney health urinalysis for diabetes  03/01/2024   Hemoglobin A1C  04/22/2024   Medicare Annual Wellness Visit  06/03/2024   Yearly kidney function blood test for diabetes  10/23/2024   Colon Cancer Screening  12/06/2025   Pneumococcal Vaccine for age over 56  Completed   DEXA scan (bone density measurement)  Completed   Hepatitis C Screening  Completed   Zoster (Shingles) Vaccine  Completed   HPV Vaccine  Aged Out   Meningitis B Vaccine  Aged Out   Complete foot exam   Discontinued   Eye exam for diabetics  Discontinued  *Topic was postponed. The date shown is not the original due date.    Waiting on flu shot due to current uri  Discussed fall prevention, supplements and exercise for bone density   No falls or fractures PHQ 2 with treatment        Prediabetes    Lab Results  Component Value Date  HGBA1C 6.4 10/24/2023   HGBA1C 6.1 (A) 04/04/2023   HGBA1C 6.2 09/19/2022   disc imp of low glycemic diet and wt loss to prevent DM2  On jardiance for ckd       LLQ pain   For several months In setting of constipation worsened after taking codeine  cough medicine  Last colonsocopy 11/2020- reassuring (does have diverticulosis)  Mother had colon cancer at 31   Some tenderness on exam , no rebound or acute  Wbc normal on last /recent cbc (was having pain at the time)   Will treat constipation with miralax - TID to start until bowels start to move then titrate to need  Follow up 1 week Low threshold to image   Call back and Er precautions noted in detail today          Hyperlipidemia   Last LDL good at 63  Disc goals for lipids and reasons to control them Rev last labs with pt Rev low sat fat diet in detail On pravastatin  80 mg daily         Depression with anxiety   PHQ of 2 Some situational stress   Continues fluoxetine  40 mg daily  Trazodone  for sleep       Current use of proton pump inhibitor   Lab Results  Component Value Date   VITAMINB12 270 10/24/2023   Last vitamin D  Lab Results  Component Value Date   VD25OH 55.06 09/19/2022   Following       Colon cancer screening   Colonoscopy 11/2020  Mother with colon cancer at 42

## 2023-10-31 NOTE — Assessment & Plan Note (Signed)
 Lab Results  Component Value Date   VITAMINB12 270 10/24/2023   Last vitamin D  Lab Results  Component Value Date   VD25OH 55.06 09/19/2022   Following

## 2023-10-31 NOTE — Assessment & Plan Note (Signed)
 For several months In setting of constipation worsened after taking codeine  cough medicine  Last colonsocopy 11/2020- reassuring (does have diverticulosis)  Mother had colon cancer at 31   Some tenderness on exam , no rebound or acute  Wbc normal on last /recent cbc (was having pain at the time)   Will treat constipation with miralax - TID to start until bowels start to move then titrate to need  Follow up 1 week Low threshold to image   Call back and Er precautions noted in detail today

## 2023-10-31 NOTE — Assessment & Plan Note (Signed)
 S/p uri with congestion Worsening sinus pain and pressure Tenderness on exam  Persistent cough   Prescription augmentin  Follow up 1 wk for re check   Call back and Er precautions noted in detail today

## 2023-10-31 NOTE — Assessment & Plan Note (Signed)
 Reviewed health habits including diet and exercise and skin cancer prevention Reviewed appropriate screening tests for age  Also reviewed health mt list, fam hx and immunization status , as well as social and family history   See HPI Labs reviewed and ordered Health Maintenance  Topic Date Due   Flu Shot  08/31/2023   Breast Cancer Screening  12/15/2023   DTaP/Tdap/Td vaccine (3 - Tdap) 12/05/2023*   COVID-19 Vaccine (3 - Pfizer risk series) 09/08/2024*   Yearly kidney health urinalysis for diabetes  03/01/2024   Hemoglobin A1C  04/22/2024   Medicare Annual Wellness Visit  06/03/2024   Yearly kidney function blood test for diabetes  10/23/2024   Colon Cancer Screening  12/06/2025   Pneumococcal Vaccine for age over 81  Completed   DEXA scan (bone density measurement)  Completed   Hepatitis C Screening  Completed   Zoster (Shingles) Vaccine  Completed   HPV Vaccine  Aged Out   Meningitis B Vaccine  Aged Out   Complete foot exam   Discontinued   Eye exam for diabetics  Discontinued  *Topic was postponed. The date shown is not the original due date.    Waiting on flu shot due to current uri  Discussed fall prevention, supplements and exercise for bone density   No falls or fractures PHQ 2 with treatment

## 2023-11-07 ENCOUNTER — Encounter: Payer: Self-pay | Admitting: Family Medicine

## 2023-11-07 ENCOUNTER — Ambulatory Visit (INDEPENDENT_AMBULATORY_CARE_PROVIDER_SITE_OTHER): Admitting: Family Medicine

## 2023-11-07 VITALS — BP 122/76 | HR 69 | Temp 98.1°F | Ht 63.5 in | Wt 157.5 lb

## 2023-11-07 DIAGNOSIS — K5730 Diverticulosis of large intestine without perforation or abscess without bleeding: Secondary | ICD-10-CM

## 2023-11-07 DIAGNOSIS — R1032 Left lower quadrant pain: Secondary | ICD-10-CM

## 2023-11-07 MED ORDER — AMOXICILLIN-POT CLAVULANATE 875-125 MG PO TABS: 1.0000 | ORAL_TABLET | Freq: Two times a day (BID) | ORAL | 0 refills | Status: AC

## 2023-11-07 NOTE — Patient Instructions (Addendum)
 I am concerned about possible diverticulitis   Avoid nuts/seeds/corn and popcorn (or anything else that bothers your GI system)   Start augmentin  (antibiotic) with food twice daily   I need to review your nephrology note -then plan to order a CT with contrast if safe to do so   We will reach out soon   In meantime if pain gets worse or if you have fever or new symptoms call asap

## 2023-11-07 NOTE — Progress Notes (Signed)
 Subjective:    Patient ID: Connie Rowe, female    DOB: 05-13-1950, 73 y.o.   MRN: [id number]  HPI  Wt Readings from Last 3 Encounters:  11/07/23 157 lb 8 oz (71.4 kg)  10/31/23 159 lb 8 oz (72.3 kg)  10/25/23 159 lb 6.4 oz (72.3 kg)   27.46 kg/m  Vitals:   11/07/23 1029  BP: 122/76  Pulse: 69  Temp: 98.1 F (36.7 C)  SpO2: 97%   Pt presents for follow up of LLQ abd pain with constipation   Last visit LLQ pain     For several months In setting of constipation worsened after taking codeine  cough medicine  Last colonsocopy 11/2020- reassuring (does have diverticulosis)  Mother had colon cancer at 60    Some tenderness on exam , no rebound or acute  Wbc normal on last /recent cbc (was having pain at the time)    Will treat constipation with miralax - TID to start until bowels start to move then titrate to need  Follow up 1 week Low threshold to image      Is using miralax   Did clean her out  Still has a lot of gas and pressure  Unsure if improved  No fever  Tender in LLQ - can radiate to buttocks   No nausea or vomiting No blood in stool   Lots of lifting  -in house / ? If pulled something    Lab Results  Component Value Date   NA 141 10/24/2023   K 4.0 10/24/2023   CO2 29 10/24/2023   GLUCOSE 183 (H) 10/24/2023   BUN 20 10/24/2023   CREATININE 1.94 (H) 10/24/2023   CALCIUM 9.1 10/24/2023   GFR 24.93 (L) 10/24/2023   EGFR 37.0 03/02/2023   GFRNONAA 48 (L) 09/14/2021   Lab Results  Component Value Date   ALT 10 10/24/2023   AST 13 10/24/2023   ALKPHOS 81 10/24/2023   BILITOT 0.4 10/24/2023   Lab Results  Component Value Date   WBC 4.4 10/24/2023   HGB 12.3 10/24/2023   HCT 38.1 10/24/2023   MCV 89.6 10/24/2023   PLT 242.0 10/24/2023    Pt was told by nephrology that contrast is ok for CT scan   Had CT of neck recently with contrast and her nephrologist said it was ok     Patient Active Problem List   Diagnosis Date Noted   LLQ  pain 10/31/2023   Pulsatile tinnitus of right ear 01/11/2023   CKD (chronic kidney disease) stage 4, GFR 15-29 ml/min (HCC) 09/26/2022   Current use of proton pump inhibitor 09/18/2022   Pain in joints of right hand 08/24/2022   Digital mucinous cyst of finger 08/24/2022   Seborrheic keratoses 08/24/2022   Screening mammogram for breast cancer 10/10/2021   Family hx of colon cancer 05/07/2020   Cervical disc disease 08/19/2019   Dysphagia 10/14/2018   Allergic rhinitis 09/03/2017   Insomnia 11/27/2016   Daytime somnolence 05/23/2016   Snoring 05/23/2016   Estrogen deficiency 10/19/2015   Need for hepatitis C screening test 09/27/2015   S/P right THA, AA 03/30/2015   Pre-operative examination 02/24/2015   S/P lumbar laminectomy 09/23/2014   Essential hypertension 06/26/2014   Encounter for Medicare annual wellness exam 06/03/2014   Colon cancer screening 06/03/2014   Acute sinusitis 02/10/2014   Sciatica 11/26/2013   Prediabetes 04/17/2013   Hirsutism 12/06/2010   Routine general medical examination at a health care facility 09/10/2010   Other  screening mammogram 09/01/2010   Osteopenia 10/22/2006   URINARY INCONTINENCE, STRESS 08/22/2006   History of Helicobacter pylori infection 04/18/2006   Hyperlipidemia 04/18/2006   Depression with anxiety 04/18/2006   External hemorrhoids 04/18/2006   GERD 04/18/2006   Diaphragmatic hernia 04/18/2006   OVERACTIVE BLADDER 04/18/2006   Hematuria 04/18/2006   History of digestive system disease 04/18/2006   Diverticulosis of colon 01/02/2000   Past Medical History:  Diagnosis Date   Allergy    Anxiety    Arthritis    CKD (chronic kidney disease) stage 4, GFR 15-29 ml/min (HCC) 09/26/2022   Colon polyps    Depression    Diabetes mellitus without complication (HCC)    Esophageal stricture    Falls    Pt denies   GERD (gastroesophageal reflux disease)    H/O fracture of humerus 01/31/2004   History of bronchitis    History of  hiatal hernia    Hyperlipidemia    Hypertension    Nocturia    Osteopenia    Pancreatitis    History of   Sleep apnea    uses CPAP   Wears glasses    reading glasses  04/06/2022   Past Surgical History:  Procedure Laterality Date   BLADDER REPAIR     COLONOSCOPY     CYSTOSCOPY W/ RETROGRADES Bilateral 04/11/2022   Procedure: CYSTOSCOPY WITH RETROGRADE PYELOGRAM;  Surgeon: Rosalind Zachary NOVAK, MD;  Location: [facility];  Service: Urology;  Laterality: Bilateral;   CYSTOSCOPY WITH BIOPSY N/A 04/11/2022   Procedure: CYSTOSCOPY WITH BIOPSY AND FULGERATION;  Surgeon: Rosalind Zachary NOVAK, MD;  Location: [facility];  Service: Urology;  Laterality: N/A;   DIAGNOSTIC LAPAROSCOPY     tubal ligation   DILATION AND CURETTAGE OF UTERUS     ESOPHAGEAL DILATION     JOINT REPLACEMENT     LUMBAR LAMINECTOMY/DECOMPRESSION MICRODISCECTOMY Right 09/23/2014   Procedure: Laminectomy and Foraminotomy - Lumbar three- lumbar four - right ;  Surgeon: Alm GORMAN Molt, MD;  Location: [facility];  Service: Neurosurgery;  Laterality: Right;   MULTIPLE TOOTH EXTRACTIONS     TONSILLECTOMY     TOTAL HIP ARTHROPLASTY Right 03/30/2015   Procedure: RIGHT TOTAL HIP ARTHROPLASTY ANTERIOR APPROACH;  Surgeon: Donnice Car, MD;  Location: [facility];  Service: Orthopedics;  Laterality: Right;   TOTAL HIP ARTHROPLASTY Left 01/09/2017   Procedure: LEFT TOTAL HIP ARTHROPLASTY ANTERIOR APPROACH;  Surgeon: Car Donnice, MD;  Location: [facility];  Service: Orthopedics;  Laterality: Left;  70 mins   TUBAL LIGATION     UPPER GASTROINTESTINAL ENDOSCOPY     UPPER GI ENDOSCOPY     Social History   Tobacco Use   Smoking status: Never   Smokeless tobacco: Never  Vaping Use   Vaping status: Never Used  Substance Use Topics   Alcohol  use: Yes    Comment: very rarely   Drug use: No   Family History  Problem Relation Age of Onset   Stroke Mother    Hypertension Mother    Colon cancer Mother     Stroke Brother    Drug abuse Son    Colon polyps Neg Hx    Crohn's disease Neg Hx    Pancreatic cancer Neg Hx    Rectal cancer Neg Hx    Stomach cancer Neg Hx    Ulcerative colitis Neg Hx    Breast cancer Neg Hx    Esophageal cancer Neg Hx    Allergies  Allergen  Reactions   Ace Inhibitors Cough   Ambien  [Zolpidem  Tartrate] Other (See Comments)    Affected memory   Current Outpatient Medications on File Prior to Visit  Medication Sig Dispense Refill   amLODipine  (NORVASC ) 10 MG tablet Take 10 mg by mouth daily.     Calcium Carbonate-Vitamin D  (CALCIUM-VITAMIN D ) 500-200 MG-UNIT per tablet Take 1 tablet by mouth daily.     diphenhydrAMINE  (BENADRYL ) 25 mg capsule Take 25 mg by mouth every 6 (six) hours as needed.     FLUoxetine  (PROZAC ) 40 MG capsule TAKE 1 CAPSULE EVERY DAY 90 capsule 0   fluticasone  (FLONASE ) 50 MCG/ACT nasal spray USE 2 SPRAYS IN EACH NOSTRIL EVERY DAY 48 g 3   guaiFENesin -codeine  100-10 MG/5ML syrup Take 5 mLs by mouth 3 (three) times daily as needed. 120 mL 0   JARDIANCE 25 MG TABS tablet Take 25 mg by mouth daily.     Multiple Vitamins-Minerals (MULTIVITAMIN PO) Take 1 tablet by mouth daily.     pantoprazole  (PROTONIX ) 40 MG tablet TAKE 1 TABLET EVERY DAY 90 tablet 3   pravastatin  (PRAVACHOL ) 80 MG tablet TAKE 1 TABLET EVERY DAY 90 tablet 0   traZODone  (DESYREL ) 50 MG tablet TAKE 1/2 TO 1 TABLET AT BEDTIME AS NEEDED FOR SLEEP 90 tablet 1   No current facility-administered medications on file prior to visit.    Review of Systems  Gastrointestinal:  Positive for abdominal pain. Negative for abdominal distention, anal bleeding, blood in stool, constipation, diarrhea, nausea, rectal pain and vomiting.       Gas and pressure feeling in abdomen Not distended        Objective:   Physical Exam Constitutional:      General: She is not in acute distress.    Appearance: Normal appearance. She is well-developed and normal weight. She is not ill-appearing or  diaphoretic.  HENT:     Head: Normocephalic and atraumatic.  Eyes:     Conjunctiva/sclera: Conjunctivae normal.     Pupils: Pupils are equal, round, and reactive to light.  Neck:     Thyroid : No thyromegaly.     Vascular: No carotid bruit or JVD.  Cardiovascular:     Rate and Rhythm: Normal rate and regular rhythm.     Heart sounds: Normal heart sounds.     No gallop.  Pulmonary:     Effort: Pulmonary effort is normal. No respiratory distress.     Breath sounds: Normal breath sounds. No wheezing or rales.  Abdominal:     General: Abdomen is protuberant. Bowel sounds are normal. There is no distension or abdominal bruit.     Palpations: Abdomen is soft. There is no shifting dullness, fluid wave, hepatomegaly, splenomegaly, mass or pulsatile mass.     Tenderness: There is abdominal tenderness in the left lower quadrant. There is no right CVA tenderness, left CVA tenderness, guarding or rebound. Negative signs include Murphy's sign and McBurney's sign.     Hernia: No hernia is present.  Musculoskeletal:     Cervical back: Normal range of motion and neck supple.     Right lower leg: No edema.     Left lower leg: No edema.  Lymphadenopathy:     Cervical: No cervical adenopathy.  Skin:    General: Skin is warm and dry.     Coloration: Skin is not pale.     Findings: No rash.  Neurological:     Mental Status: She is alert.     Coordination: Coordination normal.  Deep Tendon Reflexes: Reflexes are normal and symmetric. Reflexes normal.  Psychiatric:        Mood and Affect: Mood normal.           Assessment & Plan:   Problem List Items Addressed This Visit       Digestive   Diverticulosis of colon   Noted on last colonoscopy   Pt's LLQ pain continues despite resolution of constipation  Diverticulitis is in the differential         Other   LLQ pain - Primary   Given nature/location and known diverticulosis-suspect diverticulitis Stable exam today No improvement  with resolution of constipation   Will treat with augmentin   Would like to get CT scan w/contrast if safe in setting of CKD   Call back and Er precautions noted in detail today

## 2023-11-07 NOTE — Assessment & Plan Note (Signed)
 Noted on last colonoscopy   Pt's LLQ pain continues despite resolution of constipation  Diverticulitis is in the differential

## 2023-11-07 NOTE — Assessment & Plan Note (Signed)
 Given nature/location and known diverticulosis-suspect diverticulitis Stable exam today No improvement with resolution of constipation   Will treat with augmentin   Would like to get CT scan w/contrast if safe in setting of CKD   Call back and Er precautions noted in detail today

## 2023-11-08 ENCOUNTER — Other Ambulatory Visit: Payer: Self-pay | Admitting: Family Medicine

## 2023-11-14 ENCOUNTER — Telehealth: Payer: Self-pay | Admitting: Family Medicine

## 2023-11-14 NOTE — Telephone Encounter (Signed)
 Please check in with patient and see how her GI/diverticulitis symptoms are on the augmentin ? Any improvement?   Thanks

## 2023-11-15 NOTE — Telephone Encounter (Signed)
 Left message to return call to our office.

## 2023-11-16 ENCOUNTER — Other Ambulatory Visit: Payer: Self-pay | Admitting: Family Medicine

## 2023-11-16 DIAGNOSIS — Z1231 Encounter for screening mammogram for malignant neoplasm of breast: Secondary | ICD-10-CM

## 2023-11-16 NOTE — Telephone Encounter (Signed)
 Lvm asking pt to call back. Need to get update on GI sxs.

## 2023-11-16 NOTE — Telephone Encounter (Signed)
 Copied from CRM #[id number]. Topic: General - Other >> Nov 16, 2023  2:17 PM Franky GRADE wrote: Reason for CRM: Patient is returning a call from Rattan, I called CAL and she was not available. I asked patient how she was feeling with the diverticulitis symptoms, Patient stated she is doing better, at times she still feels some pain but not as bad as before.

## 2023-11-18 NOTE — Telephone Encounter (Signed)
 Thanks for letting me know  I would like to avoid contrast /CT if possible with kidneys Please schedule follow up for re check   Call if symptoms worsen before than

## 2023-11-19 NOTE — Telephone Encounter (Signed)
 Pt notified of Dr. Graham comments and f/u appt scheduled. Pt will call back if sxs worsen

## 2023-11-27 ENCOUNTER — Ambulatory Visit: Admitting: Family Medicine

## 2023-11-27 NOTE — Progress Notes (Deleted)
 Subjective:    Patient ID: Connie Rowe, female    DOB: 11-11-1950, 73 y.o.   MRN: [id number]  HPI  Wt Readings from Last 3 Encounters:  11/07/23 157 lb 8 oz (71.4 kg)  10/31/23 159 lb 8 oz (72.3 kg)  10/25/23 159 lb 6.4 oz (72.3 kg)      There were no vitals filed for this visit.  Pt presents for follow up of  GI symptoms/ diverticulitis   Last visit treated with augmentin  for presumed diverticulitis  This did follow a bout of constipation with discomfort -used miralax   Held off on Ct with contrast due to CKD  Instructed to avoid nuts/seeds/corn/popcorn      Lab Results  Component Value Date   NA 141 10/24/2023   K 4.0 10/24/2023   CO2 29 10/24/2023   GLUCOSE 183 (H) 10/24/2023   BUN 20 10/24/2023   CREATININE 1.94 (H) 10/24/2023   CALCIUM 9.1 10/24/2023   GFR 24.93 (L) 10/24/2023   EGFR 37.0 03/02/2023   GFRNONAA 48 (L) 09/14/2021    Lab Results  Component Value Date   WBC 4.4 10/24/2023   HGB 12.3 10/24/2023   HCT 38.1 10/24/2023   MCV 89.6 10/24/2023   PLT 242.0 10/24/2023       Patient Active Problem List   Diagnosis Date Noted   LLQ pain 10/31/2023   Pulsatile tinnitus of right ear 01/11/2023   CKD (chronic kidney disease) stage 4, GFR 15-29 ml/min (HCC) 09/26/2022   Current use of proton pump inhibitor 09/18/2022   Pain in joints of right hand 08/24/2022   Digital mucinous cyst of finger 08/24/2022   Seborrheic keratoses 08/24/2022   Screening mammogram for breast cancer 10/10/2021   Family hx of colon cancer 05/07/2020   Cervical disc disease 08/19/2019   Dysphagia 10/14/2018   Allergic rhinitis 09/03/2017   Insomnia 11/27/2016   Daytime somnolence 05/23/2016   Snoring 05/23/2016   Estrogen deficiency 10/19/2015   Need for hepatitis C screening test 09/27/2015   S/P right THA, AA 03/30/2015   Pre-operative examination 02/24/2015   S/P lumbar laminectomy 09/23/2014   Essential hypertension 06/26/2014   Encounter for Medicare  annual wellness exam 06/03/2014   Colon cancer screening 06/03/2014   Acute sinusitis 02/10/2014   Sciatica 11/26/2013   Prediabetes 04/17/2013   Hirsutism 12/06/2010   Routine general medical examination at a health care facility 09/10/2010   Other screening mammogram 09/01/2010   Osteopenia 10/22/2006   URINARY INCONTINENCE, STRESS 08/22/2006   History of Helicobacter pylori infection 04/18/2006   Hyperlipidemia 04/18/2006   Depression with anxiety 04/18/2006   External hemorrhoids 04/18/2006   GERD 04/18/2006   Diaphragmatic hernia 04/18/2006   OVERACTIVE BLADDER 04/18/2006   Hematuria 04/18/2006   History of digestive system disease 04/18/2006   Diverticulosis of colon 01/02/2000   Past Medical History:  Diagnosis Date   Allergy    Anxiety    Arthritis    CKD (chronic kidney disease) stage 4, GFR 15-29 ml/min (HCC) 09/26/2022   Colon polyps    Depression    Diabetes mellitus without complication (HCC)    Esophageal stricture    Falls    Pt denies   GERD (gastroesophageal reflux disease)    H/O fracture of humerus 01/31/2004   History of bronchitis    History of hiatal hernia    Hyperlipidemia    Hypertension    Nocturia    Osteopenia    Pancreatitis    History of  Sleep apnea    uses CPAP   Wears glasses    reading glasses  04/06/2022   Past Surgical History:  Procedure Laterality Date   BLADDER REPAIR     COLONOSCOPY     CYSTOSCOPY W/ RETROGRADES Bilateral 04/11/2022   Procedure: CYSTOSCOPY WITH RETROGRADE PYELOGRAM;  Surgeon: Rosalind Zachary NOVAK, MD;  Location: [facility];  Service: Urology;  Laterality: Bilateral;   CYSTOSCOPY WITH BIOPSY N/A 04/11/2022   Procedure: CYSTOSCOPY WITH BIOPSY AND FULGERATION;  Surgeon: Rosalind Zachary NOVAK, MD;  Location: [facility];  Service: Urology;  Laterality: N/A;   DIAGNOSTIC LAPAROSCOPY     tubal ligation   DILATION AND CURETTAGE OF UTERUS     ESOPHAGEAL DILATION     JOINT REPLACEMENT      LUMBAR LAMINECTOMY/DECOMPRESSION MICRODISCECTOMY Right 09/23/2014   Procedure: Laminectomy and Foraminotomy - Lumbar three- lumbar four - right ;  Surgeon: Alm GORMAN Molt, MD;  Location: [facility];  Service: Neurosurgery;  Laterality: Right;   MULTIPLE TOOTH EXTRACTIONS     TONSILLECTOMY     TOTAL HIP ARTHROPLASTY Right 03/30/2015   Procedure: RIGHT TOTAL HIP ARTHROPLASTY ANTERIOR APPROACH;  Surgeon: Donnice Car, MD;  Location: [facility];  Service: Orthopedics;  Laterality: Right;   TOTAL HIP ARTHROPLASTY Left 01/09/2017   Procedure: LEFT TOTAL HIP ARTHROPLASTY ANTERIOR APPROACH;  Surgeon: Car Donnice, MD;  Location: [facility];  Service: Orthopedics;  Laterality: Left;  70 mins   TUBAL LIGATION     UPPER GASTROINTESTINAL ENDOSCOPY     UPPER GI ENDOSCOPY     Social History   Tobacco Use   Smoking status: Never   Smokeless tobacco: Never  Vaping Use   Vaping status: Never Used  Substance Use Topics   Alcohol  use: Yes    Comment: very rarely   Drug use: No   Family History  Problem Relation Age of Onset   Stroke Mother    Hypertension Mother    Colon cancer Mother    Stroke Brother    Drug abuse Son    Colon polyps Neg Hx    Crohn's disease Neg Hx    Pancreatic cancer Neg Hx    Rectal cancer Neg Hx    Stomach cancer Neg Hx    Ulcerative colitis Neg Hx    Breast cancer Neg Hx    Esophageal cancer Neg Hx    Allergies  Allergen Reactions   Ace Inhibitors Cough   Ambien  [Zolpidem  Tartrate] Other (See Comments)    Affected memory   Current Outpatient Medications on File Prior to Visit  Medication Sig Dispense Refill   amLODipine  (NORVASC ) 10 MG tablet Take 10 mg by mouth daily.     amoxicillin -clavulanate (AUGMENTIN ) 875-125 MG tablet Take 1 tablet by mouth 2 (two) times daily. 20 tablet 0   Calcium Carbonate-Vitamin D  (CALCIUM-VITAMIN D ) 500-200 MG-UNIT per tablet Take 1 tablet by mouth daily.     diphenhydrAMINE  (BENADRYL ) 25 mg capsule Take 25 mg by mouth every 6 (six)  hours as needed.     FLUoxetine  (PROZAC ) 40 MG capsule TAKE 1 CAPSULE EVERY DAY 90 capsule 1   fluticasone  (FLONASE ) 50 MCG/ACT nasal spray USE 2 SPRAYS IN EACH NOSTRIL EVERY DAY 48 g 3   guaiFENesin -codeine  100-10 MG/5ML syrup Take 5 mLs by mouth 3 (three) times daily as needed. 120 mL 0   JARDIANCE 25 MG TABS tablet Take 25 mg by mouth daily.     Multiple Vitamins-Minerals (MULTIVITAMIN PO) Take 1  tablet by mouth daily.     pantoprazole  (PROTONIX ) 40 MG tablet TAKE 1 TABLET EVERY DAY 90 tablet 3   pravastatin  (PRAVACHOL ) 80 MG tablet TAKE 1 TABLET EVERY DAY 90 tablet 0   traZODone  (DESYREL ) 50 MG tablet TAKE 1/2 TO 1 TABLET AT BEDTIME AS NEEDED FOR SLEEP 90 tablet 1   No current facility-administered medications on file prior to visit.    Review of Systems     Objective:   Physical Exam        Assessment & Plan:   Problem List Items Addressed This Visit   None

## 2023-12-05 DIAGNOSIS — N1831 Chronic kidney disease, stage 3a: Secondary | ICD-10-CM | POA: Diagnosis not present

## 2023-12-05 DIAGNOSIS — E1122 Type 2 diabetes mellitus with diabetic chronic kidney disease: Secondary | ICD-10-CM | POA: Diagnosis not present

## 2023-12-05 DIAGNOSIS — I129 Hypertensive chronic kidney disease with stage 1 through stage 4 chronic kidney disease, or unspecified chronic kidney disease: Secondary | ICD-10-CM | POA: Diagnosis not present

## 2023-12-05 DIAGNOSIS — N184 Chronic kidney disease, stage 4 (severe): Secondary | ICD-10-CM | POA: Diagnosis not present

## 2023-12-05 DIAGNOSIS — N2581 Secondary hyperparathyroidism of renal origin: Secondary | ICD-10-CM | POA: Diagnosis not present

## 2023-12-05 DIAGNOSIS — E785 Hyperlipidemia, unspecified: Secondary | ICD-10-CM | POA: Diagnosis not present

## 2023-12-05 DIAGNOSIS — D631 Anemia in chronic kidney disease: Secondary | ICD-10-CM | POA: Diagnosis not present

## 2023-12-05 DIAGNOSIS — N189 Chronic kidney disease, unspecified: Secondary | ICD-10-CM | POA: Diagnosis not present

## 2023-12-05 LAB — LAB REPORT - SCANNED
A1c: 5.8
Albumin, Urine POC: 30.8
Creatinine, POC: 95.9 mg/dL
EGFR: 26
Microalb Creat Ratio: 32

## 2023-12-07 ENCOUNTER — Other Ambulatory Visit: Payer: Self-pay | Admitting: Family Medicine

## 2023-12-15 ENCOUNTER — Other Ambulatory Visit: Payer: Self-pay | Admitting: Family Medicine

## 2023-12-19 DIAGNOSIS — J019 Acute sinusitis, unspecified: Secondary | ICD-10-CM | POA: Diagnosis not present

## 2023-12-19 DIAGNOSIS — Z03818 Encounter for observation for suspected exposure to other biological agents ruled out: Secondary | ICD-10-CM | POA: Diagnosis not present

## 2023-12-19 DIAGNOSIS — R490 Dysphonia: Secondary | ICD-10-CM | POA: Diagnosis not present

## 2023-12-19 DIAGNOSIS — B9689 Other specified bacterial agents as the cause of diseases classified elsewhere: Secondary | ICD-10-CM | POA: Diagnosis not present

## 2023-12-20 ENCOUNTER — Ambulatory Visit
Admission: RE | Admit: 2023-12-20 | Discharge: 2023-12-20 | Disposition: A | Discharge status: Home / Self Care | Source: Ambulatory Visit | Attending: Family Medicine | Admitting: Family Medicine

## 2023-12-20 DIAGNOSIS — Z1231 Encounter for screening mammogram for malignant neoplasm of breast: Secondary | ICD-10-CM | POA: Insufficient documentation

## 2023-12-26 ENCOUNTER — Ambulatory Visit: Payer: Self-pay | Admitting: Family Medicine

## 2024-01-15 ENCOUNTER — Ambulatory Visit
Admission: RE | Admit: 2024-01-15 | Discharge: 2024-01-15 | Disposition: A | Source: Ambulatory Visit | Attending: Student

## 2024-01-15 ENCOUNTER — Other Ambulatory Visit: Payer: Self-pay | Admitting: Student

## 2024-01-15 DIAGNOSIS — R059 Cough, unspecified: Secondary | ICD-10-CM

## 2024-01-29 ENCOUNTER — Emergency Department
Admission: EM | Admit: 2024-01-29 | Discharge: 2024-01-29 | Disposition: A | Discharge status: Home / Self Care | Attending: Emergency Medicine | Admitting: Emergency Medicine

## 2024-01-29 ENCOUNTER — Emergency Department

## 2024-01-29 DIAGNOSIS — I129 Hypertensive chronic kidney disease with stage 1 through stage 4 chronic kidney disease, or unspecified chronic kidney disease: Secondary | ICD-10-CM | POA: Insufficient documentation

## 2024-01-29 DIAGNOSIS — R34 Anuria and oliguria: Secondary | ICD-10-CM | POA: Diagnosis not present

## 2024-01-29 DIAGNOSIS — R739 Hyperglycemia, unspecified: Secondary | ICD-10-CM | POA: Diagnosis not present

## 2024-01-29 DIAGNOSIS — R1031 Right lower quadrant pain: Secondary | ICD-10-CM | POA: Insufficient documentation

## 2024-01-29 DIAGNOSIS — N189 Chronic kidney disease, unspecified: Secondary | ICD-10-CM | POA: Diagnosis not present

## 2024-01-29 DIAGNOSIS — R10A1 Flank pain, right side: Secondary | ICD-10-CM | POA: Diagnosis present

## 2024-01-29 LAB — COMPREHENSIVE METABOLIC PANEL WITH GFR
ALT: 16 U/L (ref 0–44)
AST: 23 U/L (ref 15–41)
Albumin: 4.3 g/dL (ref 3.5–5.0)
Alkaline Phosphatase: 88 U/L (ref 38–126)
Anion gap: 12 (ref 5–15)
BUN: 21 mg/dL (ref 8–23)
CO2: 25 mmol/L (ref 22–32)
Calcium: 9.8 mg/dL (ref 8.9–10.3)
Chloride: 102 mmol/L (ref 98–111)
Creatinine, Ser: 1.87 mg/dL — ABNORMAL HIGH (ref 0.44–1.00)
GFR, Estimated: 28 mL/min — ABNORMAL LOW
Glucose, Bld: 178 mg/dL — ABNORMAL HIGH (ref 70–99)
Potassium: 4.3 mmol/L (ref 3.5–5.1)
Sodium: 139 mmol/L (ref 135–145)
Total Bilirubin: 0.3 mg/dL (ref 0.0–1.2)
Total Protein: 7.4 g/dL (ref 6.5–8.1)

## 2024-01-29 LAB — URINALYSIS, ROUTINE W REFLEX MICROSCOPIC
Bacteria, UA: NONE SEEN
Bilirubin Urine: NEGATIVE
Glucose, UA: >=500 mg/dL — AB
Ketones, ur: NEGATIVE mg/dL
Leukocytes,Ua: NEGATIVE
Nitrite: NEGATIVE
Protein, ur: NEGATIVE mg/dL
Specific Gravity, Urine: 1.022 (ref 1.005–1.030)
pH: 5 (ref 5.0–8.0)

## 2024-01-29 LAB — CBC
HCT: 43.1 % (ref 36.0–46.0)
Hemoglobin: 13.4 g/dL (ref 12.0–15.0)
MCH: 28.8 pg (ref 26.0–34.0)
MCHC: 31.1 g/dL (ref 30.0–36.0)
MCV: 92.7 fL (ref 80.0–100.0)
Platelets: 248 K/uL (ref 150–400)
RBC: 4.65 MIL/uL (ref 3.87–5.11)
RDW: 14.1 % (ref 11.5–15.5)
WBC: 5.8 K/uL (ref 4.0–10.5)
nRBC: 0 % (ref 0.0–0.2)

## 2024-01-29 MED ORDER — ONDANSETRON 4 MG PO TBDP
4.0000 mg | ORAL_TABLET | Freq: Once | ORAL | Status: AC
  Administered 2024-01-29: 4 mg via ORAL
  Filled 2024-01-29: qty 1

## 2024-01-29 MED ORDER — HYDROCODONE-ACETAMINOPHEN 5-325 MG PO TABS: 1.0000 | ORAL_TABLET | Freq: Three times a day (TID) | ORAL | 0 refills | Status: AC | PRN

## 2024-01-29 MED ORDER — HYDROCODONE-ACETAMINOPHEN 5-325 MG PO TABS
1.0000 | ORAL_TABLET | Freq: Once | ORAL | Status: AC
  Administered 2024-01-29: 1 via ORAL
  Filled 2024-01-29: qty 1

## 2024-01-29 NOTE — ED Triage Notes (Signed)
 Pt presents to the ED via POV from home with right sided pain that started 12/24. Pt reports hx of kidney disease. Reports slow urination. Denies N/V/D. Denies hx of kidney stones. Pt A&Ox4. Ambulatory to triage room without difficulty

## 2024-01-29 NOTE — ED Provider Notes (Signed)
 "   [facility] Emergency Department Provider Note     Event Date/Time   First MD Initiated Contact with Patient 01/29/24 1744     (approximate)   History   Flank Pain   HPI  Connie Rowe is a 73 y.o. female with a history of CKD, GERD, HLD, HTN, B hip arthroplasty, and anxiety, presents to the ED from home.  She endorses right-sided flank pain that began on Christmas Eve.  She has a history of chronic kidney disease and slow urination but denies any history of kidney stones.  She presents to the ED denies any nausea, vomiting, or diarrhea.  No dysuria, hematuria reported.  Patient denies any fever, chills, or sweats.     Physical Exam   Triage Vital Signs: ED Triage Vitals  Encounter Vitals Group     BP 01/29/24 1602 (!) 111/90     Girls Systolic BP Percentile --      Girls Diastolic BP Percentile --      Boys Systolic BP Percentile --      Boys Diastolic BP Percentile --      Pulse Rate 01/29/24 1602 87     Resp 01/29/24 1602 18     Temp 01/29/24 1602 97.8 F (36.6 C)     Temp Source 01/29/24 1602 Oral     SpO2 01/29/24 1602 98 %     Weight 01/29/24 1603 160 lb (72.6 kg)     Height 01/29/24 1603 5' 4 (1.626 m)     Head Circumference --      Peak Flow --      Pain Score 01/29/24 1602 8     Pain Loc --      Pain Education --      Exclude from Growth Chart --     Most recent vital signs: Vitals:   01/29/24 1751 01/29/24 2300  BP:  (!) 141/82  Pulse:  84  Resp:  18  Temp:    SpO2: 100% 100%    General Awake, no distress.  NAD HEENT NCAT. PERRL. EOMI. No rhinorrhea. Mucous membranes are moist.  CV:  Good peripheral perfusion. RRR RESP:  Normal effort. CTA ABD:  No distention.  Tender palpation to the right lower quadrant.  Some mild right CVA tenderness elicited.  No rebound, guarding, or rigidity noted.  Normal bowel sounds x 4.  ED Results / Procedures / Treatments   Labs (all labs ordered are listed, but only abnormal  results are displayed) Labs Reviewed  COMPREHENSIVE METABOLIC PANEL WITH GFR - Abnormal; Notable for the following components:      Result Value   Glucose, Bld 178 (*)    Creatinine, Ser 1.87 (*)    GFR, Estimated 28 (*)    All other components within normal limits  URINALYSIS, ROUTINE W REFLEX MICROSCOPIC - Abnormal; Notable for the following components:   Color, Urine YELLOW (*)    APPearance CLEAR (*)    Glucose, UA >=500 (*)    Hgb urine dipstick SMALL (*)    All other components within normal limits  CBC     EKG    RADIOLOGY  I personally viewed and evaluated these images as part of my medical decision making, as well as reviewing the written report by the radiologist.  ED Provider Interpretation: No acute intra-abdominal process to explain patient's symptoms.  Incidental left lower lobe nodule  CT ABDOMEN PELVIS WO CONTRAST Result Date: 01/29/2024 CLINICAL DATA:  Abdominal/flank pain. EXAM:  CT ABDOMEN AND PELVIS WITHOUT CONTRAST TECHNIQUE: Multidetector CT imaging of the abdomen and pelvis was performed following the standard protocol without IV contrast. RADIATION DOSE REDUCTION: This exam was performed according to the departmental dose-optimization program which includes automated exposure control, adjustment of the mA and/or kV according to patient size and/or use of iterative reconstruction technique. COMPARISON:  CT abdomen pelvis dated 03/24/2022. FINDINGS: Evaluation of this exam is limited in the absence of intravenous contrast. Lower chest: A 1 cm left lung base nodule or nodular scarring (3/4). No intra-abdominal free air or free fluid. Hepatobiliary: The liver is unremarkable no biliary dilatation. The gallbladder is unremarkable Pancreas: Unremarkable. No pancreatic ductal dilatation or surrounding inflammatory changes. Spleen: Normal in size without focal abnormality. Adrenals/Urinary Tract: The adrenal glands are unremarkable there is no hydronephrosis or  nephrolithiasis on either side. The visualized ureters appear unremarkable. The urinary bladder is poorly visualized due to streak artifact caused by bilateral total hip arthroplasties. Stomach/Bowel: There is sigmoid diverticulosis. There is moderate stool throughout the colon. There is no bowel obstruction or active inflammation. The appendix is normal. Vascular/Lymphatic: Mild aortoiliac atherosclerotic disease. The IVC is unremarkable. No portal venous gas. There is no adenopathy. Reproductive: Hysterectomy. Other: None Musculoskeletal: Degenerative changes of the spine. Bilateral total hip arthroplasties. No acute osseous pathology. IMPRESSION: 1. No acute intra-abdominal or pelvic pathology. No hydronephrosis or nephrolithiasis. 2. Sigmoid diverticulosis. No bowel obstruction. Normal appendix. 3. A 1 cm left lung base nodule or nodular scarring. Consider one of the following in 3 months for both low-risk and high-risk individuals: (a) repeat chest CT, (b) follow-up PET-CT, or (c) tissue sampling. This recommendation follows the consensus statement: Guidelines for Management of Incidental Pulmonary Nodules Detected on CT Images: From the Fleischner Society 2017; Radiology 2017; 284:228-243. 4.  Aortic Atherosclerosis (ICD10-I70.0). Electronically Signed   By: Vanetta Chou M.D.   On: 01/29/2024 19:55     PROCEDURES:  Critical Care performed: No  Procedures   MEDICATIONS ORDERED IN ED: Medications  HYDROcodone -acetaminophen  (NORCO/VICODIN) 5-325 MG per tablet 1 tablet (1 tablet Oral Given 01/29/24 2051)  ondansetron  (ZOFRAN -ODT) disintegrating tablet 4 mg (4 mg Oral Given 01/29/24 2051)     IMPRESSION / MDM / ASSESSMENT AND PLAN / ED COURSE  I reviewed the triage vital signs and the nursing notes.                              Differential diagnosis includes, but is not limited to, ovarian cyst, ovarian torsion, acute appendicitis, diverticulitis, urinary tract infection/pyelonephritis,  endometriosis, bowel obstruction, colitis, renal colic, gastroenteritis, hernia, fibroids, endometriosis, pregnancy related pain including ectopic pregnancy, etc.   Patient's presentation is most consistent with acute complicated illness / injury requiring diagnostic workup.  Patient's diagnosis is consistent with right lower quadrant abdominal pain with an unclear etiology.  Geriatric patient presents to the ED for evaluation of right lower quadrant pain as well as flank pain with an unclear etiology.  She was endorsed nearly week of symptoms, that been persistent.  She is also endorsing some decreased urinary output, but denies any frank dysuria, hematuria, urinary retention.  Labs are largely unremarkable on presentation.  CT images reviewed by me, show no acute intra-abdominal or pelvic process to explain the patient's symptoms.  Incidental finding of a LLL pulmonary nodule requiring nonemergent outpatient follow-up was discussed with the patient and her husband.  Copy of the CT scan also provided for further evaluation.  We initially placed an order for a pelvic ultrasound to evaluate the patient's symptoms, but due to the protracted wait, the patient decided to discharge from the ED at this time.  She is endorsing significant movement of her pain after hydrocodone  and Zofran  provided.  I do feel the patient stable for outpatient management at this time.  Patient will be discharged home with prescriptions for codon. Patient is to follow up with her PCP as needed or otherwise directed. Patient is given ED precautions to return to the ED for any worsening or new symptoms.   FINAL CLINICAL IMPRESSION(S) / ED DIAGNOSES   Final diagnoses:  Abdominal pain, RLQ     Rx / DC Orders   ED Discharge Orders          Ordered    HYDROcodone -acetaminophen  (NORCO/VICODIN) 5-325 MG tablet  3 times daily PRN        01/29/24 2256             Note:  This document was prepared using Dragon voice  recognition software and may include unintentional dictation errors.    Loyd Candida LULLA Aldona, PA-C 01/29/24 2358  "

## 2024-01-29 NOTE — Discharge Instructions (Signed)
 Your exam, labs, and CT scan are overall normal and reassuring.  No indication of the CT scan for the exact cause of your right lower quadrant and flank pain.  No indication of any kidney stones, kidney infection, appendicitis, or colitis.  We were unable to get your pelvic ultrasound performed to further evaluate your pelvic pain.  You should follow with your primary provider for ongoing evaluation management.  Take prescription pain medicine as needed.  Return to the ED necessary.

## 2024-06-04 ENCOUNTER — Ambulatory Visit
